# Patient Record
Sex: Female | Born: 1985 | Race: Black or African American | Hispanic: No | Marital: Married | Smoking: Never smoker
Health system: Southern US, Community
[De-identification: ages and names within clinical notes are randomized; demographics above are authoritative.]

## PROBLEM LIST (undated history)

## (undated) ENCOUNTER — Inpatient Hospital Stay (HOSPITAL_COMMUNITY): Payer: Self-pay

## (undated) DIAGNOSIS — Z8632 Personal history of gestational diabetes: Secondary | ICD-10-CM

## (undated) DIAGNOSIS — Z8759 Personal history of other complications of pregnancy, childbirth and the puerperium: Secondary | ICD-10-CM

## (undated) DIAGNOSIS — M1990 Unspecified osteoarthritis, unspecified site: Secondary | ICD-10-CM

## (undated) DIAGNOSIS — J45909 Unspecified asthma, uncomplicated: Secondary | ICD-10-CM

## (undated) DIAGNOSIS — K219 Gastro-esophageal reflux disease without esophagitis: Secondary | ICD-10-CM

## (undated) DIAGNOSIS — R51 Headache: Secondary | ICD-10-CM

## (undated) DIAGNOSIS — O139 Gestational [pregnancy-induced] hypertension without significant proteinuria, unspecified trimester: Secondary | ICD-10-CM

## (undated) DIAGNOSIS — J310 Chronic rhinitis: Secondary | ICD-10-CM

## (undated) DIAGNOSIS — R87629 Unspecified abnormal cytological findings in specimens from vagina: Secondary | ICD-10-CM

## (undated) DIAGNOSIS — J189 Pneumonia, unspecified organism: Secondary | ICD-10-CM

## (undated) DIAGNOSIS — R87619 Unspecified abnormal cytological findings in specimens from cervix uteri: Secondary | ICD-10-CM

## (undated) DIAGNOSIS — D649 Anemia, unspecified: Secondary | ICD-10-CM

## (undated) DIAGNOSIS — IMO0002 Reserved for concepts with insufficient information to code with codable children: Secondary | ICD-10-CM

## (undated) HISTORY — DX: Personal history of gestational diabetes: Z86.32

## (undated) HISTORY — DX: Unspecified asthma, uncomplicated: J45.909

## (undated) HISTORY — PX: ANKLE FRACTURE SURGERY: SHX122

## (undated) HISTORY — PX: WISDOM TOOTH EXTRACTION: SHX21

---

## 2007-02-04 ENCOUNTER — Ambulatory Visit (HOSPITAL_COMMUNITY): Admission: RE | Admit: 2007-02-04 | Discharge: 2007-02-04 | Payer: Self-pay | Admitting: Cardiology

## 2007-10-21 HISTORY — PX: SALPINGOOPHORECTOMY: SHX82

## 2007-10-21 HISTORY — PX: DILATION AND CURETTAGE OF UTERUS: SHX78

## 2012-10-28 LAB — OB RESULTS CONSOLE RPR: RPR: NONREACTIVE

## 2012-10-28 LAB — OB RESULTS CONSOLE ANTIBODY SCREEN: Antibody Screen: NEGATIVE

## 2012-10-28 LAB — OB RESULTS CONSOLE HEPATITIS B SURFACE ANTIGEN: Hepatitis B Surface Ag: NEGATIVE

## 2012-10-28 LAB — OB RESULTS CONSOLE RUBELLA ANTIBODY, IGM: Rubella: IMMUNE

## 2012-10-28 LAB — OB RESULTS CONSOLE HIV ANTIBODY (ROUTINE TESTING): HIV: NONREACTIVE

## 2012-10-28 LAB — OB RESULTS CONSOLE ABO/RH: RH Type: POSITIVE

## 2012-11-10 LAB — OB RESULTS CONSOLE ABO/RH: RH Type: POSITIVE

## 2012-11-10 LAB — OB RESULTS CONSOLE ANTIBODY SCREEN: Antibody Screen: NEGATIVE

## 2012-11-10 LAB — OB RESULTS CONSOLE HIV ANTIBODY (ROUTINE TESTING): HIV: NONREACTIVE

## 2012-11-10 LAB — OB RESULTS CONSOLE HEPATITIS B SURFACE ANTIGEN: Hepatitis B Surface Ag: NEGATIVE

## 2012-11-10 LAB — OB RESULTS CONSOLE GC/CHLAMYDIA
Chlamydia: NEGATIVE
Gonorrhea: NEGATIVE

## 2012-11-10 LAB — OB RESULTS CONSOLE RUBELLA ANTIBODY, IGM: Rubella: IMMUNE

## 2012-11-10 LAB — OB RESULTS CONSOLE HGB/HCT, BLOOD
HCT: 33 %
Hemoglobin: 11.1 g/dL

## 2012-11-10 LAB — OB RESULTS CONSOLE PLATELET COUNT: Platelets: 343 10*3/uL

## 2012-11-10 LAB — OB RESULTS CONSOLE RPR: RPR: NONREACTIVE

## 2013-03-07 LAB — OB RESULTS CONSOLE RPR: RPR: NONREACTIVE

## 2013-04-25 LAB — OB RESULTS CONSOLE GBS: GBS: NEGATIVE

## 2013-05-13 NOTE — H&P (Signed)
Sheri Malone is a 27 y.o. G2P0010 (prior ectopic treated surgically ) presenting for IOL due to gestational hypertension. Pt notes occasional contractions. Good fetal movement, No vaginal bleeding, not leaking fluid.  [facility] at [facility] since first trimester - gestational hypertension, first diagnosed at 37 wks w/ bp's 140-150/90-100; started on labetalol at 38 wks. PIH labs neg and fetal testing reassuring - obesity, 14# wt gain through preg - elevated DS at 156, nl 3 hr (1 abnormal at 1 hr- 194) - LSIL pap during preg, defer colpo to PP - GBS neg - fetal growth: 38 wks: 8'8, 93%, AFI 23- 99%   Prenatal Transfer Tool  Maternal Diabetes: No Genetic Screening: Normal Maternal Ultrasounds/Referrals: Normal Fetal Ultrasounds or other Referrals:  None Maternal Substance Abuse:  No Significant Maternal Medications:  None Significant Maternal Lab Results: None     OB History   No data available     No past medical history on file. No past surgical history on file. Family History: family history is not on file. Social History:  has no tobacco, alcohol, and drug history on file.  Review of Systems -      There were no vitals taken for this visit.  Physical Exam:  Gen: well appearing, no distress CV: RRR Pulm: CTAB Back: no CVAT Abd: gravid, NT, no RUQ pain LE:  edema, equal bilaterally, non-tender Toco:  FH: baseline , accelerations present, no deceleratons, 10 beat variability  Prenatal labs: ABO, Rh:  O+ Antibody:  neg Rubella:  immune RPR:   NR HBsAg:   neg HIV:   neg GBS:   neg 1 hr Glucola 156   Genetic screening nl NT, nl AFP Anatomy US nl   Assessment/Plan: 27 y.o. No obstetric history on file. at   Note skeletonized prior to admission, in anticipation of IOL, pt presented prior to planned induction. See H&P on date of service.   [name] A. 05/13/2013, 1:00 AM

## 2013-05-14 ENCOUNTER — Encounter (HOSPITAL_COMMUNITY): Payer: Self-pay | Admitting: *Deleted

## 2013-05-14 ENCOUNTER — Inpatient Hospital Stay (HOSPITAL_COMMUNITY)
Admission: AD | Admit: 2013-05-14 | Discharge: 2013-05-15 | Disposition: A | Discharge status: Home / Self Care | Payer: 59 | Source: Ambulatory Visit | Attending: Obstetrics and Gynecology | Admitting: Obstetrics and Gynecology

## 2013-05-14 DIAGNOSIS — G43909 Migraine, unspecified, not intractable, without status migrainosus: Secondary | ICD-10-CM | POA: Insufficient documentation

## 2013-05-14 DIAGNOSIS — O139 Gestational [pregnancy-induced] hypertension without significant proteinuria, unspecified trimester: Secondary | ICD-10-CM | POA: Insufficient documentation

## 2013-05-14 HISTORY — DX: Unspecified abnormal cytological findings in specimens from cervix uteri: R87.619

## 2013-05-14 HISTORY — DX: Headache: R51

## 2013-05-14 HISTORY — DX: Gestational (pregnancy-induced) hypertension without significant proteinuria, unspecified trimester: O13.9

## 2013-05-14 HISTORY — DX: Chronic rhinitis: J31.0

## 2013-05-14 HISTORY — DX: Personal history of other complications of pregnancy, childbirth and the puerperium: Z87.59

## 2013-05-14 HISTORY — DX: Anemia, unspecified: D64.9

## 2013-05-14 HISTORY — DX: Reserved for concepts with insufficient information to code with codable children: IMO0002

## 2013-05-14 HISTORY — DX: Gastro-esophageal reflux disease without esophagitis: K21.9

## 2013-05-14 LAB — CBC
HCT: 35.1 % — ABNORMAL LOW (ref 36.0–46.0)
Hemoglobin: 11.6 g/dL — ABNORMAL LOW (ref 12.0–15.0)
MCH: 26 pg (ref 26.0–34.0)
MCHC: 33 g/dL (ref 30.0–36.0)
MCV: 78.7 fL (ref 78.0–100.0)
Platelets: 248 10*3/uL (ref 150–400)
RBC: 4.46 MIL/uL (ref 3.87–5.11)
RDW: 14.9 % (ref 11.5–15.5)
WBC: 11 10*3/uL — ABNORMAL HIGH (ref 4.0–10.5)

## 2013-05-14 MED ORDER — HYDROCODONE-ACETAMINOPHEN 5-325 MG PO TABS
1.0000 | ORAL_TABLET | Freq: Once | ORAL | Status: AC
  Administered 2013-05-15: 1 via ORAL
  Filled 2013-05-14: qty 1

## 2013-05-14 NOTE — MAU Note (Signed)
Pt states she has had a headache since about 1430-took her pain med at 1430 with little relief-sttes she is on B/P meds also

## 2013-05-15 LAB — COMPREHENSIVE METABOLIC PANEL
ALT: 27 U/L (ref 0–35)
AST: 28 U/L (ref 0–37)
Albumin: 2.7 g/dL — ABNORMAL LOW (ref 3.5–5.2)
Alkaline Phosphatase: 53 U/L (ref 39–117)
BUN: 10 mg/dL (ref 6–23)
CO2: 23 mEq/L (ref 19–32)
Calcium: 9.7 mg/dL (ref 8.4–10.5)
Chloride: 98 mEq/L (ref 96–112)
Creatinine, Ser: 0.65 mg/dL (ref 0.50–1.10)
GFR calc Af Amer: >90 mL/min (ref >90)
GFR calc non Af Amer: >90 mL/min (ref >90)
Glucose, Bld: 95 mg/dL (ref 70–99)
Potassium: 3.6 mEq/L (ref 3.5–5.1)
Sodium: 134 mEq/L — ABNORMAL LOW (ref 135–145)
Total Bilirubin: 0.5 mg/dL (ref 0.3–1.2)
Total Protein: 6.1 g/dL (ref 6.0–8.3)

## 2013-05-15 LAB — URINE MICROSCOPIC-ADD ON

## 2013-05-15 LAB — URINALYSIS, ROUTINE W REFLEX MICROSCOPIC
Bilirubin Urine: NEGATIVE
Glucose, UA: NEGATIVE mg/dL
Ketones, ur: NEGATIVE mg/dL
Nitrite: NEGATIVE
Protein, ur: NEGATIVE mg/dL
Specific Gravity, Urine: <1.005 — ABNORMAL LOW (ref 1.005–1.030)
Urobilinogen, UA: 0.2 mg/dL (ref 0.0–1.0)
pH: 7 (ref 5.0–8.0)

## 2013-05-15 LAB — URIC ACID: Uric Acid, Serum: 4.6 mg/dL (ref 2.4–7.0)

## 2013-05-15 MED ORDER — BUTALBITAL-APAP-CAFFEINE 50-325-40 MG PO TABS
2.0000 | ORAL_TABLET | Freq: Once | ORAL | Status: AC
  Administered 2013-05-15: 2 via ORAL
  Filled 2013-05-15 (×2): qty 1

## 2013-05-15 NOTE — Progress Notes (Signed)
History     Chief Complaint  Patient presents with  . Headache  27 yo G2P0010 MBF now @ 38 4/[redacted] weeks gestation presents for evaluation of h/a ( 7/10) since earlier today. Pt took fioricet for h/a @ 2:30 pm. H/a went away and then returned. PNC complicated by recent diagnosis of gestational HTN w/ nl PIH labs and 24 hr urine protein collection( 111mg ). Pt notes light sensitivity. Pt has hx of migraine but not recently. She was placed on Procardia 10mg  TID for past three days. Pt was initially placed on labetalol but developed CP. No heartburn or RUQ pain (+) FM   OB History   Grav Para Term Preterm Abortions TAB SAB Ect Mult Living   2    1   1         Past Medical History  Diagnosis Date  . Hypertension   . Gestational HTN   . Low hemoglobin     on ironspan  . GERD (gastroesophageal reflux disease)   . URI (upper respiratory infection)   . Rhinitis   . Headache(784.0)   . Abnormal Pap smear   . History of ectopic pregnancy     Past Surgical History  Procedure Laterality Date  . Salpingoophorectomy  2009    left  . Wisdom tooth extraction      History reviewed. No pertinent family history.  History  Substance Use Topics  . Smoking status: Never Smoker   . Smokeless tobacco: Not on file  . Alcohol Use: No    Allergies: No Known Allergies  Prescriptions prior to admission  Medication Sig Dispense Refill  . butalbital-acetaminophen-caffeine (FIORICET, ESGIC) 50-325-40 MG per tablet Take 1 tablet by mouth 2 (two) times daily as needed for headache.      . doxylamine, Sleep, (UNISOM) 25 MG tablet Take 25 mg by mouth at bedtime as needed for sleep.      Marland Kitchen Fe-Succ Ac-B Cmplx-C-Ca-FA (IROSPAN 24/6 PO) Take by mouth.      . labetalol (NORMODYNE) 100 MG tablet Take 100 mg by mouth 2 (two) times daily.      Marland Kitchen NIFEdipine (PROCARDIA) 10 MG capsule Take 10 mg by mouth 3 (three) times daily.      . Prenatal Vit-Fe Fumarate-FA (MULTIVITAMIN-PRENATAL) 27-0.8 MG TABS Take 1 tablet  by mouth daily at 12 noon.      . ranitidine (ZANTAC) 150 MG capsule Take 150 mg by mouth 2 (two) times daily.         Physical Exam   Blood pressure 129/92, pulse 82, temperature 98.2 F (36.8 C), temperature source Oral, resp. rate 16, height 5' 3.5" (1.613 m), weight 95.255 kg (210 lb), SpO2 100.00%.  General appearance: alert, cooperative and mild distresss(lights are out in the room) Lungs: clear to auscultation bilaterally Heart: regular rate and rhythm, S1, S2 normal, no murmur, click, rub or gallop Abdomen: gravid nontender Extremities: edema +1 w/o clonus. DTR 2+ CBC    Component Value Date/Time   WBC 11.0* 05/14/2013 2349   RBC 4.46 05/14/2013 2349   HGB 11.6* 05/14/2013 2349   HCT 35.1* 05/14/2013 2349   PLT 248 05/14/2013 2349   MCV 78.7 05/14/2013 2349   MCH 26.0 05/14/2013 2349   MCHC 33.0 05/14/2013 2349   RDW 14.9 05/14/2013 2349   BMET    Component Value Date/Time   NA 134* 05/14/2013 2349   K 3.6 05/14/2013 2349   CL 98 05/14/2013 2349   CO2 23 05/14/2013 2349   GLUCOSE 95  05/14/2013 2349   BUN 10 05/14/2013 2349   CREATININE 0.65 05/14/2013 2349   CALCIUM 9.7 05/14/2013 2349   GFRNONAA >90 05/14/2013 2349   GFRAA >90 05/14/2013 2349  AST 28 Uric acid 4.6  Tracing: baseline 140 (+) accel reactive irreg ctx  min variability   ED Course   PIH w/ h/a r/o toxemia Migraine Gest HTN @ 38 4/7 weeks  P) Vicodin x1 given ( no response). PIH labs nl. Sx c/w migraine Will give fioricet x 2 tab Stop procardia ( h/a side effect) Keep sched OB appt. Preeclampsia warning signs  MDM   Demitri Kucinski A, MD 1:40 AM 05/15/2013

## 2013-05-16 LAB — URINE CULTURE: Colony Count: >=100000

## 2013-05-20 ENCOUNTER — Encounter (HOSPITAL_COMMUNITY): Payer: Self-pay | Admitting: *Deleted

## 2013-05-20 ENCOUNTER — Inpatient Hospital Stay (HOSPITAL_COMMUNITY): Payer: 59 | Admitting: Anesthesiology

## 2013-05-20 ENCOUNTER — Encounter (HOSPITAL_COMMUNITY)
Admission: AD | Disposition: A | Discharge status: Home / Self Care | Payer: Self-pay | Source: Ambulatory Visit | Attending: Obstetrics & Gynecology

## 2013-05-20 ENCOUNTER — Inpatient Hospital Stay (HOSPITAL_COMMUNITY)
Admission: AD | Admit: 2013-05-20 | Discharge: 2013-05-22 | DRG: 765 | Disposition: A | Discharge status: Home / Self Care | Payer: 59 | Source: Ambulatory Visit | Attending: Obstetrics & Gynecology | Admitting: Obstetrics & Gynecology

## 2013-05-20 ENCOUNTER — Encounter (HOSPITAL_COMMUNITY): Payer: Self-pay | Admitting: Anesthesiology

## 2013-05-20 DIAGNOSIS — D62 Acute posthemorrhagic anemia: Secondary | ICD-10-CM | POA: Diagnosis present

## 2013-05-20 DIAGNOSIS — O9902 Anemia complicating childbirth: Secondary | ICD-10-CM | POA: Diagnosis present

## 2013-05-20 DIAGNOSIS — O139 Gestational [pregnancy-induced] hypertension without significant proteinuria, unspecified trimester: Principal | ICD-10-CM | POA: Diagnosis present

## 2013-05-20 DIAGNOSIS — O3660X0 Maternal care for excessive fetal growth, unspecified trimester, not applicable or unspecified: Secondary | ICD-10-CM | POA: Diagnosis present

## 2013-05-20 DIAGNOSIS — O324XX0 Maternal care for high head at term, not applicable or unspecified: Secondary | ICD-10-CM | POA: Diagnosis present

## 2013-05-20 LAB — CBC
HCT: 35 % — ABNORMAL LOW (ref 36.0–46.0)
HCT: 31.7 % — ABNORMAL LOW (ref 36.0–46.0)
Hemoglobin: 11.7 g/dL — ABNORMAL LOW (ref 12.0–15.0)
Hemoglobin: 10.6 g/dL — ABNORMAL LOW (ref 12.0–15.0)
MCH: 26 pg (ref 26.0–34.0)
MCH: 26.1 pg (ref 26.0–34.0)
MCHC: 33.4 g/dL (ref 30.0–36.0)
MCHC: 33.4 g/dL (ref 30.0–36.0)
MCV: 77.8 fL — ABNORMAL LOW (ref 78.0–100.0)
MCV: 78.1 fL (ref 78.0–100.0)
Platelets: 223 10*3/uL (ref 150–400)
Platelets: 197 10*3/uL (ref 150–400)
RBC: 4.5 MIL/uL (ref 3.87–5.11)
RBC: 4.06 MIL/uL (ref 3.87–5.11)
RDW: 14.4 % (ref 11.5–15.5)
RDW: 14.4 % (ref 11.5–15.5)
WBC: 10.4 10*3/uL (ref 4.0–10.5)
WBC: 12.1 10*3/uL — ABNORMAL HIGH (ref 4.0–10.5)

## 2013-05-20 LAB — URINALYSIS, ROUTINE W REFLEX MICROSCOPIC
Bilirubin Urine: NEGATIVE
Glucose, UA: NEGATIVE mg/dL
Ketones, ur: 40 mg/dL — AB
Nitrite: NEGATIVE
Protein, ur: NEGATIVE mg/dL
Specific Gravity, Urine: <1.005 — ABNORMAL LOW (ref 1.005–1.030)
Urobilinogen, UA: 0.2 mg/dL (ref 0.0–1.0)
pH: 7 (ref 5.0–8.0)

## 2013-05-20 LAB — URINE MICROSCOPIC-ADD ON

## 2013-05-20 LAB — COMPREHENSIVE METABOLIC PANEL
ALT: 26 U/L (ref 0–35)
AST: 36 U/L (ref 0–37)
Albumin: 2.7 g/dL — ABNORMAL LOW (ref 3.5–5.2)
Alkaline Phosphatase: 57 U/L (ref 39–117)
BUN: 10 mg/dL (ref 6–23)
CO2: 20 mEq/L (ref 19–32)
Calcium: 9.8 mg/dL (ref 8.4–10.5)
Chloride: 99 mEq/L (ref 96–112)
Creatinine, Ser: 0.7 mg/dL (ref 0.50–1.10)
GFR calc Af Amer: >90 mL/min (ref >90)
GFR calc non Af Amer: >90 mL/min (ref >90)
Glucose, Bld: 85 mg/dL (ref 70–99)
Potassium: 3.6 mEq/L (ref 3.5–5.1)
Sodium: 132 mEq/L — ABNORMAL LOW (ref 135–145)
Total Bilirubin: 0.4 mg/dL (ref 0.3–1.2)
Total Protein: 5.9 g/dL — ABNORMAL LOW (ref 6.0–8.3)

## 2013-05-20 LAB — LACTATE DEHYDROGENASE: LDH: 223 U/L (ref 94–250)

## 2013-05-20 LAB — ABO/RH: ABO/RH(D): O POS

## 2013-05-20 LAB — TYPE AND SCREEN
ABO/RH(D): O POS
Antibody Screen: NEGATIVE

## 2013-05-20 LAB — URIC ACID: Uric Acid, Serum: 5.6 mg/dL (ref 2.4–7.0)

## 2013-05-20 LAB — RPR: RPR Ser Ql: NONREACTIVE

## 2013-05-20 SURGERY — Surgical Case
Anesthesia: Epidural | Site: Abdomen | Wound class: Clean Contaminated

## 2013-05-20 MED ORDER — DIPHENHYDRAMINE HCL 50 MG/ML IJ SOLN: 12.5000 mg | INTRAMUSCULAR | Status: DC | PRN

## 2013-05-20 MED ORDER — ACETAMINOPHEN 325 MG PO TABS: 650.0000 mg | ORAL_TABLET | ORAL | Status: DC | PRN

## 2013-05-20 MED ORDER — ONDANSETRON HCL 4 MG/2ML IJ SOLN
INTRAMUSCULAR | Status: AC
  Filled 2013-05-20: qty 2

## 2013-05-20 MED ORDER — SODIUM BICARBONATE 8.4 % IV SOLN
INTRAVENOUS | Status: DC | PRN
  Administered 2013-05-20: 5 mL via EPIDURAL

## 2013-05-20 MED ORDER — KETOROLAC TROMETHAMINE 30 MG/ML IJ SOLN
30.0000 mg | Freq: Four times a day (QID) | INTRAMUSCULAR | Status: DC | PRN
  Administered 2013-05-20: 30 mg via INTRAMUSCULAR

## 2013-05-20 MED ORDER — CALCIUM CARBONATE ANTACID 500 MG PO CHEW
1.0000 | CHEWABLE_TABLET | Freq: Four times a day (QID) | ORAL | Status: DC | PRN
  Administered 2013-05-20 – 2013-05-21 (×2): 200 mg via ORAL
  Filled 2013-05-20: qty 1

## 2013-05-20 MED ORDER — NALOXONE HCL 0.4 MG/ML IJ SOLN: 0.4000 mg | INTRAMUSCULAR | Status: DC | PRN

## 2013-05-20 MED ORDER — LACTATED RINGERS IV SOLN
INTRAVENOUS | Status: DC
  Administered 2013-05-20: 125 mL/h via INTRAVENOUS
  Administered 2013-05-20 (×2): via INTRAVENOUS

## 2013-05-20 MED ORDER — SODIUM BICARBONATE 8.4 % IV SOLN
INTRAVENOUS | Status: AC
  Filled 2013-05-20: qty 50

## 2013-05-20 MED ORDER — OXYTOCIN 40 UNITS IN LACTATED RINGERS INFUSION - SIMPLE MED
1.0000 m[IU]/min | INTRAVENOUS | Status: DC
  Administered 2013-05-20: 1 m[IU]/min via INTRAVENOUS
  Filled 2013-05-20: qty 1000

## 2013-05-20 MED ORDER — ZOLPIDEM TARTRATE 5 MG PO TABS: 5.0000 mg | ORAL_TABLET | Freq: Every evening | ORAL | Status: DC | PRN

## 2013-05-20 MED ORDER — METOCLOPRAMIDE HCL 5 MG/ML IJ SOLN: 10.0000 mg | Freq: Three times a day (TID) | INTRAMUSCULAR | Status: DC | PRN

## 2013-05-20 MED ORDER — PHENYLEPHRINE HCL 10 MG/ML IJ SOLN
INTRAMUSCULAR | Status: DC | PRN
  Administered 2013-05-20: 80 ug via INTRAVENOUS

## 2013-05-20 MED ORDER — SIMETHICONE 80 MG PO CHEW
80.0000 mg | CHEWABLE_TABLET | Freq: Three times a day (TID) | ORAL | Status: DC
  Administered 2013-05-20 – 2013-05-22 (×7): 80 mg via ORAL

## 2013-05-20 MED ORDER — EPHEDRINE 5 MG/ML INJ: 10.0000 mg | INTRAVENOUS | Status: DC | PRN

## 2013-05-20 MED ORDER — ONDANSETRON HCL 4 MG/2ML IJ SOLN: 4.0000 mg | INTRAMUSCULAR | Status: DC | PRN

## 2013-05-20 MED ORDER — CARBOPROST TROMETHAMINE 250 MCG/ML IM SOLN
INTRAMUSCULAR | Status: DC | PRN
  Administered 2013-05-20: 250 ug via INTRAMUSCULAR

## 2013-05-20 MED ORDER — OXYCODONE-ACETAMINOPHEN 5-325 MG PO TABS
1.0000 | ORAL_TABLET | ORAL | Status: DC | PRN
  Administered 2013-05-21 – 2013-05-22 (×6): 2 via ORAL
  Filled 2013-05-20 (×6): qty 2

## 2013-05-20 MED ORDER — CALCIUM CARBONATE ANTACID 500 MG PO CHEW
2.0000 | CHEWABLE_TABLET | Freq: Three times a day (TID) | ORAL | Status: DC
  Administered 2013-05-20: 400 mg via ORAL
  Filled 2013-05-20: qty 2

## 2013-05-20 MED ORDER — MORPHINE SULFATE (PF) 0.5 MG/ML IJ SOLN
INTRAMUSCULAR | Status: DC | PRN
  Administered 2013-05-20: 4 mg via EPIDURAL

## 2013-05-20 MED ORDER — WITCH HAZEL-GLYCERIN EX PADS: 1.0000 "application " | MEDICATED_PAD | CUTANEOUS | Status: DC | PRN

## 2013-05-20 MED ORDER — OXYTOCIN 40 UNITS IN LACTATED RINGERS INFUSION - SIMPLE MED: 62.5000 mL/h | INTRAVENOUS | Status: DC

## 2013-05-20 MED ORDER — KETOROLAC TROMETHAMINE 30 MG/ML IJ SOLN: 30.0000 mg | Freq: Four times a day (QID) | INTRAMUSCULAR | Status: DC | PRN

## 2013-05-20 MED ORDER — SCOPOLAMINE 1 MG/3DAYS TD PT72
1.0000 | MEDICATED_PATCH | Freq: Once | TRANSDERMAL | Status: DC
  Administered 2013-05-20: 1.5 mg via TRANSDERMAL
  Filled 2013-05-20 (×2): qty 1

## 2013-05-20 MED ORDER — NALBUPHINE HCL 10 MG/ML IJ SOLN
5.0000 mg | INTRAMUSCULAR | Status: DC | PRN
  Filled 2013-05-20: qty 1

## 2013-05-20 MED ORDER — LIDOCAINE HCL (PF) 1 % IJ SOLN
INTRAMUSCULAR | Status: DC | PRN
  Administered 2013-05-20 (×2): 5 mL

## 2013-05-20 MED ORDER — LANOLIN HYDROUS EX OINT: 1.0000 "application " | TOPICAL_OINTMENT | CUTANEOUS | Status: DC | PRN

## 2013-05-20 MED ORDER — DIPHENHYDRAMINE HCL 25 MG PO CAPS: 25.0000 mg | ORAL_CAPSULE | ORAL | Status: DC | PRN

## 2013-05-20 MED ORDER — FLEET ENEMA 7-19 GM/118ML RE ENEM: 1.0000 | ENEMA | RECTAL | Status: DC | PRN

## 2013-05-20 MED ORDER — PHENYLEPHRINE 40 MCG/ML (10ML) SYRINGE FOR IV PUSH (FOR BLOOD PRESSURE SUPPORT)
PREFILLED_SYRINGE | INTRAVENOUS | Status: AC
  Filled 2013-05-20: qty 5

## 2013-05-20 MED ORDER — LACTATED RINGERS IV SOLN
INTRAVENOUS | Status: DC
  Administered 2013-05-20: 19:00:00 via INTRAVENOUS

## 2013-05-20 MED ORDER — MORPHINE SULFATE 0.5 MG/ML IJ SOLN
INTRAMUSCULAR | Status: AC
  Filled 2013-05-20: qty 10

## 2013-05-20 MED ORDER — IBUPROFEN 600 MG PO TABS
600.0000 mg | ORAL_TABLET | Freq: Four times a day (QID) | ORAL | Status: DC
  Administered 2013-05-20 – 2013-05-22 (×6): 600 mg via ORAL
  Filled 2013-05-20 (×6): qty 1

## 2013-05-20 MED ORDER — PHENYLEPHRINE 40 MCG/ML (10ML) SYRINGE FOR IV PUSH (FOR BLOOD PRESSURE SUPPORT)
80.0000 ug | PREFILLED_SYRINGE | INTRAVENOUS | Status: DC | PRN
  Filled 2013-05-20: qty 5

## 2013-05-20 MED ORDER — LACTATED RINGERS IV SOLN
500.0000 mL | Freq: Once | INTRAVENOUS | Status: AC
  Administered 2013-05-20: 300 mL via INTRAVENOUS

## 2013-05-20 MED ORDER — LIDOCAINE-EPINEPHRINE (PF) 2 %-1:200000 IJ SOLN
INTRAMUSCULAR | Status: AC
  Filled 2013-05-20: qty 20

## 2013-05-20 MED ORDER — ONDANSETRON HCL 4 MG/2ML IJ SOLN: 4.0000 mg | Freq: Four times a day (QID) | INTRAMUSCULAR | Status: DC | PRN

## 2013-05-20 MED ORDER — CITRIC ACID-SODIUM CITRATE 334-500 MG/5ML PO SOLN
30.0000 mL | ORAL | Status: DC | PRN
  Administered 2013-05-20 (×2): 30 mL via ORAL
  Filled 2013-05-20 (×2): qty 15

## 2013-05-20 MED ORDER — IBUPROFEN 600 MG PO TABS: 600.0000 mg | ORAL_TABLET | Freq: Four times a day (QID) | ORAL | Status: DC | PRN

## 2013-05-20 MED ORDER — OXYCODONE-ACETAMINOPHEN 5-325 MG PO TABS: 1.0000 | ORAL_TABLET | ORAL | Status: DC | PRN

## 2013-05-20 MED ORDER — PRENATAL MULTIVITAMIN CH
1.0000 | ORAL_TABLET | Freq: Every day | ORAL | Status: DC
  Administered 2013-05-21 – 2013-05-22 (×2): 1 via ORAL
  Filled 2013-05-20 (×2): qty 1

## 2013-05-20 MED ORDER — ONDANSETRON HCL 4 MG/2ML IJ SOLN
INTRAMUSCULAR | Status: DC | PRN
  Administered 2013-05-20: 4 mg via INTRAVENOUS

## 2013-05-20 MED ORDER — SODIUM CHLORIDE 0.9 % IJ SOLN: 3.0000 mL | INTRAMUSCULAR | Status: DC | PRN

## 2013-05-20 MED ORDER — CEFAZOLIN SODIUM-DEXTROSE 2-3 GM-% IV SOLR
INTRAVENOUS | Status: DC | PRN
  Administered 2013-05-20: 2 g via INTRAVENOUS

## 2013-05-20 MED ORDER — CEFAZOLIN SODIUM-DEXTROSE 2-3 GM-% IV SOLR
INTRAVENOUS | Status: AC
  Filled 2013-05-20: qty 50

## 2013-05-20 MED ORDER — LACTATED RINGERS IV SOLN: 500.0000 mL | INTRAVENOUS | Status: DC | PRN

## 2013-05-20 MED ORDER — OXYTOCIN 10 UNIT/ML IJ SOLN
INTRAMUSCULAR | Status: AC
  Filled 2013-05-20: qty 4

## 2013-05-20 MED ORDER — MEPERIDINE HCL 25 MG/ML IJ SOLN: 6.2500 mg | INTRAMUSCULAR | Status: DC | PRN

## 2013-05-20 MED ORDER — NALOXONE HCL 1 MG/ML IJ SOLN
1.0000 ug/kg/h | INTRAVENOUS | Status: DC | PRN
  Filled 2013-05-20: qty 2

## 2013-05-20 MED ORDER — ONDANSETRON HCL 4 MG/2ML IJ SOLN: 4.0000 mg | Freq: Three times a day (TID) | INTRAMUSCULAR | Status: DC | PRN

## 2013-05-20 MED ORDER — MENTHOL 3 MG MT LOZG: 1.0000 | LOZENGE | OROMUCOSAL | Status: DC | PRN

## 2013-05-20 MED ORDER — FENTANYL 2.5 MCG/ML BUPIVACAINE 1/10 % EPIDURAL INFUSION (WH - ANES)
14.0000 mL/h | INTRAMUSCULAR | Status: DC | PRN
  Administered 2013-05-20 (×2): 14 mL/h via EPIDURAL
  Filled 2013-05-20 (×2): qty 125

## 2013-05-20 MED ORDER — SIMETHICONE 80 MG PO CHEW: 80.0000 mg | CHEWABLE_TABLET | ORAL | Status: DC | PRN

## 2013-05-20 MED ORDER — FENTANYL CITRATE 0.05 MG/ML IJ SOLN
25.0000 ug | INTRAMUSCULAR | Status: DC | PRN
  Administered 2013-05-20: 25 ug via INTRAVENOUS
  Administered 2013-05-20: 50 ug via INTRAVENOUS
  Administered 2013-05-20: 25 ug via INTRAVENOUS

## 2013-05-20 MED ORDER — PHENYLEPHRINE 40 MCG/ML (10ML) SYRINGE FOR IV PUSH (FOR BLOOD PRESSURE SUPPORT): 80.0000 ug | PREFILLED_SYRINGE | INTRAVENOUS | Status: DC | PRN

## 2013-05-20 MED ORDER — LIDOCAINE HCL (PF) 1 % IJ SOLN: 30.0000 mL | INTRAMUSCULAR | Status: DC | PRN

## 2013-05-20 MED ORDER — SENNOSIDES-DOCUSATE SODIUM 8.6-50 MG PO TABS
2.0000 | ORAL_TABLET | Freq: Every day | ORAL | Status: DC
  Administered 2013-05-20 – 2013-05-21 (×2): 2 via ORAL

## 2013-05-20 MED ORDER — DIPHENHYDRAMINE HCL 50 MG/ML IJ SOLN: 25.0000 mg | INTRAMUSCULAR | Status: DC | PRN

## 2013-05-20 MED ORDER — CARBOPROST TROMETHAMINE 250 MCG/ML IM SOLN
INTRAMUSCULAR | Status: AC
  Filled 2013-05-20: qty 1

## 2013-05-20 MED ORDER — ONDANSETRON HCL 4 MG PO TABS: 4.0000 mg | ORAL_TABLET | ORAL | Status: DC | PRN

## 2013-05-20 MED ORDER — DIBUCAINE 1 % RE OINT: 1.0000 "application " | TOPICAL_OINTMENT | RECTAL | Status: DC | PRN

## 2013-05-20 MED ORDER — EPHEDRINE 5 MG/ML INJ
10.0000 mg | INTRAVENOUS | Status: DC | PRN
  Filled 2013-05-20: qty 4

## 2013-05-20 MED ORDER — OXYTOCIN 40 UNITS IN LACTATED RINGERS INFUSION - SIMPLE MED: 62.5000 mL/h | INTRAVENOUS | Status: AC

## 2013-05-20 MED ORDER — DIPHENHYDRAMINE HCL 25 MG PO CAPS: 25.0000 mg | ORAL_CAPSULE | Freq: Four times a day (QID) | ORAL | Status: DC | PRN

## 2013-05-20 MED ORDER — TERBUTALINE SULFATE 1 MG/ML IJ SOLN: 0.2500 mg | Freq: Once | INTRAMUSCULAR | Status: DC | PRN

## 2013-05-20 MED ORDER — TETANUS-DIPHTH-ACELL PERTUSSIS 5-2.5-18.5 LF-MCG/0.5 IM SUSP: 0.5000 mL | Freq: Once | INTRAMUSCULAR | Status: DC

## 2013-05-20 MED ORDER — OXYTOCIN 10 UNIT/ML IJ SOLN
40.0000 [IU] | INTRAVENOUS | Status: DC | PRN
  Administered 2013-05-20: 40 [IU] via INTRAVENOUS

## 2013-05-20 MED ORDER — OXYTOCIN BOLUS FROM INFUSION: 500.0000 mL | INTRAVENOUS | Status: DC

## 2013-05-20 SURGICAL SUPPLY — 36 items
CLAMP CORD UMBIL (MISCELLANEOUS) ×2 IMPLANT
CLOTH BEACON ORANGE TIMEOUT ST (SAFETY) ×2 IMPLANT
CONTAINER PREFILL 10% NBF 15ML (MISCELLANEOUS) IMPLANT
DRAPE LG THREE QUARTER DISP (DRAPES) ×2 IMPLANT
DRSG OPSITE POSTOP 4X10 (GAUZE/BANDAGES/DRESSINGS) ×2 IMPLANT
DURAPREP 26ML APPLICATOR (WOUND CARE) ×2 IMPLANT
ELECT REM PT RETURN 9FT ADLT (ELECTROSURGICAL) ×2
ELECTRODE REM PT RTRN 9FT ADLT (ELECTROSURGICAL) ×1 IMPLANT
EXTRACTOR VACUUM KIWI (MISCELLANEOUS) ×2 IMPLANT
EXTRACTOR VACUUM M CUP 4 TUBE (SUCTIONS) IMPLANT
GLOVE BIO SURGEON STRL SZ 6.5 (GLOVE) ×2 IMPLANT
GLOVE BIOGEL PI IND STRL 7.0 (GLOVE) ×1 IMPLANT
GLOVE BIOGEL PI INDICATOR 7.0 (GLOVE) ×1
GOWN STRL REIN XL XLG (GOWN DISPOSABLE) ×4 IMPLANT
KIT ABG SYR 3ML LUER SLIP (SYRINGE) IMPLANT
NEEDLE HYPO 22GX1.5 SAFETY (NEEDLE) ×2 IMPLANT
NEEDLE HYPO 25X5/8 SAFETYGLIDE (NEEDLE) ×2 IMPLANT
PACK C SECTION WH (CUSTOM PROCEDURE TRAY) ×2 IMPLANT
PAD ABD 7.5X8 STRL (GAUZE/BANDAGES/DRESSINGS) ×2 IMPLANT
PAD OB MATERNITY 4.3X12.25 (PERSONAL CARE ITEMS) ×2 IMPLANT
RTRCTR C-SECT PINK 25CM LRG (MISCELLANEOUS) IMPLANT
STAPLER VISISTAT 35W (STAPLE) IMPLANT
SUT PLAIN 0 NONE (SUTURE) IMPLANT
SUT VIC AB 0 CT1 27 (SUTURE) ×2
SUT VIC AB 0 CT1 27XBRD ANBCTR (SUTURE) ×2 IMPLANT
SUT VIC AB 0 CTX 36 (SUTURE) ×2
SUT VIC AB 0 CTX36XBRD ANBCTRL (SUTURE) ×2 IMPLANT
SUT VIC AB 2-0 CT1 27 (SUTURE) ×1
SUT VIC AB 2-0 CT1 TAPERPNT 27 (SUTURE) ×1 IMPLANT
SUT VIC AB 3-0 SH 27 (SUTURE)
SUT VIC AB 3-0 SH 27X BRD (SUTURE) IMPLANT
SUT VIC AB 4-0 KS 27 (SUTURE) ×2 IMPLANT
SYR CONTROL 10ML LL (SYRINGE) ×2 IMPLANT
TOWEL OR 17X24 6PK STRL BLUE (TOWEL DISPOSABLE) ×6 IMPLANT
TRAY FOLEY CATH 14FR (SET/KITS/TRAYS/PACK) IMPLANT
WATER STERILE IRR 1000ML POUR (IV SOLUTION) IMPLANT

## 2013-05-20 NOTE — Anesthesia Procedure Notes (Signed)
Epidural Patient location during procedure: OB Start time: 05/20/2013 3:42 AM  Staffing Anesthesiologist: Angus Seller., Harrell Gave. Performed by: anesthesiologist   Preanesthetic Checklist Completed: patient identified, site marked, surgical consent, pre-op evaluation, timeout performed, IV checked, risks and benefits discussed and monitors and equipment checked  Epidural Patient position: sitting Prep: site prepped and draped and DuraPrep Patient monitoring: continuous pulse ox and blood pressure Approach: midline Injection technique: LOR air and LOR saline  Needle:  Needle type: Tuohy  Needle gauge: 17 G Needle length: 9 cm and 9 Needle insertion depth: 7 cm Catheter type: closed end flexible Catheter size: 19 Gauge Catheter at skin depth: 12 cm Test dose: negative  Assessment Events: blood not aspirated, injection not painful, no injection resistance, negative IV test and no paresthesia  Additional Notes Patient identified.  Risk benefits discussed including failed block, incomplete pain control, headache, nerve damage, paralysis, blood pressure changes, nausea, vomiting, reactions to medication both toxic or allergic, and postpartum back pain.  Patient expressed understanding and wished to proceed.  All questions were answered.  Sterile technique used throughout procedure and epidural site dressed with sterile barrier dressing. No paresthesia or other complications noted.The patient did not experience any signs of intravascular injection such as tinnitus or metallic taste in mouth nor signs of intrathecal spread such as rapid motor block. Please see nursing notes for vital signs.

## 2013-05-20 NOTE — Anesthesia Postprocedure Evaluation (Signed)
  Anesthesia Post-op Note  Patient: Sheri Malone  Procedure(s) Performed: Procedure(s) with comments: CESAREAN SECTION (N/A) - primary  Patient Location: PACU  Anesthesia Type:Epidural  Level of Consciousness: awake, alert  and oriented  Airway and Oxygen Therapy: Patient Spontanous Breathing  Post-op Pain: none  Post-op Assessment: Post-op Vital signs reviewed, Patient's Cardiovascular Status Stable, Respiratory Function Stable, Patent Airway, No signs of Nausea or vomiting and Pain level controlled  Post-op Vital Signs: Reviewed and stable  Complications: No apparent anesthesia complications

## 2013-05-20 NOTE — H&P (Signed)
Subjective:  Sheri Malone is a 27 y.o. G2 P0 female with EDC 05/25/13 at 68 and 2/[redacted] weeks gestation who is being admitted for labor management.  Her current obstetrical history is significant for gestational HTN, no Rx.  Patient reports no complaints.  No PIH Sx.  Fetal Movement: normal.     Objective:   Vital signs in last 24 hours: Temp:  [98.2 F (36.8 C)-98.5 F (36.9 C)] 98.2 F (36.8 C) (08/01 0500) Pulse Rate:  [89-126] 112 (08/01 0901) Resp:  [16-20] 20 (08/01 0901) BP: (95-140)/(60-94) 138/90 mmHg (08/01 0901) SpO2:  [98 %-100 %] 99 % (08/01 0415) Weight:  [95.709 kg (211 lb)] 95.709 kg (211 lb) (08/01 0153)   General:   alert  Skin:   normal  HEENT:  PERRLA  Lungs:   clear to auscultation bilaterally  Heart:   regular rate and rhythm  Breasts:   Deferred  Abdomen:  Gravid  Pelvis:  Exam deferred.  FHT:  140's BPM  Accelerations and variability present, no deceleration  Uterine Size: size greater than dates  Presentations: cephalic  Cervix:    Dilation: 5 cm   Effacement: 85%   Station:  -3   Consistency: soft   Position: posterior                                AROM liquid meconium, FHR internal monitoring applied. Lab Review  O, Rh+  Ultrascreen wnl, AFP:NML, [name] anato wnl, placenta previa resolved in 3rd trimester.  One hour GTT: elevated with glucose intolerance on 3 hour GTT   GBS neg  [name] 38+ wks EFW 93%, AC 99%   Assessment/Plan:  39 and 2/[redacted] weeks gestation.  Fetal well-being reassuring, but liquid meconium, internal FHR monitoring in place. Active phase labor with high presentation and suspicion of macrosomia. Obstetrical history significant for PIH, no evidence of PEC, labs wnl, glucose intolerance.     Risks, benefits, alternatives and possible complications have been discussed in detail with the patient.  Pre-admission, admission, and post admission procedures and expectations were discussed in detail.  All questions answered, all appropriate consents  will be signed at the Hospital. Admission is planned for today.  Expectant management., Augmentation: IV Pitocin augmentation. and high risk of urgent C/S discussed given suspicion of macrosomia with high presentation.

## 2013-05-20 NOTE — OR Nursing (Signed)
Placed an order for cord blood evaluation.

## 2013-05-20 NOTE — Progress Notes (Signed)
Subjective: Doing well, pain controled with epidural, UCs q2 min on Pitocin  Anesthesia epidural   Objective: BP 131/94  Pulse 95  Temp(Src) 98.7 F (37.1 C) (Oral)  Resp 20  Ht 5\' 3"  (1.6 m)  Wt 95.709 kg (211 lb)  BMI 37.39 kg/m2  SpO2 99%   FHT:  FHR: 140 bpm, variability: moderate,  accelerations:  Present,  decelerations:  Absent UC:   regular, every 2 minutes VE:   Dilation: 5 Effacement (%): 80 Station: -3 Exam by:: Edana Aguado  Still very high presentation, no change in dilation   Assessment / Plan: Arrest in active phase of labor, no progression of dilation, very high presentation out of pelvis  Fetal Wellbeing:  Category I Pain Control:  Epidural  Anticipated MOD:  Decision to proceed with urgent primary C/S.  Surgery and risks reviewed including trauma, hemorrhage, infection, anesthesia.  Joab Carden,MARIE-LYNE 05/20/2013, 12:38 PM

## 2013-05-20 NOTE — Anesthesia Postprocedure Evaluation (Signed)
Anesthesia Post Note  Patient: Sheri Malone  Procedure(s) Performed: Procedure(s) (LRB): CESAREAN SECTION (N/A)  Anesthesia type: Epidural  Patient location: Mother/Baby  Post pain: Pain level controlled  Post assessment: Post-op Vital signs reviewed  Last Vitals:  Filed Vitals:   05/20/13 1639  BP: 130/82  Pulse: 83  Temp: 36.8 C  Resp: 18    Post vital signs: Reviewed  Level of consciousness: awake  Complications: No apparent anesthesia complications Anesthesia Post-op Note  Patient: Sheri Malone  Procedure(s) Performed: Procedure(s) with comments: CESAREAN SECTION (N/A) - primary  Patient Location: PACU and Mother/Baby  Anesthesia Type:Epidural  Level of Consciousness: awake, alert , oriented and patient cooperative  Airway and Oxygen Therapy: Patient Spontanous Breathing  Post-op Pain: none  Post-op Assessment: Post-op Vital signs reviewed  Post-op Vital Signs: Reviewed and stable  Complications: No apparent anesthesia complications

## 2013-05-20 NOTE — Anesthesia Preprocedure Evaluation (Addendum)
Anesthesia Evaluation  Patient identified by MRN, date of birth, ID band Patient awake    Reviewed: Allergy & Precautions, H&P , Patient's Chart, lab work & pertinent test results  Airway Mallampati: III TM Distance: >3 FB Neck ROM: full    Dental no notable dental hx.    Pulmonary neg pulmonary ROS,  breath sounds clear to auscultation  Pulmonary exam normal       Cardiovascular hypertension, negative cardio ROS  Rhythm:regular Rate:Normal     Neuro/Psych  Headaches, negative neurological ROS  negative psych ROS   GI/Hepatic negative GI ROS, Neg liver ROS, GERD-  ,  Endo/Other  negative endocrine ROSMorbid obesity  Renal/GU negative Renal ROS     Musculoskeletal   Abdominal   Peds  Hematology negative hematology ROS (+)   Anesthesia Other Findings Hypertension     Gestational HTN        Low hemoglobin   on ironspan GERD (gastroesophageal reflux disease)        URI (upper respiratory infection)     Rhinitis        Headache(784.0)     Abnormal Pap smear        History of ectopic pregnancy        Reproductive/Obstetrics (+) Pregnancy                           Anesthesia Physical Anesthesia Plan  ASA: III and emergent  Anesthesia Plan: Epidural   Post-op Pain Management:    Induction:   Airway Management Planned: Natural Airway  Additional Equipment:   Intra-op Plan:   Post-operative Plan:   Informed Consent: I have reviewed the patients History and Physical, chart, labs and discussed the procedure including the risks, benefits and alternatives for the proposed anesthesia with the patient or authorized representative who has indicated his/her understanding and acceptance.   Dental advisory given  Plan Discussed with: Anesthesiologist, CRNA and Surgeon  Anesthesia Plan Comments: (Patient for C/Section for non reassuring FHR tracing and failure to progress.)        Anesthesia Quick Evaluation

## 2013-05-20 NOTE — MAU Note (Signed)
contractions 

## 2013-05-20 NOTE — Op Note (Addendum)
Cesarean Section Procedure Note Sheri Malone 05/20/2013  Indications: Gestational HTN, 39.2 wks in labor with Arrest of dilatation and descent, suspected macrosomia  Procedure Note:  Primary Low Transverse Cesarean Section   Pre-operative Diagnosis: Arrest of dilatation and descent.   Post-operative Diagnosis: Same   Surgeon: Shea Evans, MD  Primary   Assistants: Alphonzo Severance, MD   Anesthesia: epidural   Procedure Details:  The patient was seen in the Labor Room. The risks, benefits, complications, treatment options, and expected outcomes were discussed with the patient. The patient concurred with the proposed plan, giving informed consent. identified as Sheri Malone and the procedure verified as C-Section Delivery. A Time Out was held and the above information confirmed.  2 gm Ancef given. After induction of anesthesia, the patient was draped and prepped in the usual sterile manner. A Pfannenstiel incision was made and carried down through the subcutaneous tissue to the fascia. Fascial incision was made and extended transversely. The fascia was separated from the underlying rectus tissue superiorly and inferiorly. The peritoneum was identified and entered. Peritoneal incision was extended longitudinally. The utero-vesical peritoneal reflection was incised transversely and the bladder flap was bluntly freed from the lower uterine segment. A low transverse uterine incision was made. Delivered from cephalic ROT presentation was a healthy vigorous Female infant with one Kiwi vacuum pull as there was some incisional dystocia. Nose and mouth bulb suctioned, cord clamped and cut and baby handed to NICU team. Apgar scores of 9 at one minute and 9 at five minutes. Cord ph was not sent and cord blood was obtained for evaluation. The placenta was removed Intact and appeared normal. The uterine outline, tubes and ovaries appeared normal. Uterus was atonic, massage and pitocin didn't improve the tone,  hence Hemabate 0.25 mg given IM which improved uterine tone. The uterine incision was closed with running locked sutures of 0 Monocryl.   Hemostasis was observed. Peritoneal closure done with 2-0 Vicryl. Pyramidalis approximated in midline. The fascia was then reapproximated with running sutures of 0Vicryl. The subcuticular closure was performed using 4-0Vicryl. Sterile pressure dressing placed.   Instrument, sponge, and needle counts were correct prior the abdominal closure and were correct at the conclusion of the case.   Findings: Female infant delivered from Cephalic ROT position with one Kiwi vacuum pull due to tight incision and large baby. Born at 1.13 pm, Apgars 9 and 9 at , 5 min. Clear amniotic fluid at birth. Normal tubes and ovaries. Uterine atony treated with one inj.IM Hemabate. Placenta normal, 3 vessel cord. Baby weight 8 lb 12.6 oz.   Estimated Blood Loss: 1200 cc  Total IV Fluids: 1700 cc LR  Urine Output: 200 CC OF clear urine  Specimens: Cord blood   Complications: no complications  Disposition: PACU - hemodynamically stable.   Maternal Condition: stable   Baby condition / location:  nursery-stable  Attending Attestation: I performed the procedure.   Signed: V.Zollie Clemence, MD

## 2013-05-20 NOTE — Transfer of Care (Signed)
Immediate Anesthesia Transfer of Care Note  Patient: Sheri Malone  Procedure(s) Performed: Procedure(s) with comments: CESAREAN SECTION (N/A) - primary  Patient Location: PACU  Anesthesia Type:Epidural  Level of Consciousness: awake  Airway & Oxygen Therapy: Patient Spontanous Breathing  Post-op Assessment: Report given to PACU RN  Post vital signs: Reviewed and stable  Complications: No apparent anesthesia complications

## 2013-05-21 LAB — CBC
HCT: 28.3 % — ABNORMAL LOW (ref 36.0–46.0)
Hemoglobin: 9.5 g/dL — ABNORMAL LOW (ref 12.0–15.0)
MCH: 26.5 pg (ref 26.0–34.0)
MCHC: 33.6 g/dL (ref 30.0–36.0)
MCV: 78.8 fL (ref 78.0–100.0)
Platelets: 182 10*3/uL (ref 150–400)
RBC: 3.59 MIL/uL — ABNORMAL LOW (ref 3.87–5.11)
RDW: 14.4 % (ref 11.5–15.5)
WBC: 10.7 10*3/uL — ABNORMAL HIGH (ref 4.0–10.5)

## 2013-05-21 MED ORDER — FAMOTIDINE 20 MG PO TABS
20.0000 mg | ORAL_TABLET | Freq: Two times a day (BID) | ORAL | Status: DC
  Administered 2013-05-21 – 2013-05-22 (×4): 20 mg via ORAL
  Filled 2013-05-21 (×4): qty 1

## 2013-05-21 NOTE — Progress Notes (Signed)
POSTOPERATIVE DAY # 1 S/P CS  S:         Reports feeling ok             Tolerating po intake / no nausea / no vomiting / no flatus / no BM             Bleeding is light             Pain controlled with motrin and percocet             Up ad lib to bathroom / not ambulating yet / voiding QS  Newborn breast feeding    O:  VS: BP 127/85  Pulse 99  Temp(Src) 98.5 F (36.9 C) (Oral)  Resp 18  Ht 5\' 3"  (1.6 m)  Wt 95.709 kg (211 lb)  BMI 37.39 kg/m2  SpO2 98%   LABS:               Recent Labs  05/20/13 1420 05/21/13 0609  WBC 12.1* 10.7*  HGB 10.6* 9.5*  PLT 197 182               Bloodtype: --/--/O POS, O POS (08/01 0250)  Rubella: Immune (01/22 0000)                                             I&O: Intake/Output     08/01 0701 - 08/02 0700 08/02 0701 - 08/03 0700   P.O. 960    I.V. (mL/kg) 2700 (28.2)    Other 720    Total Intake(mL/kg) 4380 (45.8)    Urine (mL/kg/hr) 5825 (2.5) 450 (1)   Blood 1200 (0.5)    Total Output 7025 450   Net -2645 -450                    Physical Exam:             Alert and Oriented X3  Lungs: Clear and unlabored  Heart: regular rate and rhythm / no mumurs  Abdomen: soft, non-tender, non-distended active BS             Fundus: firm, non-tender, Ueven             Dressing intact pressure dressing          Lochia: small  Extremities: no edema, no calf pain or tenderness, neg Homans  A:        POD # 1 S/P CS            IDA of pregnancy with additional ABL  P:        Routine postoperative care              Advance postop care   Marlinda Mike CNM, MSN, [facility] 05/21/2013, 11:38 AM

## 2013-05-22 MED ORDER — DSS 100 MG PO CAPS: 100.0000 mg | ORAL_CAPSULE | Freq: Every day | ORAL | Status: DC

## 2013-05-22 MED ORDER — MAGNESIUM OXIDE 400 (241.3 MG) MG PO TABS
400.0000 mg | ORAL_TABLET | Freq: Every day | ORAL | Status: DC
  Administered 2013-05-22: 400 mg via ORAL
  Filled 2013-05-22 (×2): qty 1

## 2013-05-22 MED ORDER — MAGNESIUM OXIDE 400 (241.3 MG) MG PO TABS: 400.0000 mg | ORAL_TABLET | Freq: Every day | ORAL | Status: DC

## 2013-05-22 MED ORDER — IBUPROFEN 800 MG PO TABS: 800.0000 mg | ORAL_TABLET | Freq: Three times a day (TID) | ORAL | Status: DC

## 2013-05-22 MED ORDER — IBUPROFEN 800 MG PO TABS
800.0000 mg | ORAL_TABLET | Freq: Three times a day (TID) | ORAL | Status: DC
  Administered 2013-05-22: 800 mg via ORAL
  Filled 2013-05-22 (×2): qty 1

## 2013-05-22 MED ORDER — DOCUSATE SODIUM 100 MG PO CAPS
100.0000 mg | ORAL_CAPSULE | Freq: Every day | ORAL | Status: DC
  Administered 2013-05-22: 100 mg via ORAL
  Filled 2013-05-22: qty 1

## 2013-05-22 MED ORDER — OXYCODONE-ACETAMINOPHEN 5-325 MG PO TABS: 1.0000 | ORAL_TABLET | ORAL | Status: DC | PRN

## 2013-05-22 NOTE — Discharge Summary (Signed)
POSTOPERATIVE DISCHARGE SUMMARY:  Patient ID: Sheri Malone MRN: [id number] DOB/AGE: 1986-06-17 27 y.o.  Admit date: 05/20/2013 Admission Diagnoses: 39 weeks with onset of labor / gestational hypertension / IDA of pregnancy  Discharge date:  05/22/2013 Discharge Diagnoses: POD 2 s/p cesarean section / IDA compounded with ABL postop  Prenatal history: G2P1010   EDC : 05/25/2013, by Other Basis  Prenatal care at [facility]  Primary provider : Ernestina Penna Prenatal course complicated by IDA / gestational hypertension / GERD / migraines/ constipation  Prenatal Labs: ABO, Rh: --/--/O POS, O POS (08/01 0250)  Antibody: NEG (08/01 0250) Rubella: Immune (01/22 0000)   RPR: NON REACTIVE (08/01 0250)  HBsAg: Negative (01/22 0000)  HIV: Non-reactive (01/22 0000)  GTT : NL GBS: Negative (07/07 0000)   Medical / Surgical History :  Past medical history:  Past Medical History  Diagnosis Date  . Hypertension   . Gestational HTN   . Low hemoglobin     on ironspan  . GERD (gastroesophageal reflux disease)   . URI (upper respiratory infection)   . Rhinitis   . Headache(784.0)   . Abnormal Pap smear   . History of ectopic pregnancy     Past surgical history:  Past Surgical History  Procedure Laterality Date  . Salpingoophorectomy  2009    left  . Wisdom tooth extraction      Family History: History reviewed. No pertinent family history.  Social History:  reports that she has never smoked. She does not have any smokeless tobacco history on file. She reports that she does not drink alcohol or use illicit drugs.  Allergies: Review of patient's allergies indicates no known allergies.   Current Medications at time of admission:  Prior to Admission medications   Medication Sig Start Date End Date Taking? Authorizing Provider  butalbital-acetaminophen-caffeine (FIORICET, ESGIC) 50-325-40 MG per tablet Take 1 tablet by mouth 2 (two) times daily as needed for headache.    Yes Historical Provider, MD  doxylamine, Sleep, (UNISOM) 25 MG tablet Take 25 mg by mouth at bedtime as needed for sleep.   Yes Historical Provider, MD  Fe-Succ Ac-B Cmplx-C-Ca-FA (IROSPAN 24/6 PO) Take by mouth.   Yes Historical Provider, MD  Prenatal Vit-Fe Fumarate-FA (MULTIVITAMIN-PRENATAL) 27-0.8 MG TABS Take 1 tablet by mouth daily at 12 noon.   Yes Historical Provider, MD  ranitidine (ZANTAC) 150 MG capsule Take 150 mg by mouth 2 (two) times daily.   Yes Historical Provider, MD                                 Intrapartum Course:  Admit for onset of labor with labor progression to 3 dilation with arrested labor curve and FHR decelerations   Pain management: epidural Complicated by: arrest of dilation at 3 cm / no fetal descent in labor Interventions required: cesarean section delivery  Procedures: Cesarean section delivery on 05/20/2013 with delivery of female newborn by Dr Juliene Pina   See operative report for further details APGAR (1 MIN): 9   APGAR (5 MINS): 9    Postoperative / postpartum course:  Uncomplicated with discharge on POD 2 BP remained in normal range / no PIH signs  Physical Exam:   VSS: Temp:  [98 F (36.7 C)-98.6 F (37 C)] 98.6 F (37 C) (08/03 0600) Pulse Rate:  [96-100] 96 (08/03 0600) Resp:  [18] 18 (08/03 0600) BP: (129-135)/(83-87) 129/84 mmHg (08/03 0600) SpO2:  [  97 %-100 %] 97 % (08/03 0600)  LABS:  Recent Labs  05/20/13 1420 05/21/13 0609  WBC 12.1* 10.7*  HGB 10.6* 9.5*  PLT 197 182    General: pleasant / NAD / ambulatory Heart: RRR Lungs: clear  Abdomen: soft and non-tender / non-distended / active BS  Extremities: trace edema / negative Homans  Dressing: intact honeycomb dressing Incision:  approximated with subcuticular / no erythema / no ecchymosis / no drainage  Discharge Instructions:  Discharged Condition: stable  Activity: pelvic rest and postoperative restrictions x 2   Diet: routine  Medications:    Medication  List    STOP taking these medications       labetalol 100 MG tablet  Commonly known as:  NORMODYNE     NIFEdipine 10 MG capsule  Commonly known as:  PROCARDIA      TAKE these medications       butalbital-acetaminophen-caffeine 50-325-40 MG per tablet  Commonly known as:  FIORICET, ESGIC  Take 1 tablet by mouth 2 (two) times daily as needed for headache.     doxylamine (Sleep) 25 MG tablet  Commonly known as:  UNISOM  Take 25 mg by mouth at bedtime as needed for sleep.     DSS 100 MG Caps  Take 100 mg by mouth daily.     ibuprofen 800 MG tablet  Commonly known as:  ADVIL,MOTRIN  Take 1 tablet (800 mg total) by mouth 3 (three) times daily.     IROSPAN 24/6 PO  Take by mouth.     magnesium oxide 400 (241.3 MG) MG tablet  Commonly known as:  MAG-OX  Take 1 tablet (400 mg total) by mouth daily.     multivitamin-prenatal 27-0.8 MG Tabs  Take 1 tablet by mouth daily at 12 noon.     oxyCODONE-acetaminophen 5-325 MG per tablet  Commonly known as:  PERCOCET/ROXICET  Take 1-2 tablets by mouth every 4 (four) hours as needed.     ranitidine 150 MG capsule  Commonly known as:  ZANTAC  Take 150 mg by mouth 2 (two) times daily.        Wound Care: keep clean and dry / remove honeycomb POD 4-5 Postpartum Instructions: [facility] discharge booklet - instructions reviewed  Discharge to: Home  Follow up :   [facility] in 6 weeks for routine postpartum visit with Dr Ernestina Penna                Signed: Marlinda Mike CNM, MSN, [facility] 05/22/2013, 11:04 AM

## 2013-05-22 NOTE — Progress Notes (Signed)
POSTOPERATIVE DAY # 2 S/P cesarean section   S:         Reports feeling well - mostly just sore - interested in early DC today - has Peds visit tomorrow scheduled             Tolerating po intake / no nausea / no vomiting / + flatus / no BM             Bleeding is light             Pain controlled with motrin and percocet - would like stronger motrin to 800mg  if possible             Up ad lib / ambulatory/ voiding QS  Newborn breast feeding  / female   O:  VS: BP 129/84  Pulse 96  Temp(Src) 98.6 F (37 C) (Oral)  Resp 18  Ht 5\' 3"  (1.6 m)  Wt 95.709 kg (211 lb)  BMI 37.39 kg/m2  SpO2 97%   LABS:               Recent Labs  05/20/13 1420 05/21/13 0609  WBC 12.1* 10.7*  HGB 10.6* 9.5*  PLT 197 182               Bloodtype: --/--/O POS, O POS (08/01 0250)  Rubella: Immune (01/22 0000)                                             I&O: -1610             Physical Exam:             Alert and Oriented X3  Lungs: Clear and unlabored  Heart: regular rate and rhythm / no mumurs  Abdomen: soft, non-tender, non-distended active BS             Fundus: firm, non-tender, U-1             Dressing intact honeycomb              Incision:  approximated with subcuticular with steri-strips / no erythema / no ecchymosis / no drainage  Perineum: no edema  Lochia: light spotting  Extremities: trace edema, no calf pain or tenderness, negative Homans  A:        POD # 2 S/P CS            Mild ABL anemia            Stable for early DC  P:        Routine postoperative care              D/C home - [facility] booklet instructions / reviewed     Marlinda Mike CNM, MSN, [facility] 05/22/2013, 10:56 AM

## 2013-05-23 ENCOUNTER — Inpatient Hospital Stay (HOSPITAL_COMMUNITY): Admission: RE | Admit: 2013-05-23 | Payer: 59 | Source: Ambulatory Visit

## 2013-05-23 ENCOUNTER — Encounter (HOSPITAL_COMMUNITY): Payer: Self-pay | Admitting: Obstetrics & Gynecology

## 2013-05-27 ENCOUNTER — Encounter (HOSPITAL_COMMUNITY): Payer: Self-pay | Admitting: *Deleted

## 2013-07-20 LAB — HM PAP SMEAR: HM Pap smear: NORMAL

## 2013-12-13 LAB — HEMOGLOBIN A1C: Hgb A1c MFr Bld: 5.1 % (ref 4.0–6.0)

## 2014-01-27 LAB — LIPID PANEL
Cholesterol: 138 mg/dL (ref 0–200)
HDL: 42 mg/dL (ref 35–70)
LDL Cholesterol: 58 mg/dL
Triglycerides: 188 mg/dL — AB (ref 40–160)

## 2014-08-21 ENCOUNTER — Encounter (HOSPITAL_COMMUNITY): Payer: Self-pay | Admitting: *Deleted

## 2015-06-21 ENCOUNTER — Encounter: Payer: Self-pay | Admitting: Family Medicine

## 2015-06-21 ENCOUNTER — Ambulatory Visit (INDEPENDENT_AMBULATORY_CARE_PROVIDER_SITE_OTHER): Payer: 59 | Admitting: Family Medicine

## 2015-06-21 VITALS — BP 118/84 | HR 97 | Temp 98.2°F | Resp 16 | Ht 63.0 in | Wt 191.2 lb

## 2015-06-21 DIAGNOSIS — T783XXD Angioneurotic edema, subsequent encounter: Secondary | ICD-10-CM | POA: Diagnosis not present

## 2015-06-21 DIAGNOSIS — L309 Dermatitis, unspecified: Secondary | ICD-10-CM | POA: Insufficient documentation

## 2015-06-21 DIAGNOSIS — Z8632 Personal history of gestational diabetes: Secondary | ICD-10-CM | POA: Insufficient documentation

## 2015-06-21 DIAGNOSIS — L508 Other urticaria: Secondary | ICD-10-CM

## 2015-06-21 DIAGNOSIS — G43909 Migraine, unspecified, not intractable, without status migrainosus: Secondary | ICD-10-CM | POA: Insufficient documentation

## 2015-06-21 DIAGNOSIS — Z98891 History of uterine scar from previous surgery: Secondary | ICD-10-CM | POA: Insufficient documentation

## 2015-06-21 MED ORDER — LORATADINE 10 MG PO TABS: 10.0000 mg | ORAL_TABLET | Freq: Two times a day (BID) | ORAL | Status: DC

## 2015-06-21 MED ORDER — EPINEPHRINE 0.3 MG/0.3ML IJ SOAJ
0.3000 mg | Freq: Once | INTRAMUSCULAR | Status: DC
Start: 2015-06-21 — End: 2020-04-13

## 2015-06-21 MED ORDER — HYDROXYZINE HCL 25 MG PO TABS: 25.0000 mg | ORAL_TABLET | Freq: Three times a day (TID) | ORAL | Status: DC | PRN

## 2015-06-21 NOTE — Progress Notes (Signed)
Name: Sheri Malone   MRN: [id number]    DOB: 1986/08/18   Date:06/21/2015       Progress Note  Subjective  Chief Complaint  Chief Complaint  Patient presents with  . 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 Patient states it started on left hand and has spreaded to bilateral arms, stomach and crest of legs. Patient went to Urgent Care on Tuesday and was given Prednisone and a shot but symptoms are unchanged.   Marland Kitchen Urticaria    Patient is having red, raised, bumpy, itchy spots all over, bilateral arms, legs, neck and stomach. Worsen. Patient has been taking Prednisone, Bendaryl and Hydrocortisone cream on spots with no relief.     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 She cannot recall any triggers for the urticaria. No new soaps, lotions or perfumes, no change in any hygiene products. Travelled to [place] a few weeks ago. Daughter is 2 yo and has a mild cold, but patient has not been sick. Exposure to insect bite was one week prior to angioedema or left eye and urticaria. She states she had some wheezing yesterday, no SOB , nausea or vomiting. She still eats tomatoes in her food. Had pizza the day that urticaria started and had salsa yesterday.   Patient Active Problem List   Diagnosis Date Noted  . Dermatitis, eczematoid 06/21/2015  . H/O cesarean section 06/21/2015  . History of diabetes mellitus arising in pregnancy 06/21/2015  . Headache, migraine 06/21/2015  . Urticaria, acute 06/21/2015    Past Surgical History  Procedure Laterality Date  . Salpingoophorectomy  2009    left  . Wisdom tooth extraction    . Cesarean section N/A 05/20/2013    Procedure: CESAREAN SECTION;  Surgeon: Genia Del, MD;  Location: [facility];  Service: Obstetrics;  Laterality: N/A;  primary  . Dilation and curettage of uterus  2009    Family History  Problem Relation  Age of Onset  . Diabetes Father   . Hypertension Father   . Cancer Maternal Grandfather     prostate  . Heart disease Paternal Grandfather   . Multiple sclerosis Sister     Social History   Social History  . Marital Status: Married    Spouse Name: N/A  . Number of Children: N/A  . Years of Education: N/A   Occupational History  . Not on file.   Social History Main Topics  . Smoking status: Never Smoker   . Smokeless tobacco: Never Used  . Alcohol Use: 0.0 oz/week    0 Standard drinks or equivalent per week     Comment: occasionally  . Drug Use: No  . Sexual Activity:    Partners: Male    Birth Control/ Protection: Pill   Other Topics Concern  . Not on file   Social History Narrative     Current outpatient prescriptions:  .  butalbital-acetaminophen-caffeine (FIORICET, ESGIC) 50-325-40 MG per tablet, Take 1 tablet by mouth 2 (two) times daily as needed for headache., Disp: , Rfl:  .  cyclobenzaprine (FLEXERIL) 5 MG tablet, Take by mouth., Disp: , Rfl:  .  doxylamine, Sleep, (UNISOM) 25 MG tablet, Take 25 mg by mouth at bedtime as needed for sleep., Disp: , Rfl:  .  MICROGESTIN 1-20 MG-MCG tablet, Take 1 tablet by mouth daily., Disp: , Rfl:  .  predniSONE (STERAPRED UNI-PAK 48 TAB) 10 MG (48) TBPK tablet, , Disp: , Rfl:  .  ranitidine (ZANTAC) 150 MG capsule, Take 150 mg by mouth 2 (two) times daily., Disp: , Rfl:  .  SUMAtriptan (IMITREX) 100 MG tablet, Take by mouth., Disp: , Rfl:  .  EPINEPHrine (EPIPEN 2-PAK) 0.3 mg/0.3 mL IJ SOAJ injection, Inject 0.3 mLs (0.3 mg total) into the muscle once., Disp: 1 Device, Rfl: 0 .  hydrOXYzine (ATARAX/VISTARIL) 25 MG tablet, Take 1 tablet (25 mg total) by mouth 3 (three) times daily as needed., Disp: 30 tablet, Rfl: 0 .  loratadine (CLARITIN) 10 MG tablet, Take 1 tablet (10 mg total) by mouth 2 (two) times daily., Disp: 60 tablet, Rfl: 0  No Known Allergies   ROS  Constitutional: Negative for fever or weight change.   Respiratory: Negative for cough and shortness of breath.   Cardiovascular: Negative for chest pain or palpitations.  Gastrointestinal: Negative for abdominal pain, no bowel changes.  Musculoskeletal: Negative for gait problem or joint swelling.  Skin:Positive for  rash.  Neurological: Negative for dizziness or headache.  No other specific complaints in a complete review of systems (except as listed in HPI above).  Objective  Filed Vitals:   06/21/15 0941  BP: 118/84  Pulse: 97  Temp: 98.2 F (36.8 C)  TempSrc: Oral  Resp: 16  Height:  (1.6 m)  Weight: 191 lb 3.2 oz (86.728 kg)  SpO2: 98%    Body mass index is 33.88 kg/(m^2).  Physical Exam  Constitutional: Patient appears well-developed and well-nourished. Overweight No distress.  HEENT: head atraumatic, normocephalic, pupils equal and reactive to light,neck supple, throat within normal limits Cardiovascular: Normal rate, regular rhythm and normal heart sounds.  No murmur heard. No BLE edema. Pulmonary/Chest: Effort normal and breath sounds normal. No respiratory distress. Abdominal: Soft.  There is no tenderness. Psychiatric: Patient has a normal mood and affect. behavior is normal. Judgment and thought content normal. Skin: shows confluent papules on her arms - ventral aspect and legs ( popliteal fossa) and inner thigh, also on her trunk  PHQ2/9: Depression screen PHQ 2/9 06/21/2015  Decreased Interest 0  Down, Depressed, Hopeless 0  PHQ - 2 Score 0     Fall Risk: Fall Risk  06/21/2015  Falls in the past year? No      Assessment & Plan  1. Urticaria, acute Continue prednisone, add loratadine, add hydroxyzine, stop benadryl and add epipen, refer to Allergist, avoid tomatoes in any form until seen by allergist.  - loratadine (CLARITIN) 10 MG tablet; Take 1 tablet (10 mg total) by mouth 2 (two) times daily.  Dispense: 60 tablet; Refill: 0 - hydrOXYzine (ATARAX/VISTARIL) 25 MG tablet; Take 1 tablet (25 mg  total) by mouth 3 (three) times daily as needed.  Dispense: 30 tablet; Refill: 0 - EPINEPHrine (EPIPEN 2-PAK) 0.3 mg/0.3 mL IJ SOAJ injection; Inject 0.3 mLs (0.3 mg total) into the muscle once.  Dispense: 1 Device; Refill: 0 - Ambulatory referral to Allergy  2. Angioedema, subsequent encounter  - loratadine (CLARITIN) 10 MG tablet; Take 1 tablet (10 mg total) by mouth 2 (two) times daily.  Dispense: 60 tablet; Refill: 0 - hydrOXYzine (ATARAX/VISTARIL) 25 MG tablet; Take 1 tablet (25 mg total) by mouth 3 (three) times daily as needed.  Dispense: 30 tablet; Refill: 0 - EPINEPHrine (EPIPEN 2-PAK) 0.3 mg/0.3 mL IJ SOAJ injection; Inject 0.3 mLs (0.3 mg total) into the muscle once.  Dispense: 1 Device; Refill: 0 - Ambulatory  referral to Allergy

## 2015-06-21 NOTE — Patient Instructions (Signed)
Anaphylactic Reaction An anaphylactic reaction is a sudden, severe allergic reaction that involves the whole body. It can be life threatening. A hospital stay is often required. People with asthma, eczema, or hay fever are slightly more likely to have an anaphylactic reaction. CAUSES  An anaphylactic reaction may be caused by anything to which you are allergic. After being exposed to the allergic substance, your immune system becomes sensitized to it. When you are exposed to that allergic substance again, an allergic reaction can occur. Common causes of an anaphylactic reaction include:  Medicines.  Foods, especially peanuts, wheat, shellfish, milk, and eggs.  Insect bites or stings.  Blood products.  Chemicals, such as dyes, latex, and contrast material used for imaging tests. SYMPTOMS  When an allergic reaction occurs, the body releases histamine and other substances. These substances cause symptoms such as tightening of the airway. Symptoms often develop within seconds or minutes of exposure. Symptoms may include:  Skin rash or hives.  Itching.  Chest tightness.  Swelling of the eyes, tongue, or lips.  Trouble breathing or swallowing.  Lightheadedness or fainting.  Anxiety or confusion.  Stomach pains, vomiting, or diarrhea.  Nasal congestion.  A fast or irregular heartbeat (palpitations). DIAGNOSIS  Diagnosis is based on your history of recent exposure to allergic substances, your symptoms, and a physical exam. Your caregiver may also perform blood or urine tests to confirm the diagnosis. TREATMENT  Epinephrine medicine is the main treatment for an anaphylactic reaction. Other medicines that may be used for treatment include antihistamines, steroids, and albuterol. In severe cases, fluids and medicine to support blood pressure may be given through an intravenous line (IV). Even if you improve after treatment, you need to be observed to make sure your condition does not get  worse. This may require a stay in the hospital. HOME CARE INSTRUCTIONS   Wear a medical alert bracelet or necklace stating your allergy.  You and your family must learn how to use an anaphylaxis kit or give an epinephrine injection to temporarily treat an emergency allergic reaction. Always carry your epinephrine injection or anaphylaxis kit with you. This can be lifesaving if you have a severe reaction.  Do not drive or perform tasks after treatment until the medicines used to treat your reaction have worn off, or until your caregiver says it is okay.  If you have hives or a rash:  Take medicines as directed by your caregiver.  You may use an over-the-counter antihistamine (diphenhydramine) as needed.  Apply cold compresses to the skin or take baths in cool water. Avoid hot baths or showers. SEEK MEDICAL CARE IF:   You develop symptoms of an allergic reaction to a new substance. Symptoms may start right away or minutes later.  You develop a rash, hives, or itching.  You develop new symptoms. SEEK IMMEDIATE MEDICAL CARE IF:   You have swelling of the mouth, difficulty breathing, or wheezing.  You have a tight feeling in your chest or throat.  You develop hives, swelling, or itching all over your body.  You develop severe vomiting or diarrhea.  You feel faint or pass out. This is an emergency. Use your epinephrine injection or anaphylaxis kit as you have been instructed. Call your local emergency services (911 in [place] Even if you improve after the injection, you need to be examined at a hospital emergency department. MAKE SURE YOU:   Understand these instructions.  Will watch your condition.  Will get help right away if you are not   doing well or get worse. Document Released: 10/06/2005 Document Revised: 10/11/2013 Document Reviewed: 01/07/2012 ExitCare Patient Information 2015 [employer]. This information is not intended to replace advice given to you by your health  care provider. Make sure you discuss any questions you have with your health care provider.  

## 2015-06-26 ENCOUNTER — Telehealth: Payer: Self-pay | Admitting: Family Medicine

## 2015-06-26 NOTE — Telephone Encounter (Signed)
PT SAID THAT [facility] FAX HAS BEEN DONE LAST WEEK AND ASKED IF YOU COULD REFAX THE REFERAL BACK TO THEM.

## 2015-06-27 NOTE — Telephone Encounter (Signed)
Referral has been done and pt is aware of appt

## 2015-09-20 ENCOUNTER — Telehealth: Payer: Self-pay | Admitting: Family Medicine

## 2015-09-20 NOTE — Telephone Encounter (Signed)
Patient was informed that there was no documentation of her receiving a flu shot in Epic or [employer]. Patient was informed that she could come by at anytime to get her flu shot and she said ok.

## 2015-09-20 NOTE — Telephone Encounter (Signed)
Pt would like to know when her last flu shot is. Please call pt.

## 2015-09-27 ENCOUNTER — Ambulatory Visit (INDEPENDENT_AMBULATORY_CARE_PROVIDER_SITE_OTHER): Payer: 59

## 2015-09-27 DIAGNOSIS — Z23 Encounter for immunization: Secondary | ICD-10-CM | POA: Diagnosis not present

## 2016-01-17 ENCOUNTER — Ambulatory Visit (INDEPENDENT_AMBULATORY_CARE_PROVIDER_SITE_OTHER): Payer: 59 | Admitting: Family Medicine

## 2016-01-17 ENCOUNTER — Encounter: Payer: Self-pay | Admitting: Family Medicine

## 2016-01-17 VITALS — BP 112/74 | HR 102 | Temp 98.4°F | Resp 16 | Wt 194.2 lb

## 2016-01-17 DIAGNOSIS — J0100 Acute maxillary sinusitis, unspecified: Secondary | ICD-10-CM | POA: Diagnosis not present

## 2016-01-17 DIAGNOSIS — J302 Other seasonal allergic rhinitis: Secondary | ICD-10-CM

## 2016-01-17 DIAGNOSIS — Z23 Encounter for immunization: Secondary | ICD-10-CM | POA: Diagnosis not present

## 2016-01-17 DIAGNOSIS — H1010 Acute atopic conjunctivitis, unspecified eye: Secondary | ICD-10-CM | POA: Insufficient documentation

## 2016-01-17 DIAGNOSIS — J4520 Mild intermittent asthma, uncomplicated: Secondary | ICD-10-CM | POA: Insufficient documentation

## 2016-01-17 MED ORDER — AZELASTINE HCL 0.1 % NA SOLN: 2.0000 | Freq: Two times a day (BID) | NASAL | Status: DC

## 2016-01-17 MED ORDER — FLUTICASONE PROPIONATE 50 MCG/ACT NA SUSP: 2.0000 | Freq: Every day | NASAL | Status: DC

## 2016-01-17 MED ORDER — MOXIFLOXACIN HCL 400 MG PO TABS: 400.0000 mg | ORAL_TABLET | Freq: Every day | ORAL | Status: DC

## 2016-01-17 NOTE — Addendum Note (Signed)
Addended by: Alba CorySOWLES, Alexande Sheerin F on: 01/17/2016 02:24 PM   Modules accepted: Orders

## 2016-01-17 NOTE — Progress Notes (Addendum)
Name: Sheri Malone   MRN: [id number]    DOB: 11-12-1985   Date:01/17/2016       Progress Note  Subjective  Chief Complaint  Chief Complaint  Patient presents with  . Sinusitis    was seen at the walk-in clinic and was given Amoxicillin 875mg  x 10 days, singular & tesslon perls  . Nasal Congestion    patient stated that she is still congested. patient stated that he sense of smell is limited. some nose bleeds. used flucatisone & neti pot.  . Ear Fullness    patient stated that she has had some popping and pressure.  . Cough    some post nasal drainage.    HPI  Maxillary sinusitis: she was seen at local [facility] on March 7th because of facial pressure, nasal congestion, post-nasal drainage and cough. She was diagnosed with sinusitis, given Augmentin for 10 days, tessalon perles. She has also been using Flonase given by allergies. She states some of symptoms have improved, but continues to have a cough, that is worse at night, ear fullness, post-nasal drainage and right facial pressure. No fever, no chills. She states fatigue has improved.    Patient Active Problem List   Diagnosis Date Noted  . Allergic conjunctivitis 01/17/2016  . Dermatitis, eczematoid 06/21/2015  . H/O cesarean section 06/21/2015  . History of diabetes mellitus arising in pregnancy 06/21/2015  . Headache, migraine 06/21/2015  . Urticaria, acute 06/21/2015    Past Surgical History  Procedure Laterality Date  . Salpingoophorectomy  2009    left  . Wisdom tooth extraction    . Cesarean section N/A 05/20/2013    Procedure: CESAREAN SECTION;  Surgeon: Genia DelMarie-Lyne Lavoie, MD;  Location: [facility];  Service: Obstetrics;  Laterality: N/A;  primary  . Dilation and curettage of uterus  2009    Family History  Problem Relation Age of Onset  . Diabetes Father   . Hypertension Father   . Cancer Maternal Grandfather     prostate  . Heart disease Paternal Grandfather   . Multiple sclerosis Sister     Social  History   Social History  . Marital Status: Married    Spouse Name: N/A  . Number of Children: N/A  . Years of Education: N/A   Occupational History  . Not on file.   Social History Main Topics  . Smoking status: Never Smoker   . Smokeless tobacco: Never Used  . Alcohol Use: 0.0 oz/week    0 Standard drinks or equivalent per week     Comment: occasionally  . Drug Use: No  . Sexual Activity:    Partners: Male    Birth Control/ Protection: Pill   Other Topics Concern  . Not on file   Social History Narrative     Current outpatient prescriptions:  .  butalbital-acetaminophen-caffeine (FIORICET, ESGIC) 50-325-40 MG per tablet, Take 1 tablet by mouth 2 (two) times daily as needed for headache., Disp: , Rfl:  .  CLARAVIS 40 MG capsule, , Disp: , Rfl:  .  cyclobenzaprine (FLEXERIL) 5 MG tablet, Take by mouth., Disp: , Rfl:  .  doxylamine, Sleep, (UNISOM) 25 MG tablet, Take 25 mg by mouth at bedtime as needed for sleep., Disp: , Rfl:  .  EPINEPHrine (EPIPEN 2-PAK) 0.3 mg/0.3 mL IJ SOAJ injection, Inject 0.3 mLs (0.3 mg total) into the muscle once., Disp: 1 Device, Rfl: 0 .  hydrOXYzine (ATARAX/VISTARIL) 25 MG tablet, Take 1 tablet (25 mg total) by mouth 3 (three)  times daily as needed., Disp: 30 tablet, Rfl: 0 .  loratadine (CLARITIN) 10 MG tablet, Take 1 tablet (10 mg total) by mouth 2 (two) times daily., Disp: 60 tablet, Rfl: 0 .  MICROGESTIN 1-20 MG-MCG tablet, Take 1 tablet by mouth daily., Disp: , Rfl:  .  montelukast (SINGULAIR) 10 MG tablet, , Disp: , Rfl:  .  ranitidine (ZANTAC) 150 MG capsule, Take 150 mg by mouth 2 (two) times daily., Disp: , Rfl:  .  SUMAtriptan (IMITREX) 100 MG tablet, Take by mouth., Disp: , Rfl:   No Known Allergies   ROS  Ten systems reviewed and is negative except as mentioned in HPI   Objective  Filed Vitals:   01/17/16 1353  BP: 112/74  Pulse: 102  Temp: 98.4 F (36.9 C)  TempSrc: Oral  Resp: 16  Weight: 194 lb 3.2 oz (88.089 kg)   SpO2: 98%    Body mass index is 34.41 kg/(m^2).  Physical Exam  Constitutional: Patient appears well-developed and well-nourished. Obese No distress.  HEENT: head atraumatic, normocephalic, pupils equal and reactive to light, ears normal TM,  neck supple, throat within normal limits, boggy turbinates, tender right maxillary sinus  Cardiovascular: Normal rate, regular rhythm and normal heart sounds.  No murmur heard. No BLE edema. Pulmonary/Chest: Effort normal and breath sounds normal. No respiratory distress. Abdominal: Soft.  There is no tenderness. Psychiatric: Patient has a normal mood and affect. behavior is normal. Judgment and thought content normal.  PHQ2/9: Depression screen [facility] 2/9 01/17/2016 06/21/2015  Decreased Interest 0 0  Down, Depressed, Hopeless 0 0  PHQ - 2 Score 0 0     Fall Risk: Fall Risk  01/17/2016 06/21/2015  Falls in the past year? No No     Functional Status Survey: Is the patient deaf or have difficulty hearing?: No Does the patient have difficulty seeing, even when wearing glasses/contacts?: No Does the patient have difficulty concentrating, remembering, or making decisions?: No Does the patient have difficulty walking or climbing stairs?: No Does the patient have difficulty dressing or bathing?: No Does the patient have difficulty doing errands alone such as visiting a doctor's office or shopping?: No    Assessment & Plan  1. Subacute maxillary sinusitis  Advised to hold off on filling antibiotics, discussed risk of c.difi colitis , try adding Astelin and saline spray, continue follow up with allergies, call back for referral to ENT if no improvement - fluticasone (FLONASE) 50 MCG/ACT nasal spray; Place 2 sprays into both nostrils daily.  Dispense: 16 g; Refill: 0 - moxifloxacin (AVELOX) 400 MG tablet; Take 1 tablet (400 mg total) by mouth daily.  Dispense: 7 tablet; Refill: 0  2. Seasonal allergic rhinitis  - azelastine (ASTELIN) 0.1 % nasal  spray; Place 2 sprays into both nostrils 2 (two) times daily. Use in each nostril as directed  Dispense: 30 mL; Refill: 2  3. Need for Tdap vaccination  - Tdap vaccine greater than or equal to 7yo IM

## 2016-06-04 ENCOUNTER — Telehealth: Payer: Self-pay | Admitting: Family Medicine

## 2016-06-05 ENCOUNTER — Encounter: Payer: Self-pay | Admitting: Family Medicine

## 2016-06-05 ENCOUNTER — Ambulatory Visit (INDEPENDENT_AMBULATORY_CARE_PROVIDER_SITE_OTHER): Payer: 59 | Admitting: Family Medicine

## 2016-06-05 DIAGNOSIS — J0100 Acute maxillary sinusitis, unspecified: Secondary | ICD-10-CM

## 2016-06-05 MED ORDER — AMOXICILLIN-POT CLAVULANATE 875-125 MG PO TABS
1.0000 | ORAL_TABLET | Freq: Two times a day (BID) | ORAL | 0 refills | Status: DC
Start: 2016-06-05 — End: 2016-11-14

## 2016-06-05 NOTE — Progress Notes (Signed)
Name: Sheri Malone   MRN: [id number]    DOB: 10/20/1986   Date:06/05/2016       Progress Note  Subjective  Chief Complaint  Chief Complaint  Patient presents with  . Ear Fullness    Ear Fullness   There is pain in the left ear. This is a new problem. Episode onset: 2 weeks  The problem has been unchanged. There has been no fever. Associated symptoms include coughing and ear discharge. Pertinent negatives include no headaches or sore throat. She has tried ear drops for the symptoms. The treatment provided no relief. There is no history of a chronic ear infection.  Started after she returned from a trip to [place]  Past Medical History:  Diagnosis Date  . Abnormal Pap smear   . GERD (gastroesophageal reflux disease)   . Gestational HTN   . Headache(784.0)   . History of ectopic pregnancy   . History of gestational diabetes   . Hypertension   . Low hemoglobin    on ironspan  . Rhinitis     Past Surgical History:  Procedure Laterality Date  . CESAREAN SECTION N/A 05/20/2013   Procedure: CESAREAN SECTION;  Surgeon: Genia DelMarie-Lyne Lavoie, MD;  Location: [facility];  Service: Obstetrics;  Laterality: N/A;  primary  . DILATION AND CURETTAGE OF UTERUS  2009  . SALPINGOOPHORECTOMY  2009   left  . WISDOM TOOTH EXTRACTION      Family History  Problem Relation Age of Onset  . Diabetes Father   . Hypertension Father   . Cancer Maternal Grandfather     prostate  . Heart disease Paternal Grandfather   . Multiple sclerosis Sister     Social History   Social History  . Marital status: Married    Spouse name: N/A  . Number of children: N/A  . Years of education: N/A   Occupational History  . Not on file.   Social History Main Topics  . Smoking status: Never Smoker  . Smokeless tobacco: Never Used  . Alcohol use 0.0 oz/week     Comment: occasionally  . Drug use: No  . Sexual activity: Yes    Partners: Male    Birth control/ protection: Pill   Other Topics Concern  . Not on  file   Social History Narrative  . No narrative on file     Current Outpatient Prescriptions:  .  azelastine (ASTELIN) 0.1 % nasal spray, Place 2 sprays into both nostrils 2 (two) times daily. Use in each nostril as directed, Disp: 30 mL, Rfl: 2 .  EPINEPHrine (EPIPEN 2-PAK) 0.3 mg/0.3 mL IJ SOAJ injection, Inject 0.3 mLs (0.3 mg total) into the muscle once., Disp: 1 Device, Rfl: 0 .  fluticasone (FLONASE) 50 MCG/ACT nasal spray, Place 2 sprays into both nostrils daily., Disp: 16 g, Rfl: 0 .  hydrOXYzine (ATARAX/VISTARIL) 25 MG tablet, Take 1 tablet (25 mg total) by mouth 3 (three) times daily as needed., Disp: 30 tablet, Rfl: 0 .  MICROGESTIN 1-20 MG-MCG tablet, Take 1 tablet by mouth daily., Disp: , Rfl:  .  montelukast (SINGULAIR) 10 MG tablet, , Disp: , Rfl:  .  ranitidine (ZANTAC) 150 MG capsule, Take 150 mg by mouth 2 (two) times daily., Disp: , Rfl:   No Known Allergies   Review of Systems  HENT: Positive for ear discharge. Negative for sore throat.   Respiratory: Positive for cough.   Neurological: Negative for headaches.    Objective  Vitals:   06/05/16 1346  BP: 120/76  Pulse: (!) 117  Resp: 14  Temp: 98.8 F (37.1 C)  TempSrc: Oral  SpO2: 98%  Weight: 198 lb 14.4 oz (90.2 kg)    Physical Exam  Constitutional: She is well-developed, well-nourished, and in no distress.  HENT:  Head: Normocephalic.  Right Ear: Tympanic membrane and ear canal normal. No swelling or tenderness. No decreased hearing is noted.  Left Ear: Tympanic membrane and ear canal normal. No swelling or tenderness. No decreased hearing is noted.  Nose: Right sinus exhibits maxillary sinus tenderness. Left sinus exhibits maxillary sinus tenderness.  Mouth/Throat: Posterior oropharyngeal erythema present.  Nasal mucosal inflammation, turbinates hypertrophied.  Cardiovascular: Normal rate, regular rhythm, S1 normal and S2 normal.   No murmur heard. Pulmonary/Chest: Breath sounds normal. She has  no wheezes. She has no rhonchi.  Nursing note and vitals reviewed.    Assessment & Plan  1. Acute non-recurrent maxillary sinusitis Ear pain is likely from sinus pressure buildup due to acute sinusitis. Will start on antibiotic therapy. - amoxicillin-clavulanate (AUGMENTIN) 875-125 MG tablet; Take 1 tablet by mouth 2 (two) times daily.  Dispense: 20 tablet; Refill: 0   Reeve Mallo Asad A. Faylene KurtzShah [facility] 06/05/2016 1:54 PM

## 2016-06-12 NOTE — Telephone Encounter (Signed)
I've never seen this patient and there is no other information in the encounter; did you mean to route to Dr. Sherryll BurgerShah? Thanks

## 2016-10-22 DIAGNOSIS — J3089 Other allergic rhinitis: Secondary | ICD-10-CM | POA: Diagnosis not present

## 2016-11-14 ENCOUNTER — Encounter: Payer: Self-pay | Admitting: Family Medicine

## 2016-11-14 ENCOUNTER — Ambulatory Visit (INDEPENDENT_AMBULATORY_CARE_PROVIDER_SITE_OTHER): Payer: 59 | Admitting: Family Medicine

## 2016-11-14 VITALS — BP 108/60 | HR 100 | Temp 98.8°F | Resp 14 | Wt 196.5 lb

## 2016-11-14 DIAGNOSIS — J309 Allergic rhinitis, unspecified: Secondary | ICD-10-CM

## 2016-11-14 DIAGNOSIS — M722 Plantar fascial fibromatosis: Secondary | ICD-10-CM

## 2016-11-14 DIAGNOSIS — J3089 Other allergic rhinitis: Secondary | ICD-10-CM | POA: Diagnosis not present

## 2016-11-14 DIAGNOSIS — G8929 Other chronic pain: Secondary | ICD-10-CM | POA: Diagnosis not present

## 2016-11-14 DIAGNOSIS — M25561 Pain in right knee: Secondary | ICD-10-CM | POA: Diagnosis not present

## 2016-11-14 MED ORDER — MELOXICAM 15 MG PO TABS: 15.0000 mg | ORAL_TABLET | Freq: Every day | ORAL | 0 refills | Status: DC

## 2016-11-14 NOTE — Progress Notes (Signed)
BP 108/60   Pulse 100   Temp 98.8 F (37.1 C) (Oral)   Resp 14   Wt 196 lb 8 oz (89.1 kg)   LMP 11/03/2016   SpO2 98%   BMI 34.81 kg/m    Subjective:    Patient ID: Sheri Malone Rought, female    DOB: 1986-05-22, 31 y.o.   MRN: [id number]  HPI: Sheri Malone Dill is a 31 y.o. female  Chief Complaint  Patient presents with  . Knee Pain    right, related to old injury in high school  . Ankle Pain    right   She is having right knee issues She injured it playing softball; just did xrays and wore a brace on it, like a sprain Just started 1.5 or 2 months ago It's not like unbearable; flares up every once in a while Painful to walk up stairs; standing after sitting; after No swelling in the knee Does not turn red; feels like a burning or tingling, not hot No fevers Has not really tried anything like heat or ice Taking motrin or aleve whichever she had Laying down and propping it up helps some No locking; no crunching or grinding  Right ankle She had a really bad sprain in the 9th grade playing another sport Now the instep; has heard of people having their arches fall;  Pain with first steps of the day Tried different inserts, different shoes, no help Motrin or aleve no help  Left side knee and ankle pretty good, just normal aches and pains  Sees [place]  Depression screen [facility] 2/9 11/14/2016 06/05/2016 01/17/2016 06/21/2015  Decreased Interest 0 0 0 0  Down, Depressed, Hopeless 0 0 0 0  PHQ - 2 Score 0 0 0 0   Relevant past medical, surgical, family and social history reviewed Past Medical History:  Diagnosis Date  . Abnormal Pap smear   . GERD (gastroesophageal reflux disease)   . Gestational HTN   . Headache(784.0)   . History of ectopic pregnancy   . History of gestational diabetes   . Hypertension   . Low hemoglobin    on ironspan  . Rhinitis    Past Surgical History:  Procedure Laterality Date  . CESAREAN SECTION N/A 05/20/2013   Procedure: CESAREAN SECTION;   Surgeon: Genia DelMarie-Lyne Lavoie, MD;  Location: [facility];  Service: Obstetrics;  Laterality: N/A;  primary  . DILATION AND CURETTAGE OF UTERUS  2009  . SALPINGOOPHORECTOMY  2009   left  . WISDOM TOOTH EXTRACTION     Social History  Substance Use Topics  . Smoking status: Never Smoker  . Smokeless tobacco: Never Used  . Alcohol use 0.0 oz/week     Comment: occasionally    Interim medical history since last visit reviewed. Allergies and medications reviewed  Review of Systems Per HPI unless specifically indicated above     Objective:    BP 108/60   Pulse 100   Temp 98.8 F (37.1 C) (Oral)   Resp 14   Wt 196 lb 8 oz (89.1 kg)   LMP 11/03/2016   SpO2 98%   BMI 34.81 kg/m   Wt Readings from Last 3 Encounters:  11/14/16 196 lb 8 oz (89.1 kg)  06/05/16 198 lb 14.4 oz (90.2 kg)  01/17/16 194 lb 3.2 oz (88.1 kg)    Physical Exam  Constitutional: She appears well-developed and well-nourished.  HENT:  Mouth/Throat: Mucous membranes are normal.  Eyes: EOM are normal. No scleral icterus.  Cardiovascular: Normal rate  and regular rhythm.   Pulmonary/Chest: Effort normal and breath sounds normal.  Psychiatric: She has a normal mood and affect. Her behavior is normal.      Assessment & Plan:   Problem List Items Addressed This Visit      Respiratory   Allergic rhinitis    Using anti-histamine       Other Visit Diagnoses    Plantar fasciitis of right foot    -  Primary   discussed dx, treatment recommendations; see AVS; if not improving, let me know for referral to podiatrist   Chronic pain of right knee       going on for 2 months; discussed ddx; will use meloxicam, ice, see AVS      Follow up plan: No Follow-up on file.  An after-visit summary was printed and given to the patient at check-out.  Please see the patient instructions which may contain other information and recommendations beyond what is mentioned above in the assessment and plan.  Meds ordered this  encounter  Medications  . levocetirizine (XYZAL) 5 MG tablet  . meloxicam (MOBIC) 15 MG tablet    Sig: Take 1 tablet (15 mg total) by mouth daily. If needed for pain, inflammation; take with food    Dispense:  30 tablet    Refill:  0    No orders of the defined types were placed in this encounter.

## 2016-11-14 NOTE — Patient Instructions (Addendum)
Start the meloxicam Do not take any other nonsteroidals while on this medicine Okay to take tylenol per package directions Try ice topically, bag or ice pack on the knee and frozen bottle for massage on the foot 15-20 minutes 3 or more times a day Let me know if not better in 3-4 weeks  Plantar Fasciitis Plantar fasciitis is a painful foot condition that affects the heel. It occurs when the band of tissue that connects the toes to the heel bone (plantar fascia) becomes irritated. This can happen after exercising too much or doing other repetitive activities (overuse injury). The pain from plantar fasciitis can range from mild irritation to severe pain that makes it difficult for you to walk or move. The pain is usually worse in the morning or after you have been sitting or lying down for a while. CAUSES This condition may be caused by:  Standing for long periods of time.  Wearing shoes that do not fit.  Doing high-impact activities, including running, aerobics, and ballet.  Being overweight.  Having an abnormal way of walking (gait).  Having tight calf muscles.  Having high arches in your feet.  Starting a new athletic activity. SYMPTOMS The main symptom of this condition is heel pain. Other symptoms include:  Pain that gets worse after activity or exercise.  Pain that is worse in the morning or after resting.  Pain that goes away after you walk for a few minutes. DIAGNOSIS This condition may be diagnosed based on your signs and symptoms. Your health care provider will also do a physical exam to check for:  A tender area on the bottom of your foot.  A high arch in your foot.  Pain when you move your foot.  Difficulty moving your foot. You may also need to have imaging studies to confirm the diagnosis. These can include:  X-rays.  Ultrasound.  MRI. TREATMENT  Treatment for plantar fasciitis depends on the severity of the condition. Your treatment may  include:  Rest, ice, and over-the-counter pain medicines to manage your pain.  Exercises to stretch your calves and your plantar fascia.  A splint that holds your foot in a stretched, upward position while you sleep (night splint).  Physical therapy to relieve symptoms and prevent problems in the future.  Cortisone injections to relieve severe pain.  Extracorporeal shock wave therapy (ESWT) to stimulate damaged plantar fascia with electrical impulses. It is often used as a last resort before surgery.  Surgery, if other treatments have not worked after 12 months. HOME CARE INSTRUCTIONS  Take medicines only as directed by your health care provider.  Avoid activities that cause pain.  Roll the bottom of your foot over a bag of ice or a bottle of cold water. Do this for 20 minutes, 3-4 times a day.  Perform simple stretches as directed by your health care provider.  Try wearing athletic shoes with air-sole or gel-sole cushions or soft shoe inserts.  Wear a night splint while sleeping, if directed by your health care provider.  Keep all follow-up appointments with your health care provider. PREVENTION   Do not perform exercises or activities that cause heel pain.  Consider finding low-impact activities if you continue to have problems.  Lose weight if you need to. The best way to prevent plantar fasciitis is to avoid the activities that aggravate your plantar fascia. SEEK MEDICAL CARE IF:  Your symptoms do not go away after treatment with home care measures.  Your pain gets worse.  Your pain affects your ability to move or do your daily activities. This information is not intended to replace advice given to you by your health care provider. Make sure you discuss any questions you have with your health care provider. Document Released: 07/01/2001 Document Revised: 01/28/2016 Document Reviewed: 08/16/2014 Elsevier Interactive Patient Education  2017 [employer]  Combined  Knee Ligament Sprain Introduction A ligament is a type of fibrous tissue that connects one bone to another bone. There are four ligaments in your knee. Together, they provide stability for your knee joint. A combined knee ligament sprain is an injury in which more than one knee ligament is severely stretched or torn. This kind of injury is also called an injury to multiple structures of the knee. What are the causes? This condition may be caused by:  A direct hit (trauma) to the knee.  Overextending the knee.  Twisting the knee. What increases the risk? This condition is more likely to develop in people who:  Participate in certain sports, including:  Contact sports, such as football, rugby, and lacrosse.  Sports that take place on uneven ground, such as soccer and cross country.  Sports that involve quick changes in position, such as basketball, dancing, gymnastics, and skiing.  Have poor strength or flexibility.  Are overweight.  Have overly flexible joints (joint laxity). What are the signs or symptoms? Symptoms of this condition include:  Swelling.  Pain with movement.  Pain when the injured area is touched.  Instability.  A popping sound that happens at the time of injury.  Inability to stand or use the injured knee to support (bear) one's body weight. How is this diagnosed? This condition is usually diagnosed with a physical exam. You may also have imaging tests, such as:  An X-ray.  MRI. How is this treated? This condition may be treated with:  Ice applied to the affected area.  Medicines for pain.  Placing the knee in a brace or splint to prevent movement.  Physical therapy to help strengthen and stabilize the knee joint.  Surgery to reconstruct a torn ligament. This may be done in severe cases. Follow these instructions at home: If you have a splint or brace:  Wear the splint or brace as told by your health care provider. Remove it only as told  by your health care provider.  Loosen the splint or brace if your toes become numb and tingle, or if they turn cold and blue.  Follow instructions from your health care provider about how to care for your splint or brace.  Keep the splint or brace clean. Managing pain, stiffness, and swelling  If directed, apply ice to the injured area.  Put ice in a plastic bag.  Place a towel between your skin and the bag.  Leave the ice on for 20 minutes, 2-3 times per day.  Move your toes often to avoid stiffness and to lessen swelling.  Raise (elevate) the injured area above the level of your heart while you are sitting or lying down. Driving  Do not drive or operate heavy machinery while taking prescription pain medicine.  Ask your health care provider when it is safe to drive if you have a splint or brace on your knee. Activity  Return to your normal activities as told by your health care provider. Ask your health care provider what activities are safe for you.  Perform exercises daily as told by your health care provider or physical therapist. General instructions  Take  over-the-counter and prescription medicines only as told by your health care provider.  Do not take baths, swim, or use a hot tub until your health care provider approves. Ask your health care provider if you can take showers. You may only be allowed to take sponge baths for bathing.  Do not use the injured limb to support your body weight until your health care provider says that you can.  Keep all follow-up visits as told by your health care provider. This is important. Contact a health care provider if:  Your symptoms do not improve.  Your symptoms get worse.  You develop tingling or numbness in the area of your injury. Get help right away if:  You develop severe numbness or tingling in your leg or foot.  Your foot turns blue, white, or gray, and it feels cold. This information is not intended to replace  advice given to you by your health care provider. Make sure you discuss any questions you have with your health care provider. Document Released: 10/06/2005 Document Revised: 03/13/2016 Document Reviewed: 12/08/2014  2017 Elsevier

## 2016-11-23 DIAGNOSIS — J309 Allergic rhinitis, unspecified: Secondary | ICD-10-CM | POA: Insufficient documentation

## 2016-11-23 NOTE — Assessment & Plan Note (Signed)
Using anti-histamine

## 2016-11-27 DIAGNOSIS — J3089 Other allergic rhinitis: Secondary | ICD-10-CM | POA: Diagnosis not present

## 2016-12-11 DIAGNOSIS — J3089 Other allergic rhinitis: Secondary | ICD-10-CM | POA: Diagnosis not present

## 2016-12-18 DIAGNOSIS — J3089 Other allergic rhinitis: Secondary | ICD-10-CM | POA: Diagnosis not present

## 2016-12-24 DIAGNOSIS — J3089 Other allergic rhinitis: Secondary | ICD-10-CM | POA: Diagnosis not present

## 2017-01-01 DIAGNOSIS — H5213 Myopia, bilateral: Secondary | ICD-10-CM | POA: Diagnosis not present

## 2017-01-14 DIAGNOSIS — J3089 Other allergic rhinitis: Secondary | ICD-10-CM | POA: Diagnosis not present

## 2017-01-21 LAB — OB RESULTS CONSOLE GBS: GBS: POSITIVE

## 2017-01-30 DIAGNOSIS — J3089 Other allergic rhinitis: Secondary | ICD-10-CM | POA: Diagnosis not present

## 2017-02-04 DIAGNOSIS — Z01419 Encounter for gynecological examination (general) (routine) without abnormal findings: Secondary | ICD-10-CM | POA: Diagnosis not present

## 2017-02-09 DIAGNOSIS — J3089 Other allergic rhinitis: Secondary | ICD-10-CM | POA: Diagnosis not present

## 2017-02-13 ENCOUNTER — Encounter: Payer: Self-pay | Admitting: Family Medicine

## 2017-02-13 ENCOUNTER — Ambulatory Visit (INDEPENDENT_AMBULATORY_CARE_PROVIDER_SITE_OTHER): Payer: 59 | Admitting: Family Medicine

## 2017-02-13 VITALS — BP 110/70 | HR 104 | Temp 98.3°F | Resp 16 | Ht 63.0 in | Wt 200.5 lb

## 2017-02-13 DIAGNOSIS — J343 Hypertrophy of nasal turbinates: Secondary | ICD-10-CM

## 2017-02-13 DIAGNOSIS — J069 Acute upper respiratory infection, unspecified: Secondary | ICD-10-CM | POA: Diagnosis not present

## 2017-02-13 DIAGNOSIS — J309 Allergic rhinitis, unspecified: Secondary | ICD-10-CM

## 2017-02-13 MED ORDER — HYDROXYZINE HCL 25 MG PO TABS: 25.0000 mg | ORAL_TABLET | Freq: Three times a day (TID) | ORAL | 2 refills | Status: DC | PRN

## 2017-02-13 MED ORDER — PREDNISONE 10 MG PO TABS: 10.0000 mg | ORAL_TABLET | Freq: Every day | ORAL | 0 refills | Status: DC

## 2017-02-13 NOTE — Progress Notes (Addendum)
Name: Sheri Malone   MRN: [id number]    DOB: July 17, 1986   Date:02/13/2017       Progress Note  Subjective  Chief Complaint  Chief Complaint  Patient presents with  . URI    cough, congested for 1 week    HPI   URI: Pt presents with c/o URI - she notes last Thursday (8 days) her allergies worsened because she was working outside in the yard, and symptoms have worsened/continued since then. Symptoms include sinus congestion, sinus headache, congested cough, some shortness of breath with cough.  Has been taking Nyquil and Mucinex with minimal relief.   Allergies: She is receiving allergy shots weekly from allergist. She requests Atarax refill. Allergies are not well controlled, but she is taking Xyzal, singulair, Atarax, flonase, and azelastine.  Patient Active Problem List   Diagnosis Date Noted  . Allergic rhinitis 11/23/2016  . Allergic conjunctivitis 01/17/2016  . Mild intermittent asthma 01/17/2016  . Dermatitis, eczematoid 06/21/2015  . H/O cesarean section 06/21/2015  . History of diabetes mellitus arising in pregnancy 06/21/2015  . Headache, migraine 06/21/2015  . Urticaria, acute 06/21/2015    Social History  Substance Use Topics  . Smoking status: Never Smoker  . Smokeless tobacco: Never Used  . Alcohol use 0.0 oz/week     Comment: occasionally     Current Outpatient Prescriptions:  .  azelastine (ASTELIN) 0.1 % nasal spray, Place 2 sprays into both nostrils 2 (two) times daily. Use in each nostril as directed, Disp: 30 mL, Rfl: 2 .  EPINEPHrine (EPIPEN 2-PAK) 0.3 mg/0.3 mL IJ SOAJ injection, Inject 0.3 mLs (0.3 mg total) into the muscle once., Disp: 1 Device, Rfl: 0 .  fluticasone (FLONASE) 50 MCG/ACT nasal spray, Place 2 sprays into both nostrils daily., Disp: 16 g, Rfl: 0 .  hydrOXYzine (ATARAX/VISTARIL) 25 MG tablet, Take 1 tablet (25 mg total) by mouth 3 (three) times daily as needed., Disp: 30 tablet, Rfl: 0 .  levocetirizine (XYZAL) 5 MG tablet, , Disp: ,  Rfl:  .  meloxicam (MOBIC) 15 MG tablet, Take 1 tablet (15 mg total) by mouth daily. If needed for pain, inflammation; take with food, Disp: 30 tablet, Rfl: 0 .  ranitidine (ZANTAC) 150 MG capsule, Take 150 mg by mouth 2 (two) times daily., Disp: , Rfl:  .  montelukast (SINGULAIR) 10 MG tablet, , Disp: , Rfl:   No Known Allergies  ROS  Constitutional: Negative for fever/chills or weight change.  HEENT: Per HPI Respiratory: Positive for cough and shortness of breath.   Cardiovascular: Negative for chest pain or palpitations.  Gastrointestinal: Negative for abdominal pain, no bowel changes. No NVD Musculoskeletal: Negative for gait problem or joint swelling.  Skin: Negative for rash.  Neurological: Negative for dizziness; positive for dull sinus headache.  No other specific complaints in a complete review of systems (except as listed in HPI above).  Objective  Vitals:   02/13/17 1334  BP: 110/70  Pulse: (!) 104  Resp: 16  Temp: 98.3 F (36.8 C)  TempSrc: Oral  SpO2: 98%  Weight: 200 lb 8 oz (90.9 kg)  Height:  (1.6 m)    Body mass index is 35.52 kg/m.  Nursing Note and Vital Signs reviewed.  Physical Exam  Constitutional: Patient appears well-developed and well-nourished. Obese No distress.  HEENT: head atraumatic, normocephalic, pupils equal and reactive to light, EOM's intact, Right TM with bulging but no erythema, Left TM without erythema or bulging, no maxillary or frontal sinus pain  on palpation, nasal turbinates significantly boggy; neck supple without lymphadenopathy, oropharynx pink and moist without exudate. Pt coughing during examination Cardiovascular: Normal rate, regular rhythm, S1/S2 present.  No murmur or rub heard. No BLE edema. Pulmonary/Chest: Effort normal and breath sounds clear. No respiratory distress or retractions. Abdominal: Soft and non-tender, bowel sounds present x4 quadrants. Psychiatric: Patient has a normal mood and affect. behavior is  normal. Judgment and thought content normal.  No results found for this or any previous visit (from the past 2160 hour(s)).   Assessment & Plan  1. Viral upper respiratory tract infection  - predniSONE (DELTASONE) 10 MG tablet; Take 1 tablet (10 mg total) by mouth daily with breakfast. Take 5 on day 1, 4 on day 2, 3 on day 3, 2 on day 4, and 1 on day 5.  Dispense: 15 tablet; Refill: 0 -Push fluids -RTC or call in 3-5 days if not improving with symptom management and will consider sending in an antibiotic. -Declines tessalon and will continue OTC medications for symptoms relief.  2. Allergic rhinitis, unspecified seasonality, unspecified trigger  - hydrOXYzine (ATARAX/VISTARIL) 25 MG tablet; Take 1 tablet (25 mg total) by mouth 3 (three) times daily as needed.  Dispense: 30 tablet; Refill: 2 - Ambulatory referral to ENT  3. Nasal turbinate hypertrophy  - Ambulatory referral to ENT - predniSONE (DELTASONE) 10 MG tablet; Take 1 tablet (10 mg total) by mouth daily with breakfast. Take 5 on day 1, 4 on day 2, 3 on day 3, 2 on day 4, and 1 on day 5.  Dispense: 15 tablet; Refill: 0   -Red flags and when to present for emergency care or RTC including fever >101.46F, chest pain, shortness of breath, new/worsening/un-resolving symptoms  reviewed with patient at time of visit. Follow up and care instructions discussed and provided in AVS. -Reviewed Health Maintenance: Has been seeing OB/GYN for annual physicals. -Follow up in 3-5 days if no improvement, otherwise PRN.  I have reviewed this encounter including the documentation in this note and/or discussed this patient with the Deboraha Sprang, FNP, NP-C. I am certifying that I agree with the content of this note as supervising physician.  Alba Cory, MD [facility] 02/13/2017, 3:20 PM

## 2017-02-18 ENCOUNTER — Telehealth: Payer: Self-pay

## 2017-02-18 DIAGNOSIS — J3089 Other allergic rhinitis: Secondary | ICD-10-CM | POA: Diagnosis not present

## 2017-02-18 NOTE — Telephone Encounter (Signed)
Scheduling information has been added to the referral that was placed to [facility]

## 2017-02-18 NOTE — Telephone Encounter (Signed)
-----   Message from Kerman Passey, MD sent at 02/18/2017 10:21 AM EDT ----- Regarding: ENT appt   ----- Message ----- From: Clare Gandy Sent: 02/18/2017  10:15 AM To: Kerman Passey, MD

## 2017-02-19 ENCOUNTER — Telehealth: Payer: Self-pay | Admitting: Family Medicine

## 2017-02-19 DIAGNOSIS — H66001 Acute suppurative otitis media without spontaneous rupture of ear drum, right ear: Secondary | ICD-10-CM

## 2017-02-19 MED ORDER — AMOXICILLIN-POT CLAVULANATE 875-125 MG PO TABS: 1.0000 | ORAL_TABLET | Freq: Two times a day (BID) | ORAL | 0 refills | Status: DC

## 2017-02-19 NOTE — Telephone Encounter (Signed)
Spoke with patient and she is still not feeling better, right ear has developed more pain and pressure.  Augmentin is sent in. She has ENT appt on 03/05/17.  Red flags and when to present for emergency care or RTC including fever >101.59F, chest pain, shortness of breath, new/worsening/un-resolving symptoms, reviewed with patient at time of visit. Follow up in office in 3-5 days if still not improving.

## 2017-02-19 NOTE — Telephone Encounter (Signed)
Pt states she was here last Friday and was told that if she was not feeling better that something else could be called in. [facility] [address] Congested, a lot of Mucus. Please advise.

## 2017-02-24 DIAGNOSIS — J3089 Other allergic rhinitis: Secondary | ICD-10-CM | POA: Diagnosis not present

## 2017-03-05 DIAGNOSIS — J301 Allergic rhinitis due to pollen: Secondary | ICD-10-CM | POA: Diagnosis not present

## 2017-03-05 DIAGNOSIS — J3489 Other specified disorders of nose and nasal sinuses: Secondary | ICD-10-CM | POA: Diagnosis not present

## 2017-03-05 DIAGNOSIS — J343 Hypertrophy of nasal turbinates: Secondary | ICD-10-CM | POA: Diagnosis not present

## 2017-03-10 DIAGNOSIS — J3089 Other allergic rhinitis: Secondary | ICD-10-CM | POA: Diagnosis not present

## 2017-03-26 DIAGNOSIS — J3089 Other allergic rhinitis: Secondary | ICD-10-CM | POA: Diagnosis not present

## 2017-04-01 ENCOUNTER — Ambulatory Visit
Admission: RE | Admit: 2017-04-01 | Discharge: 2017-04-01 | Disposition: A | Discharge status: Home / Self Care | Payer: 59 | Source: Ambulatory Visit | Attending: Family Medicine | Admitting: Family Medicine

## 2017-04-01 ENCOUNTER — Encounter: Payer: Self-pay | Admitting: Family Medicine

## 2017-04-01 ENCOUNTER — Ambulatory Visit (INDEPENDENT_AMBULATORY_CARE_PROVIDER_SITE_OTHER): Payer: 59 | Admitting: Family Medicine

## 2017-04-01 VITALS — BP 116/76 | HR 95 | Temp 98.4°F | Resp 14 | Wt 200.8 lb

## 2017-04-01 DIAGNOSIS — M5412 Radiculopathy, cervical region: Secondary | ICD-10-CM | POA: Diagnosis not present

## 2017-04-01 DIAGNOSIS — M542 Cervicalgia: Secondary | ICD-10-CM | POA: Diagnosis not present

## 2017-04-01 DIAGNOSIS — Z3202 Encounter for pregnancy test, result negative: Secondary | ICD-10-CM | POA: Diagnosis not present

## 2017-04-01 LAB — POCT URINE PREGNANCY: Preg Test, Ur: NEGATIVE

## 2017-04-01 MED ORDER — PREDNISONE 10 MG PO TABS: ORAL_TABLET | ORAL | 0 refills | Status: DC

## 2017-04-01 MED ORDER — CYCLOBENZAPRINE HCL 10 MG PO TABS: 10.0000 mg | ORAL_TABLET | Freq: Three times a day (TID) | ORAL | 0 refills | Status: DC | PRN

## 2017-04-01 NOTE — Patient Instructions (Signed)
Please go across the street for the xrays Start the prednisone today No other NSAIDs until you finish the prednisone Okay to take tylenol Use the muscle relaxer if needed Try gentle neck stretches Let me know Monday or Tuesday how you're doing and proceed with further testing if needed

## 2017-04-01 NOTE — Progress Notes (Signed)
BP 116/76   Pulse 95   Temp 98.4 F (36.9 C) (Oral)   Resp 14   Wt 200 lb 12.8 oz (91.1 kg)   LMP 03/19/2017   SpO2 97%   BMI 35.57 kg/m    Subjective:    Patient ID: Sheri Malone, female    DOB: [birth year], 31 y.o.   MRN: [id number]  HPI: Sheri Malone is a 31 y.o. female  Chief Complaint  Patient presents with  . Neck Pain    ongoing for a week in half getting worse   HPI Patient is here for an acute visit Neck pain going on for 1-1/2 weeks Right side of the neck, right shoulder, and up along the right side of the head No known arthritis or bone spurs or old injuries Little bit of pain and weakness, shooting pain down the right arm; right-handed; tingling in the fingers is positional Sleeping at night is tough; has to sleep sitting up; laying on the left causes a pulling sensation, but can't sleep on the right either Tried heating pad, had some 800 mg ibuprofen left over and tried that; also tried muscle relaxers one night Saw Dr. Sherley Bounds for this previously; neck was swollen, whole right side neck, right side of face Round of prednisone in April for a sinus infection No rash this time  Depression screen [facility] 2/9 04/01/2017 11/14/2016 06/05/2016 01/17/2016 06/21/2015  Decreased Interest 0 0 0 0 0  Down, Depressed, Hopeless 0 0 0 0 0  PHQ - 2 Score 0 0 0 0 0   Relevant past medical, surgical, family and social history reviewed Past Medical History:  Diagnosis Date  . Abnormal Pap smear   . GERD (gastroesophageal reflux disease)   . Gestational HTN   . Headache(784.0)   . History of ectopic pregnancy   . History of gestational diabetes   . Hypertension   . Low hemoglobin    on ironspan  . Rhinitis    Past Surgical History:  Procedure Laterality Date  . CESAREAN SECTION N/A 05/20/2013   Procedure: CESAREAN SECTION;  Surgeon: Genia Del, MD;  Location: [facility];  Service: Obstetrics;  Laterality: N/A;  primary  . DILATION AND CURETTAGE OF UTERUS  2009  .  SALPINGOOPHORECTOMY  2009   left  . WISDOM TOOTH EXTRACTION     Family History  Problem Relation Age of Onset  . Diabetes Father   . Hypertension Father   . Cancer Maternal Grandfather        prostate  . Heart disease Paternal Grandfather   . Multiple sclerosis Sister    Social History   Social History  . Marital status: Married    Spouse name: N/A  . Number of children: N/A  . Years of education: N/A   Occupational History  . Not on file.   Social History Main Topics  . Smoking status: Never Smoker  . Smokeless tobacco: Never Used  . Alcohol use 0.0 oz/week     Comment: occasionally  . Drug use: No  . Sexual activity: Yes    Partners: Male    Birth control/ protection: Pill   Other Topics Concern  . Not on file   Social History Narrative  . No narrative on file    Interim medical history since last visit reviewed. Allergies and medications reviewed  Review of Systems Per HPI unless specifically indicated above     Objective:    BP 116/76   Pulse 95   Temp 98.4  F (36.9 C) (Oral)   Resp 14   Wt 200 lb 12.8 oz (91.1 kg)   LMP 03/19/2017   SpO2 97%   BMI 35.57 kg/m   Wt Readings from Last 3 Encounters:  04/01/17 200 lb 12.8 oz (91.1 kg)  02/13/17 200 lb 8 oz (90.9 kg)  11/14/16 196 lb 8 oz (89.1 kg)    Physical Exam  Constitutional: She appears well-developed and well-nourished.  HENT:  Right Ear: Tympanic membrane and ear canal normal.  Left Ear: Tympanic membrane and ear canal normal.  Nose: No rhinorrhea.  Mouth/Throat: Oropharynx is clear and moist and mucous membranes are normal.  Eyes: EOM are normal. No scleral icterus.  Neck: Muscular tenderness present. No spinous process tenderness present. Decreased range of motion present. No erythema present.    Cardiovascular: Normal rate and regular rhythm.   Pulmonary/Chest: Effort normal and breath sounds normal.  Neurological: She displays no atrophy.  UE strength 5/5 on the right and  left; light touch decreased on the RIGHT relative to the left  Psychiatric: She has a normal mood and affect. Her behavior is normal. Her mood appears not anxious. She does not exhibit a depressed mood.    Results for orders placed or performed in visit on 04/01/17  POCT urine pregnancy  Result Value Ref Range   Preg Test, Ur Negative Negative      Assessment & Plan:   Problem List Items Addressed This Visit    None    Visit Diagnoses    Cervical radicular pain    -  Primary   prednisone, muscle relaxant, if not improving quickly, contact me for further work-up; she requested xray today; preg test negative   Relevant Medications   predniSONE (DELTASONE) 10 MG tablet   cyclobenzaprine (FLEXERIL) 10 MG tablet   Other Relevant Orders   DG Cervical Spine Complete   Encounter for pregnancy test, result negative       Relevant Orders   POCT urine pregnancy (Completed)       Follow up plan: No Follow-up on file.  An after-visit summary was printed and given to the patient at check-out.  Please see the patient instructions which may contain other information and recommendations beyond what is mentioned above in the assessment and plan.  Meds ordered this encounter  Medications  . predniSONE (DELTASONE) 10 MG tablet    Sig: Five pills by mouth today, then 4 on day 2, 3 on day 3, 2 on day 4, 1 on day 5; take with food    Dispense:  15 tablet    Refill:  0  . cyclobenzaprine (FLEXERIL) 10 MG tablet    Sig: Take 1 tablet (10 mg total) by mouth every 8 (eight) hours as needed for muscle spasms.    Dispense:  21 tablet    Refill:  0    Orders Placed This Encounter  Procedures  . DG Cervical Spine Complete  . POCT urine pregnancy

## 2017-04-03 DIAGNOSIS — J3089 Other allergic rhinitis: Secondary | ICD-10-CM | POA: Diagnosis not present

## 2017-04-06 ENCOUNTER — Telehealth: Payer: Self-pay

## 2017-04-06 DIAGNOSIS — M542 Cervicalgia: Secondary | ICD-10-CM | POA: Diagnosis not present

## 2017-04-06 NOTE — Telephone Encounter (Signed)
Left a detail voicemail.  

## 2017-04-06 NOTE — Telephone Encounter (Signed)
Please thank her for letting me know I'm sorry to hear that she's not improving [facility] has a walk-in clinic I believe every day, M-F I'll suggest she go there today to be seen, as I would have expected my treatment to have helped She may need an MRI or injection, so please suggest she go to ortho this afternoon Keep us posted if their treatments aren't helping or if we need to help in any other way Thank you

## 2017-04-06 NOTE — Telephone Encounter (Signed)
Pt still experiencing the same pain on the same side of her neck. Pt states it still feel like its a bad headache. she completed the prednisone Course but Flexeril not working. Please advise

## 2017-04-08 DIAGNOSIS — J3089 Other allergic rhinitis: Secondary | ICD-10-CM | POA: Diagnosis not present

## 2017-04-10 DIAGNOSIS — M6281 Muscle weakness (generalized): Secondary | ICD-10-CM | POA: Diagnosis not present

## 2017-04-17 DIAGNOSIS — J3089 Other allergic rhinitis: Secondary | ICD-10-CM | POA: Diagnosis not present

## 2017-04-17 DIAGNOSIS — M542 Cervicalgia: Secondary | ICD-10-CM | POA: Diagnosis not present

## 2017-04-23 DIAGNOSIS — M542 Cervicalgia: Secondary | ICD-10-CM | POA: Diagnosis not present

## 2017-04-23 DIAGNOSIS — R600 Localized edema: Secondary | ICD-10-CM | POA: Diagnosis not present

## 2017-04-27 DIAGNOSIS — M542 Cervicalgia: Secondary | ICD-10-CM | POA: Diagnosis not present

## 2017-04-28 DIAGNOSIS — J3089 Other allergic rhinitis: Secondary | ICD-10-CM | POA: Diagnosis not present

## 2017-04-29 DIAGNOSIS — M542 Cervicalgia: Secondary | ICD-10-CM | POA: Diagnosis not present

## 2017-05-01 DIAGNOSIS — J3089 Other allergic rhinitis: Secondary | ICD-10-CM | POA: Diagnosis not present

## 2017-05-08 DIAGNOSIS — J3089 Other allergic rhinitis: Secondary | ICD-10-CM | POA: Diagnosis not present

## 2017-05-26 DIAGNOSIS — J3089 Other allergic rhinitis: Secondary | ICD-10-CM | POA: Diagnosis not present

## 2017-06-05 DIAGNOSIS — J3089 Other allergic rhinitis: Secondary | ICD-10-CM | POA: Diagnosis not present

## 2017-06-25 DIAGNOSIS — J3089 Other allergic rhinitis: Secondary | ICD-10-CM | POA: Diagnosis not present

## 2017-07-10 DIAGNOSIS — J3089 Other allergic rhinitis: Secondary | ICD-10-CM | POA: Diagnosis not present

## 2017-07-17 DIAGNOSIS — J3089 Other allergic rhinitis: Secondary | ICD-10-CM | POA: Diagnosis not present

## 2017-07-22 LAB — OB RESULTS CONSOLE HIV ANTIBODY (ROUTINE TESTING): HIV: NONREACTIVE

## 2017-07-22 LAB — OB RESULTS CONSOLE HEPATITIS B SURFACE ANTIGEN: Hepatitis B Surface Ag: NEGATIVE

## 2017-07-22 LAB — OB RESULTS CONSOLE ANTIBODY SCREEN: Antibody Screen: NEGATIVE

## 2017-07-22 LAB — OB RESULTS CONSOLE ABO/RH: RH Type: POSITIVE

## 2017-07-22 LAB — OB RESULTS CONSOLE RPR: RPR: NONREACTIVE

## 2017-07-22 LAB — OB RESULTS CONSOLE RUBELLA ANTIBODY, IGM: Rubella: IMMUNE

## 2017-07-22 LAB — OB RESULTS CONSOLE GC/CHLAMYDIA
Chlamydia: NEGATIVE
Gonorrhea: NEGATIVE

## 2017-08-12 DIAGNOSIS — J3089 Other allergic rhinitis: Secondary | ICD-10-CM | POA: Diagnosis not present

## 2017-09-15 ENCOUNTER — Ambulatory Visit: Payer: 59 | Admitting: Neurology

## 2017-09-15 ENCOUNTER — Encounter (INDEPENDENT_AMBULATORY_CARE_PROVIDER_SITE_OTHER): Payer: Self-pay

## 2017-09-15 ENCOUNTER — Encounter: Payer: Self-pay | Admitting: Neurology

## 2017-09-15 VITALS — BP 116/79 | HR 96 | Ht 63.0 in | Wt 209.0 lb

## 2017-09-15 DIAGNOSIS — O26892 Other specified pregnancy related conditions, second trimester: Secondary | ICD-10-CM

## 2017-09-15 DIAGNOSIS — R519 Headache, unspecified: Secondary | ICD-10-CM

## 2017-09-15 DIAGNOSIS — R51 Headache with orthostatic component, not elsewhere classified: Secondary | ICD-10-CM

## 2017-09-15 DIAGNOSIS — H539 Unspecified visual disturbance: Secondary | ICD-10-CM

## 2017-09-15 MED ORDER — CYPROHEPTADINE HCL 4 MG PO TABS: ORAL_TABLET | ORAL | 6 refills | Status: DC

## 2017-09-15 NOTE — Patient Instructions (Addendum)
There are limited treatments in pregnancy and all have risks.  Tylenol is generally accepted as safe in pregnancy. Periactin (Cyproheptadine) is class B which is likely compatible without any data for teratogenicity however no medication is entriely safe and there is limited data in pregnancy.  Discussed at length with patient. Do not take more than needed.   Other interventions to try and decreased the use of medication for headache/migraine include:  Cool Compress. Lie down and place a cool compress on your head.  Avoid headache triggers. If certain foods or odors seem to have triggered your migraines in the past, avoid them. A headache diary might help you identify triggers.  Include physical activity in your daily routine. Try a daily walk or other moderate aerobic exercise.  Manage stress. Find healthy ways to cope with the stressors, such as delegating tasks on your to-do list.  Practice relaxation techniques. Try deep breathing, yoga, massage and visualization.  Eat regularly. Eating regularly scheduled meals and maintaining a healthy diet might help prevent headaches. Also, drink plenty of fluids.  Follow a regular sleep schedule. Sleep deprivation might contribute to headaches  Consider biofeedback. With this mind-body technique, you learn to control certain bodily functions - such as muscle tension, heart rate and blood pressure - to prevent headaches or reduce headache pain.  Headaches during pregnancy are common. However, if you develop a severe headache or a headache that doesn't go away, call your health care provider. Severe headaches can be a sign of a pregnancy complication       Cyproheptadine tablets What is this medicine? CYPROHEPTADINE (si proe HEP ta deen) is a antihistamine. This medicine is used to treat allergy symptoms and migraines. It is can help stop runny nose, watery eyes, and itchy rash. This medicine may be used for other purposes; ask your health care provider or  pharmacist if you have questions. COMMON BRAND NAME(S): Periactin What should I tell my health care provider before I take this medicine? They need to know if you have any of these conditions: -any chronic disease -glaucoma -prostate disease -ulcers or other stomach problems -an unusual or allergic reaction to cyproheptadine, other medicines foods, dyes, or preservatives -pregnant or trying to get pregnant (no medication is entirely safe in pregnancy, this medication is class B likely compatible) -breast-feeding How should I use this medicine? Take this medicine by mouth with a glass of water. Follow the directions on the prescription label. Take your doses at regular intervals. Do not take your medicine more often than directed. Talk to your pediatrician regarding the use of this medicine in children. While this drug may be prescribed for children as young as 582 years of age for selected conditions, precautions do apply. Overdosage: If you think you have taken too much of this medicine contact a poison control center or emergency room at once. NOTE: This medicine is only for you. Do not share this medicine with others. What if I miss a dose? If you miss a dose, take it as soon as you can. If it is almost time for your next dose, take only that dose. Do not take double or extra doses. What may interact with this medicine? Do not take this medicine with any of the following medications: -MAOIs like Carbex, Eldepryl, Marplan, Nardil, and Parnate This medicine may also interact with the following medications: -alcohol -barbiturate medicines for inducing sleep or treating seizures -medicines for depression, anxiety or psychotic disturbances -medicines for movement abnormalities -medicines for sleep -medicines  for stomach problems -some medicines for cold or allergies This list may not describe all possible interactions. Give your health care provider a list of all the medicines, herbs,  non-prescription drugs, or dietary supplements you use. Also tell them if you smoke, drink alcohol, or use illegal drugs. Some items may interact with your medicine. What should I watch for while using this medicine? Visit your doctor or health care professional for regular check ups. Tell your doctor if your symptoms do not improve or if they get worse. You may get drowsy or dizzy. Do not drive, use machinery, or do anything that needs mental alertness until you know how this medicine affects you. Do not stand or sit up quickly, especially if you are an older patient. This reduces the risk of dizzy or fainting spells. Alcohol may interfere with the effect of this medicine. Avoid alcoholic drinks. Your mouth may get dry. Chewing sugarless gum or sucking hard candy, and drinking plenty of water may help. Contact your doctor if the problem does not go away or is severe. This medicine may cause dry eyes and blurred vision. If you wear contact lenses you may feel some discomfort. Lubricating drops may help. See your eye doctor if the problem does not go away or is severe. This medicine can make you more sensitive to the sun. Keep out of the sun. If you cannot avoid being in the sun, wear protective clothing and use sunscreen. Do not use sun lamps or tanning beds/booths. What side effects may I notice from receiving this medicine? Side effects that you should report to your doctor or health care professional as soon as possible: -allergic reactions like skin rash, itching or hives, swelling of the face, lips, or tongue -agitation, nervousness, excitability, not able to sleep -chest pain -irregular, fast heartbeat -pain or difficulty passing urine -seizures -unusual bleeding or bruising -unusually weak or tired -yellowing of the eyes or skin Side effects that usually do not require medical attention (report to your doctor or health care professional if they continue or are bothersome): -constipation or  diarrhea -headache -loss of appetite -nausea, vomiting -stomach upset -weight gain This list may not describe all possible side effects. Call your doctor for medical advice about side effects. You may report side effects to FDA at 1-800-FDA-1088. Where should I keep my medicine? Keep out of the reach of children. Store at room temperature between 15 and 30 degrees C (59 and 86 degrees F). Keep container tightly closed. Throw away any unused medicine after the expiration date. NOTE: This sheet is a summary. It may not cover all possible information. If you have questions about this medicine, talk to your doctor, pharmacist, or health care provider.  2018 Elsevier/Gold Standard (2008-01-10 16:29:53)

## 2017-09-15 NOTE — Progress Notes (Signed)
[facility]    Provider:  Dr Lucia GaskinsAhern Referring Provider: Kerman PasseyLada, Melinda P, MD Primary Care Physician:  Kerman PasseyLada, Melinda P, MD  CC:  Headaches  HPI:  Sheri Malone is a 31 y.o. female here as a referral from Dr. Sherie DonLada for headaches. Patient is approx [redacted] weeks pregnant.  She has had right arm and neck pain, had normal c-spine xrays, She has a history of migraines starting in adolescence. Birth control helped. She has used Fioricet in the past. Headaches are worsening. A few months ago she had a shoulder injury and PT has helped. These headaches are different, not eating triggers it, not sleeping well triggers headaches. Mother with migraines. It can start anywhere in the head, behind the eyes, can be unilateral, she has light/sound sensitivity, being in the dark helps, positional laying down makes it worse, movement makes it worse, sitting straight in a dark place is the best. Worsening, weekly. Could last 2 days. She has headaches 20 days a month and over half are migrainous. She has associated nausea. She has vision changes with the migraines. No hearing changes, no pulsating in the ears.   Reviewed notes, labs and imaging from outside physicians, which showed:  Personally reviewed images and agree with the following:  There is no evidence of cervical spine fracture or prevertebral soft tissue swelling. Alignment is normal. No other significant bone abnormalities are identified.  IMPRESSION: Negative cervical spine radiographs.  CBC with anemia  Review of Systems: Patient complains of symptoms per HPI as well as the following symptoms: headache. Pertinent negatives and positives per HPI. All others negative.   Social History   Socioeconomic History  . Marital status: Married    Spouse name: Not on file  . Number of children: Not on file  . Years of education: Not on file  . Highest education level: Not on file  Social Needs  . Financial resource strain: Not on file    . Food insecurity - worry: Not on file  . Food insecurity - inability: Not on file  . Transportation needs - medical: Not on file  . Transportation needs - non-medical: Not on file  Occupational History  . Not on file  Tobacco Use  . Smoking status: Never Smoker  . Smokeless tobacco: Never Used  Substance and Sexual Activity  . Alcohol use: Yes    Alcohol/week: 0.0 oz    Comment: occasionally  . Drug use: No  . Sexual activity: Yes    Partners: Male    Birth control/protection: Pill  Other Topics Concern  . Not on file  Social History Narrative  . Not on file    Family History  Problem Relation Age of Onset  . Diabetes Father   . Hypertension Father   . Cancer Maternal Grandfather        prostate  . Heart disease Paternal Grandfather   . Migraines Mother   . Multiple sclerosis Sister     Past Medical History:  Diagnosis Date  . Abnormal Pap smear   . GERD (gastroesophageal reflux disease)   . Gestational HTN   . Headache(784.0)   . History of ectopic pregnancy   . History of gestational diabetes   . Hypertension   . Low hemoglobin    on ironspan  . Rhinitis     Past Surgical History:  Procedure Laterality Date  . CESAREAN SECTION N/A 05/20/2013   Procedure: CESAREAN SECTION;  Surgeon: Genia DelMarie-Lyne Lavoie, MD;  Location: [facility];  Service: Obstetrics;  Laterality: N/A;  primary  . DILATION AND CURETTAGE OF UTERUS  2009  . SALPINGOOPHORECTOMY  2009   left  . WISDOM TOOTH EXTRACTION      Current Outpatient Medications  Medication Sig Dispense Refill  . butalbital-acetaminophen-caffeine (FIORICET, ESGIC) 50-325-40 MG tablet Take 1 tablet by mouth 2 (two) times daily as needed for headache.    . doxylamine, Sleep, (UNISOM) 25 MG tablet Take 25 mg by mouth at bedtime as needed.    Marland Kitchen. EPINEPHrine (EPIPEN 2-PAK) 0.3 mg/0.3 mL IJ SOAJ injection Inject 0.3 mLs (0.3 mg total) into the muscle once. 1 Device 0  . Magnesium Citrate 100 MG TABS Take 1 tablet by mouth daily  as needed.    . Prenat-Fe Carbonyl-FA-Omega 3 (ONE-A-DAY WOMENS PRENATAL 1 PO) Take 1 tablet by mouth daily.    . ranitidine (ZANTAC) 150 MG capsule Take 150 mg by mouth 2 (two) times daily.    . cyproheptadine (PERIACTIN) 4 MG tablet May take 1-2 tablets up to 3x a day. Take at the onset of headache. 90 tablet 6   No current facility-administered medications for this visit.     Allergies as of 09/15/2017  . (No Known Allergies)    Vitals: BP 116/79   Pulse 96   Ht 5\' 3"  (1.6 m)   Wt 209 lb (94.8 kg)   BMI 37.02 kg/m  Last Weight:  Wt Readings from Last 1 Encounters:  09/15/17 209 lb (94.8 kg)   Last Height:   Ht Readings from Last 1 Encounters:  09/15/17 5\' 3"  (1.6 m)   Physical exam: Exam: Gen: NAD, conversant, well nourised, obese, well groomed                     CV: RRR, no MRG. No Carotid Bruits. No peripheral edema, warm, nontender Eyes: Conjunctivae clear without exudates or hemorrhage  Neuro: Detailed Neurologic Exam  Speech:    Speech is normal; fluent and spontaneous with normal comprehension.  Cognition:    The patient is oriented to person, place, and time;     recent and remote memory intact;     language fluent;     normal attention, concentration,     fund of knowledge Cranial Nerves:    The pupils are equal, round, and reactive to light. The fundi are normal and spontaneous venous pulsations are present. Visual fields are full to finger confrontation. Extraocular movements are intact. Trigeminal sensation is intact and the muscles of mastication are normal. The face is symmetric. The palate elevates in the midline. Hearing intact. Voice is normal. Shoulder shrug is normal. The tongue has normal motion without fasciculations.   Coordination:    Normal finger to nose and heel to shin. Normal rapid alternating movements.   Gait:    Heel-toe and tandem gait are normal.   Motor Observation:    No asymmetry, no atrophy, and no involuntary movements  noted. Tone:    Normal muscle tone.    Posture:    Posture is normal. normal erect    Strength:    Strength is V/V in the upper and lower limbs.      Sensation: intact to LT     Reflex Exam:  DTR's:    Deep tendon reflexes in the upper and lower extremities are normal bilaterally.   Toes:    The toes are downgoing bilaterally.   Clonus:    Clonus is absent.       Assessment/Plan:  Patient with chronic  migraines without aura, worsened in pregnancy. Given the vision changes and positional quality (worse laying down) which is unusual in migraines, need MRI of the brain to evaluate for space occupying lesions, pituitary hyperplasia, other lesions or compressive masses.   There are limited treatments in pregnancy and all have risks.  Tylenol is generally accepted as safe in pregnancy. Periactin (Cyproheptadine) is class B which is likely compatible without any data for teratogenicity however no medication is entriely safe.  Discussed at length with patient. Do not take more than needed.   Other interventions to try and decreased the use of medication for headache/migraine include:  Cool Compress. Lie down and place a cool compress on your head.  Avoid headache triggers. If certain foods or odors seem to have triggered your migraines in the past, avoid them. A headache diary might help you identify triggers.  Include physical activity in your daily routine. Try a daily walk or other moderate aerobic exercise.  Manage stress. Find healthy ways to cope with the stressors, such as delegating tasks on your to-do list.  Practice relaxation techniques. Try deep breathing, yoga, massage and visualization.  Eat regularly. Eating regularly scheduled meals and maintaining a healthy diet might help prevent headaches. Also, drink plenty of fluids.  Follow a regular sleep schedule. Sleep deprivation might contribute to headaches  Consider biofeedback. With this mind-body technique, you learn to  control certain bodily functions - such as muscle tension, heart rate and blood pressure - to prevent headaches or reduce headache pain.  Headaches during pregnancy are common. However, if you develop a severe headache or a headache that doesn't go away, call your health care provider. Severe headaches can be a sign of a pregnancy complication    Naomie Dean, MD  [facility] [address] [place], [place] [address]  Phone [phone] Fax [phone]  Cc: Kerman Passey, MD, Dr. Ernestina Penna

## 2017-09-22 ENCOUNTER — Encounter: Payer: Self-pay | Admitting: Radiology

## 2017-09-22 ENCOUNTER — Ambulatory Visit (INDEPENDENT_AMBULATORY_CARE_PROVIDER_SITE_OTHER): Payer: 59

## 2017-09-22 DIAGNOSIS — H539 Unspecified visual disturbance: Secondary | ICD-10-CM | POA: Diagnosis not present

## 2017-09-22 DIAGNOSIS — R51 Headache with orthostatic component, not elsewhere classified: Secondary | ICD-10-CM

## 2017-09-22 DIAGNOSIS — O26892 Other specified pregnancy related conditions, second trimester: Secondary | ICD-10-CM

## 2017-09-22 DIAGNOSIS — R519 Headache, unspecified: Secondary | ICD-10-CM

## 2017-09-24 ENCOUNTER — Telehealth: Payer: Self-pay | Admitting: *Deleted

## 2017-09-24 NOTE — Telephone Encounter (Addendum)
Called and LVM (ok per DPR) informing patient of normal MRI brain. Left office number in case she needed to call back.    ----- Message from Anson FretAntonia B Ahern, MD sent at 09/24/2017  8:27 AM EST ----- MRI brain normal

## 2017-10-13 ENCOUNTER — Inpatient Hospital Stay (HOSPITAL_COMMUNITY)
Admission: AD | Admit: 2017-10-13 | Discharge: 2017-10-14 | Disposition: A | Discharge status: Home / Self Care | Payer: 59 | Source: Ambulatory Visit | Attending: Obstetrics and Gynecology | Admitting: Obstetrics and Gynecology

## 2017-10-13 ENCOUNTER — Encounter (HOSPITAL_COMMUNITY): Payer: Self-pay

## 2017-10-13 DIAGNOSIS — K219 Gastro-esophageal reflux disease without esophagitis: Secondary | ICD-10-CM

## 2017-10-13 DIAGNOSIS — O99612 Diseases of the digestive system complicating pregnancy, second trimester: Secondary | ICD-10-CM | POA: Insufficient documentation

## 2017-10-13 DIAGNOSIS — K8020 Calculus of gallbladder without cholecystitis without obstruction: Secondary | ICD-10-CM | POA: Diagnosis not present

## 2017-10-13 DIAGNOSIS — Z3A22 22 weeks gestation of pregnancy: Secondary | ICD-10-CM | POA: Insufficient documentation

## 2017-10-13 DIAGNOSIS — O26892 Other specified pregnancy related conditions, second trimester: Secondary | ICD-10-CM | POA: Diagnosis present

## 2017-10-13 NOTE — MAU Note (Signed)
Pt here with c/o abdominal pain, comes and goes. Denies any bleeding or leaking of fluid. Reports good fetal movement.

## 2017-10-14 ENCOUNTER — Inpatient Hospital Stay (HOSPITAL_COMMUNITY): Payer: 59

## 2017-10-14 ENCOUNTER — Inpatient Hospital Stay (HOSPITAL_COMMUNITY)
Admission: AD | Admit: 2017-10-14 | Discharge: 2017-10-14 | Disposition: A | Discharge status: Home / Self Care | Payer: 59 | Source: Ambulatory Visit | Attending: Obstetrics and Gynecology | Admitting: Obstetrics and Gynecology

## 2017-10-14 ENCOUNTER — Encounter (HOSPITAL_COMMUNITY): Payer: Self-pay

## 2017-10-14 DIAGNOSIS — O26612 Liver and biliary tract disorders in pregnancy, second trimester: Secondary | ICD-10-CM | POA: Insufficient documentation

## 2017-10-14 DIAGNOSIS — Z331 Pregnant state, incidental: Secondary | ICD-10-CM

## 2017-10-14 DIAGNOSIS — K8020 Calculus of gallbladder without cholecystitis without obstruction: Secondary | ICD-10-CM | POA: Insufficient documentation

## 2017-10-14 DIAGNOSIS — R1011 Right upper quadrant pain: Secondary | ICD-10-CM | POA: Insufficient documentation

## 2017-10-14 DIAGNOSIS — O99612 Diseases of the digestive system complicating pregnancy, second trimester: Secondary | ICD-10-CM | POA: Insufficient documentation

## 2017-10-14 DIAGNOSIS — K219 Gastro-esophageal reflux disease without esophagitis: Secondary | ICD-10-CM | POA: Insufficient documentation

## 2017-10-14 DIAGNOSIS — Z3A22 22 weeks gestation of pregnancy: Secondary | ICD-10-CM | POA: Insufficient documentation

## 2017-10-14 LAB — URINALYSIS, ROUTINE W REFLEX MICROSCOPIC
Bilirubin Urine: NEGATIVE
Glucose, UA: 50 mg/dL — AB
Hgb urine dipstick: NEGATIVE
Ketones, ur: NEGATIVE mg/dL
Nitrite: NEGATIVE
Protein, ur: NEGATIVE mg/dL
Specific Gravity, Urine: 1.011 (ref 1.005–1.030)
pH: 7 (ref 5.0–8.0)

## 2017-10-14 LAB — COMPREHENSIVE METABOLIC PANEL
ALT: 15 U/L (ref 14–54)
AST: 17 U/L (ref 15–41)
Albumin: 3.2 g/dL — ABNORMAL LOW (ref 3.5–5.0)
Alkaline Phosphatase: 33 U/L — ABNORMAL LOW (ref 38–126)
Anion gap: 10 (ref 5–15)
BUN: 7 mg/dL (ref 6–20)
CO2: 21 mmol/L — ABNORMAL LOW (ref 22–32)
Calcium: 8.6 mg/dL — ABNORMAL LOW (ref 8.9–10.3)
Chloride: 108 mmol/L (ref 101–111)
Creatinine, Ser: 0.53 mg/dL (ref 0.44–1.00)
GFR calc Af Amer: >60 mL/min (ref >60)
GFR calc non Af Amer: >60 mL/min (ref >60)
Glucose, Bld: 128 mg/dL — ABNORMAL HIGH (ref 65–99)
Potassium: 3.7 mmol/L (ref 3.5–5.1)
Sodium: 139 mmol/L (ref 135–145)
Total Bilirubin: 0.5 mg/dL (ref 0.3–1.2)
Total Protein: 6.6 g/dL (ref 6.5–8.1)

## 2017-10-14 LAB — CBC
HCT: 32.3 % — ABNORMAL LOW (ref 36.0–46.0)
Hemoglobin: 10.5 g/dL — ABNORMAL LOW (ref 12.0–15.0)
MCH: 25.5 pg — ABNORMAL LOW (ref 26.0–34.0)
MCHC: 32.5 g/dL (ref 30.0–36.0)
MCV: 78.4 fL (ref 78.0–100.0)
Platelets: 234 10*3/uL (ref 150–400)
RBC: 4.12 MIL/uL (ref 3.87–5.11)
RDW: 14.5 % (ref 11.5–15.5)
WBC: 13.5 10*3/uL — ABNORMAL HIGH (ref 4.0–10.5)

## 2017-10-14 LAB — LIPASE, BLOOD: Lipase: 26 U/L (ref 11–51)

## 2017-10-14 LAB — AMYLASE: Amylase: 112 U/L — ABNORMAL HIGH (ref 28–100)

## 2017-10-14 MED ORDER — GI COCKTAIL ~~LOC~~
30.0000 mL | Freq: Once | ORAL | Status: AC
  Administered 2017-10-14: 30 mL via ORAL
  Filled 2017-10-14: qty 30

## 2017-10-14 MED ORDER — HYDROXYZINE HCL 50 MG/ML IM SOLN
50.0000 mg | Freq: Once | INTRAMUSCULAR | Status: AC
  Administered 2017-10-14: 50 mg via INTRAMUSCULAR
  Filled 2017-10-14: qty 1

## 2017-10-14 MED ORDER — BUTORPHANOL TARTRATE 1 MG/ML IJ SOLN
2.0000 mg | Freq: Once | INTRAMUSCULAR | Status: AC
  Administered 2017-10-14: 2 mg via INTRAMUSCULAR
  Filled 2017-10-14: qty 2

## 2017-10-14 MED ORDER — HYDROCODONE-ACETAMINOPHEN 5-325 MG PO TABS: 1.0000 | ORAL_TABLET | Freq: Four times a day (QID) | ORAL | 0 refills | Status: DC | PRN

## 2017-10-14 NOTE — MAU Provider Note (Signed)
History     Chief Complaint  Patient presents with  . Abdominal Pain   31 yo G3P1011 BF @ [redacted] week gestation presents with c/o epigastric pain radiating to her right side. No back pain (+) nausea no vomiting. Had episode before and resolved with ginger ale. Pain started yesterday. Had last eaten  [place] around 7 pm  Past Medical History:  Diagnosis Date  . Abnormal Pap smear   . GERD (gastroesophageal reflux disease)   . Gestational HTN   . Headache(784.0)   . History of ectopic pregnancy   . History of gestational diabetes   . Hypertension   . Low hemoglobin    on ironspan  . Rhinitis     Past Surgical History:  Procedure Laterality Date  . CESAREAN SECTION N/A 05/20/2013   Procedure: CESAREAN SECTION;  Surgeon: Genia DelMarie-Lyne Lavoie, MD;  Location: [facility];  Service: Obstetrics;  Laterality: N/A;  primary  . DILATION AND CURETTAGE OF UTERUS  2009  . SALPINGOOPHORECTOMY  2009   left  . WISDOM TOOTH EXTRACTION      Family History  Problem Relation Age of Onset  . Diabetes Father   . Hypertension Father   . Cancer Maternal Grandfather        prostate  . Heart disease Paternal Grandfather   . Migraines Mother   . Multiple sclerosis Sister     Social History   Tobacco Use  . Smoking status: Never Smoker  . Smokeless tobacco: Never Used  Substance Use Topics  . Alcohol use: Yes    Alcohol/week: 0.0 oz    Comment: occasionally  . Drug use: No    Allergies: No Known Allergies  Medications Prior to Admission  Medication Sig Dispense Refill Last Dose  . butalbital-acetaminophen-caffeine (FIORICET, ESGIC) 50-325-40 MG tablet Take 1 tablet by mouth 2 (two) times daily as needed for headache.   Taking  . cyproheptadine (PERIACTIN) 4 MG tablet May take 1-2 tablets up to 3x a day. Take at the onset of headache. 90 tablet 6   . doxylamine, Sleep, (UNISOM) 25 MG tablet Take 25 mg by mouth at bedtime as needed.   Taking  . EPINEPHrine (EPIPEN 2-PAK) 0.3 mg/0.3 mL IJ SOAJ  injection Inject 0.3 mLs (0.3 mg total) into the muscle once. 1 Device 0 Taking  . Magnesium Citrate 100 MG TABS Take 1 tablet by mouth daily as needed.   Taking  . Prenat-Fe Carbonyl-FA-Omega 3 (ONE-A-DAY WOMENS PRENATAL 1 PO) Take 1 tablet by mouth daily.   Taking  . ranitidine (ZANTAC) 150 MG capsule Take 150 mg by mouth 2 (two) times daily.   Taking     Physical Exam   Blood pressure (!) 144/85, pulse (!) 101, temperature 98 F (36.7 C), temperature source Oral, resp. rate 18, height 5\' 3"  (1.6 m), weight 98.4 kg (217 lb), last menstrual period 03/19/2017, SpO2 100 %.  General appearance: alert, cooperative and mild distress Abdomen: gravid. tender epigastrium Pelvic: external genitalia normal and gravid  cervix long/closed Extremities: extremities normal, atraumatic, no cyanosis or edema ED Course  IMP: GERD IUP@ 22 week P) GI cocktail MDM  Pt had cocktail and vomited once . Felt better and was sent home. F/u Dr Ernestina PennaFogleman Serita KyleSheronette A Hulbert Branscome, MD 12:34 AM 10/14/2017

## 2017-10-14 NOTE — Discharge Instructions (Signed)
Cholelithiasis Cholelithiasis is also called "gallstones." It is a kind of gallbladder disease. The gallbladder is an organ that stores a liquid (bile) that helps you digest fat. Gallstones may not cause symptoms (may be silent gallstones) until they cause a blockage, and then they can cause pain (gallbladder attack). Follow these instructions at home:  Take over-the-counter and prescription medicines only as told by your doctor.  Stay at a healthy weight.  Eat healthy foods. This includes: ? Eating fewer fatty foods, like fried foods. ? Eating fewer refined carbs (refined carbohydrates). Refined carbs are breads and grains that are highly processed, like white bread and white rice. Instead, choose whole grains like whole-wheat bread and brown rice. ? Eating more fiber. Almonds, fresh fruit, and beans are healthy sources of fiber.  Keep all follow-up visits as told by your doctor. This is important. Contact a doctor if:  You have sudden pain in the upper right side of your belly (abdomen). Pain might spread to your right shoulder or your chest. This may be a sign of a gallbladder attack.  You feel sick to your stomach (are nauseous).  You throw up (vomit).  You have been diagnosed with gallstones that have no symptoms and you get: ? Belly pain. ? Discomfort, burning, or fullness in the upper part of your belly (indigestion). Get help right away if:  You have sudden pain in the upper right side of your belly, and it lasts for more than 2 hours.  You have belly pain that lasts for more than 5 hours.  You have a fever or chills.  You keep feeling sick to your stomach or you keep throwing up.  Your skin or the whites of your eyes turn yellow (jaundice).  You have dark-colored pee (urine).  You have light-colored poop (stool). Summary  Cholelithiasis is also called "gallstones."  The gallbladder is an organ that stores a liquid (bile) that helps you digest fat.  Silent  gallstones are gallstones that do not cause symptoms.  A gallbladder attack may cause sudden pain in the upper right side of your belly. Pain might spread to your right shoulder or your chest. If this happens, contact your doctor.  If you have sudden pain in the upper right side of your belly that lasts for more than 2 hours, get help right away. This information is not intended to replace advice given to you by your health care provider. Make sure you discuss any questions you have with your health care provider. Document Released: 03/24/2008 Document Revised: 06/22/2016 Document Reviewed: 06/22/2016 Elsevier Interactive Patient Education  2017 [employer]vier Inc[employer]  

## 2017-10-14 NOTE — MAU Note (Signed)
Was here around midnight, states upper abdominal pain is worse then before. Pain is constant. No vaginal bleeding or discharge, no LOF. +FM

## 2017-10-14 NOTE — MAU Note (Signed)
Urine in lab 

## 2017-10-14 NOTE — MAU Provider Note (Signed)
History     Chief Complaint  Patient presents with  . Abdominal Pain   31 yo G3P1011 MBF @ 22 1/[redacted] weeks gestation  Returns for re-evaluation due to worsening epigastric/RUQ pain. (+) FM. Pt was seen previously and given GI cocktail. Hx GERD  OB History    Gravida Para Term Preterm AB Living   3 1 1   1 1    SAB TAB Ectopic Multiple Live Births       1   1      Past Medical History:  Diagnosis Date  . Abnormal Pap smear   . GERD (gastroesophageal reflux disease)   . Gestational HTN   . Headache(784.0)   . History of ectopic pregnancy   . History of gestational diabetes   . Hypertension   . Low hemoglobin    on ironspan  . Rhinitis     Past Surgical History:  Procedure Laterality Date  . CESAREAN SECTION N/A 05/20/2013   Procedure: CESAREAN SECTION;  Surgeon: Genia DelMarie-Lyne Lavoie, MD;  Location: [facility];  Service: Obstetrics;  Laterality: N/A;  primary  . DILATION AND CURETTAGE OF UTERUS  2009  . SALPINGOOPHORECTOMY  2009   left  . WISDOM TOOTH EXTRACTION      Family History  Problem Relation Age of Onset  . Diabetes Father   . Hypertension Father   . Cancer Maternal Grandfather        prostate  . Heart disease Paternal Grandfather   . Migraines Mother   . Multiple sclerosis Sister     Social History   Tobacco Use  . Smoking status: Never Smoker  . Smokeless tobacco: Never Used  Substance Use Topics  . Alcohol use: No    Alcohol/week: 0.0 oz    Frequency: Never  . Drug use: No    Allergies: No Known Allergies  Medications Prior to Admission  Medication Sig Dispense Refill Last Dose  . butalbital-acetaminophen-caffeine (FIORICET, ESGIC) 50-325-40 MG tablet Take 1 tablet by mouth 2 (two) times daily as needed for headache.   Taking  . cyproheptadine (PERIACTIN) 4 MG tablet May take 1-2 tablets up to 3x a day. Take at the onset of headache. 90 tablet 6 10/14/2017 at Unknown time  . doxylamine, Sleep, (UNISOM) 25 MG tablet Take 25 mg by mouth at bedtime as  needed.   10/13/2017 at Unknown time  . EPINEPHrine (EPIPEN 2-PAK) 0.3 mg/0.3 mL IJ SOAJ injection Inject 0.3 mLs (0.3 mg total) into the muscle once. 1 Device 0 More than a month at Unknown time  . Magnesium Citrate 100 MG TABS Take 1 tablet by mouth daily as needed.   Past Month at Unknown time  . Prenat-Fe Carbonyl-FA-Omega 3 (ONE-A-DAY WOMENS PRENATAL 1 PO) Take 1 tablet by mouth daily.   10/14/2017 at Unknown time  . ranitidine (ZANTAC) 150 MG capsule Take 150 mg by mouth 2 (two) times daily.   10/14/2017 at Unknown time     Physical Exam   Blood pressure 139/86, pulse 94, temperature 97.9 F (36.6 C), temperature source Oral, resp. rate 20, height 5\' 3"  (1.6 m), weight 96 kg (211 lb 12 oz), last menstrual period 03/19/2017.  General appearance: alert, cooperative and mild distress Abdomen: gravid. tender epigastrium/ruq   IMP: RUQ pain ? gallstones IUP@ 22 1/7 weeks P) cbc. Cmet, amylase, lipase. Gallbladder sonogram ED Course  Addendum: CBC Latest Ref Rng & Units 10/14/2017 05/21/2013 05/20/2013  WBC 4.0 - 10.5 K/uL 13.5(H) 10.7(H) 12.1(H)  Hemoglobin 12.0 - 15.0  g/dL 10.5(L) 9.5(L) 10.6(L)  Hematocrit 36.0 - 46.0 % 32.3(L) 28.3(L) 31.7(L)  Platelets 150 - 400 K/uL 234 182 197   CMP Latest Ref Rng & Units 10/14/2017 05/20/2013 05/14/2013  Glucose 65 - 99 mg/dL 161(W128(H) 85 95  BUN 6 - 20 mg/dL 7 10 10   Creatinine 0.44 - 1.00 mg/dL 9.600.53 4.540.70 0.980.65  Sodium 135 - 145 mmol/L 139 132(L) 134(L)  Potassium 3.5 - 5.1 mmol/L 3.7 3.6 3.6  Chloride 101 - 111 mmol/L 108 99 98  CO2 22 - 32 mmol/L 21(L) 20 23  Calcium 8.9 - 10.3 mg/dL 1.1(B8.6(L) 9.8 9.7  Total Protein 6.5 - 8.1 g/dL 6.6 5.9(L) 6.1  Total Bilirubin 0.3 - 1.2 mg/dL 0.5 0.4 0.5  Alkaline Phos 38 - 126 U/L 33(L) 57 53  AST 15 - 41 U/L 17 36 28  ALT 14 - 54 U/L 15 26 27   amylase nl Lipase nl [place] Abdomen Limited  Result Date: 10/14/2017 CLINICAL DATA:  Epigastric pain radiating to the right with nausea. Second trimester pregnancy.  EXAM: ULTRASOUND ABDOMEN LIMITED RIGHT UPPER QUADRANT COMPARISON:  None. FINDINGS: Gallbladder: Multiple gallstones with neighboring sludge. No wall thickening or focal tenderness. Common bile duct: Measures 3 mm in diameter on hepatic hilum imaging. Tubular structure more posterior to the duct does not have color Doppler flow centrally due to perpendicular plane - more proximally this structure does have vascular flow. Noted negative bilirubin today. Liver: No focal lesion identified. Within normal limits in parenchymal echogenicity. Portal vein is patent on color Doppler imaging with normal direction of blood flow towards the liver. IMPRESSION: Cholelithiasis and sludge. No evidence of cholecystitis. Electronically Signed   By: Marnee SpringJonathon  Watts M.D.   On: 10/14/2017 08:57  IMP: Gallstones w/o acute cholecystitis prob aggravated by holiday meal P) IM stadol/vistaril. D/c home with pain med. F/u with Dr Ernestina PennaFogleman. GI referral through office. Diet changes in the interim MDM  Serita KyleSheronette A Tayt Moyers, MD 9:10 AM 10/14/2017

## 2017-10-22 DIAGNOSIS — H1045 Other chronic allergic conjunctivitis: Secondary | ICD-10-CM | POA: Diagnosis not present

## 2017-10-22 DIAGNOSIS — L509 Urticaria, unspecified: Secondary | ICD-10-CM | POA: Diagnosis not present

## 2017-10-22 DIAGNOSIS — R05 Cough: Secondary | ICD-10-CM | POA: Diagnosis not present

## 2017-10-29 DIAGNOSIS — K219 Gastro-esophageal reflux disease without esophagitis: Secondary | ICD-10-CM | POA: Diagnosis not present

## 2017-10-29 DIAGNOSIS — K8020 Calculus of gallbladder without cholecystitis without obstruction: Secondary | ICD-10-CM | POA: Diagnosis not present

## 2017-10-29 DIAGNOSIS — K5901 Slow transit constipation: Secondary | ICD-10-CM | POA: Diagnosis not present

## 2017-11-27 DIAGNOSIS — R42 Dizziness and giddiness: Secondary | ICD-10-CM | POA: Diagnosis not present

## 2017-12-03 DIAGNOSIS — O99810 Abnormal glucose complicating pregnancy: Secondary | ICD-10-CM | POA: Diagnosis not present

## 2017-12-03 DIAGNOSIS — Z3A29 29 weeks gestation of pregnancy: Secondary | ICD-10-CM | POA: Diagnosis not present

## 2017-12-15 ENCOUNTER — Encounter: Payer: Self-pay | Admitting: Neurology

## 2017-12-15 ENCOUNTER — Ambulatory Visit: Payer: 59 | Admitting: Neurology

## 2017-12-15 VITALS — BP 120/72 | HR 98 | Ht 63.0 in | Wt 211.0 lb

## 2017-12-15 DIAGNOSIS — G43009 Migraine without aura, not intractable, without status migrainosus: Secondary | ICD-10-CM | POA: Diagnosis not present

## 2017-12-15 DIAGNOSIS — R519 Headache, unspecified: Secondary | ICD-10-CM

## 2017-12-15 DIAGNOSIS — R51 Headache: Secondary | ICD-10-CM | POA: Diagnosis not present

## 2017-12-15 DIAGNOSIS — O26892 Other specified pregnancy related conditions, second trimester: Secondary | ICD-10-CM | POA: Diagnosis not present

## 2017-12-15 NOTE — Progress Notes (Signed)
[facility]    Provider:  Dr Lucia Gaskins Referring Provider: Kerman Passey, MD Primary Care Physician:  Kerman Passey, MD  CC:  Headaches  Interval history 12/15/2017:  Her migraines have improved. She is in her second trimester. More related to sleep and fatigue due to pregnancy. She had a headache the other day. Discussed migraines can improve in pregnancy. The Periactin helps. Taking it when she has headaches helps if she takes it in the start of headache. Helps. Discussed migraines and menopause. Mother had migraines, improved after menopause.    Reviewed images of MRI brain with patient and answered questions, was normal.  HPI:  Sheri Malone is a 32 y.o. female here as a referral from Dr. Sherie Don for headaches. Patient is approx [redacted] weeks pregnant.  She has had right arm and neck pain, had normal c-spine xrays, She has a history of migraines starting in adolescence. Birth control helped. She has used Fioricet in the past. Headaches are worsening. A few months ago she had a shoulder injury and PT has helped. These headaches are different, not eating triggers it, not sleeping well triggers headaches. Mother with migraines. It can start anywhere in the head, behind the eyes, can be unilateral, she has light/sound sensitivity, being in the dark helps, positional laying down makes it worse, movement makes it worse, sitting straight in a dark place is the best. Worsening, weekly. Could last 2 days. She has headaches 20 days a month and over half are migrainous. She has associated nausea. She has vision changes with the migraines. No hearing changes, no pulsating in the ears.   Reviewed notes, labs and imaging from outside physicians, which showed:  Personally reviewed images and agree with the following:  There is no evidence of cervical spine fracture or prevertebral soft tissue swelling. Alignment is normal. No other significant bone abnormalities are  identified.  IMPRESSION: Negative cervical spine radiographs.  CBC with anemia  Review of Systems: Patient complains of symptoms per HPI as well as the following symptoms: headache, eye discharge, eye redness, dizziness, headache, joint pain, back pain, aching muscles, muscle cramps, snoring, frequent waking, daytime sleepiness, cough, wheezing, leg swelling. Pertinent negatives and positives per HPI. All others negative.   Social History   Socioeconomic History  . Marital status: Married    Spouse name: Not on file  . Number of children: Not on file  . Years of education: Not on file  . Highest education level: Not on file  Social Needs  . Financial resource strain: Not on file  . Food insecurity - worry: Not on file  . Food insecurity - inability: Not on file  . Transportation needs - medical: Not on file  . Transportation needs - non-medical: Not on file  Occupational History  . Not on file  Tobacco Use  . Smoking status: Never Smoker  . Smokeless tobacco: Never Used  Substance and Sexual Activity  . Alcohol use: No    Alcohol/week: 0.0 oz    Frequency: Never  . Drug use: No  . Sexual activity: Yes    Partners: Male  Other Topics Concern  . Not on file  Social History Narrative   Lives at home with husband and daughter. Pregnant with baby boy due 02/16/18.   Right handed   Drinks 2 cups of caffeine daily    Family History  Problem Relation Age of Onset  . Diabetes Father   . Hypertension Father   . Cancer Maternal Grandfather  prostate  . Heart disease Paternal Grandfather   . Migraines Mother   . Multiple sclerosis Sister     Past Medical History:  Diagnosis Date  . Abnormal Pap smear   . GERD (gastroesophageal reflux disease)   . Gestational HTN   . Headache(784.0)   . History of ectopic pregnancy   . History of gestational diabetes   . Hypertension   . Low hemoglobin    on ironspan  . Rhinitis     Past Surgical History:  Procedure  Laterality Date  . CESAREAN SECTION N/A 05/20/2013   Procedure: CESAREAN SECTION;  Surgeon: Genia Del, MD;  Location: [facility];  Service: Obstetrics;  Laterality: N/A;  primary  . DILATION AND CURETTAGE OF UTERUS  2009  . SALPINGOOPHORECTOMY  2009   left  . WISDOM TOOTH EXTRACTION      Current Outpatient Medications  Medication Sig Dispense Refill  . cyproheptadine (PERIACTIN) 4 MG tablet May take 1-2 tablets up to 3x a day. Take at the onset of headache. 90 tablet 6  . doxylamine, Sleep, (UNISOM) 25 MG tablet Take 25 mg by mouth at bedtime as needed.    . Fe Cbn-Fe Gluc-FA-B12-C-DSS (FERRALET 90) 90-1 MG TABS Take 1 tablet by mouth daily.    . Magnesium Citrate 100 MG TABS Take 1 tablet by mouth daily as needed.    Marland Kitchen MONTELUKAST SODIUM PO Take by mouth.    . pantoprazole (PROTONIX) 40 MG tablet Take 40 mg by mouth daily.    . Prenat-Fe Carbonyl-FA-Omega 3 (ONE-A-DAY WOMENS PRENATAL 1 PO) Take 1 tablet by mouth daily.    Marland Kitchen EPINEPHrine (EPIPEN 2-PAK) 0.3 mg/0.3 mL IJ SOAJ injection Inject 0.3 mLs (0.3 mg total) into the muscle once. 1 Device 0   No current facility-administered medications for this visit.     Allergies as of 12/15/2017 - Review Complete 12/15/2017  Allergen Reaction Noted  . 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 Other Anaphylaxis 12/15/2017    Vitals: BP 120/72 (BP Location: Right Arm, Patient Position: Sitting)   Pulse 98   Ht 5\' 3"  (1.6 m)   Wt 211 lb (95.7 kg)   LMP 03/19/2017 (Exact Date)   BMI 37.38 kg/m  Last Weight:  Wt Readings from Last 1 Encounters:  12/15/17 211 lb (95.7 kg)   Last Height:   Ht Readings from Last 1 Encounters:  12/15/17 5\' 3"  (1.6 m)   Physical exam: Exam: Gen: NAD, conversant, well nourised, obese, well groomed                     CV: RRR, no MRG. No Carotid Bruits. No peripheral edema, warm, nontender Eyes: Conjunctivae clear without exudates or hemorrhage  Neuro: Detailed  Neurologic Exam  Speech:    Speech is normal; fluent and spontaneous with normal comprehension.  Cognition:    The patient is oriented to person, place, and time;     recent and remote memory intact;     language fluent;     normal attention, concentration,     fund of knowledge Cranial Nerves:    The pupils are equal, round, and reactive to light. The fundi are normal and spontaneous venous pulsations are present. Visual fields are full to finger confrontation. Extraocular movements are intact. Trigeminal sensation is intact and the muscles of mastication are normal. The face is symmetric. The palate elevates in the midline. Hearing intact. Voice is normal. Shoulder shrug is normal. The  tongue has normal motion without fasciculations.   Coordination:    Normal finger to nose and heel to shin. Normal rapid alternating movements.   Gait:    Heel-toe and tandem gait are normal.   Motor Observation:    No asymmetry, no atrophy, and no involuntary movements noted. Tone:    Normal muscle tone.    Posture:    Posture is normal. normal erect    Strength:    Strength is V/V in the upper and lower limbs.      Sensation: intact to LT     Reflex Exam:  DTR's:    Deep tendon reflexes in the upper and lower extremities are normal bilaterally.   Toes:    The toes are downgoing bilaterally.   Clonus:    Clonus is absent.       Assessment/Plan:  Patient with chronic migraines without aura, worsened in pregnancy. Given the vision changes and positional quality (worse laying down) which is unusual in migraines, need MRI of the brain to evaluate for space occupying lesions, pituitary hyperplasia, other lesions or compressive masses.   MRI brain normal, reviewed with patient Continue current medications  There are limited treatments in pregnancy and all have risks.  Tylenol is generally accepted as safe in pregnancy. Periactin (Cyproheptadine) is class B which is likely compatible  without any data for teratogenicity however no medication is entriely safe.  Discussed at length with patient. Do not take more than needed.   Other interventions to try and decreased the use of medication for headache/migraine include:  Cool Compress. Lie down and place a cool compress on your head.  Avoid headache triggers. If certain foods or odors seem to have triggered your migraines in the past, avoid them. A headache diary might help you identify triggers.  Include physical activity in your daily routine. Try a daily walk or other moderate aerobic exercise.  Manage stress. Find healthy ways to cope with the stressors, such as delegating tasks on your to-do list.  Practice relaxation techniques. Try deep breathing, yoga, massage and visualization.  Eat regularly. Eating regularly scheduled meals and maintaining a healthy diet might help prevent headaches. Also, drink plenty of fluids.  Follow a regular sleep schedule. Sleep deprivation might contribute to headaches  Consider biofeedback. With this mind-body technique, you learn to control certain bodily functions - such as muscle tension, heart rate and blood pressure - to prevent headaches or reduce headache pain.  Headaches during pregnancy are common. However, if you develop a severe headache or a headache that doesn't go away, call your health care provider. Severe headaches can be a sign of a pregnancy complication    Naomie DeanAntonia Doyce Saling, MD  [facility] [address] [place], [place] [id number]  Phone [phone] Fax [phone]  Cc: Kerman PasseyLada, Melinda P, MD, Dr. Ernestina PennaFogleman  A total of 25 minutes was spent in with this patient face to face. Over half this time was spent on counseling patient on the migraines, headache in pregnancy diagnosis and different therapeutic options available.

## 2017-12-31 DIAGNOSIS — Z3A33 33 weeks gestation of pregnancy: Secondary | ICD-10-CM | POA: Diagnosis not present

## 2017-12-31 DIAGNOSIS — O26619 Liver and biliary tract disorders in pregnancy, unspecified trimester: Secondary | ICD-10-CM | POA: Diagnosis not present

## 2018-01-08 DIAGNOSIS — O26613 Liver and biliary tract disorders in pregnancy, third trimester: Secondary | ICD-10-CM | POA: Diagnosis not present

## 2018-01-08 DIAGNOSIS — Z3A34 34 weeks gestation of pregnancy: Secondary | ICD-10-CM | POA: Diagnosis not present

## 2018-01-18 DIAGNOSIS — Z3400 Encounter for supervision of normal first pregnancy, unspecified trimester: Secondary | ICD-10-CM | POA: Diagnosis not present

## 2018-01-21 ENCOUNTER — Other Ambulatory Visit: Payer: Self-pay | Admitting: Obstetrics

## 2018-01-21 LAB — OB RESULTS CONSOLE GBS: GBS: POSITIVE

## 2018-01-28 ENCOUNTER — Encounter (HOSPITAL_COMMUNITY): Payer: Self-pay

## 2018-01-29 ENCOUNTER — Encounter (HOSPITAL_COMMUNITY): Payer: Self-pay

## 2018-02-04 ENCOUNTER — Encounter (HOSPITAL_COMMUNITY): Payer: Self-pay | Admitting: *Deleted

## 2018-02-04 ENCOUNTER — Inpatient Hospital Stay (HOSPITAL_COMMUNITY): Payer: 59 | Admitting: Anesthesiology

## 2018-02-04 ENCOUNTER — Encounter (HOSPITAL_COMMUNITY)
Admission: AD | Disposition: A | Discharge status: Home / Self Care | Payer: Self-pay | Source: Ambulatory Visit | Attending: Obstetrics

## 2018-02-04 ENCOUNTER — Inpatient Hospital Stay (HOSPITAL_COMMUNITY)
Admission: AD | Admit: 2018-02-04 | Discharge: 2018-02-06 | DRG: 784 | Disposition: A | Discharge status: Home / Self Care | Payer: 59 | Source: Ambulatory Visit | Attending: Obstetrics | Admitting: Obstetrics

## 2018-02-04 DIAGNOSIS — O9962 Diseases of the digestive system complicating childbirth: Secondary | ICD-10-CM | POA: Diagnosis present

## 2018-02-04 DIAGNOSIS — O9081 Anemia of the puerperium: Secondary | ICD-10-CM | POA: Diagnosis not present

## 2018-02-04 DIAGNOSIS — Z3A38 38 weeks gestation of pregnancy: Secondary | ICD-10-CM | POA: Diagnosis not present

## 2018-02-04 DIAGNOSIS — D62 Acute posthemorrhagic anemia: Secondary | ICD-10-CM | POA: Diagnosis present

## 2018-02-04 DIAGNOSIS — K219 Gastro-esophageal reflux disease without esophagitis: Secondary | ICD-10-CM | POA: Diagnosis present

## 2018-02-04 DIAGNOSIS — O34219 Maternal care for unspecified type scar from previous cesarean delivery: Secondary | ICD-10-CM | POA: Diagnosis not present

## 2018-02-04 DIAGNOSIS — Z302 Encounter for sterilization: Secondary | ICD-10-CM

## 2018-02-04 DIAGNOSIS — Z98891 History of uterine scar from previous surgery: Secondary | ICD-10-CM

## 2018-02-04 DIAGNOSIS — O34211 Maternal care for low transverse scar from previous cesarean delivery: Principal | ICD-10-CM | POA: Diagnosis present

## 2018-02-04 HISTORY — PX: UNILATERAL SALPINGECTOMY: SHX6160

## 2018-02-04 LAB — CBC
HCT: 37.3 % (ref 36.0–46.0)
Hemoglobin: 12.2 g/dL (ref 12.0–15.0)
MCH: 25.3 pg — ABNORMAL LOW (ref 26.0–34.0)
MCHC: 32.7 g/dL (ref 30.0–36.0)
MCV: 77.4 fL — ABNORMAL LOW (ref 78.0–100.0)
Platelets: 249 10*3/uL (ref 150–400)
RBC: 4.82 MIL/uL (ref 3.87–5.11)
RDW: 15.7 % — ABNORMAL HIGH (ref 11.5–15.5)
WBC: 9.2 10*3/uL (ref 4.0–10.5)

## 2018-02-04 LAB — RPR: RPR Ser Ql: NONREACTIVE

## 2018-02-04 LAB — TYPE AND SCREEN
ABO/RH(D): O POS
Antibody Screen: NEGATIVE

## 2018-02-04 SURGERY — Surgical Case
Anesthesia: Spinal | Site: Abdomen | Laterality: Right | Wound class: Clean Contaminated

## 2018-02-04 MED ORDER — SODIUM CHLORIDE 0.9% FLUSH: 3.0000 mL | INTRAVENOUS | Status: DC | PRN

## 2018-02-04 MED ORDER — FENTANYL CITRATE (PF) 100 MCG/2ML IJ SOLN: 25.0000 ug | INTRAMUSCULAR | Status: DC | PRN

## 2018-02-04 MED ORDER — SOD CITRATE-CITRIC ACID 500-334 MG/5ML PO SOLN
ORAL | Status: AC
  Administered 2018-02-04: 30 mL via ORAL
  Filled 2018-02-04: qty 15

## 2018-02-04 MED ORDER — METOCLOPRAMIDE HCL 5 MG/ML IJ SOLN: 10.0000 mg | Freq: Once | INTRAMUSCULAR | Status: DC | PRN

## 2018-02-04 MED ORDER — OXYTOCIN 10 UNIT/ML IJ SOLN
INTRAMUSCULAR | Status: AC
  Filled 2018-02-04: qty 4

## 2018-02-04 MED ORDER — PHENYLEPHRINE 8 MG IN D5W 100 ML (0.08MG/ML) PREMIX OPTIME
INJECTION | INTRAVENOUS | Status: DC | PRN
  Administered 2018-02-04: 40 ug/min via INTRAVENOUS

## 2018-02-04 MED ORDER — DIPHENHYDRAMINE HCL 25 MG PO CAPS
25.0000 mg | ORAL_CAPSULE | ORAL | Status: DC | PRN
  Administered 2018-02-04: 25 mg via ORAL

## 2018-02-04 MED ORDER — PANTOPRAZOLE SODIUM 40 MG PO TBEC
40.0000 mg | DELAYED_RELEASE_TABLET | Freq: Every day | ORAL | Status: DC
  Administered 2018-02-05 – 2018-02-06 (×2): 40 mg via ORAL
  Filled 2018-02-04 (×2): qty 1

## 2018-02-04 MED ORDER — NALBUPHINE HCL 10 MG/ML IJ SOLN
5.0000 mg | INTRAMUSCULAR | Status: DC | PRN
  Administered 2018-02-04 – 2018-02-05 (×2): 5 mg via INTRAVENOUS
  Filled 2018-02-04 (×2): qty 1

## 2018-02-04 MED ORDER — LACTATED RINGERS IV SOLN
INTRAVENOUS | Status: DC
  Administered 2018-02-04: 12:00:00 via INTRAVENOUS

## 2018-02-04 MED ORDER — SODIUM CHLORIDE 0.9 % IR SOLN
Status: DC | PRN
  Administered 2018-02-04: 1

## 2018-02-04 MED ORDER — ACETAMINOPHEN 500 MG PO TABS
1000.0000 mg | ORAL_TABLET | Freq: Four times a day (QID) | ORAL | Status: DC
  Administered 2018-02-05 (×2): 1000 mg via ORAL
  Filled 2018-02-04 (×2): qty 2

## 2018-02-04 MED ORDER — LACTATED RINGERS IV SOLN: INTRAVENOUS | Status: DC

## 2018-02-04 MED ORDER — COCONUT OIL OIL: 1.0000 "application " | TOPICAL_OIL | Status: DC | PRN

## 2018-02-04 MED ORDER — ZOLPIDEM TARTRATE 5 MG PO TABS: 5.0000 mg | ORAL_TABLET | Freq: Every evening | ORAL | Status: DC | PRN

## 2018-02-04 MED ORDER — NALOXONE HCL 0.4 MG/ML IJ SOLN: 0.4000 mg | INTRAMUSCULAR | Status: DC | PRN

## 2018-02-04 MED ORDER — KETOROLAC TROMETHAMINE 30 MG/ML IJ SOLN
INTRAMUSCULAR | Status: AC
  Administered 2018-02-04: 30 mg via INTRAVENOUS
  Filled 2018-02-04: qty 1

## 2018-02-04 MED ORDER — DIPHENHYDRAMINE HCL 25 MG PO CAPS
25.0000 mg | ORAL_CAPSULE | Freq: Four times a day (QID) | ORAL | Status: DC | PRN
  Filled 2018-02-04: qty 1

## 2018-02-04 MED ORDER — OXYTOCIN 10 UNIT/ML IJ SOLN
INTRAVENOUS | Status: DC | PRN
  Administered 2018-02-04: 40 [IU] via INTRAVENOUS

## 2018-02-04 MED ORDER — DIPHENHYDRAMINE HCL 50 MG/ML IJ SOLN
INTRAMUSCULAR | Status: AC
  Filled 2018-02-04: qty 1

## 2018-02-04 MED ORDER — KETOROLAC TROMETHAMINE 30 MG/ML IJ SOLN
30.0000 mg | Freq: Four times a day (QID) | INTRAMUSCULAR | Status: DC | PRN
Start: 2018-02-04 — End: 2018-02-05
  Administered 2018-02-04: 30 mg via INTRAMUSCULAR

## 2018-02-04 MED ORDER — DIPHENHYDRAMINE HCL 50 MG/ML IJ SOLN: 12.5000 mg | INTRAMUSCULAR | Status: DC | PRN

## 2018-02-04 MED ORDER — FAMOTIDINE IN NACL 20-0.9 MG/50ML-% IV SOLN
20.0000 mg | Freq: Once | INTRAVENOUS | Status: AC
  Administered 2018-02-04: 20 mg via INTRAVENOUS

## 2018-02-04 MED ORDER — FAMOTIDINE IN NACL 20-0.9 MG/50ML-% IV SOLN
INTRAVENOUS | Status: AC
  Filled 2018-02-04: qty 50

## 2018-02-04 MED ORDER — ACETAMINOPHEN 325 MG PO TABS
650.0000 mg | ORAL_TABLET | ORAL | Status: DC | PRN
Start: 2018-02-04 — End: 2018-02-06
  Administered 2018-02-05 – 2018-02-06 (×4): 650 mg via ORAL
  Filled 2018-02-04 (×4): qty 2

## 2018-02-04 MED ORDER — KETOROLAC TROMETHAMINE 30 MG/ML IJ SOLN
30.0000 mg | Freq: Four times a day (QID) | INTRAMUSCULAR | Status: DC | PRN
  Administered 2018-02-04: 30 mg via INTRAVENOUS
  Filled 2018-02-04: qty 1

## 2018-02-04 MED ORDER — SCOPOLAMINE 1 MG/3DAYS TD PT72
MEDICATED_PATCH | TRANSDERMAL | Status: AC
  Filled 2018-02-04: qty 1

## 2018-02-04 MED ORDER — DEXAMETHASONE SODIUM PHOSPHATE 10 MG/ML IJ SOLN
INTRAMUSCULAR | Status: AC
  Filled 2018-02-04: qty 1

## 2018-02-04 MED ORDER — MEPERIDINE HCL 25 MG/ML IJ SOLN: 6.2500 mg | INTRAMUSCULAR | Status: DC | PRN

## 2018-02-04 MED ORDER — CEFAZOLIN SODIUM-DEXTROSE 2-4 GM/100ML-% IV SOLN
2.0000 g | Freq: Once | INTRAVENOUS | Status: AC
Start: 2018-02-04 — End: 2018-02-04
  Administered 2018-02-04: 2 g via INTRAVENOUS

## 2018-02-04 MED ORDER — ONDANSETRON HCL 4 MG/2ML IJ SOLN
INTRAMUSCULAR | Status: AC
  Filled 2018-02-04: qty 2

## 2018-02-04 MED ORDER — FENTANYL CITRATE (PF) 100 MCG/2ML IJ SOLN
50.0000 ug | INTRAMUSCULAR | Status: DC | PRN
  Administered 2018-02-04: 50 ug via INTRAVENOUS
  Filled 2018-02-04: qty 2

## 2018-02-04 MED ORDER — NALBUPHINE HCL 10 MG/ML IJ SOLN: 5.0000 mg | Freq: Once | INTRAMUSCULAR | Status: DC | PRN

## 2018-02-04 MED ORDER — WITCH HAZEL-GLYCERIN EX PADS: 1.0000 "application " | MEDICATED_PAD | CUTANEOUS | Status: DC | PRN

## 2018-02-04 MED ORDER — IBUPROFEN 600 MG PO TABS
600.0000 mg | ORAL_TABLET | Freq: Four times a day (QID) | ORAL | Status: DC
  Administered 2018-02-05 – 2018-02-06 (×6): 600 mg via ORAL
  Filled 2018-02-04 (×6): qty 1

## 2018-02-04 MED ORDER — TETANUS-DIPHTH-ACELL PERTUSSIS 5-2.5-18.5 LF-MCG/0.5 IM SUSP: 0.5000 mL | Freq: Once | INTRAMUSCULAR | Status: DC

## 2018-02-04 MED ORDER — SCOPOLAMINE 1 MG/3DAYS TD PT72: 1.0000 | MEDICATED_PATCH | Freq: Once | TRANSDERMAL | Status: DC

## 2018-02-04 MED ORDER — MEPERIDINE HCL 25 MG/ML IJ SOLN
INTRAMUSCULAR | Status: DC | PRN
  Administered 2018-02-04 (×2): 12.5 mg via INTRAVENOUS

## 2018-02-04 MED ORDER — FENTANYL CITRATE (PF) 100 MCG/2ML IJ SOLN
INTRAMUSCULAR | Status: AC
  Filled 2018-02-04: qty 2

## 2018-02-04 MED ORDER — SCOPOLAMINE 1 MG/3DAYS TD PT72
MEDICATED_PATCH | TRANSDERMAL | Status: DC | PRN
  Administered 2018-02-04: 1 via TRANSDERMAL

## 2018-02-04 MED ORDER — SIMETHICONE 80 MG PO CHEW
80.0000 mg | CHEWABLE_TABLET | Freq: Three times a day (TID) | ORAL | Status: DC
  Administered 2018-02-04 – 2018-02-06 (×5): 80 mg via ORAL
  Filled 2018-02-04 (×4): qty 1

## 2018-02-04 MED ORDER — DIBUCAINE 1 % RE OINT: 1.0000 "application " | TOPICAL_OINTMENT | RECTAL | Status: DC | PRN

## 2018-02-04 MED ORDER — ONDANSETRON HCL 4 MG/2ML IJ SOLN: 4.0000 mg | Freq: Three times a day (TID) | INTRAMUSCULAR | Status: DC | PRN

## 2018-02-04 MED ORDER — FENTANYL CITRATE (PF) 100 MCG/2ML IJ SOLN
INTRAMUSCULAR | Status: AC
  Administered 2018-02-04: 50 ug via INTRAVENOUS
  Filled 2018-02-04: qty 2

## 2018-02-04 MED ORDER — SIMETHICONE 80 MG PO CHEW: 80.0000 mg | CHEWABLE_TABLET | ORAL | Status: DC | PRN

## 2018-02-04 MED ORDER — DEXAMETHASONE SODIUM PHOSPHATE 10 MG/ML IJ SOLN
INTRAMUSCULAR | Status: DC | PRN
  Administered 2018-02-04: 10 mg via INTRAVENOUS

## 2018-02-04 MED ORDER — NALBUPHINE HCL 10 MG/ML IJ SOLN: 5.0000 mg | INTRAMUSCULAR | Status: DC | PRN

## 2018-02-04 MED ORDER — SENNOSIDES-DOCUSATE SODIUM 8.6-50 MG PO TABS
2.0000 | ORAL_TABLET | ORAL | Status: DC
  Administered 2018-02-05 (×2): 2 via ORAL
  Filled 2018-02-04 (×2): qty 2

## 2018-02-04 MED ORDER — OXYTOCIN 40 UNITS IN LACTATED RINGERS INFUSION - SIMPLE MED: 2.5000 [IU]/h | INTRAVENOUS | Status: DC

## 2018-02-04 MED ORDER — NALOXONE HCL 4 MG/10ML IJ SOLN
1.0000 ug/kg/h | INTRAMUSCULAR | Status: DC | PRN
  Filled 2018-02-04: qty 5

## 2018-02-04 MED ORDER — DIPHENHYDRAMINE HCL 50 MG/ML IJ SOLN
INTRAMUSCULAR | Status: DC | PRN
  Administered 2018-02-04: 25 mg via INTRAVENOUS

## 2018-02-04 MED ORDER — PRENATAL MULTIVITAMIN CH
1.0000 | ORAL_TABLET | Freq: Every day | ORAL | Status: DC
  Administered 2018-02-05: 1 via ORAL
  Filled 2018-02-04: qty 1

## 2018-02-04 MED ORDER — SIMETHICONE 80 MG PO CHEW
80.0000 mg | CHEWABLE_TABLET | ORAL | Status: DC
  Administered 2018-02-05: 80 mg via ORAL
  Filled 2018-02-04 (×2): qty 1

## 2018-02-04 MED ORDER — MENTHOL 3 MG MT LOZG: 1.0000 | LOZENGE | OROMUCOSAL | Status: DC | PRN

## 2018-02-04 MED ORDER — SODIUM CHLORIDE 0.9 % IJ SOLN
INTRAMUSCULAR | Status: AC
  Filled 2018-02-04: qty 10

## 2018-02-04 MED ORDER — OXYCODONE HCL 5 MG PO TABS
5.0000 mg | ORAL_TABLET | ORAL | Status: DC | PRN
  Administered 2018-02-04 – 2018-02-06 (×6): 5 mg via ORAL
  Filled 2018-02-04 (×6): qty 1

## 2018-02-04 MED ORDER — LACTATED RINGERS IV SOLN
INTRAVENOUS | Status: DC
  Administered 2018-02-04 (×4): via INTRAVENOUS

## 2018-02-04 MED ORDER — SOD CITRATE-CITRIC ACID 500-334 MG/5ML PO SOLN
30.0000 mL | Freq: Once | ORAL | Status: AC
  Administered 2018-02-04: 30 mL via ORAL

## 2018-02-04 MED ORDER — MORPHINE SULFATE (PF) 0.5 MG/ML IJ SOLN
INTRAMUSCULAR | Status: AC
  Filled 2018-02-04: qty 10

## 2018-02-04 MED ORDER — ONDANSETRON HCL 4 MG/2ML IJ SOLN
INTRAMUSCULAR | Status: DC | PRN
  Administered 2018-02-04: 4 mg via INTRAVENOUS

## 2018-02-04 MED ORDER — MEPERIDINE HCL 25 MG/ML IJ SOLN
INTRAMUSCULAR | Status: AC
  Filled 2018-02-04: qty 1

## 2018-02-04 SURGICAL SUPPLY — 38 items
BENZOIN TINCTURE PRP APPL 2/3 (GAUZE/BANDAGES/DRESSINGS) ×3 IMPLANT
CHLORAPREP W/TINT 26ML (MISCELLANEOUS) ×3 IMPLANT
CLAMP CORD UMBIL (MISCELLANEOUS) IMPLANT
CLOSURE STERI STRIP 1/2 X4 (GAUZE/BANDAGES/DRESSINGS) ×3 IMPLANT
CLOTH BEACON ORANGE TIMEOUT ST (SAFETY) ×3 IMPLANT
DRSG OPSITE POSTOP 4X10 (GAUZE/BANDAGES/DRESSINGS) ×3 IMPLANT
ELECT REM PT RETURN 9FT ADLT (ELECTROSURGICAL) ×3
ELECTRODE REM PT RTRN 9FT ADLT (ELECTROSURGICAL) ×2 IMPLANT
EXTRACTOR VACUUM M CUP 4 TUBE (SUCTIONS) IMPLANT
GAUZE SPONGE 4X4 12PLY STRL LF (GAUZE/BANDAGES/DRESSINGS) ×6 IMPLANT
GLOVE BIO SURGEON STRL SZ 6.5 (GLOVE) ×3 IMPLANT
GLOVE BIOGEL PI IND STRL 7.0 (GLOVE) ×4 IMPLANT
GLOVE BIOGEL PI INDICATOR 7.0 (GLOVE) ×2
GOWN STRL REUS W/TWL LRG LVL3 (GOWN DISPOSABLE) ×6 IMPLANT
HEMOSTAT ARISTA ABSORB 3G PWDR (MISCELLANEOUS) ×3 IMPLANT
KIT ABG SYR 3ML LUER SLIP (SYRINGE) IMPLANT
NEEDLE HYPO 22GX1.5 SAFETY (NEEDLE) IMPLANT
NEEDLE HYPO 25X5/8 SAFETYGLIDE (NEEDLE) IMPLANT
NS IRRIG 1000ML POUR BTL (IV SOLUTION) ×3 IMPLANT
PACK C SECTION WH (CUSTOM PROCEDURE TRAY) ×3 IMPLANT
PAD ABD 7.5X8 STRL (GAUZE/BANDAGES/DRESSINGS) ×3 IMPLANT
PAD OB MATERNITY 4.3X12.25 (PERSONAL CARE ITEMS) ×3 IMPLANT
PENCIL SMOKE EVAC W/HOLSTER (ELECTROSURGICAL) ×3 IMPLANT
SPONGE LAP 18X18 X RAY DECT (DISPOSABLE) ×3 IMPLANT
STRIP CLOSURE SKIN 1/2X4 (GAUZE/BANDAGES/DRESSINGS) IMPLANT
SUT MON AB 4-0 PS1 27 (SUTURE) ×3 IMPLANT
SUT PLAIN 0 NONE (SUTURE) IMPLANT
SUT PLAIN 2 0 (SUTURE) ×1
SUT PLAIN 2 0 XLH (SUTURE) IMPLANT
SUT PLAIN ABS 2-0 CT1 27XMFL (SUTURE) ×2 IMPLANT
SUT VIC AB 0 CT1 36 (SUTURE) ×6 IMPLANT
SUT VIC AB 0 CTX 36 (SUTURE) ×4
SUT VIC AB 0 CTX36XBRD ANBCTRL (SUTURE) ×8 IMPLANT
SUT VIC AB 2-0 CT1 27 (SUTURE) ×1
SUT VIC AB 2-0 CT1 TAPERPNT 27 (SUTURE) ×2 IMPLANT
SYR CONTROL 10ML LL (SYRINGE) IMPLANT
TOWEL OR 17X24 6PK STRL BLUE (TOWEL DISPOSABLE) ×3 IMPLANT
TRAY FOLEY W/BAG SLVR 14FR LF (SET/KITS/TRAYS/PACK) IMPLANT

## 2018-02-04 NOTE — Anesthesia Preprocedure Evaluation (Signed)
Anesthesia Evaluation  Patient identified by MRN, date of birth, ID band Patient awake    Reviewed: Allergy & Precautions, NPO status , Patient's Chart, lab work & pertinent test results  Airway Mallampati: II  TM Distance: >3 FB Neck ROM: Full    Dental no notable dental hx.    Pulmonary neg pulmonary ROS,    Pulmonary exam normal breath sounds clear to auscultation       Cardiovascular negative cardio ROS Normal cardiovascular exam Rhythm:Regular Rate:Normal     Neuro/Psych negative neurological ROS  negative psych ROS   GI/Hepatic negative GI ROS, Neg liver ROS,   Endo/Other  negative endocrine ROS  Renal/GU negative Renal ROS  negative genitourinary   Musculoskeletal negative musculoskeletal ROS (+)   Abdominal   Peds negative pediatric ROS (+)  Hematology negative hematology ROS (+)   Anesthesia Other Findings   Reproductive/Obstetrics (+) Pregnancy                             Anesthesia Physical Anesthesia Plan  ASA: II  Anesthesia Plan: Spinal   Post-op Pain Management:    Induction:   PONV Risk Score and Plan: Ondansetron, Scopolamine patch - Pre-op and Treatment may vary due to age or medical condition  Airway Management Planned: Natural Airway  Additional Equipment:   Intra-op Plan:   Post-operative Plan:   Informed Consent: I have reviewed the patients History and Physical, chart, labs and discussed the procedure including the risks, benefits and alternatives for the proposed anesthesia with the patient or authorized representative who has indicated his/her understanding and acceptance.   Dental advisory given  Plan Discussed with:   Anesthesia Plan Comments:         Anesthesia Quick Evaluation

## 2018-02-04 NOTE — Transfer of Care (Signed)
Immediate Anesthesia Transfer of Care Note  Patient: Sheri Malone  Procedure(s) Performed: Repeat CESAREAN SECTION (N/A Abdomen) UNILATERAL SALPINGECTOMY (Right Abdomen)  Patient Location: PACU  Anesthesia Type:Spinal  Level of Consciousness: awake, alert  and oriented  Airway & Oxygen Therapy: Patient Spontanous Breathing  Post-op Assessment: Report given to RN and Post -op Vital signs reviewed and stable  Post vital signs: Reviewed and stable  Last Vitals:  Vitals Value Taken Time  BP 122/85 02/04/2018  9:43 AM  Temp    Pulse 86 02/04/2018  9:45 AM  Resp    SpO2 100 % 02/04/2018  9:45 AM  Vitals shown include unvalidated device data.  Last Pain:  Vitals:   02/04/18 0745  TempSrc:   PainSc: 8          Complications: No apparent anesthesia complications

## 2018-02-04 NOTE — MAU Note (Signed)
Pt reports contractions every 5-10 minutes that started at 3:30am. Pt denies LOF or vaginal bleeding. Reports good fetal movement. States cervix was closed on Tuesday.

## 2018-02-04 NOTE — Anesthesia Postprocedure Evaluation (Signed)
Anesthesia Post Note  Patient: Sheri Malone  Procedure(s) Performed: Repeat CESAREAN SECTION (N/A Abdomen) UNILATERAL SALPINGECTOMY (Right Abdomen)     Patient location during evaluation: PACU Anesthesia Type: Spinal Level of consciousness: awake and alert Pain management: pain level controlled Vital Signs Assessment: post-procedure vital signs reviewed and stable Respiratory status: spontaneous breathing and respiratory function stable Cardiovascular status: blood pressure returned to baseline and stable Postop Assessment: no headache, no backache, spinal receding and no apparent nausea or vomiting Anesthetic complications: no    Last Vitals:  Vitals:   02/04/18 1045 02/04/18 1151  BP: 131/89 127/79  Pulse: 87 84  Resp: 16 20  Temp: 36.9 C   SpO2: 100% 98%    Last Pain:  Vitals:   02/04/18 1150  TempSrc:   PainSc: 7    Pain Goal:                 Phillips Groutarignan, Xela Oregel

## 2018-02-04 NOTE — H&P (Signed)
CC: active labor, prior c/s,   HPI: ctx since 3 am, now 3cm with ctrx regular, q 4min. No SROM, active FM, no LOF. GB pain stable.  PN Issues: Undesired fertility, planning salpingectomy Cholecystitis  Past Medical History:  Diagnosis Date  . Abnormal Pap smear   . GERD (gastroesophageal reflux disease)   . Gestational HTN   . Headache(784.0)   . History of ectopic pregnancy   . History of gestational diabetes   . Hypertension   . Low hemoglobin    on ironspan  . Rhinitis   . Vaginal Pap smear, abnormal     Past Surgical History:  Procedure Laterality Date  . CESAREAN SECTION N/A 05/20/2013   Procedure: CESAREAN SECTION;  Surgeon: Genia DelMarie-Lyne Lavoie, MD;  Location: [facility];  Service: Obstetrics;  Laterality: N/A;  primary  . DILATION AND CURETTAGE OF UTERUS  2009  . SALPINGOOPHORECTOMY  2009   left  . WISDOM TOOTH EXTRACTION      PE: Vitals:   02/04/18 0634 02/04/18 0640 02/04/18 0655  BP:  (!) 141/84 128/88  Pulse:  93 93  Resp:  19   Temp:  97.6 F (36.4 C)   TempSrc:  Oral   SpO2:   99%  Weight: 98.9 kg (218 lb)    Height: 5\' 3"  (1.6 m)     Abd: gravid, NT GU: per nursing notes LE: tr edema, SCDs in place  FHT's reactive Ctx q474min  CBC    Component Value Date/Time   WBC 9.2 02/04/2018 0700   RBC 4.82 02/04/2018 0700   HGB 12.2 02/04/2018 0700   HGB 11.1 11/10/2012   HCT 37.3 02/04/2018 0700   HCT 33 11/10/2012   PLT 249 02/04/2018 0700   PLT 343 11/10/2012   MCV 77.4 (L) 02/04/2018 0700   MCH 25.3 (L) 02/04/2018 0700   MCHC 32.7 02/04/2018 0700   RDW 15.7 (H) 02/04/2018 0700    A/P: RCS with b/l salpingectomy.  R/b reviewed with pt.   Lendon ColonelKelly A Loran Fleet 02/04/2018 7:42 AM

## 2018-02-04 NOTE — MAU Note (Signed)
MD Notified and is going to notify OR Anesthesia Notified  OB Coordinator Notified House Notified  MB Notified.

## 2018-02-05 ENCOUNTER — Encounter (HOSPITAL_COMMUNITY): Payer: Self-pay | Admitting: *Deleted

## 2018-02-05 ENCOUNTER — Other Ambulatory Visit: Payer: Self-pay

## 2018-02-05 DIAGNOSIS — D62 Acute posthemorrhagic anemia: Secondary | ICD-10-CM | POA: Diagnosis present

## 2018-02-05 LAB — CBC
HCT: 29.4 % — ABNORMAL LOW (ref 36.0–46.0)
Hemoglobin: 9.6 g/dL — ABNORMAL LOW (ref 12.0–15.0)
MCH: 25.6 pg — ABNORMAL LOW (ref 26.0–34.0)
MCHC: 32.7 g/dL (ref 30.0–36.0)
MCV: 78.4 fL (ref 78.0–100.0)
Platelets: 208 10*3/uL (ref 150–400)
RBC: 3.75 MIL/uL — ABNORMAL LOW (ref 3.87–5.11)
RDW: 15.9 % — ABNORMAL HIGH (ref 11.5–15.5)
WBC: 11.1 10*3/uL — ABNORMAL HIGH (ref 4.0–10.5)

## 2018-02-05 MED ORDER — MAGNESIUM OXIDE 400 (241.3 MG) MG PO TABS
400.0000 mg | ORAL_TABLET | Freq: Every day | ORAL | Status: DC
  Administered 2018-02-06: 400 mg via ORAL
  Filled 2018-02-05 (×2): qty 1

## 2018-02-05 MED ORDER — POLYSACCHARIDE IRON COMPLEX 150 MG PO CAPS
150.0000 mg | ORAL_CAPSULE | Freq: Every day | ORAL | Status: DC
  Administered 2018-02-06: 150 mg via ORAL
  Filled 2018-02-05: qty 1

## 2018-02-05 NOTE — Progress Notes (Signed)
POSTOPERATIVE DAY # 1 S/P CS-repeat (labor onset)  S:         Reports feeling ok             Tolerating po intake / no nausea / no vomiting / no flatus / no BM             Bleeding is light             Pain controlled with long-acting narcotic             Up ad lib / ambulatory/ voiding QS  Newborn Breast / Circumcision planned  O:  VS: BP 115/71 (BP Location: Right Arm)   Pulse 87   Temp 98.7 F (37.1 C) (Oral)   Resp 16   Ht 5\' 3"  (1.6 m)   Wt 98.9 kg (218 lb)   LMP 03/19/2017 (Exact Date)   SpO2 99%   Breastfeeding? Unknown   BMI 38.62 kg/m    LABS:              Recent Labs    02/04/18 0700 02/05/18 0518  WBC 9.2 11.1*  HGB 12.2 9.6*  PLT 249 208               Bloodtype: --/--/O POS (04/18 0700)  Rubella: Immune (10/03 0000)                                           I&O: Intake/Output      04/18 0701 - 04/19 0700 04/19 0701 - 04/20 0700   P.O. 720    I.V. (mL/kg) 2580.2 (26.1)    Total Intake(mL/kg) 3300.2 (33.4)    Urine (mL/kg/hr) 3850 (1.6)    Blood 1095    Total Output 4945    Net -1644.8                   Physical Exam:             Alert and Oriented X3 - sitting up in chair  Lungs: Clear and unlabored  Heart: regular rate and rhythm / no mumurs  Abdomen: soft, non-tender, non-distended              Fundus: firm, non-tender, Ueven             Dressing pressure pressure intact              Lochia: light  Extremities: 1+ pedal edema, no calf pain or tenderness, SCD in place  A:        POD # 1 S/P CS-repeat            Mild ABL anemia  P:        Routine postoperative care              Consider early DC tomorrow     Marlinda Mikeanya Dequavion Follette CNM, MSN, [facility] 02/05/2018, 8:01 AM

## 2018-02-05 NOTE — Op Note (Signed)
02/04/2018  2:51 PM  PATIENT:  Sheri Malone  32 y.o. female  PRE-OPERATIVE DIAGNOSIS:  Previous Cesarean Section; undesired fertility; labor  POST-OPERATIVE DIAGNOSIS:  Previous Cesarean Section; undesired fertility; labor  PROCEDURE:  Procedure(s) with comments: Repeat CESAREAN SECTION (N/A) - EDD: 02/16/18 UNILATERAL SALPINGECTOMY (Right)  SURGEON:  Surgeon(s) and Role: Panel 1:    Noland Fordyce, MD - Primary Panel 2:    * Noland Fordyce, MD - Primary  PHYSICIAN ASSISTANT:   ASSISTANTS: none   ANESTHESIA:   spinal  EBL:  1095 mL   BLOOD ADMINISTERED:none  DRAINS: Urinary Catheter (Foley)   LOCAL MEDICATIONS USED:  NONE  SPECIMEN:  Source of Specimen:  placenta  DISPOSITION OF SPECIMEN:  L&D  COUNTS:  YES  TOURNIQUET:  * No tourniquets in log *  DICTATION: .Note written in EPIC  PLAN OF CARE: Admit to inpatient   PATIENT DISPOSITION:  PACU - hemodynamically stable.   Delay start of Pharmacological VTE agent (>24hrs) due to surgical blood loss or risk of bleeding: yes     Findings:  @BABYSEXEBC @ infant,  APGAR (1 MIN): 8   APGAR (5 MINS): 8   APGAR (10 MINS):   Normal uterus and ovaries, absent left tube, normal placenta. 3VC, thick meconium stained amniotic fluid   Antibiotics:   2g Ancef Complications: none  Indications: This is a 32 y.o. year-old, G3P1011  At [redacted]w[redacted]d admitted for active labor with plans for RCS and salpingectomy. Risks benefits and alternatives of the procedure were discussed with the patient who agreed to proceed  Procedure:  After informed consent was obtained the patient was taken to the operating room where spinal anesthesia was initiated.  She was prepped and draped in the normal sterile fashion in dorsal supine position with a leftward tilt.  A foley catheter was in place.  A Pfannenstiel skin incision was made 2 cm above the pubic symphysis in the midline with the scalpel, over the prior scar.  Dissection was carried down  with the Bovie cautery until the fascia was reached. The fascia was incised in the midline. The incision was extended laterally with the Mayo scissors. The inferior aspect of the fascial incision was grasped with the Coker clamps, elevated up and the underlying rectus muscles were dissected off sharply. The superior aspect of the fascial incision was grasped with the Coker clamps elevated up and the underlying rectus muscles were dissected off sharply.  The peritoneum was entered bluntly. The peritoneal incision was extended superiorly and inferiorly with good visualization of the bladder. The bladder blade was inserted and palpation was done to assess the fetal position and the location of the uterine vessels. The lower segment of the uterus was incised sharply with the scalpel and extended  bluntly in the cephalo-caudal fashion. The infant was grasped, brought to the incision,  rotated and the infant was delivered with fundal pressure. The nose and mouth were bulb suctioned. The cord was clamped and cut after 1 minute delay. The infant was handed off to the waiting pediatrician. The placenta was expressed. The uterus was exteriorized. The uterus was cleared of all clots and debris. The uterine incision was repaired with 0 Vicryl in a running locked fashion.  A second layer of the same suture was used in an imbricating fashion to obtain excellent hemostasis. Multiple additional figure of 8 sutures were needed for hemostasis. The left tube was absent. The right tube was placed on stretch, a window was created in the [place], a [employer]  clamp was placed under the distal end of the tube through the created window. A free tie x 1 then excision with bovie cautery. A second Kelley clamp was placed under the mesosalpynx and across the proximal tubal end. Free tie x 1 and excision with cautery.  The uterus was then returned to the abdomen, the gutters were cleared of all clots and debris. The uterine incision was  reinspected and found to be hemostatic. The tubal site was hemostatic. The peritoneum was grasped and closed with 2-0 Vicryl in a running fashion. The cut muscle edges and the underside of the fascia were inspected and found to be hemostatic. The fascia was closed with 0 Vicryl in a single layer . The subcutaneous tissue was irrigated. Scarpa's layer was closed with a 2-0 plain gut suture. The skin was closed with a 4-0 Monocryl in a single layer. The patient tolerated the procedure well. Sponge lap and needle counts were correct x3 and patient was taken to the recovery room in a stable condition.  Lendon ColonelKelly A Riane Rung 02/05/2018 2:52 PM

## 2018-02-05 NOTE — Brief Op Note (Signed)
02/04/2018  2:51 PM  PATIENT:  Sheri Malone  32 y.o. female  PRE-OPERATIVE DIAGNOSIS:  Previous Cesarean Section; undesired fertility; labor  POST-OPERATIVE DIAGNOSIS:  Previous Cesarean Section; undesired fertility; labor  PROCEDURE:  Procedure(s) with comments: Repeat CESAREAN SECTION (N/A) - EDD: 02/16/18 UNILATERAL SALPINGECTOMY (Right)  SURGEON:  Surgeon(s) and Role: Panel 1:    Noland Fordyce* Junko Ohagan, MD - Primary Panel 2:    * Noland FordyceFogleman, Amyrah Pinkhasov, MD - Primary  PHYSICIAN ASSISTANT:   ASSISTANTS: none   ANESTHESIA:   spinal  EBL:  1095 mL   BLOOD ADMINISTERED:none  DRAINS: Urinary Catheter (Foley)   LOCAL MEDICATIONS USED:  NONE  SPECIMEN:  Source of Specimen:  placenta  DISPOSITION OF SPECIMEN:  L&D  COUNTS:  YES  TOURNIQUET:  * No tourniquets in log *  DICTATION: .Note written in EPIC  PLAN OF CARE: Admit to inpatient   PATIENT DISPOSITION:  PACU - hemodynamically stable.   Delay start of Pharmacological VTE agent (>24hrs) due to surgical blood loss or risk of bleeding: yes

## 2018-02-05 NOTE — Lactation Note (Signed)
This note was copied from a baby's chart. Lactation Consultation Note Baby 17 hrs old. Mom BF when LC entered rm. In football position wrapped in blankets. Encouraged STS while feeding, also helps get a deeper latch when not swaddled.  Mom has a 674 yr old that she BF for 14 months w/o difficulty.   Mom has "V" shaped breast w/everted nipples. Hand expression demonstrated colostrum.  Reviewed newborn behavior briefly. Stressed importance of I&O.  Encouraged mom to call for assistance or questions. \ [facility] brochure given w/resources, support groups and LC services.  Patient Name: Boy Mendel RyderShvawn Hagemeister ZHYQM'VToday's Date: 02/05/2018 Reason for consult: Initial assessment;Early term 37-38.6wks   Maternal Data Has patient been taught Hand Expression?: Yes Does the patient have breastfeeding experience prior to this delivery?: Yes  Feeding Feeding Type: Breast Fed Length of feed: 10 min(still BF)  LATCH Score Latch: Grasps breast easily, tongue down, lips flanged, rhythmical sucking.  Audible Swallowing: A few with stimulation  Type of Nipple: Everted at rest and after stimulation  Comfort (Breast/Nipple): Soft / non-tender  Hold (Positioning): Assistance needed to correctly position infant at breast and maintain latch.  LATCH Score: 8  Interventions Interventions: Breast feeding basics reviewed;Support pillows;Position options;Breast massage;Hand express;Adjust position;Breast compression  Lactation Tools Discussed/Used     Consult Status Consult Status: Follow-up Date: 02/06/18 Follow-up type: In-patient    Charyl DancerCARVER, Thomasine Klutts G 02/05/2018, 2:32 AM

## 2018-02-06 MED ORDER — MAGNESIUM OXIDE 400 (241.3 MG) MG PO TABS: 400.0000 mg | ORAL_TABLET | Freq: Every day | ORAL | 0 refills | Status: DC

## 2018-02-06 MED ORDER — OXYCODONE HCL 5 MG PO TABS: 5.0000 mg | ORAL_TABLET | ORAL | 0 refills | Status: DC | PRN

## 2018-02-06 MED ORDER — IBUPROFEN 600 MG PO TABS: 600.0000 mg | ORAL_TABLET | Freq: Four times a day (QID) | ORAL | 0 refills | Status: DC

## 2018-02-06 NOTE — Progress Notes (Signed)
POSTOPERATIVE DAY # 2 S/P CS   S:         Reports feeling well - ready to go home             Tolerating po intake / no nausea / no vomiting / active flatus / no BM             Bleeding is light             Pain controlled with motrin and oxycodone             Up ad lib / ambulatory/ voiding QS  Newborn Breast    O:  VS: BP 123/78 (BP Location: Right Arm)   Pulse 92   Temp 98.3 F (36.8 C) (Oral)   Resp 18   Ht 5\' 3"  (1.6 m)   Wt 98.9 kg (218 lb)   LMP 03/19/2017 (Exact Date)   SpO2 99%   Breastfeeding? Unknown   BMI 38.62 kg/m    LABS:               Recent Labs    02/04/18 0700 02/05/18 0518  WBC 9.2 11.1*  HGB 12.2 9.6*  PLT 249 208               Bloodtype: --/--/O POS (04/18 0700)  Rubella: Immune (10/03 0000)                                Physical Exam:             Alert and Oriented X3  Abdomen: soft, non-tender, non-distended, Active BS             Fundus: firm, non-tender, Ueven             Dressing intact honeycomb              Incision:  approximated with suture / no erythema / no ecchymosis / no drainage  Perineum: intact  Lochia: light  Extremities: trace edema, no calf pain or tenderness, negative Homans  A:        POD # 2 S/P CS             P:        Routine postoperative care              DC home - [facility] booklet - instructions reviewed     Marlinda Mikeanya Garrison Michie CNM, MSN, [facility] 02/06/2018, 11:10 AM

## 2018-02-06 NOTE — Lactation Note (Signed)
This note was copied from a baby's chart. Lactation Consultation Note  Mother reports that infant is feeding well. When I arrived in room mother was breastfeeding infant in football hold on the right breast. Mother reports that infant has been feeding for 20 mins. Assist mother with breast compression and flanging infants lips for wider gape.  Advised mother to continue to breastfeed infant 8-12 times in 24 hours and with all feeding cues. Discussed cluster feeding and skin to skin. Advised mother in treatment and prevention of engorgement.  Mother receptive to all teaching and is aware of all available LC services.   Patient Name: Sheri Mendel RyderShvawn Mccarney ZOXWR'UToday's Date: 02/06/2018 Reason for consult: Follow-up assessment   Maternal Data    Feeding Feeding Type: Breast Fed Length of feed: 20 min  LATCH Score Latch: Grasps breast easily, tongue down, lips flanged, rhythmical sucking.  Audible Swallowing: Spontaneous and intermittent  Type of Nipple: Everted at rest and after stimulation  Comfort (Breast/Nipple): Filling, red/small blisters or bruises, mild/mod discomfort  Hold (Positioning): No assistance needed to correctly position infant at breast.  LATCH Score: 9  Interventions    Lactation Tools Discussed/Used     Consult Status Consult Status: Complete    Michel BickersKendrick, Ankush Gintz McCoy 02/06/2018, 11:47 AM

## 2018-02-06 NOTE — Discharge Summary (Signed)
POSTOPERATIVE DISCHARGE SUMMARY:  Patient ID: Sheri RyderShvawn Bugaj MRN: [id number] DOB/AGE: February 09, 1986 32 y.o.  Admit date: 02/04/2018 Admission Diagnoses: 38 weeks / previous C-section / onset of labor  Discharge date:  02/06/2018 Discharge Diagnoses: POD 2 s/p repeat CS  Prenatal history: [id number]   EDC : 02/16/2018, Alternate EDD Entry  [facility]  Primary provider : Ernestina PennaFogleman Prenatal course complicated by previous CS  Prenatal Labs: ABO, Rh: --/--/O POS (04/18 0700)  Antibody: NEG (04/18 0700) Rubella: Immune (10/03 0000)  RPR: Non Reactive (04/18 0700)  HBsAg: Negative (10/03 0000)  HIV: Non-reactive (10/03 0000)  GBS: Positive (04/04 0000)   Medical / Surgical History :  Past medical history:  Past Medical History:  Diagnosis Date  . Abnormal Pap smear   . GERD (gastroesophageal reflux disease)   . Gestational HTN   . Headache(784.0)   . History of ectopic pregnancy   . History of gestational diabetes   . Hypertension   . Low hemoglobin    on ironspan  . Rhinitis   . Vaginal Pap smear, abnormal     Past surgical history:  Past Surgical History:  Procedure Laterality Date  . CESAREAN SECTION N/A 05/20/2013   Procedure: CESAREAN SECTION;  Surgeon: Genia DelMarie-Lyne Lavoie, MD;  Location: [facility];  Service: Obstetrics;  Laterality: N/A;  primary  . CESAREAN SECTION N/A 02/04/2018   Procedure: Repeat CESAREAN SECTION;  Surgeon: Noland FordyceFogleman, Kelly, MD;  [facility];  Service: Obstetrics;  Laterality: N/A;  EDD: 02/16/18  . DILATION AND CURETTAGE OF UTERUS  2009  . SALPINGOOPHORECTOMY  2009   left  . UNILATERAL SALPINGECTOMY Right 02/04/2018   Procedure: UNILATERAL SALPINGECTOMY;  Surgeon: Noland FordyceFogleman, Kelly, MD;  [facility];  Service: Obstetrics;  Laterality: Right;  . WISDOM TOOTH EXTRACTION      Family History:  Family History  Problem Relation Age of Onset  . Diabetes Father   . Hypertension Father   .  Cancer Maternal Grandfather        prostate  . Heart disease Paternal Grandfather   . Migraines Mother   . Hypertension Mother   . Breast cancer Mother   . Multiple sclerosis Sister     Social History:  reports that she has never smoked. She has never used smokeless tobacco. She reports that she does not drink alcohol or use drugs.  Allergies: Dust mite mixed allergen ext [mite (d. farinae)] and Other   Current Medications at time of admission:  Prior to Admission medications   Medication Sig Start Date End Date Taking? Authorizing Provider  Fe Cbn-Fe Gluc-FA-B12-C-DSS (FERRALET 90) 90-1 MG TABS Take 1 tablet by mouth daily.   Yes [provider]  MONTELUKAST SODIUM PO Take by mouth.   Yes [provider]  oxyCODONE-acetaminophen (PERCOCET/ROXICET) 5-325 MG tablet Take 2 tablets by mouth every 4 (four) hours as needed for severe pain.   Yes [provider]  pantoprazole (PROTONIX) 40 MG tablet Take 40 mg by mouth daily.   Yes [provider]  Prenat-Fe Carbonyl-FA-Omega 3 (ONE-A-DAY WOMENS PRENATAL 1 PO) Take 1 tablet by mouth daily.   Yes [provider]  cyproheptadine (PERIACTIN) 4 MG tablet May take 1-2 tablets up to 3x a day. Take at the onset of headache. 09/15/17   Anson FretAhern, Antonia B, MD  EPINEPHrine (EPIPEN 2-PAK) 0.3 mg/0.3 mL IJ SOAJ injection Inject 0.3 mLs (0.3 mg total) into the muscle once. 06/21/15   Alba CorySowles, Krichna, MD  Intrapartum Course:  Admit for onset of  labor with planned c-section  Procedures: Cesarean section delivery on 02/04/2018 with delivery of viable female newborn by Dr Ernestina Penna   See operative report for further details APGAR (1 MIN): 8   APGAR (5 MINS): 8    Postoperative / postpartum course:  Uncomplicated with discharge on POD 2  Discharge Instructions:  Discharged Condition: stable  Activity: pelvic rest and postoperative restrictions x 2   Diet: routine  Medications:  Allergies as of 02/06/2018       Reactions   Dust Mite Mixed Allergen Ext [mite (d. Farinae)] Anaphylaxis   Other Anaphylaxis   DUST ROACH      Medication List    STOP taking these medications   oxyCODONE-acetaminophen 5-325 MG tablet Commonly known as:  PERCOCET/ROXICET     TAKE these medications   cyproheptadine 4 MG tablet Commonly known as:  PERIACTIN May take 1-2 tablets up to 3x a day. Take at the onset of headache.   EPINEPHrine 0.3 mg/0.3 mL Soaj injection Commonly known as:  EPIPEN 2-PAK Inject 0.3 mLs (0.3 mg total) into the muscle once.   FERRALET 90 90-1 MG Tabs Take 1 tablet by mouth daily.   ibuprofen 600 MG tablet Commonly known as:  ADVIL,MOTRIN Take 1 tablet (600 mg total) by mouth every 6 (six) hours.   magnesium oxide 400 (241.3 Mg) MG tablet Commonly known as:  MAG-OX Take 1 tablet (400 mg total) by mouth daily. Start taking on:  02/07/2018   MONTELUKAST SODIUM PO Take by mouth.   ONE-A-DAY WOMENS PRENATAL 1 PO Take 1 tablet by mouth daily.   oxyCODONE 5 MG immediate release tablet Commonly known as:  Oxy IR/ROXICODONE Take 1 tablet (5 mg total) by mouth every 4 (four) hours as needed (mild-moderate pain).   pantoprazole 40 MG tablet Commonly known as:  PROTONIX Take 40 mg by mouth daily.       Wound Care: keep clean and dry / remove honeycomb POD 4 Postpartum Instructions: [facility] discharge booklet - instructions reviewed  Discharge to: Home  Follow up :  Home visit for universal visiting nurse at 1 week [facility] in 6 weeks for routine postpartum visit with Dr Ernestina Penna                Signed: Marlinda Mike CNM, MSN, [facility] 02/06/2018, 11:14 AM

## 2018-02-11 ENCOUNTER — Encounter (HOSPITAL_COMMUNITY): Admission: RE | Admit: 2018-02-11 | Payer: 59 | Source: Ambulatory Visit

## 2018-02-12 ENCOUNTER — Encounter (HOSPITAL_COMMUNITY)
Admission: RE | Admit: 2018-02-12 | Discharge: 2018-02-12 | Disposition: A | Discharge status: Home / Self Care | Payer: 59 | Source: Ambulatory Visit

## 2018-02-12 DIAGNOSIS — Z5189 Encounter for other specified aftercare: Secondary | ICD-10-CM | POA: Diagnosis not present

## 2018-02-12 DIAGNOSIS — L0390 Cellulitis, unspecified: Secondary | ICD-10-CM | POA: Diagnosis not present

## 2018-02-12 HISTORY — DX: Unspecified abnormal cytological findings in specimens from vagina: R87.629

## 2018-02-15 ENCOUNTER — Inpatient Hospital Stay (HOSPITAL_COMMUNITY): Admit: 2018-02-15 | Payer: 59 | Admitting: Obstetrics

## 2018-02-19 DIAGNOSIS — L0390 Cellulitis, unspecified: Secondary | ICD-10-CM | POA: Diagnosis not present

## 2018-02-19 DIAGNOSIS — Z5189 Encounter for other specified aftercare: Secondary | ICD-10-CM | POA: Diagnosis not present

## 2018-03-04 ENCOUNTER — Other Ambulatory Visit: Payer: Self-pay | Admitting: Surgery

## 2018-03-04 DIAGNOSIS — K8020 Calculus of gallbladder without cholecystitis without obstruction: Secondary | ICD-10-CM | POA: Diagnosis not present

## 2018-04-05 ENCOUNTER — Encounter (HOSPITAL_BASED_OUTPATIENT_CLINIC_OR_DEPARTMENT_OTHER): Payer: Self-pay

## 2018-04-05 ENCOUNTER — Ambulatory Visit (HOSPITAL_BASED_OUTPATIENT_CLINIC_OR_DEPARTMENT_OTHER): Admit: 2018-04-05 | Payer: 59 | Admitting: Surgery

## 2018-04-05 SURGERY — LAPAROSCOPIC CHOLECYSTECTOMY
Anesthesia: General

## 2018-05-10 ENCOUNTER — Ambulatory Visit: Payer: Self-pay | Admitting: Family Medicine

## 2018-05-17 ENCOUNTER — Ambulatory Visit
Admission: RE | Admit: 2018-05-17 | Discharge: 2018-05-17 | Disposition: A | Discharge status: Home / Self Care | Payer: 59 | Source: Ambulatory Visit | Attending: Nurse Practitioner | Admitting: Nurse Practitioner

## 2018-05-17 ENCOUNTER — Ambulatory Visit: Payer: 59 | Admitting: Nurse Practitioner

## 2018-05-17 ENCOUNTER — Encounter: Payer: Self-pay | Admitting: Nurse Practitioner

## 2018-05-17 VITALS — BP 100/70 | HR 90 | Temp 98.1°F | Resp 16 | Ht 63.0 in | Wt 199.3 lb

## 2018-05-17 DIAGNOSIS — S92514A Nondisplaced fracture of proximal phalanx of right lesser toe(s), initial encounter for closed fracture: Secondary | ICD-10-CM | POA: Insufficient documentation

## 2018-05-17 DIAGNOSIS — X58XXXA Exposure to other specified factors, initial encounter: Secondary | ICD-10-CM | POA: Insufficient documentation

## 2018-05-17 DIAGNOSIS — M79674 Pain in right toe(s): Secondary | ICD-10-CM

## 2018-05-17 DIAGNOSIS — S92511A Displaced fracture of proximal phalanx of right lesser toe(s), initial encounter for closed fracture: Secondary | ICD-10-CM | POA: Diagnosis not present

## 2018-05-17 NOTE — Progress Notes (Addendum)
Name: Sheri Malone   MRN: [id number]    DOB: 28-Aug-1986   Date:05/17/2018       Progress Note  Subjective  Chief Complaint  Chief Complaint  Patient presents with  . Toe Pain    HPI  Patient has right foot 5th digit pain after stubbing toe on high chair over a week ago. Patient has tried ice, and elevation states swelling has gone down but pain is still present. Pain is constant and aching, tried wearing sneakers and strapped shoes because pressure on it makes it feel better.   No history of gout.  Patient Active Problem List   Diagnosis Date Noted  . Acute blood loss anemia 02/05/2018  . Cesarean delivery delivered 4/18 02/04/2018  . Postpartum care following cesarean delivery (4/18) 02/04/2018  . Status post repeat low transverse cesarean section 02/04/2018  . Allergic rhinitis 11/23/2016  . Allergic conjunctivitis 01/17/2016  . Mild intermittent asthma 01/17/2016  . Dermatitis, eczematoid 06/21/2015  . H/O cesarean section 06/21/2015  . History of diabetes mellitus arising in pregnancy 06/21/2015  . Headache, migraine 06/21/2015    Past Medical History:  Diagnosis Date  . Abnormal Pap smear   . GERD (gastroesophageal reflux disease)   . Gestational HTN   . Headache(784.0)   . History of ectopic pregnancy   . History of gestational diabetes   . Hypertension   . Low hemoglobin    on ironspan  . Rhinitis   . Vaginal Pap smear, abnormal     Past Surgical History:  Procedure Laterality Date  . CESAREAN SECTION N/A 05/20/2013   Procedure: CESAREAN SECTION;  Surgeon: Genia DelMarie-Lyne Lavoie, MD;  Location: [facility];  Service: Obstetrics;  Laterality: N/A;  primary  . CESAREAN SECTION N/A 02/04/2018   Procedure: Repeat CESAREAN SECTION;  Surgeon: Noland FordyceFogleman, Kelly, MD;  [facility];  Service: Obstetrics;  Laterality: N/A;  EDD: 02/16/18  . DILATION AND CURETTAGE OF UTERUS  2009  . SALPINGOOPHORECTOMY  2009   left  . UNILATERAL SALPINGECTOMY Right 02/04/2018    Procedure: UNILATERAL SALPINGECTOMY;  Surgeon: Noland FordyceFogleman, Kelly, MD;  [facility];  Service: Obstetrics;  Laterality: Right;  . WISDOM TOOTH EXTRACTION      Social History   Tobacco Use  . Smoking status: Never Smoker  . Smokeless tobacco: Never Used  Substance Use Topics  . Alcohol use: No    Alcohol/week: 0.0 oz    Frequency: Never     Current Outpatient Medications:  .  Albuterol Sulfate POWD, by Does not apply route., Disp: , Rfl:  .  EPINEPHrine (EPIPEN 2-PAK) 0.3 mg/0.3 mL IJ SOAJ injection, Inject 0.3 mLs (0.3 mg total) into the muscle once., Disp: 1 Device, Rfl: 0 .  Prenat-Fe Carbonyl-FA-Omega 3 (ONE-A-DAY WOMENS PRENATAL 1 PO), Take 1 tablet by mouth daily., Disp: , Rfl:  .  ranitidine (ZANTAC) 150 MG tablet, Take 150 mg by mouth 2 (two) times daily., Disp: , Rfl:   Allergies  Allergen Reactions  . Dust Mite Mixed Allergen Ext [Mite (D. Farinae)] Anaphylaxis  . Other Anaphylaxis    DUST ROACH    Review of Systems  Respiratory: Negative for shortness of breath.   Cardiovascular: Negative for chest pain.  Gastrointestinal: Negative for abdominal pain.  Musculoskeletal: Negative for back pain, joint pain and myalgias.  Neurological: Negative for tingling and weakness.    No other specific complaints in a complete review of systems (except as listed in HPI above).  Objective  Vitals:   05/17/18  0952  BP: 100/70  Pulse: 90  Resp: 16  Temp: 98.1 F (36.7 C)  TempSrc: Oral  SpO2: 98%  Weight: 199 lb 4.8 oz (90.4 kg)  Height: 5\' 3"  (1.6 m)    Body mass index is 35.3 kg/m.  Nursing Note and Vital Signs reviewed.  Physical Exam  Constitutional: She is oriented to person, place, and time. She appears well-developed and well-nourished.  Cardiovascular: Normal rate.  Pulmonary/Chest: Effort normal.  Musculoskeletal:       Right foot: There is normal range of motion and no deformity (tenderness to 5th digit, no obvious deformity, swelling  or redness).  Feet:  Right Foot:  Skin Integrity: Negative for erythema, warmth, callus or dry skin.  Neurological: She is alert and oriented to person, place, and time.  Skin: Skin is warm and dry.  Psychiatric: She has a normal mood and affect. Her behavior is normal. Judgment and thought content normal.     No results found for this or any previous visit (from the past 48 hour(s)).  Assessment & Plan 1. Toe pain, right Ice, compression, buddy tape, protective shoes, tylenol PRN.  - DG Foot Complete Right; Future   -Red flags and when to present for emergency care or RTC including fever >101.42F, new/worsening/un-resolving symptoms, reviewed with patient at time of visit. Follow up and care instructions discussed and provided in AVS.  ------------------------------- I have reviewed this encounter including the documentation in this note and/or discussed this patient with the provider, Sharyon Cable DNP AGNP-C. I am certifying that I agree with the content of this note as supervising physician. Baruch Gouty, MD [facility] 05/18/2018, 5:52 PM

## 2018-05-17 NOTE — Patient Instructions (Addendum)
- Continue Ice, rest as often as you can, and use buddy tape during the day - Wear protective shoe wear. Toe Fracture Rehab Ask your health care provider which exercises are safe for you. Do exercises exactly as told by your health care provider and adjust them as directed. It is normal to feel mild stretching, pulling, tightness, or discomfort as you do these exercises, but you should stop right away if you feel sudden pain or your pain gets worse.Do not begin these exercises until told by your health care provider. Stretching and range of motion exercises These exercises warm up your muscles and joints and improve the movement and flexibility of your foot. These exercises also help to relieve pain, numbness, and tingling. Exercise A: Plantar flexionwith toe extension, seated 1. Sit on a chair that lets your feet rest flat on the floor. 2. Keeping your toes firmly on the ground, lift your left / right heel off the ground. You should feel a stretch under your toes. 3. Hold this position for __________ seconds. 4. Bring your heel back down to the floor. Repeat __________ times. Complete this exercise __________ times a day. Exercise B: Ankle alphabet  1. Sit with your left / right leg supported at the lower leg. ? Do not rest your foot on anything. ? Make sure your foot has room to move freely. 2. Think of your left / right foot as a paintbrush, and move your foot to trace each letter of the alphabet in the air. Keep your hip and knee still while you trace. Make the letters as large as you can without increasing any discomfort. 3. Trace every letter from A to Z. Repeat __________ times. Complete this exercise __________ times a day. Exercise C: Assisted toe flexion  1. Sit with your left / rightleg crossed over your opposite knee. 2. Hold your left / rightankle with your hand from the same side of your body. With your other hand, hold your toes. 3. Use the hand that is holding your toes to  gently curl your toes down toward the bottom of your foot. You should feel a stretch on the top of your toes and foot. 4. Hold this position for __________ seconds. Repeat __________ times. Complete this exercise __________ times a day. Strengthening exercise This exercise builds strength and endurance in your foot. Endurance is the ability to use your muscles for a long time, even after they get tired. Exercise D: Towel curls  1. Sit in a chair on a non-carpeted surface, and put your feet on the floor. 2. Place a towel in front of your feet. If told by your health care provider, add __________ to the end of the towel. 3. Keeping your heel on the floor, put your left / right foot on the towel. 4. Pull the towel toward you by grabbing the towel with your left / righttoes and curling them under. Keep your heel on the floor while you do this. 5. Let your toes relax. 6. Grab the towel again. Keep going until the towel is completely underneath your foot. Repeat __________ times. Complete this exercise __________ times a day. Balance exercise This exercise will help improve the control of your toe and foot when you are standing or walking. Exercise E: Single leg stand 1. Without shoes, stand near a railing or in a doorway. You may hold onto the railing or door frame as needed for balance. 2. Stand on your left / rightfoot. Keep your big toe down  on the floor and try to keep your arch lifted. 3. Hold this position for __________ seconds. 4. If this exercise is too easy, you can try doing it with your eyes closed or while standing on a pillow. Repeat __________ times. Complete this exercise __________ times a day. This information is not intended to replace advice given to you by your health care provider. Make sure you discuss any questions you have with your health care provider. Document Released: 10/06/2005 Document Revised: 06/12/2016 Document Reviewed: 06/12/2015 Elsevier Interactive Patient  Education  [employer].

## 2018-06-08 ENCOUNTER — Encounter (HOSPITAL_COMMUNITY): Payer: Self-pay

## 2018-06-08 ENCOUNTER — Other Ambulatory Visit: Payer: Self-pay

## 2018-06-08 ENCOUNTER — Ambulatory Visit (HOSPITAL_COMMUNITY)
Admission: EM | Admit: 2018-06-08 | Discharge: 2018-06-08 | Disposition: A | Discharge status: Home / Self Care | Payer: 59 | Attending: Family Medicine | Admitting: Family Medicine

## 2018-06-08 DIAGNOSIS — M542 Cervicalgia: Secondary | ICD-10-CM

## 2018-06-08 MED ORDER — IBUPROFEN 800 MG PO TABS: 800.0000 mg | ORAL_TABLET | Freq: Three times a day (TID) | ORAL | 0 refills | Status: DC | PRN

## 2018-06-08 MED ORDER — CYCLOBENZAPRINE HCL 5 MG PO TABS: 5.0000 mg | ORAL_TABLET | Freq: Three times a day (TID) | ORAL | 0 refills | Status: DC | PRN

## 2018-06-08 NOTE — ED Triage Notes (Signed)
Neck pain and headache 2 weeks

## 2018-06-08 NOTE — ED Provider Notes (Signed)
[facility]    CSN: [id number] Arrival date & time: 06/08/18  [id number]     History   Chief Complaint Chief Complaint  Patient presents with  . Torticollis  . Headache    HPI Sheri Malone is a 32 y.o. female.   HPI  Patient is here for neck pain.  She woke up with it.  No accident or fall.  No activity to strain her neck.  She does have a new baby at home, born in April.  She does not sleep well because of a frequent nursing schedule.  She works as a [employer] work.  She is taking Tylenol for pain.  She has limited movement in her neck.  Is only on the left side.  Causing her to have a headache.  No recent illness or fever.  She has had similar neck pain in the past for which she received physical therapy.  She remembers her physical therapy exercises and has been trying to do some stretching.  So far nothing has helped.  No neck x-ray in the past. Patient has no radiation of pain.  No numbness or weakness.  It is worse with movement, but present even at rest.  Past Medical History:  Diagnosis Date  . Abnormal Pap smear   . GERD (gastroesophageal reflux disease)   . Gestational HTN   . Headache(784.0)   . History of ectopic pregnancy   . History of gestational diabetes   . Hypertension   . Low hemoglobin    on ironspan  . Rhinitis   . Vaginal Pap smear, abnormal     Patient Active Problem List   Diagnosis Date Noted  . Acute blood loss anemia 02/05/2018  . Cesarean delivery delivered 4/18 02/04/2018  . Postpartum care following cesarean delivery (4/18) 02/04/2018  . Status post repeat low transverse cesarean section 02/04/2018  . Allergic rhinitis 11/23/2016  . Allergic conjunctivitis 01/17/2016  . Mild intermittent asthma 01/17/2016  . Dermatitis, eczematoid 06/21/2015  . H/O cesarean section 06/21/2015  . History of diabetes mellitus arising in pregnancy 06/21/2015  . Headache, migraine 06/21/2015    Past Surgical History:  Procedure Laterality Date  .  CESAREAN SECTION N/A 05/20/2013   Procedure: CESAREAN SECTION;  Surgeon: Genia DelMarie-Lyne Lavoie, MD;  Location: [facility];  Service: Obstetrics;  Laterality: N/A;  primary  . CESAREAN SECTION N/A 02/04/2018   Procedure: Repeat CESAREAN SECTION;  Surgeon: Noland FordyceFogleman, Kelly, MD;  [facility];  Service: Obstetrics;  Laterality: N/A;  EDD: 02/16/18  . DILATION AND CURETTAGE OF UTERUS  2009  . SALPINGOOPHORECTOMY  2009   left  . UNILATERAL SALPINGECTOMY Right 02/04/2018   Procedure: UNILATERAL SALPINGECTOMY;  Surgeon: Noland FordyceFogleman, Kelly, MD;  [facility];  Service: Obstetrics;  Laterality: Right;  . WISDOM TOOTH EXTRACTION      OB History    Gravida  3   Para  2   Term  2   Preterm      AB  1   Living  2     SAB      TAB      Ectopic  1   Multiple  0   Live Births  2            Home Medications    Prior to Admission medications   Medication Sig Start Date End Date Taking? Authorizing Provider  Albuterol Sulfate POWD by Does not apply route.    [provider]  cyclobenzaprine (FLEXERIL) 5 MG  tablet Take 1 tablet (5 mg total) by mouth 3 (three) times daily as needed for muscle spasms. 06/08/18   Eustace Moore, MD  EPINEPHrine (EPIPEN 2-PAK) 0.3 mg/0.3 mL IJ SOAJ injection Inject 0.3 mLs (0.3 mg total) into the muscle once. 06/21/15   Alba Cory, MD  ibuprofen (ADVIL,MOTRIN) 800 MG tablet Take 1 tablet (800 mg total) by mouth every 8 (eight) hours as needed for moderate pain. 06/08/18   Eustace Moore, MD  Prenat-Fe Carbonyl-FA-Omega 3 (ONE-A-DAY WOMENS PRENATAL 1 PO) Take 1 tablet by mouth daily.    [provider]  ranitidine (ZANTAC) 150 MG tablet Take 150 mg by mouth 2 (two) times daily.    [provider]    Family History Family History  Problem Relation Age of Onset  . Diabetes Father   . Hypertension Father   . Cancer Maternal Grandfather        prostate  . Heart disease Paternal Grandfather   .  Migraines Mother   . Hypertension Mother   . Breast cancer Mother   . Multiple sclerosis Sister     Social History Social History   Tobacco Use  . Smoking status: Never Smoker  . Smokeless tobacco: Never Used  Substance Use Topics  . Alcohol use: No    Alcohol/week: 0.0 standard drinks    Frequency: Never  . Drug use: No     Allergies   Dust mite mixed allergen ext [mite (d. farinae)] and Other   Review of Systems Review of Systems  Constitutional: Negative for chills and fever.  HENT: Negative for ear pain and sore throat.   Eyes: Negative for pain and visual disturbance.  Respiratory: Negative for cough and shortness of breath.   Cardiovascular: Negative for chest pain and palpitations.  Gastrointestinal: Negative for abdominal pain and vomiting.  Genitourinary: Negative for dysuria and hematuria.  Musculoskeletal: Positive for neck pain and neck stiffness. Negative for arthralgias and back pain.  Skin: Negative for color change and rash.  Neurological: Positive for headaches. Negative for seizures, syncope, weakness and numbness.  All other systems reviewed and are negative.    Physical Exam Triage Vital Signs ED Triage Vitals  Enc Vitals Group     BP 06/08/18 0908 124/87     Pulse Rate 06/08/18 0906 88     Resp 06/08/18 0906 16     Temp 06/08/18 0906 97.9 F (36.6 C)     Temp src --      SpO2 06/08/18 0906 99 %     Weight 06/08/18 0907 197 lb (89.4 kg)     Height --      Head Circumference --      Peak Flow --      Pain Score 06/08/18 0907 7     Pain Loc --      Pain Edu? --      Excl. in GC? --    No data found.  Updated Vital Signs BP 124/87   Pulse 88   Temp 97.9 F (36.6 C)   Resp 16   Wt 89.4 kg   SpO2 99%   BMI 34.90 kg/m   Visual Acuity Right Eye Distance:   Left Eye Distance:   Bilateral Distance:    Right Eye Near:   Left Eye Near:    Bilateral Near:     Physical Exam  Constitutional: She appears well-developed and  well-nourished. No distress.  Moderately stiff posture  HENT:  Head: Normocephalic and atraumatic.  Mouth/Throat: Oropharynx is clear and moist.  Eyes: Pupils are equal, round, and reactive to light. Conjunctivae are normal.  Neck:  Patient has tenderness in the left posterior column of muscles and left upper body of trapezius.  Limitation range of motion.  No bony tenderness.  Cardiovascular: Normal rate.  Pulmonary/Chest: Effort normal. No respiratory distress.  Abdominal: Soft. She exhibits no distension.  Musculoskeletal: Normal range of motion. She exhibits no edema.  Lymphadenopathy:    She has no cervical adenopathy.  Neurological: She is alert.  Strength sensation range of motion and reflexes are intact in both upper extremities.  Skin: Skin is warm and dry.  Psychiatric: She has a normal mood and affect. Her behavior is normal.     UC Treatments / Results  Labs (all labs ordered are listed, but only abnormal results are displayed) Labs Reviewed - No data to display  EKG None  Radiology No results found.  Procedures Procedures (including critical care time)  Medications Ordered in UC Medications - No data to display  Initial Impression / Assessment and Plan / UC Course  I have reviewed the triage vital signs and the nursing notes.  Pertinent labs & imaging results that were available during my care of the patient were reviewed by me and considered in my medical decision making (see chart for details).     Discussed muscular neck pain.  Conservative treatment.  I did look up cyclobenzaprine, and it is safe to take while nursing. Final Clinical Impressions(s) / UC Diagnoses   Final diagnoses:  Acute neck pain     Discharge Instructions     Take ibuprofen 3 times a day with food Take cyclobenzaprine as needed as muscle relaxer Ice or heat to area for comfort Follow-up with your provider if you fail to improve over the next week or 2   ED Prescriptions     Medication Sig Dispense Auth. Provider   cyclobenzaprine (FLEXERIL) 5 MG tablet Take 1 tablet (5 mg total) by mouth 3 (three) times daily as needed for muscle spasms. 30 tablet Eustace MooreNelson, Yvonne Sue, MD   ibuprofen (ADVIL,MOTRIN) 800 MG tablet Take 1 tablet (800 mg total) by mouth every 8 (eight) hours as needed for moderate pain. 90 tablet Eustace MooreNelson, Yvonne Sue, MD     Controlled Substance Prescriptions [place] Controlled Substance Registry consulted? Not Applicable   Eustace MooreNelson, Yvonne Sue, MD 06/08/18 1032

## 2018-06-08 NOTE — Discharge Instructions (Signed)
Take ibuprofen 3 times a day with food Take cyclobenzaprine as needed as muscle relaxer Ice or heat to area for comfort Follow-up with your provider if you fail to improve over the next week or 2

## 2018-08-23 NOTE — Progress Notes (Signed)
[facility]    Provider:  Dr Lucia Gaskins Referring Provider: Kerman Passey, MD Primary Care Physician:  Kerman Passey, MD  CC: Cervical neck pain -new concern  Interval history 08/24/2018: Patient is being seen today due to concerns of continuous bilateral neck pain that radiates down into bilateral shoulders with intermittent numbness sensation down left arm.  She does state that she experiences a slight headache that radiates up into her occipital region but denies any further location.  She does endorse a muscle tightness greater on her left side located near her left shoulder and left side of neck.  Denies any type of traumatic event or injury that could have occurred.  She does not believe that it is related to sleeping habits or sleeping in a bad position.  She was seen at urgent care on 06/18/2018 where was recommended to take ibuprofen and Flexeril as it is felt to be muscle related.  She is unable to take Flexeril during the day due to increased fatigue and would take 5 mg tablet at night but did not gain benefit from this.  She does take the ibuprofen on occasion which will take the edge off but it does not last long.  She is continuing to breast-feed at this time therefore she tries to limit amount of medication use.  She has attempted to use heat with only minimal relief.  Per review of notes, patient has had this ongoing complaint and underwent imaging on 04/01/2017 which was negative. She does have a history of migraines for which she was taking Periactin during pregnancy but has since stopped due to continued breast-feeding and in the very beginning, there was one occurrence where she did make the baby drowsy.  She does endorse migraines approximately 1-2 times per month located behind her eye which worsens with fatigue or lack of calories and associated with photophobia, phonophobia, nausea and lightheadedness.  She has not noticed any worsening of these migraines since  stopping the Periactin.   Interval history 12/15/2017:  Her migraines have improved. She is in her second trimester. More related to sleep and fatigue due to pregnancy. She had a headache the other day. Discussed migraines can improve in pregnancy. The Periactin helps. Taking it when she has headaches helps if she takes it in the start of headache. Helps. Discussed migraines and menopause. Mother had migraines, improved after menopause.     HPI:  Sheri Malone is a 32 y.o. female here as a referral from Dr. Sherie Don for headaches. Patient is approx [redacted] weeks pregnant.  She has had right arm and neck pain, had normal c-spine xrays, She has a history of migraines starting in adolescence. Birth control helped. She has used Fioricet in the past. Headaches are worsening. A few months ago she had a shoulder injury and PT has helped. These headaches are different, not eating triggers it, not sleeping well triggers headaches. Mother with migraines. It can start anywhere in the head, behind the eyes, can be unilateral, she has light/sound sensitivity, being in the dark helps, positional laying down makes it worse, movement makes it worse, sitting straight in a dark place is the best. Worsening, weekly. Could last 2 days. She has headaches 20 days a month and over half are migrainous. She has associated nausea. She has vision changes with the migraines. No hearing changes, no pulsating in the ears.   Reviewed notes, labs and imaging from outside physicians, which showed:  Personally reviewed images and agree with the  following:  There is no evidence of cervical spine fracture or prevertebral soft tissue swelling. Alignment is normal. No other significant bone abnormalities are identified.  IMPRESSION: Negative cervical spine radiographs.  CBC with anemia  Review of Systems: Patient complains of symptoms per HPI as well as the following symptoms: Eye pain, snoring, back pain, muscle cramps, neck pain, neck  stiffness, itchiness, dizziness and headaches. Pertinent negatives and positives per HPI. All others negative.   Social History   Socioeconomic History  . Marital status: Married    Spouse name: Not on file  . Number of children: Not on file  . Years of education: Not on file  . Highest education level: Not on file  Occupational History  . Not on file  Social Needs  . Financial resource strain: Not on file  . Food insecurity:    Worry: Not on file    Inability: Not on file  . Transportation needs:    Medical: Not on file    Non-medical: Not on file  Tobacco Use  . Smoking status: Never Smoker  . Smokeless tobacco: Never Used  Substance and Sexual Activity  . Alcohol use: No    Alcohol/week: 0.0 standard drinks    Frequency: Never  . Drug use: No  . Sexual activity: Yes    Partners: Male  Lifestyle  . Physical activity:    Days per week: Not on file    Minutes per session: Not on file  . Stress: Not on file  Relationships  . Social connections:    Talks on phone: Not on file    Gets together: Not on file    Attends religious service: Not on file    Active member of club or organization: Not on file    Attends meetings of clubs or organizations: Not on file    Relationship status: Not on file  . Intimate partner violence:    Fear of current or ex partner: Not on file    Emotionally abused: Not on file    Physically abused: Not on file    Forced sexual activity: Not on file  Other Topics Concern  . Not on file  Social History Narrative   Lives at home with husband and daughter. Pregnant with baby boy due 02/16/18.   Right handed   Drinks 2 cups of caffeine daily    Family History  Problem Relation Age of Onset  . Diabetes Father   . Hypertension Father   . Cancer Maternal Grandfather        prostate  . Heart disease Paternal Grandfather   . Migraines Mother   . Hypertension Mother   . Breast cancer Mother   . Multiple sclerosis Sister     Past Medical  History:  Diagnosis Date  . Abnormal Pap smear   . Asthma   . GERD (gastroesophageal reflux disease)   . Gestational HTN   . Headache(784.0)   . History of ectopic pregnancy   . History of gestational diabetes   . Hypertension   . Low hemoglobin    on ironspan  . Rhinitis   . Vaginal Pap smear, abnormal     Past Surgical History:  Procedure Laterality Date  . CESAREAN SECTION N/A 05/20/2013   Procedure: CESAREAN SECTION;  Surgeon: Genia Del, MD;  Location: [facility];  Service: Obstetrics;  Laterality: N/A;  primary  . CESAREAN SECTION N/A 02/04/2018   Procedure: Repeat CESAREAN SECTION;  Surgeon: Noland Fordyce, MD;  Location: [facility];  Service: Obstetrics;  Laterality: N/A;  EDD: 02/16/18  . DILATION AND CURETTAGE OF UTERUS  2009  . SALPINGOOPHORECTOMY  2009   left  . UNILATERAL SALPINGECTOMY Right 02/04/2018   Procedure: UNILATERAL SALPINGECTOMY;  Surgeon: Noland Fordyce, MD;  [facility];  Service: Obstetrics;  Laterality: Right;  . WISDOM TOOTH EXTRACTION      Current Outpatient Medications  Medication Sig Dispense Refill  . Albuterol Sulfate POWD by Does not apply route.    . cyclobenzaprine (FLEXERIL) 10 MG tablet cyclobenzaprine 10 mg tablet    . EPINEPHrine (EPIPEN 2-PAK) 0.3 mg/0.3 mL IJ SOAJ injection Inject 0.3 mLs (0.3 mg total) into the muscle once. 1 Device 0  . ibuprofen (ADVIL,MOTRIN) 800 MG tablet Take 1 tablet (800 mg total) by mouth every 8 (eight) hours as needed for moderate pain. 90 tablet 0  . meloxicam (MOBIC) 15 MG tablet meloxicam 15 mg tablet    . pantoprazole (PROTONIX) 40 MG tablet Take 40 mg by mouth daily.    . Prenat-Fe Carbonyl-FA-Omega 3 (ONE-A-DAY WOMENS PRENATAL 1 PO) Take 1 tablet by mouth daily.     No current facility-administered medications for this visit.     Allergies as of 08/24/2018 - Review Complete 08/24/2018  Allergen Reaction Noted  . Dust mite mixed allergen ext [mite (d. farinae)] Anaphylaxis  12/15/2017  . Other Anaphylaxis 12/15/2017    Vitals: BP 119/76 (BP Location: Left Arm, Patient Position: Sitting, Cuff Size: Normal)   Pulse 93   Ht 5\' 3"  (1.6 m)   Wt 206 lb (93.4 kg)   BMI 36.49 kg/m  Last Weight:  Wt Readings from Last 1 Encounters:  08/24/18 206 lb (93.4 kg)   Last Height:   Ht Readings from Last 1 Encounters:  08/24/18 5\' 3"  (1.6 m)   Physical exam: Exam: Gen: NAD, pleasant young African-American female, conversant, well nourised, obese, well groomed                     CV: RRR, no MRG. No Carotid Bruits. No peripheral edema, warm, nontender Eyes: Conjunctivae clear without exudates or hemorrhage  Neuro: Detailed Neurologic Exam  Speech:    Speech is normal; fluent and spontaneous with normal comprehension.  Cognition:    The patient is oriented to person, place, and time;     recent and remote memory intact;     language fluent;     normal attention, concentration,     fund of knowledge Cranial Nerves:    The pupils are equal, round, and reactive to light. The fundi are normal and spontaneous venous pulsations are present. Visual fields are full to finger confrontation. Extraocular movements are intact. Trigeminal sensation is intact and the muscles of mastication are normal. The face is symmetric. The palate elevates in the midline. Hearing intact. Voice is normal. Shoulder shrug is normal. The tongue has normal motion without fasciculations.   Coordination:    Normal finger to nose and heel to shin. Normal rapid alternating movements.   Gait:    Heel-toe and tandem gait are normal.   Motor Observation:    No asymmetry, no atrophy, and no involuntary movements noted. Tone:    Normal muscle tone.    Posture:    Posture is normal. normal erect    Strength:    Strength is V/V in the upper and lower limbs.      Sensation: intact to LT     Reflex Exam:  DTR's:  Deep tendon reflexes in the upper and lower extremities are normal  bilaterally.   Toes:    The toes are downgoing bilaterally.   Clonus:    Clonus is absent.       Assessment/Plan:  Sheri Malone is a 32 year old female that is followed in this office by Dr. Lucia Gaskins for migraine management with new complaints of cervical neck pain.  Migraines have been controlled both proximally 1 to 2/month but does endorse continuous neck/shoulder pain.  Recommend patient undergo physical therapy with dry needling for most likely cervicalgia as prior imaging negative and denies any traumatic event or injury.  Referred to Dr. Maura Crandall as recommended by Dr. Lucia Gaskins.  Also recommended to continue use of ibuprofen or Tylenol as needed for pain and moist heat along with proper posture especially while sitting at her computer at work and frequent neck stretches.  Advised patient to follow-up in the future if needed if she continues to experience his pain or if migraines worsen.    George Hugh, AGNP-BC  [facility] [address] [place], [place] [address]  Phone [phone] Fax [phone] Note: This document was prepared with digital dictation and possible smart phrase technology. Any transcriptional errors that result from this process are unintentional.

## 2018-08-24 ENCOUNTER — Ambulatory Visit: Payer: 59 | Admitting: Adult Health

## 2018-08-24 ENCOUNTER — Ambulatory Visit: Payer: 59 | Admitting: Neurology

## 2018-08-24 ENCOUNTER — Encounter: Payer: Self-pay | Admitting: Adult Health

## 2018-08-24 VITALS — BP 119/76 | HR 93 | Ht 63.0 in | Wt 206.0 lb

## 2018-08-24 DIAGNOSIS — M542 Cervicalgia: Secondary | ICD-10-CM | POA: Diagnosis not present

## 2018-08-24 DIAGNOSIS — G43009 Migraine without aura, not intractable, without status migrainosus: Secondary | ICD-10-CM

## 2018-08-24 NOTE — Patient Instructions (Signed)
Your Plan:  You will be called to schedule appointment for neck pain with dry needling and therapy by Dr. Maura Crandall who is located at [address]  Continue use of tylenol/iburprofen for pain along with moist heat and ice  Please let us know if your migraines worsen and we can consider medication treatment if needed   Follow up if needed      Thank you for coming to see [name] at [facility]. I hope we have been able to provide you high quality care today.  You may receive a patient satisfaction survey over the next few weeks. We would appreciate your feedback and comments so that we may continue to improve ourselves and the health of our patients.

## 2018-08-26 ENCOUNTER — Encounter: Payer: Self-pay | Admitting: Nurse Practitioner

## 2018-08-26 ENCOUNTER — Ambulatory Visit: Payer: 59 | Admitting: Nurse Practitioner

## 2018-08-26 VITALS — BP 120/76 | HR 92 | Temp 98.7°F | Resp 16 | Ht 63.0 in | Wt 205.1 lb

## 2018-08-26 DIAGNOSIS — R1084 Generalized abdominal pain: Secondary | ICD-10-CM | POA: Diagnosis not present

## 2018-08-26 DIAGNOSIS — R112 Nausea with vomiting, unspecified: Secondary | ICD-10-CM | POA: Diagnosis not present

## 2018-08-26 DIAGNOSIS — R197 Diarrhea, unspecified: Secondary | ICD-10-CM

## 2018-08-26 DIAGNOSIS — K8020 Calculus of gallbladder without cholecystitis without obstruction: Secondary | ICD-10-CM | POA: Diagnosis not present

## 2018-08-26 MED ORDER — FAMOTIDINE 20 MG PO TABS: 20.0000 mg | ORAL_TABLET | Freq: Two times a day (BID) | ORAL | 0 refills | Status: DC

## 2018-08-26 NOTE — Patient Instructions (Addendum)
-   Please call surgeon to see about moving up surgery date.  - take daily probiotic - Can take stool softener when not having daily stools- docusate sodium to prevent constipation.   Low-Fat Diet for Pancreatitis or Gallbladder Conditions A low-fat diet can be helpful if you have pancreatitis or a gallbladder condition. With these conditions, your pancreas and gallbladder have trouble digesting fats. A healthy eating plan with less fat will help rest your pancreas and gallbladder and reduce your symptoms. What do I need to know about this diet?  Eat a low-fat diet. ? Reduce your fat intake to less than 20-30% of your total daily calories. This is less than 50-60 g of fat per day. ? Remember that you need some fat in your diet. Ask your dietician what your daily goal should be. ? Choose nonfat and low-fat healthy foods. Look for the words "nonfat," "low fat," or "fat free." ? As a guide, look on the label and choose foods with less than 3 g of fat per serving. Eat only one serving.  Avoid alcohol.  Do not smoke. If you need help quitting, talk with your health care provider.  Eat small frequent meals instead of three large heavy meals. What foods can I eat? Grains Include healthy grains and starches such as potatoes, wheat bread, fiber-rich cereal, and brown rice. Choose whole grain options whenever possible. In adults, whole grains should account for 45-65% of your daily calories. Fruits and Vegetables Eat plenty of fruits and vegetables. Fresh fruits and vegetables add fiber to your diet. Meats and Other Protein Sources Eat lean meat such as chicken and pork. Trim any fat off of meat before cooking it. Eggs, fish, and beans are other sources of protein. In adults, these foods should account for 10-35% of your daily calories. Dairy Choose low-fat milk and dairy options. Dairy includes fat and protein, as well as calcium. Fats and Oils Limit high-fat foods such as fried foods, sweets,  baked goods, sugary drinks. Other Creamy sauces and condiments, such as mayonnaise, can add extra fat. Think about whether or not you need to use them, or use smaller amounts or low fat options. What foods are not recommended?  High fat foods, such as: ? [employer]. ? Ice cream. ? [place] toast. ? Sweet rolls. ? Pizza. ? Cheese bread. ? Foods covered with batter, butter, creamy sauces, or cheese. ? Fried foods. ? Sugary drinks and desserts.  Foods that cause gas or bloating This information is not intended to replace advice given to you by your health care provider. Make sure you discuss any questions you have with your health care provider. Document Released: 10/11/2013 Document Revised: 03/13/2016 Document Reviewed: 09/19/2013 Elsevier Interactive Patient Education  2017 [employer].

## 2018-08-26 NOTE — Progress Notes (Signed)
Name: Sheri Malone   MRN: [id number]    DOB: 24-Nov-1985   Date:08/26/2018       Progress Note  Subjective  Chief Complaint  Chief Complaint  Patient presents with  . GI Problem    HPI  When she got pregnant was having gall bladder issues- nausea, vomiting, diarrhea. Scheduled to have choleystectomy in December. Has been working to lose weight since- cut down on drinking sodas and sweet tea; eating healthier and controlling portions; increased walking but is having trouble losing weight and feels like abdomen is getting more distended. Over the last couple of weeks- nausea has gotten worse even drinking water makes her gag; hates to throw up so she tries to prevent this- thrown up three times in the last week. States she will have normal bowel movement for a few days then get  constipated for 3 days and then have large amounts diarrhea. Patient endorses upper abdominal cramping- depends on bowel movement usually worse after. Hasn't taken anything to help her with bowel movement- tried miralax in the past without help.  Last BM yesterday- was normal. No changes in diet- consistent meals. No increase in stress.   Wt Readings from Last 3 Encounters:  08/26/18 205 lb 1.6 oz (93 kg)  08/24/18 206 lb (93.4 kg)  06/08/18 197 lb (89.4 kg)   Reviewed ER notes an imaging from 10/14/2017  Patient Active Problem List   Diagnosis Date Noted  . Allergic rhinitis 11/23/2016  . Allergic conjunctivitis 01/17/2016  . Mild intermittent asthma 01/17/2016  . Dermatitis, eczematoid 06/21/2015  . H/O cesarean section 06/21/2015  . History of diabetes mellitus arising in pregnancy 06/21/2015  . Headache, migraine 06/21/2015    Past Medical History:  Diagnosis Date  . Abnormal Pap smear   . Asthma   . GERD (gastroesophageal reflux disease)   . Gestational HTN   . Headache(784.0)   . History of ectopic pregnancy   . History of gestational diabetes   . Hypertension   . Low hemoglobin    on ironspan   . Rhinitis   . Vaginal Pap smear, abnormal     Past Surgical History:  Procedure Laterality Date  . CESAREAN SECTION N/A 05/20/2013   Procedure: CESAREAN SECTION;  Surgeon: Genia Del, MD;  Location: [facility];  Service: Obstetrics;  Laterality: N/A;  primary  . CESAREAN SECTION N/A 02/04/2018   Procedure: Repeat CESAREAN SECTION;  Surgeon: Noland Fordyce, MD;  Location: [facility];  Service: Obstetrics;  Laterality: N/A;  EDD: 02/16/18  . DILATION AND CURETTAGE OF UTERUS  2009  . SALPINGOOPHORECTOMY  2009   left  . UNILATERAL SALPINGECTOMY Right 02/04/2018   Procedure: UNILATERAL SALPINGECTOMY;  Surgeon: Noland Fordyce, MD;  Location: [facility] BIRTHING SUITES;  Service: Obstetrics;  Laterality: Right;  . WISDOM TOOTH EXTRACTION      Social History   Tobacco Use  . Smoking status: Never Smoker  . Smokeless tobacco: Never Used  Substance Use Topics  . Alcohol use: No    Alcohol/week: 0.0 standard drinks    Frequency: Never     Current Outpatient Medications:  .  Albuterol Sulfate POWD, by Does not apply route., Disp: , Rfl:  .  cyclobenzaprine (FLEXERIL) 10 MG tablet, cyclobenzaprine 10 mg tablet, Disp: , Rfl:  .  EPINEPHrine (EPIPEN 2-PAK) 0.3 mg/0.3 mL IJ SOAJ injection, Inject 0.3 mLs (0.3 mg total) into the muscle once., Disp: 1 Device, Rfl: 0 .  ibuprofen (ADVIL,MOTRIN) 800 MG tablet, Take 1 tablet (800 mg  total) by mouth every 8 (eight) hours as needed for moderate pain., Disp: 90 tablet, Rfl: 0 .  pantoprazole (PROTONIX) 40 MG tablet, Take 40 mg by mouth daily., Disp: , Rfl:  .  Prenat-Fe Carbonyl-FA-Omega 3 (ONE-A-DAY WOMENS PRENATAL 1 PO), Take 1 tablet by mouth daily., Disp: , Rfl:  .  famotidine (PEPCID) 20 MG tablet, Take 1 tablet (20 mg total) by mouth 2 (two) times daily., Disp: 30 tablet, Rfl: 0 .  meloxicam (MOBIC) 15 MG tablet, meloxicam 15 mg tablet, Disp: , Rfl:   Allergies  Allergen Reactions  . Dust Mite Mixed Allergen Ext [Mite (D. Farinae)]  Anaphylaxis  . Other Anaphylaxis    DUST ROACH    Review of Systems  Constitutional: Negative for fever and malaise/fatigue.  HENT: Negative for congestion and sinus pain.   Eyes: Negative for blurred vision and double vision.  Respiratory: Negative for cough and shortness of breath.   Cardiovascular: Negative for chest pain and palpitations.  Gastrointestinal: Positive for abdominal pain, constipation, diarrhea, nausea and vomiting. Negative for blood in stool.  Genitourinary: Negative for dysuria and urgency.  Musculoskeletal: Negative for myalgias.  Skin: Negative for rash.  Neurological: Negative for dizziness and headaches.     No other specific complaints in a complete review of systems (except as listed in HPI above).  Objective  Vitals:   08/26/18 0937  BP: 120/76  Pulse: 92  Resp: 16  Temp: 98.7 F (37.1 C)  TempSrc: Oral  SpO2: 99%  Weight: 205 lb 1.6 oz (93 kg)  Height: 5\' 3"  (1.6 m)    Body mass index is 36.33 kg/m.  Nursing Note and Vital Signs reviewed.  Physical Exam  Constitutional: She is oriented to person, place, and time. She appears well-developed and well-nourished.  HENT:  Head: Normocephalic and atraumatic.  Mouth/Throat: Oropharynx is clear and moist. No oropharyngeal exudate.  Eyes: Pupils are equal, round, and reactive to light. Conjunctivae and EOM are normal.  Cardiovascular: Normal rate, regular rhythm, normal heart sounds and intact distal pulses.  Pulmonary/Chest: Effort normal and breath sounds normal.  Abdominal: Soft. Bowel sounds are normal. There is no hepatosplenomegaly. There is tenderness (mild epigastric RUQ tenderness). There is no rigidity, no CVA tenderness and negative Murphy's sign. No hernia (diastasis recti).  Neurological: She is alert and oriented to person, place, and time.  Skin: Skin is warm and dry. No erythema.  Psychiatric: She has a normal mood and affect. Her behavior is normal. Judgment and thought content  normal.     No results found for this or any previous visit (from the past 48 hour(s)).  Assessment & Plan  1. Non-intractable vomiting with nausea, unspecified vomiting type Discussed diet, moving up surgery date, taking probiotic and stool softener.  - CBC - COMPLETE METABOLIC PANEL WITH GFR - Lipase - famotidine (PEPCID) 20 MG tablet; Take 1 tablet (20 mg total) by mouth 2 (two) times daily.  Dispense: 30 tablet; Refill: 0  2. Diarrhea, unspecified type - CBC - COMPLETE METABOLIC PANEL WITH GFR - Lipase  3. Generalized abdominal pain - CBC - COMPLETE METABOLIC PANEL WITH GFR - Lipase - famotidine (PEPCID) 20 MG tablet; Take 1 tablet (20 mg total) by mouth 2 (two) times daily.  Dispense: 30 tablet; Refill: 0

## 2018-08-27 LAB — COMPLETE METABOLIC PANEL WITH GFR
AG Ratio: 1.7 (calc) (ref 1.0–2.5)
ALT: 9 U/L (ref 6–29)
AST: 10 U/L (ref 10–30)
Albumin: 3.9 g/dL (ref 3.6–5.1)
Alkaline phosphatase (APISO): 41 U/L (ref 33–115)
BUN: 11 mg/dL (ref 7–25)
CO2: 25 mmol/L (ref 20–32)
Calcium: 9 mg/dL (ref 8.6–10.2)
Chloride: 106 mmol/L (ref 98–110)
Creat: 0.82 mg/dL (ref 0.50–1.10)
GFR, Est African American: 108 mL/min/{1.73_m2} (ref >=60)
GFR, Est Non African American: 93 mL/min/{1.73_m2} (ref >=60)
Globulin: 2.3 g/dL (calc) (ref 1.9–3.7)
Glucose, Bld: 85 mg/dL (ref 65–139)
Potassium: 4 mmol/L (ref 3.5–5.3)
Sodium: 139 mmol/L (ref 135–146)
Total Bilirubin: 0.4 mg/dL (ref 0.2–1.2)
Total Protein: 6.2 g/dL (ref 6.1–8.1)

## 2018-08-27 LAB — CBC
HCT: 35.2 % (ref 35.0–45.0)
Hemoglobin: 11.3 g/dL — ABNORMAL LOW (ref 11.7–15.5)
MCH: 25.2 pg — ABNORMAL LOW (ref 27.0–33.0)
MCHC: 32.1 g/dL (ref 32.0–36.0)
MCV: 78.6 fL — ABNORMAL LOW (ref 80.0–100.0)
MPV: 9.9 fL (ref 7.5–12.5)
Platelets: 316 10*3/uL (ref 140–400)
RBC: 4.48 10*6/uL (ref 3.80–5.10)
RDW: 12.9 % (ref 11.0–15.0)
WBC: 8.3 10*3/uL (ref 3.8–10.8)

## 2018-08-27 LAB — LIPASE: Lipase: 27 U/L (ref 7–60)

## 2018-08-27 NOTE — Addendum Note (Signed)
Addended by: Cheryle Horsfall on: 08/27/2018 11:32 AM   Modules accepted: Orders

## 2018-08-27 NOTE — Addendum Note (Signed)
Addended by: Tommie Raymond on: 08/27/2018 09:45 AM   Modules accepted: Orders

## 2018-09-09 ENCOUNTER — Other Ambulatory Visit: Payer: Self-pay | Admitting: Surgery

## 2018-09-09 DIAGNOSIS — K8020 Calculus of gallbladder without cholecystitis without obstruction: Secondary | ICD-10-CM | POA: Diagnosis not present

## 2018-09-13 ENCOUNTER — Ambulatory Visit: Payer: 59 | Admitting: Family Medicine

## 2018-09-13 ENCOUNTER — Encounter: Payer: Self-pay | Admitting: Family Medicine

## 2018-09-13 VITALS — BP 118/68 | HR 103 | Temp 98.3°F | Ht 63.0 in | Wt 206.1 lb

## 2018-09-13 DIAGNOSIS — R059 Cough, unspecified: Secondary | ICD-10-CM

## 2018-09-13 DIAGNOSIS — J069 Acute upper respiratory infection, unspecified: Secondary | ICD-10-CM

## 2018-09-13 DIAGNOSIS — B9789 Other viral agents as the cause of diseases classified elsewhere: Secondary | ICD-10-CM | POA: Diagnosis not present

## 2018-09-13 DIAGNOSIS — J029 Acute pharyngitis, unspecified: Secondary | ICD-10-CM

## 2018-09-13 DIAGNOSIS — R05 Cough: Secondary | ICD-10-CM

## 2018-09-13 LAB — POCT RAPID STREP A (OFFICE): Rapid Strep A Screen: NEGATIVE

## 2018-09-13 NOTE — Patient Instructions (Addendum)
Try vitamin C (orange juice if not diabetic or vitamin C tablets) and drink green tea to help your immune system during your illness Get plenty of rest and hydration  We'll get the tests today   Sore Throat When you have a sore throat, your throat may:  Hurt.  Burn.  Feel irritated.  Feel scratchy.  Many things can cause a sore throat, including:  An infection.  Allergies.  Dryness in the air.  Smoke or pollution.  Gastroesophageal reflux disease (GERD).  A tumor.  A sore throat can be the first sign of another sickness. It can happen with other problems, like coughing or a fever. Most sore throats go away without treatment. Follow these instructions at home:  Take over-the-counter medicines only as told by your doctor.  Drink enough fluids to keep your pee (urine) clear or pale yellow.  Rest when you feel you need to.  To help with pain, try: ? Sipping warm liquids, such as broth, herbal tea, or warm water. ? Eating or drinking cold or frozen liquids, such as frozen ice pops. ? Gargling with a salt-water mixture 3-4 times a day or as needed. To make a salt-water mixture, add -1 tsp of salt in 1 cup of warm water. Mix it until you cannot see the salt anymore. ? Sucking on hard candy or throat lozenges. ? Putting a cool-mist humidifier in your bedroom at night. ? Sitting in the bathroom with the door closed for 5-10 minutes while you run hot water in the shower.  Do not use any tobacco products, such as cigarettes, chewing tobacco, and e-cigarettes. If you need help quitting, ask your doctor. Contact a doctor if:  You have a fever for more than 2-3 days.  You keep having symptoms for more than 2-3 days.  Your throat does not get better in 7 days.  You have a fever and your symptoms suddenly get worse. Get help right away if:  You have trouble breathing.  You cannot swallow fluids, soft foods, or your saliva.  You have swelling in your throat or neck  that gets worse.  You keep feeling like you are going to throw up (vomit).  You keep throwing up. This information is not intended to replace advice given to you by your health care provider. Make sure you discuss any questions you have with your health care provider. Document Released: 07/15/2008 Document Revised: 06/01/2016 Document Reviewed: 07/27/2015 Elsevier Interactive Patient Education  2018 Elsevier Inc.  Cough, Adult Coughing is a reflex that clears your throat and your airways. Coughing helps to heal and protect your lungs. It is normal to cough occasionally, but a cough that happens with other symptoms or lasts a long time may be a sign of a condition that needs treatment. A cough may last only 2-3 weeks (acute), or it may last longer than 8 weeks (chronic). What are the causes? Coughing is commonly caused by:  Breathing in substances that irritate your lungs.  A viral or bacterial respiratory infection.  Allergies.  Asthma.  Postnasal drip.  Smoking.  Acid backing up from the stomach into the esophagus (gastroesophageal reflux).  Certain medicines.  Chronic lung problems, including COPD (or rarely, lung cancer).  Other medical conditions such as heart failure.  Follow these instructions at home: Pay attention to any changes in your symptoms. Take these actions to help with your discomfort:  Take medicines only as told by your health care provider. ? If you were prescribed an antibiotic  medicine, take it as told by your health care provider. Do not stop taking the antibiotic even if you start to feel better. ? Talk with your health care provider before you take a cough suppressant medicine.  Drink enough fluid to keep your urine clear or pale yellow.  If the air is dry, use a cold steam vaporizer or humidifier in your bedroom or your home to help loosen secretions.  Avoid anything that causes you to cough at work or at home.  If your cough is worse at  night, try sleeping in a semi-upright position.  Avoid cigarette smoke. If you smoke, quit smoking. If you need help quitting, ask your health care provider.  Avoid caffeine.  Avoid alcohol.  Rest as needed.  Contact a health care provider if:  You have new symptoms.  You cough up pus.  Your cough does not get better after 2-3 weeks, or your cough gets worse.  You cannot control your cough with suppressant medicines and you are losing sleep.  You develop pain that is getting worse or pain that is not controlled with pain medicines.  You have a fever.  You have unexplained weight loss.  You have night sweats. Get help right away if:  You cough up blood.  You have difficulty breathing.  Your heartbeat is very fast. This information is not intended to replace advice given to you by your health care provider. Make sure you discuss any questions you have with your health care provider. Document Released: 04/04/2011 Document Revised: 03/13/2016 Document Reviewed: 12/13/2014 Elsevier Interactive Patient Education  2018 [employer].  Viral Respiratory Infection A viral respiratory infection is an illness that affects parts of the body used for breathing, like the lungs, nose, and throat. It is caused by a germ called a virus. Some examples of this kind of infection are:  A cold.  The flu (influenza).  A respiratory syncytial virus (RSV) infection.  How do I know if I have this infection? Most of the time this infection causes:  A stuffy or runny nose.  Yellow or green fluid in the nose.  A cough.  Sneezing.  Tiredness (fatigue).  Achy muscles.  A sore throat.  Sweating or chills.  A fever.  A headache.  How is this infection treated? If the flu is diagnosed early, it may be treated with an antiviral medicine. This medicine shortens the length of time a person has symptoms. Symptoms may be treated with over-the-counter and prescription medicines, such  as:  Expectorants. These make it easier to cough up mucus.  Decongestant nasal sprays.  Doctors do not prescribe antibiotic medicines for viral infections. They do not work with this kind of infection. How do I know if I should stay home? To keep others from getting sick, stay home if you have:  A fever.  A lasting cough.  A sore throat.  A runny nose.  Sneezing.  Muscles aches.  Headaches.  Tiredness.  Weakness.  Chills.  Sweating.  An upset stomach (nausea).  Follow these instructions at home:  Rest as much as possible.  Take over-the-counter and prescription medicines only as told by your doctor.  Drink enough fluid to keep your pee (urine) clear or pale yellow.  Gargle with salt water. Do this 3-4 times per day or as needed. To make a salt-water mixture, dissolve -1 tsp of salt in 1 cup of warm water. Make sure the salt dissolves all the way.  Use nose drops made from  salt water. This helps with stuffiness (congestion). It also helps soften the skin around your nose.  Do not drink alcohol.  Do not use tobacco products, including cigarettes, chewing tobacco, and e-cigarettes. If you need help quitting, ask your doctor. Get help if:  Your symptoms last for 10 days or longer.  Your symptoms get worse over time.  You have a fever.  You have very bad pain in your face or forehead.  Parts of your jaw or neck become very swollen. Get help right away if:  You feel pain or pressure in your chest.  You have shortness of breath.  You faint or feel like you will faint.  You keep throwing up (vomiting).  You feel confused. This information is not intended to replace advice given to you by your health care provider. Make sure you discuss any questions you have with your health care provider. Document Released: 09/18/2008 Document Revised: 03/13/2016 Document Reviewed: 03/14/2015 Elsevier Interactive Patient Education  2018 [employer]

## 2018-09-13 NOTE — Progress Notes (Signed)
BP 118/68   Pulse (!) 103   Temp 98.3 F (36.8 C) (Oral)   Ht 5\' 3"  (1.6 m)   Wt 206 lb 1.6 oz (93.5 kg)   LMP 08/25/2018   SpO2 98%   BMI 36.51 kg/m    Subjective:    Patient ID: Sheri Malone, female    DOB: July 07, 1986, 32 y.o.   MRN: [id number]  HPI: Sheri Malone is a 32 y.o. female  Chief Complaint  Patient presents with  . URI    onset 2 weeks symptoms include cough,sob and nasal congestion    HPI  Here for sick visit Nasal congestion Throat was sore last week Did have some soreness in the neck Others in the house have been sick and been to the doctor Diagnosed with sinus infections No known strep exposure She is breastfeeding  She has ibuprofen in her medicine list as well as meloxicam Ibuprofen rxd by Rica MastYvonne Nelson, muscle related headaches Not taking any meloxicam  [facility] 09/13/2018 05/17/2018 04/01/2017 11/14/2016 06/05/2016  Decreased Interest 0 0 0 0 0  Down, Depressed, Hopeless 0 0 0 0 0  PHQ - 2 Score 0 0 0 0 0  Altered sleeping 0 - - - -  Tired, decreased energy 0 - - - -  Change in appetite 0 - - - -  Feeling bad or failure about yourself  0 - - - -  Trouble concentrating 0 - - - -  Moving slowly or fidgety/restless 0 - - - -  Suicidal thoughts 0 - - - -  PHQ-9 Score 0 - - - -  Difficult doing work/chores Not difficult at all - - - -   Fall Risk  09/13/2018 08/26/2018 05/17/2018 04/01/2017 11/14/2016  Falls in the past year? 0 0 No No No  Number falls in past yr: 0 - - - -    Relevant past medical, surgical, family and social history reviewed Past Medical History:  Diagnosis Date  . Abnormal Pap smear   . Asthma   . GERD (gastroesophageal reflux disease)   . Gestational HTN   . Headache(784.0)   . History of ectopic pregnancy   . History of gestational diabetes   . Hypertension   . Low hemoglobin    on ironspan  . Rhinitis   . Vaginal Pap smear, abnormal    Past Surgical History:  Procedure Laterality Date  .  CESAREAN SECTION N/A 05/20/2013   Procedure: CESAREAN SECTION;  Surgeon: Genia DelMarie-Lyne Lavoie, MD;  Location: [facility];  Service: Obstetrics;  Laterality: N/A;  primary  . CESAREAN SECTION N/A 02/04/2018   Procedure: Repeat CESAREAN SECTION;  Surgeon: Noland FordyceFogleman, Kelly, MD;  [facility];  Service: Obstetrics;  Laterality: N/A;  EDD: 02/16/18  . DILATION AND CURETTAGE OF UTERUS  2009  . SALPINGOOPHORECTOMY  2009   left  . UNILATERAL SALPINGECTOMY Right 02/04/2018   Procedure: UNILATERAL SALPINGECTOMY;  Surgeon: Noland FordyceFogleman, Kelly, MD;  [facility];  Service: Obstetrics;  Laterality: Right;  . WISDOM TOOTH EXTRACTION     Family History  Problem Relation Age of Onset  . Diabetes Father   . Hypertension Father   . Cancer Maternal Grandfather        prostate  . Heart disease Paternal Grandfather   . Migraines Mother   . Hypertension Mother   . Breast cancer Mother   . Multiple sclerosis Sister    Social History   Tobacco Use  . Smoking status: Never  Smoker  . Smokeless tobacco: Never Used  Substance Use Topics  . Alcohol use: No    Alcohol/week: 0.0 standard drinks    Frequency: Never  . Drug use: No     [facility] Visit from 09/13/2018 in [facility]  AUDIT-C Score  0      Interim medical history since last visit reviewed. Allergies and medications reviewed  Review of Systems Per HPI unless specifically indicated above     Objective:    BP 118/68   Pulse (!) 103   Temp 98.3 F (36.8 C) (Oral)   Ht 5\' 3"  (1.6 m)   Wt 206 lb 1.6 oz (93.5 kg)   LMP 08/25/2018   SpO2 98%   BMI 36.51 kg/m   Wt Readings from Last 3 Encounters:  09/13/18 206 lb 1.6 oz (93.5 kg)  08/26/18 205 lb 1.6 oz (93 kg)  08/24/18 206 lb (93.4 kg)    Physical Exam  Constitutional: She appears well-developed and well-nourished.  HENT:  Right Ear: Tympanic membrane and ear canal normal.  Left Ear: Tympanic membrane and ear canal normal.  Nose: Rhinorrhea  (clear) present. No mucosal edema.  Mouth/Throat: Mucous membranes are normal. Posterior oropharyngeal erythema (mild posterior erythema) present. No oropharyngeal exudate or posterior oropharyngeal edema.  Eyes: EOM are normal. No scleral icterus.  Cardiovascular: Regular rhythm. Tachycardia present.  Rate is borderline tachycardic  Pulmonary/Chest: Effort normal and breath sounds normal.  Lymphadenopathy:    She has no cervical adenopathy.  Skin: No rash noted. She is not diaphoretic. No pallor.  Psychiatric: She has a normal mood and affect.    Results for orders placed or performed in visit on 09/13/18  POCT rapid strep A  Result Value Ref Range   Rapid Strep A Screen Negative Negative      Assessment & Plan:   Problem List Items Addressed This Visit    None    Visit Diagnoses    Pharyngitis, unspecified etiology    -  Primary   I believe this is viral, rapid strep negative, culture pending; supportive care   Relevant Orders   POCT rapid strep A (Completed)   Culture, Group A Strep   Cough       lungs are clear; no indication today for CXR; explained if her cough worsens, she develops fever, chest pain, etc, anythingn like PNA, then seek attention   Viral URI with cough       likely viral; antibiotics not indicated at this time; supportive care; see AVS       Follow up plan: No follow-ups on file.  An after-visit summary was printed and given to the patient at check-out.  Please see the patient instructions which may contain other information and recommendations beyond what is mentioned above in the assessment and plan.  No orders of the defined types were placed in this encounter.   Orders Placed This Encounter  Procedures  . Culture, Group A Strep  . POCT rapid strep A

## 2018-09-15 LAB — CULTURE, GROUP A STREP
MICRO NUMBER:: 91419525
SPECIMEN QUALITY:: ADEQUATE

## 2018-09-15 NOTE — Progress Notes (Signed)
Sheri BradfordKimberly, please let the patient know that her throat culture was negative for strep; we hope she is feeling better soon

## 2018-09-21 NOTE — Pre-Procedure Instructions (Signed)
Sheri Malone  09/21/2018      [id number] - Sheri Malone, [place] - [address] [name] [address] Phone: [phone] Fax: [phone]    Your procedure is scheduled on 09/28/2018.  [facility] at [facility] A.M.  Call this number if you have problems the morning of surgery:  [phone]   Remember:  Do not eat after midnight the night before your surgery.  You may drink clear liquids until 0600 .  Clear liquids allowed are: Water, Juice (non-citric and without pulp), Carbonated beverages, Clear Tea, Black Coffee only and Gatorade    Take these medicines the morning of surgery with A SIP OF WATER: Albuterol (Proventil/Ventolin) inhaler - if needed (Bring with you to the hospital) Cyclobenzaprine (Flexeril) - if needed Pantoprazole (Protonix)  7 days prior to surgery STOP taking any Aspirin(unless otherwise instructed by your surgeon), Aleve, Naproxen, Ibuprofen, Motrin, Advil, Goody's, BC's, all herbal medications, fish oil, and all vitamins     Do not wear jewelry, make-up or nail polish.  Do not wear lotions, powders, or perfumes, or deodorant.  Do not shave 48 hours prior to surgery.    Do not bring valuables to the hospital.  [facility] is not responsible for any belongings or valuables.  Contacts, eyeglasses, hearing aids, dentures or bridgework may not be worn into surgery.  Leave your suitcase in the car.  After surgery it may be brought to your room.  For patients admitted to the hospital, discharge time will be determined by your treatment team.  Patients discharged the day of surgery will not be allowed to drive home.   Name and phone number of your driver:    Special instructions:   [place]- Preparing For Surgery  Before surgery, you can play an important role. Because skin is not sterile, your skin needs to be as free of germs as possible. You can reduce the number of germs on your skin by  washing with CHG (chlorahexidine gluconate) Soap before surgery.  CHG is an antiseptic cleaner which kills germs and bonds with the skin to continue killing germs even after washing.    Oral Hygiene is also important to reduce your risk of infection.  Remember - BRUSH YOUR TEETH THE MORNING OF SURGERY WITH YOUR REGULAR TOOTHPASTE  Please do not use if you have an allergy to CHG or antibacterial soaps. If your skin becomes reddened/irritated stop using the CHG.  Do not shave (including legs and underarms) for at least 48 hours prior to first CHG shower. It is OK to shave your face.  Please follow these instructions carefully.   1. Shower the NIGHT BEFORE SURGERY and the MORNING OF SURGERY with CHG.   2. If you chose to wash your hair, wash your hair first as usual with your normal shampoo.  3. After you shampoo, rinse your hair and body thoroughly to remove the shampoo.  4. Use CHG as you would any other liquid soap. You can apply CHG directly to the skin and wash gently with a scrungie or a clean washcloth.   5. Apply the CHG Soap to your body ONLY FROM THE NECK DOWN.  Do not use on open wounds or open sores. Avoid contact with your eyes, ears, mouth and genitals (private parts). Wash Face and genitals (private parts)  with your normal soap.  6. Wash thoroughly, paying special attention to the area where your surgery will be performed.  7. Thoroughly  rinse your body with warm water from the neck down.  8. DO NOT shower/wash with your normal soap after using and rinsing off the CHG Soap.  9. Pat yourself dry with a CLEAN TOWEL.  10. Wear CLEAN PAJAMAS to bed the night before surgery, wear comfortable clothes the morning of surgery  11. Place CLEAN SHEETS on your bed the night of your first shower and DO NOT SLEEP WITH PETS.    Day of Surgery: Shower as stated above. Do not apply any deodorants/lotions.  Please wear clean clothes to the hospital/surgery center.   Remember to brush  your teeth WITH YOUR REGULAR TOOTHPASTE.    Please read over the following fact sheets that you were given. Coughing and Deep Breathing and Surgical Site Infection Prevention

## 2018-09-22 ENCOUNTER — Encounter (HOSPITAL_COMMUNITY)
Admission: RE | Admit: 2018-09-22 | Discharge: 2018-09-22 | Disposition: A | Discharge status: Home / Self Care | Payer: 59 | Source: Ambulatory Visit | Attending: Surgery | Admitting: Surgery

## 2018-09-22 ENCOUNTER — Other Ambulatory Visit: Payer: Self-pay

## 2018-09-22 ENCOUNTER — Encounter (HOSPITAL_COMMUNITY): Payer: Self-pay

## 2018-09-22 DIAGNOSIS — R197 Diarrhea, unspecified: Secondary | ICD-10-CM | POA: Diagnosis not present

## 2018-09-22 DIAGNOSIS — R112 Nausea with vomiting, unspecified: Secondary | ICD-10-CM | POA: Insufficient documentation

## 2018-09-22 DIAGNOSIS — Z01818 Encounter for other preprocedural examination: Secondary | ICD-10-CM | POA: Insufficient documentation

## 2018-09-22 LAB — CBC
HCT: 40.2 % (ref 36.0–46.0)
Hemoglobin: 12.1 g/dL (ref 12.0–15.0)
MCH: 24.5 pg — ABNORMAL LOW (ref 26.0–34.0)
MCHC: 30.1 g/dL (ref 30.0–36.0)
MCV: 81.5 fL (ref 80.0–100.0)
Platelets: 325 10*3/uL (ref 150–400)
RBC: 4.93 MIL/uL (ref 3.87–5.11)
RDW: 13.1 % (ref 11.5–15.5)
WBC: 9.9 10*3/uL (ref 4.0–10.5)
nRBC: 0 % (ref 0.0–0.2)

## 2018-09-22 NOTE — Progress Notes (Signed)
PCP - Dr. Sherie DonLada Cardiologist - patient denies  Chest x-ray - n/a EKG - n/a Stress Test - 2009; patient states it was negative and she has not had to have any follow-up ECHO - patient denies Cardiac Cath - patient denies  Sleep Study - patient denies, negative stop bang   Anesthesia review: n/a  Patient denies shortness of breath, fever, cough and chest pain at PAT appointment   Patient verbalized understanding of instructions that were given to them at the PAT appointment. Patient was also instructed that they will need to review over the PAT instructions again at home before surgery.

## 2018-09-27 NOTE — H&P (Signed)
Sheri GuttingShvawn P Malone  [facility] Patient #: [id number] DOB: 02-06-1986 Married / Language: English / Race: Black or African American Female   History of Present Illness ( The patient is a 32 year old female who presents for evaluation of gall stones. This is a pleasant patient referred by Dr. Charna ElizabethJyothi Mann for the evaluation of symptomatic cholelithiasis. She just had a baby by C-section in December started having attacks then of cholelithiasis. She describes right upper quadrant abdominal pain with nausea and vomiting after fatty meals. She has been improving on a low-fat diet but still has occasional discomfort. She describes the pain is moderate and sharp. It does not refer anywhere else. Bowel movements have been normal. She is otherwise healthy without complaints. She reports she is healing well from a C-section.   Past Surgical History Oral Surgery   Diagnostic Studies History  Colonoscopy  never  Allergies  No Known Drug Allergies [03/04/2018]: Allergies Reconciled   Medication History  Ferralet 90 (90-1MG  Tablet, Oral) Active. Ibuprofen (600MG  Tablet, Oral) Active. Pantoprazole Sodium (40MG  Tablet DR, Oral) Active. Prenatal (Oral) Active. Medications Reconciled  Family History ( Breast Cancer  Mother. Diabetes Mellitus  Father. Hypertension  Father, Mother.  Other Problems Cholelithiasis  Gastroesophageal Reflux Disease  Migraine Headache     Review of Systems  General Not Present- Appetite Loss, Chills, Fatigue, Fever, Night Sweats, Weight Gain and Weight Loss. Skin Not Present- Change in Wart/Mole, Dryness, Hives, Jaundice, New Lesions, Non-Healing Wounds, Rash and Ulcer. HEENT Present- Wears glasses/contact lenses. Not Present- Earache, Hearing Loss, Hoarseness, Nose Bleed, Oral Ulcers, Ringing in the Ears, Seasonal Allergies, Sinus Pain, Sore Throat, Visual Disturbances and Yellow Eyes. Respiratory Present- Snoring. Not  Present- Bloody sputum, Chronic Cough, Difficulty Breathing and Wheezing. Breast Not Present- Breast Mass, Breast Pain, Nipple Discharge and Skin Changes. Cardiovascular Not Present- Chest Pain, Difficulty Breathing Lying Down, Leg Cramps, Palpitations, Rapid Heart Rate, Shortness of Breath and Swelling of Extremities. Gastrointestinal Present- Abdominal Pain and Gets full quickly at meals. Not Present- Bloating, Bloody Stool, Change in Bowel Habits, Chronic diarrhea, Constipation, Difficulty Swallowing, Excessive gas, Hemorrhoids, Indigestion, Nausea, Rectal Pain and Vomiting. Neurological Present- Headaches. Not Present- Decreased Memory, Fainting, Numbness, Seizures, Tingling, Tremor, Trouble walking and Weakness. Psychiatric Not Present- Anxiety, Bipolar, Change in Sleep Pattern, Depression, Fearful and Frequent crying. Endocrine Present- Hot flashes. Not Present- Cold Intolerance, Excessive Hunger, Hair Changes, Heat Intolerance and New Diabetes. Hematology Not Present- Blood Thinners, Easy Bruising, Excessive bleeding, Gland problems, HIV and Persistent Infections.  Vitals  Weight: 194 lb Height: 63in Body Surface Area: 1.91 m Body Mass Index: 34.37 kg/m  Temp.: 98.37F  Pulse: 91 (Regular)  BP: 122/74 (Sitting, Left Arm, Standard)    Physical Exam General Mental Status-Alert. General Appearance-Consistent with stated age. Hydration-Well hydrated. Voice-Normal.  Head and Neck Head-normocephalic, atraumatic with no lesions or palpable masses.  Eye Eyeball - Bilateral-Extraocular movements intact. Sclera/Conjunctiva - Bilateral-No scleral icterus.  Chest and Lung Exam Chest and lung exam reveals -quiet, even and easy respiratory effort with no use of accessory muscles and on auscultation, normal breath sounds, no adventitious sounds and normal vocal resonance. Inspection Chest Wall - Normal. Back - normal.  Cardiovascular Cardiovascular  examination reveals -on palpation PMI is normal in location and amplitude, no palpable S3 or S4. Normal cardiac borders., normal heart sounds, regular rate and rhythm with no murmurs, carotid auscultation reveals no bruits and normal pedal pulses bilaterally.  Abdomen Inspection Inspection of the abdomen reveals - No  Hernias. Skin - Scar - no surgical scars. Palpation/Percussion Palpation and Percussion of the abdomen reveal - Soft, Non Tender, No Rebound tenderness, No Rigidity (guarding) and No hepatosplenomegaly. Auscultation Auscultation of the abdomen reveals - Bowel sounds normal.  Neurologic - Did not examine.  Musculoskeletal - Did not examine.    Assessment & Plan  SYMPTOMATIC CHOLELITHIASIS  Impression: I discussed the diagnosis of gallstones with the patient. I reviewed her ultrasound. Laparoscopic cholecystectomy is recommended. We discussed reasons for this. I gave her literature regarding gallstones and surgery. We discussed the risks of surgery which includes but is not limited to bleeding, infection, bile duct injury, bile leak, injury to other structures, the need to convert to an open incision, cardiopulmonary issues, postoperative recovery, etc. She understands and wished to proceed with surgery which will be scheduled

## 2018-09-28 ENCOUNTER — Ambulatory Visit (HOSPITAL_COMMUNITY): Payer: 59

## 2018-09-28 ENCOUNTER — Encounter (HOSPITAL_COMMUNITY): Payer: Self-pay | Admitting: *Deleted

## 2018-09-28 ENCOUNTER — Encounter (HOSPITAL_COMMUNITY)
Admission: RE | Disposition: A | Discharge status: Home / Self Care | Payer: Self-pay | Source: Ambulatory Visit | Attending: Surgery

## 2018-09-28 ENCOUNTER — Ambulatory Visit (HOSPITAL_COMMUNITY)
Admission: RE | Admit: 2018-09-28 | Discharge: 2018-09-28 | Disposition: A | Discharge status: Home / Self Care | Payer: 59 | Source: Ambulatory Visit | Attending: Surgery | Admitting: Surgery

## 2018-09-28 DIAGNOSIS — I10 Essential (primary) hypertension: Secondary | ICD-10-CM | POA: Insufficient documentation

## 2018-09-28 DIAGNOSIS — K8020 Calculus of gallbladder without cholecystitis without obstruction: Secondary | ICD-10-CM | POA: Diagnosis present

## 2018-09-28 DIAGNOSIS — K829 Disease of gallbladder, unspecified: Secondary | ICD-10-CM | POA: Diagnosis not present

## 2018-09-28 DIAGNOSIS — K219 Gastro-esophageal reflux disease without esophagitis: Secondary | ICD-10-CM | POA: Diagnosis not present

## 2018-09-28 DIAGNOSIS — K8010 Calculus of gallbladder with chronic cholecystitis without obstruction: Secondary | ICD-10-CM | POA: Insufficient documentation

## 2018-09-28 DIAGNOSIS — Z791 Long term (current) use of non-steroidal anti-inflammatories (NSAID): Secondary | ICD-10-CM | POA: Diagnosis not present

## 2018-09-28 DIAGNOSIS — Z79899 Other long term (current) drug therapy: Secondary | ICD-10-CM | POA: Insufficient documentation

## 2018-09-28 HISTORY — PX: CHOLECYSTECTOMY: SHX55

## 2018-09-28 LAB — POCT PREGNANCY, URINE: Preg Test, Ur: NEGATIVE

## 2018-09-28 SURGERY — LAPAROSCOPIC CHOLECYSTECTOMY
Anesthesia: General | Site: Abdomen

## 2018-09-28 MED ORDER — ROCURONIUM BROMIDE 50 MG/5ML IV SOSY
PREFILLED_SYRINGE | INTRAVENOUS | Status: AC
  Filled 2018-09-28: qty 15

## 2018-09-28 MED ORDER — MIDAZOLAM HCL 5 MG/5ML IJ SOLN
INTRAMUSCULAR | Status: DC | PRN
  Administered 2018-09-28: 2 mg via INTRAVENOUS

## 2018-09-28 MED ORDER — STERILE WATER FOR IRRIGATION IR SOLN
Status: DC | PRN
  Administered 2018-09-28: 200 mL

## 2018-09-28 MED ORDER — LACTATED RINGERS IV SOLN
INTRAVENOUS | Status: DC | PRN
  Administered 2018-09-28 (×3): via INTRAVENOUS

## 2018-09-28 MED ORDER — ONDANSETRON HCL 4 MG/2ML IJ SOLN
INTRAMUSCULAR | Status: DC | PRN
  Administered 2018-09-28: 4 mg via INTRAVENOUS

## 2018-09-28 MED ORDER — PROPOFOL 10 MG/ML IV BOLUS
INTRAVENOUS | Status: DC | PRN
  Administered 2018-09-28: 150 mg via INTRAVENOUS

## 2018-09-28 MED ORDER — HYDROMORPHONE HCL 1 MG/ML IJ SOLN
INTRAMUSCULAR | Status: AC
  Filled 2018-09-28: qty 1

## 2018-09-28 MED ORDER — KETOROLAC TROMETHAMINE 30 MG/ML IJ SOLN
30.0000 mg | Freq: Once | INTRAMUSCULAR | Status: AC | PRN
  Administered 2018-09-28: 30 mg via INTRAVENOUS

## 2018-09-28 MED ORDER — BUPIVACAINE-EPINEPHRINE (PF) 0.25% -1:200000 IJ SOLN
INTRAMUSCULAR | Status: AC
  Filled 2018-09-28: qty 30

## 2018-09-28 MED ORDER — BUPIVACAINE-EPINEPHRINE 0.25% -1:200000 IJ SOLN
INTRAMUSCULAR | Status: DC | PRN
  Administered 2018-09-28: 10 mL

## 2018-09-28 MED ORDER — CEFAZOLIN SODIUM-DEXTROSE 2-4 GM/100ML-% IV SOLN
2.0000 g | INTRAVENOUS | Status: AC
  Administered 2018-09-28: 2 g via INTRAVENOUS
  Filled 2018-09-28: qty 100

## 2018-09-28 MED ORDER — ROCURONIUM BROMIDE 50 MG/5ML IV SOSY
PREFILLED_SYRINGE | INTRAVENOUS | Status: DC | PRN
  Administered 2018-09-28: 50 mg via INTRAVENOUS

## 2018-09-28 MED ORDER — PROPOFOL 10 MG/ML IV BOLUS
INTRAVENOUS | Status: AC
  Filled 2018-09-28: qty 20

## 2018-09-28 MED ORDER — HYDROMORPHONE HCL 1 MG/ML IJ SOLN
INTRAMUSCULAR | Status: AC
  Administered 2018-09-28: 0.5 mg via INTRAVENOUS
  Filled 2018-09-28: qty 1

## 2018-09-28 MED ORDER — 0.9 % SODIUM CHLORIDE (POUR BTL) OPTIME
TOPICAL | Status: DC | PRN
  Administered 2018-09-28: 1000 mL

## 2018-09-28 MED ORDER — CELECOXIB 200 MG PO CAPS: 200.0000 mg | ORAL_CAPSULE | ORAL | Status: DC

## 2018-09-28 MED ORDER — GABAPENTIN 300 MG PO CAPS: 300.0000 mg | ORAL_CAPSULE | ORAL | Status: DC

## 2018-09-28 MED ORDER — OXYCODONE HCL 5 MG/5ML PO SOLN: 5.0000 mg | Freq: Once | ORAL | Status: DC | PRN

## 2018-09-28 MED ORDER — CELECOXIB 200 MG PO CAPS
ORAL_CAPSULE | ORAL | Status: AC
  Administered 2018-09-28: 200 mg
  Filled 2018-09-28: qty 1

## 2018-09-28 MED ORDER — ACETAMINOPHEN 500 MG PO TABS
1000.0000 mg | ORAL_TABLET | ORAL | Status: AC
  Administered 2018-09-28: 1000 mg via ORAL
  Filled 2018-09-28: qty 2

## 2018-09-28 MED ORDER — OXYCODONE HCL 5 MG PO TABS: 5.0000 mg | ORAL_TABLET | Freq: Four times a day (QID) | ORAL | 0 refills | Status: DC | PRN

## 2018-09-28 MED ORDER — SODIUM CHLORIDE 0.9 % IR SOLN
Status: DC | PRN
  Administered 2018-09-28: 1000 mL

## 2018-09-28 MED ORDER — LIDOCAINE 2% (20 MG/ML) 5 ML SYRINGE
INTRAMUSCULAR | Status: AC
  Filled 2018-09-28: qty 15

## 2018-09-28 MED ORDER — KETOROLAC TROMETHAMINE 30 MG/ML IJ SOLN
INTRAMUSCULAR | Status: AC
  Filled 2018-09-28: qty 1

## 2018-09-28 MED ORDER — CHLORHEXIDINE GLUCONATE CLOTH 2 % EX PADS: 6.0000 | MEDICATED_PAD | Freq: Once | CUTANEOUS | Status: DC

## 2018-09-28 MED ORDER — OXYCODONE HCL 5 MG PO TABS: 5.0000 mg | ORAL_TABLET | Freq: Once | ORAL | Status: DC | PRN

## 2018-09-28 MED ORDER — LIDOCAINE 2% (20 MG/ML) 5 ML SYRINGE
INTRAMUSCULAR | Status: DC | PRN
  Administered 2018-09-28: 80 mg via INTRAVENOUS

## 2018-09-28 MED ORDER — FENTANYL CITRATE (PF) 100 MCG/2ML IJ SOLN
INTRAMUSCULAR | Status: DC | PRN
  Administered 2018-09-28 (×4): 50 ug via INTRAVENOUS

## 2018-09-28 MED ORDER — FENTANYL CITRATE (PF) 250 MCG/5ML IJ SOLN
INTRAMUSCULAR | Status: AC
  Filled 2018-09-28: qty 5

## 2018-09-28 MED ORDER — DEXAMETHASONE SODIUM PHOSPHATE 10 MG/ML IJ SOLN
INTRAMUSCULAR | Status: AC
  Filled 2018-09-28: qty 3

## 2018-09-28 MED ORDER — HYDROMORPHONE HCL 1 MG/ML IJ SOLN
0.2500 mg | INTRAMUSCULAR | Status: DC | PRN
  Administered 2018-09-28 (×3): 0.5 mg via INTRAVENOUS

## 2018-09-28 MED ORDER — MIDAZOLAM HCL 2 MG/2ML IJ SOLN
INTRAMUSCULAR | Status: AC
  Filled 2018-09-28: qty 2

## 2018-09-28 MED ORDER — GABAPENTIN 300 MG PO CAPS
ORAL_CAPSULE | ORAL | Status: AC
  Administered 2018-09-28: 300 mg
  Filled 2018-09-28: qty 1

## 2018-09-28 MED ORDER — PHENYLEPHRINE HCL 10 MG/ML IJ SOLN
INTRAMUSCULAR | Status: DC | PRN
  Administered 2018-09-28: 80 ug via INTRAVENOUS

## 2018-09-28 MED ORDER — SUGAMMADEX SODIUM 200 MG/2ML IV SOLN
INTRAVENOUS | Status: DC | PRN
  Administered 2018-09-28: 400 mg via INTRAVENOUS

## 2018-09-28 MED ORDER — LACTATED RINGERS IV SOLN
INTRAVENOUS | Status: DC
  Administered 2018-09-28: 08:00:00 via INTRAVENOUS

## 2018-09-28 MED ORDER — ONDANSETRON HCL 4 MG/2ML IJ SOLN
INTRAMUSCULAR | Status: AC
  Filled 2018-09-28: qty 6

## 2018-09-28 SURGICAL SUPPLY — 35 items
APPLIER CLIP 5 13 M/L LIGAMAX5 (MISCELLANEOUS) ×2
CANISTER SUCT 3000ML PPV (MISCELLANEOUS) ×2 IMPLANT
CHLORAPREP W/TINT 26ML (MISCELLANEOUS) ×2 IMPLANT
CLIP APPLIE 5 13 M/L LIGAMAX5 (MISCELLANEOUS) ×1 IMPLANT
COVER SURGICAL LIGHT HANDLE (MISCELLANEOUS) ×2 IMPLANT
COVER WAND RF STERILE (DRAPES) ×2 IMPLANT
DERMABOND ADVANCED (GAUZE/BANDAGES/DRESSINGS) ×1
DERMABOND ADVANCED .7 DNX12 (GAUZE/BANDAGES/DRESSINGS) ×1 IMPLANT
ELECT REM PT RETURN 9FT ADLT (ELECTROSURGICAL) ×2
ELECTRODE REM PT RTRN 9FT ADLT (ELECTROSURGICAL) ×1 IMPLANT
GLOVE BIO SURGEON STRL SZ7.5 (GLOVE) ×2 IMPLANT
GLOVE BIOGEL PI IND STRL 7.5 (GLOVE) ×1 IMPLANT
GLOVE BIOGEL PI INDICATOR 7.5 (GLOVE) ×1
GLOVE SURG SIGNA 7.5 PF LTX (GLOVE) ×2 IMPLANT
GOWN STRL REUS W/ TWL LRG LVL3 (GOWN DISPOSABLE) ×2 IMPLANT
GOWN STRL REUS W/ TWL XL LVL3 (GOWN DISPOSABLE) ×1 IMPLANT
GOWN STRL REUS W/TWL LRG LVL3 (GOWN DISPOSABLE) ×2
GOWN STRL REUS W/TWL XL LVL3 (GOWN DISPOSABLE) ×1
KIT BASIN OR (CUSTOM PROCEDURE TRAY) ×2 IMPLANT
KIT TURNOVER KIT B (KITS) ×2 IMPLANT
NS IRRIG 1000ML POUR BTL (IV SOLUTION) ×2 IMPLANT
PAD ARMBOARD 7.5X6 YLW CONV (MISCELLANEOUS) ×2 IMPLANT
POUCH SPECIMEN RETRIEVAL 10MM (ENDOMECHANICALS) ×2 IMPLANT
SCISSORS LAP 5X35 DISP (ENDOMECHANICALS) ×2 IMPLANT
SET IRRIG TUBING LAPAROSCOPIC (IRRIGATION / IRRIGATOR) ×2 IMPLANT
SLEEVE ENDOPATH XCEL 5M (ENDOMECHANICALS) ×4 IMPLANT
SPECIMEN JAR SMALL (MISCELLANEOUS) ×2 IMPLANT
SUT MNCRL AB 4-0 PS2 18 (SUTURE) ×2 IMPLANT
TOWEL OR 17X24 6PK STRL BLUE (TOWEL DISPOSABLE) ×2 IMPLANT
TOWEL OR 17X26 10 PK STRL BLUE (TOWEL DISPOSABLE) ×2 IMPLANT
TRAY LAPAROSCOPIC MC (CUSTOM PROCEDURE TRAY) ×2 IMPLANT
TROCAR XCEL BLUNT TIP 100MML (ENDOMECHANICALS) ×2 IMPLANT
TROCAR XCEL NON-BLD 5MMX100MML (ENDOMECHANICALS) ×2 IMPLANT
TUBING INSUFFLATION (TUBING) ×2 IMPLANT
WATER STERILE IRR 1000ML POUR (IV SOLUTION) ×2 IMPLANT

## 2018-09-28 NOTE — Transfer of Care (Signed)
Immediate Anesthesia Transfer of Care Note  Patient: Sheri Malone  Procedure(s) Performed: LAPAROSCOPIC CHOLECYSTECTOMY (N/A Abdomen)  Patient Location: PACU  Anesthesia Type:General  Level of Consciousness: awake, alert  and oriented  Airway & Oxygen Therapy: Patient Spontanous Breathing and Patient connected to nasal cannula oxygen  Post-op Assessment: Report given to RN, Post -op Vital signs reviewed and stable and Patient moving all extremities X 4  Post vital signs: Reviewed and stable  Last Vitals:  Vitals Value Taken Time  BP    Temp    Pulse 95 09/28/2018  9:48 AM  Resp    SpO2 98 % 09/28/2018  9:48 AM  Vitals shown include unvalidated device data.  Last Pain:  Vitals:   09/28/18 0732  TempSrc:   PainSc: 0-No pain      Patients Stated Pain Goal: 7 (09/28/18 0732)  Complications: No apparent anesthesia complications

## 2018-09-28 NOTE — Anesthesia Postprocedure Evaluation (Signed)
Anesthesia Post Note  Patient: Sheri Malone  Procedure(s) Performed: LAPAROSCOPIC CHOLECYSTECTOMY (N/A Abdomen)     Patient location during evaluation: PACU Anesthesia Type: General Level of consciousness: awake and alert Pain management: pain level controlled Vital Signs Assessment: post-procedure vital signs reviewed and stable Respiratory status: spontaneous breathing, nonlabored ventilation, respiratory function stable and patient connected to nasal cannula oxygen Cardiovascular status: blood pressure returned to baseline and stable Postop Assessment: no apparent nausea or vomiting Anesthetic complications: no    Last Vitals:  Vitals:   09/28/18 1004 09/28/18 1019  BP: 127/89 118/78  Pulse: 81 86  Resp: 10 (!) 7  Temp:    SpO2: 100% 94%    Last Pain:  Vitals:   09/28/18 1025  TempSrc:   PainSc: 6                  Kynzlee Hucker S

## 2018-09-28 NOTE — Interval H&P Note (Signed)
History and Physical Interval Note:no change in H and P 09/28/2018 8:27 AM  Sheri Malone  has presented today for surgery, with the diagnosis of SYMPTOMATIC GALLSTONES  The various methods of treatment have been discussed with the patient and family. After consideration of risks, benefits and other options for treatment, the patient has consented to  Procedure(s): LAPAROSCOPIC CHOLECYSTECTOMY (N/A) as a surgical intervention .  The patient's history has been reviewed, patient examined, no change in status, stable for surgery.  I have reviewed the patient's chart and labs.  Questions were answered to the patient's satisfaction.     [name] A

## 2018-09-28 NOTE — Anesthesia Procedure Notes (Signed)
Procedure Name: Intubation Date/Time: 09/28/2018 8:57 AM Performed by: Neldon Newport, CRNA Pre-anesthesia Checklist: Patient identified, Emergency Drugs available, Suction available and Patient being monitored Patient Re-evaluated:Patient Re-evaluated prior to induction Oxygen Delivery Method: Circle System Utilized Preoxygenation: Pre-oxygenation with 100% oxygen Induction Type: IV induction Ventilation: Mask ventilation without difficulty and Oral airway inserted - appropriate to patient size Laryngoscope Size: Mac and 4 Grade View: Grade II Tube type: Oral Number of attempts: 1 Airway Equipment and Method: Stylet and Oral airway Placement Confirmation: ETT inserted through vocal cords under direct vision,  positive ETCO2 and breath sounds checked- equal and bilateral Secured at: 21 cm Tube secured with: Tape Dental Injury: Teeth and Oropharynx as per pre-operative assessment  Comments: Inserted by Prescilla Sours RN

## 2018-09-28 NOTE — Discharge Instructions (Signed)
[facility] LAPAROSCOPIC SURGERY: POST OP INSTRUCTIONS Always review your discharge instruction sheet given to you by the facility where your surgery was performed. IF YOU HAVE DISABILITY OR FAMILY LEAVE FORMS, YOU MUST BRING THEM TO THE OFFICE FOR PROCESSING.   DO NOT GIVE THEM TO YOUR DOCTOR.  1. A prescription for pain medication may be given to you upon discharge.  Take your pain medication as prescribed, if needed.  If narcotic pain medicine is not needed, then you may take acetaminophen (Tylenol) or ibuprofen (Advil) as needed. 2. Take your usually prescribed medications unless otherwise directed. 3. If you need a refill on your pain medication, please contact your pharmacy.  They will contact our office to request authorization. Prescriptions will not be filled after 5pm or on week-ends. 4. You should follow a light diet the first few days after arrival home, such as soup and crackers, etc.  Be sure to include lots of fluids daily. 5. Most patients will experience some swelling and bruising in the area of the incisions.  Ice packs will help.  Swelling and bruising can take several days to resolve.  6. It is common to experience some constipation if taking pain medication after surgery.  Increasing fluid intake and taking a stool softener (such as Colace) will usually help or prevent this problem from occurring.  A mild laxative (Milk of Magnesia or Miralax) should be taken according to package instructions if there are no bowel movements after 48 hours. 7. Unless discharge instructions indicate otherwise, you may remove your bandages 24-48 hours after surgery, and you may shower at that time.  You may have steri-strips (small skin tapes) in place directly over the incision.  These strips should be left on the skin for 7-10 days.  If your surgeon used skin glue on the incision, you may shower in 24 hours.  The glue will flake off over the next 2-3 weeks.  Any sutures or  staples will be removed at the office during your follow-up visit. 8. ACTIVITIES:  You may resume regular (light) daily activities beginning the next day--such as daily self-care, walking, climbing stairs--gradually increasing activities as tolerated.  You may have sexual intercourse when it is comfortable.  Refrain from any heavy lifting or straining until approved by your doctor. a. You may drive when you are no longer taking prescription pain medication, you can comfortably wear a seatbelt, and you can safely maneuver your car and apply brakes. b. RETURN TO WORK:  ___NEXT Monday 12/16 IF YOU ARE DOING OK c. _______________________________________________________ 9. You should see your doctor in the office for a follow-up appointment approximately 2-3 weeks after your surgery.  Make sure that you call for this appointment within a day or two after you arrive home to insure a convenient appointment time. 10. OTHER INSTRUCTIONS: OK TO SHOWER STARTING TOMORROW 11. ICE PACK, TYLENOL, AND IBUPROFEN ALSO FOR PAIN 12. NO LIFTING MORE THAN 15 POUNDS FOR 2 WEEKS __________________________________________________________________________________________________________________________ __________________________________________________________________________________________________________________________ WHEN TO CALL YOUR DOCTOR: 1. Fever over 101.0 2. Inability to urinate 3. Continued bleeding from incision. 4. Increased pain, redness, or drainage from the incision. 5. Increasing abdominal pain  The clinic staff is available to answer your questions during regular business hours.  Please dont hesitate to call and ask to speak to one of the nurses for clinical concerns.  If you have a medical emergency, go to the nearest emergency room or call 911.  A surgeon from [facility] is always on  call at the hospital. [address], [address], [place], [place]  [address] ? [address] [address],  [place], [place]   [address] [phone] ? [phone] ? FAX [phone] Web site: [email or web address]

## 2018-09-28 NOTE — Anesthesia Preprocedure Evaluation (Addendum)
Anesthesia Evaluation  Patient identified by MRN, date of birth, ID band Patient awake    Reviewed: Allergy & Precautions, NPO status , Patient's Chart, lab work & pertinent test results  Airway Mallampati: II  TM Distance: >3 FB Neck ROM: Full    Dental no notable dental hx. (+) Teeth Intact, Dental Advidsory Given   Pulmonary neg pulmonary ROS,    Pulmonary exam normal breath sounds clear to auscultation       Cardiovascular hypertension, Normal cardiovascular exam Rhythm:Regular Rate:Normal     Neuro/Psych negative neurological ROS  negative psych ROS   GI/Hepatic Neg liver ROS, GERD  ,  Endo/Other  negative endocrine ROS  Renal/GU negative Renal ROS  negative genitourinary   Musculoskeletal negative musculoskeletal ROS (+)   Abdominal   Peds negative pediatric ROS (+)  Hematology negative hematology ROS (+)   Anesthesia Other Findings   Reproductive/Obstetrics negative OB ROS                            Anesthesia Physical Anesthesia Plan  ASA: II  Anesthesia Plan: General   Post-op Pain Management:    Induction: Intravenous  PONV Risk Score and Plan: 3 and Ondansetron, Dexamethasone, Treatment may vary due to age or medical condition and Midazolam  Airway Management Planned: Oral ETT  Additional Equipment:   Intra-op Plan:   Post-operative Plan: Extubation in OR  Informed Consent: I have reviewed the patients History and Physical, chart, labs and discussed the procedure including the risks, benefits and alternatives for the proposed anesthesia with the patient or authorized representative who has indicated his/her understanding and acceptance.   Dental advisory given and Dental Advisory Given  Plan Discussed with: CRNA and Surgeon  Anesthesia Plan Comments:        Anesthesia Quick Evaluation

## 2018-09-28 NOTE — Op Note (Signed)
Laparoscopic Cholecystectomy Procedure Note  Indications: This patient presents with symptomatic gallbladder disease and will undergo laparoscopic cholecystectomy.  Pre-operative Diagnosis: symptomatic cholelithiasis  Post-operative Diagnosis: Same  Surgeon: Abigail MiyamotoBLACKMAN,Cornellius Kropp A   Assistants: 0  Anesthesia: General endotracheal anesthesia  ASA Class: 2  Procedure Details  The patient was seen again in the Holding Room. The risks, benefits, complications, treatment options, and expected outcomes were discussed with the patient. The possibilities of reaction to medication, pulmonary aspiration, perforation of viscus, bleeding, recurrent infection, finding a normal gallbladder, the need for additional procedures, failure to diagnose a condition, the possible need to convert to an open procedure, and creating a complication requiring transfusion or operation were discussed with the patient. The likelihood of improving the patient's symptoms with return to their baseline status is good.  The patient and/or family concurred with the proposed plan, giving informed consent. The site of surgery properly noted. The patient was taken to Operating Room, identified as Sheri Malone and the procedure verified as Laparoscopic Cholecystectomy with Intraoperative Cholangiogram. A Time Out was held and the above information confirmed.  Prior to the induction of general anesthesia, antibiotic prophylaxis was administered. General endotracheal anesthesia was then administered and tolerated well. After the induction, the abdomen was prepped with Chloraprep and draped in sterile fashion. The patient was positioned in the supine position.  Local anesthetic agent was injected into the skin near the umbilicus and an incision made. We dissected down to the abdominal fascia with blunt dissection.  The fascia was incised vertically and we entered the peritoneal cavity bluntly.  A pursestring suture of 0-Vicryl was placed  around the fascial opening.  The Hasson cannula was inserted and secured with the stay suture.  Pneumoperitoneum was then created with CO2 and tolerated well without any adverse changes in the patient's vital signs. A 5-mm port was placed in the subxiphoid position.  Two 5-mm ports were placed in the right upper quadrant. All skin incisions were infiltrated with a local anesthetic agent before making the incision and placing the trocars.   We positioned the patient in reverse Trendelenburg, tilted slightly to the patient's left.  The gallbladder was distended, full of stones, and chronically inflamed with a thickened wall.  I had to first aspirate bile which was clear from the gallbladder in order to grasp it.  The fundus grasped and retracted cephalad. Adhesions were lysed bluntly and with the electrocautery where indicated, taking care not to injure any adjacent organs or viscus. The infundibulum was grasped and retracted laterally, exposing the peritoneum overlying the triangle of Calot. This was then divided and exposed in a blunt fashion. The cystic duct was clearly identified and bluntly dissected circumferentially. A critical view of the cystic duct and cystic artery was obtained.  The cystic duct was then ligated with clips and divided. The cystic artery was, dissected free, ligated with clips and divided as well.   The gallbladder was dissected from the liver bed in retrograde fashion with the electrocautery. The gallbladder was removed and placed in an Endocatch sac. The liver bed was irrigated and inspected. Hemostasis was achieved with the electrocautery. Copious irrigation was utilized and was repeatedly aspirated until clear.  The gallbladder and Endocatch sac were then removed through the umbilical port site.  The pursestring suture was used to close the umbilical fascia.    We again inspected the right upper quadrant for hemostasis.  Pneumoperitoneum was released as we removed the trocars.  4-0  Monocryl was used to close  the skin.   Skin glue was then applied applied. The patient was then extubated and brought to the recovery room in stable condition. Instrument, sponge, and needle counts were correct at closure and at the conclusion of the case.   Findings: Chronic Cholecystitis with Cholelithiasis  Estimated Blood Loss: Minimal         Drains: 0         Specimens: Gallbladder           Complications: None; patient tolerated the procedure well.         Disposition: PACU - hemodynamically stable.         Condition: stable

## 2018-09-29 ENCOUNTER — Encounter (HOSPITAL_COMMUNITY): Payer: Self-pay | Admitting: Surgery

## 2018-10-26 ENCOUNTER — Ambulatory Visit: Payer: 59 | Admitting: Gastroenterology

## 2018-10-28 ENCOUNTER — Ambulatory Visit (HOSPITAL_COMMUNITY)
Admission: EM | Admit: 2018-10-28 | Discharge: 2018-10-28 | Disposition: A | Discharge status: Home / Self Care | Payer: 59 | Attending: Family Medicine | Admitting: Family Medicine

## 2018-10-28 ENCOUNTER — Encounter (HOSPITAL_COMMUNITY): Payer: Self-pay | Admitting: Family Medicine

## 2018-10-28 DIAGNOSIS — J209 Acute bronchitis, unspecified: Secondary | ICD-10-CM

## 2018-10-28 DIAGNOSIS — J320 Chronic maxillary sinusitis: Secondary | ICD-10-CM | POA: Diagnosis not present

## 2018-10-28 MED ORDER — AMOXICILLIN 875 MG PO TABS: 875.0000 mg | ORAL_TABLET | Freq: Two times a day (BID) | ORAL | 0 refills | Status: DC

## 2018-10-28 MED ORDER — BENZONATATE 100 MG PO CAPS: 100.0000 mg | ORAL_CAPSULE | Freq: Three times a day (TID) | ORAL | 0 refills | Status: DC | PRN

## 2018-10-28 MED ORDER — PREDNISONE 20 MG PO TABS: ORAL_TABLET | ORAL | 0 refills | Status: DC

## 2018-10-28 MED ORDER — FLUCONAZOLE 150 MG PO TABS: 150.0000 mg | ORAL_TABLET | Freq: Once | ORAL | 0 refills | Status: DC

## 2018-10-28 MED ORDER — FLUCONAZOLE 150 MG PO TABS: 150.0000 mg | ORAL_TABLET | Freq: Once | ORAL | 0 refills | Status: AC

## 2018-10-28 NOTE — ED Provider Notes (Signed)
[facility]    CSN: [id number] Arrival date & time: 10/28/18  0800     History   Chief Complaint No chief complaint on file.   HPI Sheri Malone is a 33 y.o. female.   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  She developed upper respiratory symptoms on Christmas, 16 days ago.  Patient has a history of asthma.  She is complaining of cough, right ear pain, sore throat, and sinus congestion.  She has not had a fever.  She is unable to use her inhaler because it makes her cough.  Patient has a 16-year-old and a 83-month-old.  She is nursing the 67-month-old.     Past Medical History:  Diagnosis Date  . Abnormal Pap smear   . Asthma   . GERD (gastroesophageal reflux disease)   . Gestational HTN   . Headache(784.0)   . History of ectopic pregnancy   . History of gestational diabetes   . Low hemoglobin    on ironspan  . Rhinitis   . Vaginal Pap smear, abnormal     Patient Active Problem List   Diagnosis Date Noted  . Allergic rhinitis 11/23/2016  . Allergic conjunctivitis 01/17/2016  . Mild intermittent asthma 01/17/2016  . Dermatitis, eczematoid 06/21/2015  . H/O cesarean section 06/21/2015  . History of diabetes mellitus arising in pregnancy 06/21/2015  . Headache, migraine 06/21/2015    Past Surgical History:  Procedure Laterality Date  . CESAREAN SECTION N/A 05/20/2013   Procedure: CESAREAN SECTION;  Surgeon: Genia Del, MD;  Location: [facility];  Service: Obstetrics;  Laterality: N/A;  primary  . CESAREAN SECTION N/A 02/04/2018   Procedure: Repeat CESAREAN SECTION;  Surgeon: Noland Fordyce, MD;  Location: [facility];  Service: Obstetrics;  Laterality: N/A;  EDD: 02/16/18  . CHOLECYSTECTOMY N/A 09/28/2018   Procedure: LAPAROSCOPIC CHOLECYSTECTOMY;  Surgeon: Abigail Miyamoto, MD;  Location: [facility] OR;  Service: General;  Laterality: N/A;  . DILATION AND CURETTAGE OF UTERUS  2009  . SALPINGOOPHORECTOMY  2009    left  . UNILATERAL SALPINGECTOMY Right 02/04/2018   Procedure: UNILATERAL SALPINGECTOMY;  Surgeon: Noland Fordyce, MD;  Location: [facility]TH[facility];  Service: Obstetrics;  Laterality: Right;  . WISDOM TOOTH EXTRACTION      OB History    Gravida  3   Para  2   Term  2   Preterm      AB  1   Living  2     SAB      TAB      Ectopic  1   Multiple  0   Live Births  2            Home Medications    Prior to Admission medications   Medication Sig Start Date End Date Taking? Authorizing Provider  albuterol (PROVENTIL HFA;VENTOLIN HFA) 108 (90 Base) MCG/ACT inhaler Inhale 1 puff into the lungs every 6 (six) hours as needed for wheezing or shortness of breath.    [provider]  amoxicillin (AMOXIL) 875 MG tablet Take 1 tablet (875 mg total) by mouth 2 (two) times daily. 10/28/18   Elvina Sidle, MD  benzonatate (TESSALON) 100 MG capsule Take 1-2 capsules (100-200 mg total) by mouth 3 (three) times daily as needed for cough. 10/28/18   Elvina Sidle, MD  EPINEPHrine (EPIPEN 2-PAK) 0.3 mg/0.3 mL IJ SOAJ injection Inject 0.3 mLs (0.3 mg total) into the muscle once. 06/21/15   Sowles,  Danna HeftyKrichna, MD  fluconazole (DIFLUCAN) 150 MG tablet Take 1 tablet (150 mg total) by mouth once for 1 dose. Repeat if needed 10/28/18 10/28/18  Elvina SidleLauenstein, Catrinia Racicot, MD  ibuprofen (ADVIL,MOTRIN) 800 MG tablet Take 1 tablet (800 mg total) by mouth every 8 (eight) hours as needed for moderate pain. 06/08/18   Eustace MooreNelson, Yvonne Sue, MD  pantoprazole (PROTONIX) 40 MG tablet Take 40 mg by mouth daily.    [provider]  predniSONE (DELTASONE) 20 MG tablet Two daily with food 10/28/18   Elvina SidleLauenstein, Jawara Latorre, MD  Prenat-Fe Carbonyl-FA-Omega 3 (ONE-A-DAY WOMENS PRENATAL 1 PO) Take 1 tablet by mouth daily.    [provider]    Family History Family History  Problem Relation Age of Onset  . Diabetes Father   . Hypertension Father   . Cancer Maternal Grandfather        prostate  . Heart  disease Paternal Grandfather   . Migraines Mother   . Hypertension Mother   . Breast cancer Mother   . Multiple sclerosis Sister     Social History Social History   Tobacco Use  . Smoking status: Never Smoker  . Smokeless tobacco: Never Used  Substance Use Topics  . Alcohol use: No    Alcohol/week: 0.0 standard drinks    Frequency: Never  . Drug use: No     Allergies   Dust mite mixed allergen ext [mite (d. farinae)] and Other   Review of Systems Review of Systems  Constitutional: Positive for fatigue. Negative for fever.  HENT: Positive for congestion, sneezing and sore throat.   Eyes: Negative.   Respiratory: Positive for cough and wheezing.   Cardiovascular: Negative for chest pain.     Physical Exam Triage Vital Signs ED Triage Vitals  Enc Vitals Group     BP      Pulse      Resp      Temp      Temp src      SpO2      Weight      Height      Head Circumference      Peak Flow      Pain Score      Pain Loc      Pain Edu?      Excl. in GC?    No data found.  Updated Vital Signs BP 120/82 (BP Location: Left Arm)   Pulse 89   Temp 98.2 F (36.8 C) (Oral)   Resp 18   SpO2 99%    Physical Exam Vitals signs and nursing note reviewed.  Constitutional:      General: She is not in acute distress.    Appearance: Normal appearance. She is normal weight. She is not ill-appearing.  HENT:     Head: Normocephalic and atraumatic.     Left Ear: Tympanic membrane normal.     Ears:     Comments: Dull right TM    Nose: Congestion present.     Mouth/Throat:     Mouth: Mucous membranes are moist.     Pharynx: Oropharynx is clear.  Eyes:     Conjunctiva/sclera: Conjunctivae normal.  Neck:     Musculoskeletal: Normal range of motion and neck supple.  Cardiovascular:     Heart sounds: Normal heart sounds.  Pulmonary:     Breath sounds: Wheezing present.  Musculoskeletal: Normal range of motion.  Skin:    General: Skin is warm and dry.  Neurological:  General: No focal deficit present.     Mental Status: She is alert.  Psychiatric:        Mood and Affect: Mood normal.        Behavior: Behavior normal.        Thought Content: Thought content normal.      UC Treatments / Results  Labs (all labs ordered are listed, but only abnormal results are displayed) Labs Reviewed - No data to display  EKG None  Radiology No results found.  Procedures Procedures (including critical care time)  Medications Ordered in UC Medications - No data to display  Initial Impression / Assessment and Plan / UC Course  I have reviewed the triage vital signs and the nursing notes.  Pertinent labs & imaging results that were available during my care of the patient were reviewed by me and considered in my medical decision making (see chart for details).    Final Clinical Impressions(s) / UC Diagnoses   Final diagnoses:  Acute bronchitis, unspecified organism  Chronic maxillary sinusitis   Discharge Instructions   None    ED Prescriptions    Medication Sig Dispense Auth. Provider   predniSONE (DELTASONE) 20 MG tablet Two daily with food 10 tablet Elvina Sidle, MD   benzonatate (TESSALON) 100 MG capsule Take 1-2 capsules (100-200 mg total) by mouth 3 (three) times daily as needed for cough. 40 capsule Elvina Sidle, MD   amoxicillin (AMOXIL) 875 MG tablet Take 1 tablet (875 mg total) by mouth 2 (two) times daily. 14 tablet Elvina Sidle, MD   fluconazole (DIFLUCAN) 150 MG tablet  (Status: Discontinued) Take 1 tablet (150 mg total) by mouth once for 1 dose. Repeat if needed 2 tablet Elvina Sidle, MD   fluconazole (DIFLUCAN) 150 MG tablet Take 1 tablet (150 mg total) by mouth once for 1 dose. Repeat if needed 2 tablet Elvina Sidle, MD     Controlled Substance Prescriptions [place] Controlled Substance Registry consulted? Not Applicable   Elvina Sidle, MD 10/28/18 [phone]

## 2018-10-28 NOTE — ED Notes (Signed)
Seen  by provider only

## 2018-12-08 ENCOUNTER — Encounter: Payer: Self-pay | Admitting: Physical Therapy

## 2018-12-08 ENCOUNTER — Ambulatory Visit: Payer: 59 | Attending: Adult Health | Admitting: Physical Therapy

## 2018-12-08 ENCOUNTER — Other Ambulatory Visit: Payer: Self-pay

## 2018-12-08 DIAGNOSIS — R293 Abnormal posture: Secondary | ICD-10-CM | POA: Insufficient documentation

## 2018-12-08 DIAGNOSIS — M542 Cervicalgia: Secondary | ICD-10-CM | POA: Diagnosis present

## 2018-12-08 NOTE — Therapy (Signed)
[facility] [address] [place], [place], [address] Phone: [phone]   Fax:  [phone]  Physical Therapy Evaluation  Patient Details  Name: Sheri Malone MRN: [id number] Date of Birth: 02/08/1986 Referring Provider (PT): George Hugh, NP   Encounter Date: 12/08/2018  PT End of Session - 12/08/18 1203    Visit Number  1    Number of Visits  9    Date for PT Re-Evaluation  01/07/19    Authorization Type  UHC 60 visit limit    PT Start Time  1149    PT Stop Time  1230    PT Time Calculation (min)  41 min    Activity Tolerance  Patient tolerated treatment well    Behavior During Therapy  [facility] for tasks assessed/performed       Past Medical History:  Diagnosis Date  . Abnormal Pap smear   . Asthma   . GERD (gastroesophageal reflux disease)   . Gestational HTN   . Headache(784.0)   . History of ectopic pregnancy   . History of gestational diabetes   . Low hemoglobin    on ironspan  . Rhinitis   . Vaginal Pap smear, abnormal     Past Surgical History:  Procedure Laterality Date  . CESAREAN SECTION N/A 05/20/2013   Procedure: CESAREAN SECTION;  Surgeon: Genia Del, MD;  Location: [facility];  Service: Obstetrics;  Laterality: N/A;  primary  . CESAREAN SECTION N/A 02/04/2018   Procedure: Repeat CESAREAN SECTION;  Surgeon: Noland Fordyce, MD;  Location: [facility]IRTHING SU[facility];  Service: Obstetrics;  Laterality: N/A;  EDD: 02/16/18  . CHOLECYSTECTOMY N/A 09/28/2018   Procedure: LAPAROSCOPIC CHOLECYSTECTOMY;  Surgeon: Abigail Miyamoto, MD;  Location: [facility] OR;  Service: General;  Laterality: N/A;  . DILATION AND CURETTAGE OF UTERUS  2009  . SALPINGOOPHORECTOMY  2009   left  . UNILATERAL SALPINGECTOMY Right 02/04/2018   Procedure: UNILATERAL SALPINGECTOMY;  Surgeon: Noland Fordyce, MD;  Location: [facility] BIRTHING SUITES;  Service: Obstetrics;  Laterality: Right;  . WISDOM TOOTH EXTRACTION      There were no vitals filed  for this visit.   Subjective Assessment - 12/08/18 1156    Subjective  Began about 6 years ago after having my daughter. One day I woke up after cradling my daughter (about 10 mo) the Right side of my neck was swollen. On and off bil shoulder pain and throbbing HA that feels like a brain freeze. Muscle relaxers help HA pain. My hands go to sleep when pain is really high. Denies regular exercise.     Patient Stated Goals  decreae pain/HA    Currently in Pain?  Yes    Pain Score  5     Pain Location  Shoulder    Pain Orientation  Right    Pain Descriptors / Indicators  Sore    Aggravating Factors   holding son/carrying car seat, posture at night    Pain Relieving Factors  muscle relaxers         [facility] PT Assessment - 12/08/18 0001      Assessment   Medical Diagnosis  cervicalgia    Referring Provider (PT)  George Hugh, NP    Onset Date/Surgical Date  --   about 6 years ago   Hand Dominance  Right    Prior Therapy  yes, not this year      Precautions   Precautions  None      Restrictions   Weight Bearing  Restrictions  No      Balance Screen   Has the patient fallen in the past 6 months  No      Home Environment   Living Environment  Private residence    Additional Comments  stairs at home      Prior Function   Level of Independence  Independent    Vocation  Full time employment    Chief Financial OfficerVocation Requirements  social worker    Leisure  care for 2 children      Cognition   Overall Cognitive Status  Within Functional Limits for tasks assessed      Observation/Other Assessments   Focus on Therapeutic Outcomes (FOTO)   48% limited      Sensation   Additional Comments  occasional hands feel like they are going to sleep      Posture/Postural Control   Posture Comments  rounded shoulders, forward head.       ROM / Strength   AROM / PROM / Strength  AROM;Strength      AROM   AROM Assessment Site  Cervical    Cervical Flexion  46    Cervical Extension  40     Cervical - Right Side Bend  26    Cervical - Left Side Bend  24   discomfort   Cervical - Right Rotation  66    Cervical - Left Rotation  66      Strength   Strength Assessment Site  Shoulder    Right/Left Shoulder  Right;Left    Right Shoulder External Rotation  4/5    Left Shoulder External Rotation  4/5      Palpation   Palpation comment  bil upper traps TTP                Objective measurements completed on examination: See above findings.      [facility] Adult PT Treatment/Exercise - 12/08/18 0001      Exercises   Exercises  Neck      Neck Exercises: Seated   Other Seated Exercise  scapular retraction for resting posture      Manual Therapy   Manual Therapy  Soft tissue mobilization    Manual therapy comments  skilled palpation and monitoring during TPDN    Soft tissue mobilization  bilateral upper traps      Neck Exercises: Stretches   Upper Trapezius Stretch Limitations  bilateral, seated       Trigger Point Dry Needling - 12/08/18 1228    Consent Given?  Yes    Education Handout Provided  --   verbal education   Muscles Treated Upper Body  Upper trapezius    Upper Trapezius Response  Twitch reponse elicited;Palpable increased muscle length   bilateral          PT Education - 12/08/18 1325    Education Details  anatomy of condition, POC, HEP, exercise form/rationale, TPDN & expected outcomes, posture, long term HEP    Person(s) Educated  Patient    Methods  Explanation;Demonstration;Tactile cues;Verbal cues    Comprehension  Verbalized understanding;Need further instruction;Returned demonstration;Verbal cues required;Tactile cues required          PT Long Term Goals - 12/08/18 1321      PT LONG TERM GOAL #1   Title  Pt will be able to utilize stretches and postures at work to decrease upper trap tension    Baseline  increasing tension through the day with increased HA pain  Time  4    Period  Weeks    Status  New    Target Date   01/07/19      PT LONG TERM GOAL #2   Title  pt will be able to hold son without increase in upper trap pain    Baseline  significant and causes HA at eval    Time  4    Period  Weeks    Status  New    Target Date  01/07/19      PT LONG TERM GOAL #3   Title  GHJ ER strength to 5/5 to indicate necessary strength for postural support     Baseline  4/5 at eval    Time  4    Period  Weeks    Status  New    Target Date  01/07/19      PT LONG TERM GOAL #4   Title  FOTO to 36% limited    Baseline  48% limited at eval    Time  4    Period  Weeks    Status  New    Target Date  01/07/19             Plan - 12/08/18 1302    Clinical Impression Statement  Pt presents to PT with complaints of chronic neck pain that extends into bilateral upper traps. She has had PT in the past which helped for a short while but then she stopped doing the exericses. Has not tried dry needling which was utilized today. Pt reported soreness as expected and was encouraged to continue moving and be aware of posture. Pt will benefit from skilled PT in order to improve postural strength and endurance to reduce tightness of upper traps.     History and Personal Factors relevant to plan of care:  works on Animator, breast feeding    Clinical Presentation  Stable    Clinical Decision Making  Low    Rehab Potential  Good    PT Frequency  2x / week    PT Duration  4 weeks    PT Treatment/Interventions  ADLs/Self Care Home Management;Cryotherapy;Electrical Stimulation;Traction;Therapeutic activities;Therapeutic exercise;Patient/family education;Manual techniques;Passive range of motion;Taping;Dry needling    PT Next Visit Plan  outcome of DN? continue as needed, periscapular strengthening & endurance    PT Home Exercise Plan  scap retraction, upper trap stretch    Consulted and Agree with Plan of Care  Patient       Patient will benefit from skilled therapeutic intervention in order to improve the following  deficits and impairments:  Pain, Improper body mechanics, Impaired sensation, Increased muscle spasms, Postural dysfunction, Decreased strength, Decreased activity tolerance, Decreased endurance, Impaired flexibility  Visit Diagnosis: Cervicalgia - Plan: PT plan of care cert/re-cert  Abnormal posture - Plan: PT plan of care cert/re-cert     Problem List Patient Active Problem List   Diagnosis Date Noted  . Allergic rhinitis 11/23/2016  . Allergic conjunctivitis 01/17/2016  . Mild intermittent asthma 01/17/2016  . Dermatitis, eczematoid 06/21/2015  . H/O cesarean section 06/21/2015  . History of diabetes mellitus arising in pregnancy 06/21/2015  . Headache, migraine 06/21/2015    Gaylen Venning C. Ahlivia Salahuddin PT, DPT 12/08/18 1:28 PM   [facility] [address] [place], [place], [address] Phone: [phone]   Fax:  [phone]  Name: LANDON MOLESKI MRN: [id number] Date of Birth: [birth year]

## 2018-12-09 ENCOUNTER — Ambulatory Visit: Payer: 59 | Admitting: Physical Therapy

## 2018-12-14 ENCOUNTER — Ambulatory Visit: Payer: 59 | Admitting: Physical Therapy

## 2018-12-17 ENCOUNTER — Encounter: Payer: Self-pay | Admitting: Physical Therapy

## 2018-12-17 ENCOUNTER — Ambulatory Visit: Payer: 59 | Admitting: Physical Therapy

## 2018-12-17 DIAGNOSIS — M542 Cervicalgia: Secondary | ICD-10-CM | POA: Diagnosis not present

## 2018-12-17 DIAGNOSIS — R293 Abnormal posture: Secondary | ICD-10-CM

## 2018-12-17 NOTE — Therapy (Signed)
[place], [place], [address] Phone: [phone]   Fax:  [phone]  Physical Therapy Treatment  Patient Details  Name: Sheri Malone MRN: [id number] Date of Birth: 04/06/1986 Referring Provider (PT): Venancio Poisson, NP   Encounter Date: 12/17/2018  PT End of Session - 12/17/18 0802    Visit Number  2    Number of Visits  9    Date for PT Re-Evaluation  01/07/19    Authorization Type  UHC 60 visit limit    PT Start Time  0802    PT Stop Time  0856    PT Time Calculation (min)  54 min    Activity Tolerance  Patient tolerated treatment well       Past Medical History:  Diagnosis Date  . Abnormal Pap smear   . Asthma   . GERD (gastroesophageal reflux disease)   . Gestational HTN   . Headache(784.0)   . History of ectopic pregnancy   . History of gestational diabetes   . Low hemoglobin    on ironspan  . Rhinitis   . Vaginal Pap smear, abnormal     Past Surgical History:  Procedure Laterality Date  . CESAREAN SECTION N/A 05/20/2013   Procedure: CESAREAN SECTION;  Surgeon: Princess Bruins, MD;  Location: [facility];  Service: Obstetrics;  Laterality: N/A;  primary  . CESAREAN SECTION N/A 02/04/2018   Procedure: Repeat CESAREAN SECTION;  Surgeon: Aloha Gell, MD;  Location: [place];  Service: Obstetrics;  Laterality: N/A;  EDD: 02/16/18  . CHOLECYSTECTOMY N/A 09/28/2018   Procedure: LAPAROSCOPIC CHOLECYSTECTOMY;  Surgeon: Coralie Keens, MD;  Location: [place];  Service: General;  Laterality: N/A;  . DILATION AND CURETTAGE OF UTERUS  2009  . SALPINGOOPHORECTOMY  2009   left  . UNILATERAL SALPINGECTOMY Right 02/04/2018   Procedure: UNILATERAL SALPINGECTOMY;  Surgeon: Aloha Gell, MD;  Location: [place];  Service: Obstetrics;  Laterality: Right;  . WISDOM TOOTH EXTRACTION      There were no vitals filed for this visit.  Subjective Assessment - 12/17/18 0803     Subjective  Pt reports she was a little sore after the DN, felt improvement for a couple days then some stiffness returned but not as much    Patient Stated Goals  decreae pain/HA    Currently in Pain?  Yes    Pain Score  4     Pain Location  Neck    Pain Orientation  Right    Pain Descriptors / Indicators  Tightness    Pain Type  Chronic pain    Pain Onset  More than a month ago    Pain Frequency  Constant    Aggravating Factors   holding son    Pain Relieving Factors  meds         [facility] PT Assessment - 12/17/18 0001      Assessment   Medical Diagnosis  cervicalgia      AROM   Cervical - Right Rotation  68    Cervical - Left Rotation  78                   [facility] Adult PT Treatment/Exercise - 12/17/18 0001      Neck Exercises: Machines for Strengthening   UBE (Upper Arm Bike)  L1x4' alt FWD/BWD      Neck Exercises: Supine   Shoulder ABduction  Both;20 reps   red band   Other Supine Exercise  2x10 bila UE ER with red band      Modalities   Modalities  Electrical Stimulation;Moist Heat      Moist Heat Therapy   Number Minutes Moist Heat  15 Minutes    Moist Heat Location  Cervical   and upper shoulders     Electrical Stimulation   Electrical Stimulation Location  bilat cervical and upper shoulders    Electrical Stimulation Action  IFC    Electrical Stimulation Parameters  to tolerance    Electrical Stimulation Goals  Tone;Pain      Manual Therapy   Soft tissue mobilization  STM to bilat upper traps, levators and cervical paraspinals.       Neck Exercises: Stretches   Upper Trapezius Stretch Limitations  bilateral, seated    Other Neck Stretches  doorway stretch low and mid       Trigger Point Dry Needling - 12/17/18 0817    Consent Given?  Yes    Education Handout Provided  No    Muscles Treated Upper Body  Upper trapezius;Levator scapulae;Longissimus    Upper Trapezius Response  Palpable increased muscle length;Twitch reponse elicited   bilat    Levator Scapulae Response  Palpable increased muscle length;Twitch response elicited   bilat   Longissimus Response  Palpable increased muscle length;Twitch response elicited   cervical bilat C2-4          PT Education - 12/17/18 0815    Education Details  HEP    Person(s) Educated  Patient    Methods  Explanation;Demonstration;Handout    Comprehension  Returned demonstration;Verbalized understanding          PT Long Term Goals - 12/17/18 0845      PT LONG TERM GOAL #1   Title  Pt will be able to utilize stretches and postures at work to decrease upper trap tension    Status  On-going      PT LONG TERM GOAL #2   Title  pt will be able to hold son without increase in upper trap pain    Status  On-going      PT LONG TERM GOAL #3   Title  GHJ ER strength to 5/5 to indicate necessary strength for postural support     Status  On-going      PT LONG TERM GOAL #4   Title  FOTO to 36% limited    Status  On-going            Plan - 12/17/18 0846    Clinical Impression Statement  Pt reports increased motion in her neck after the last visit, this was confirmed with measurements.  She still has some tightness and trigger points in her neck and upper shoulders.  No goals met as it is only her second visit.  Had some fatigue with band exercises.     Rehab Potential  Good    PT Frequency  2x / week    PT Duration  4 weeks    PT Treatment/Interventions  ADLs/Self Care Home Management;Cryotherapy;Electrical Stimulation;Traction;Therapeutic activities;Therapeutic exercise;Patient/family education;Manual techniques;Passive range of motion;Taping;Dry needling    PT Next Visit Plan  assess tolerance of new HEP, and DN, progress as able     Consulted and Agree with Plan of Care  Patient       Patient will benefit from skilled therapeutic intervention in order to improve the following deficits and impairments:  Pain, Improper body mechanics, Impaired sensation, Increased muscle  spasms, Postural dysfunction, Decreased strength, Decreased activity  tolerance, Decreased endurance, Impaired flexibility  Visit Diagnosis: Cervicalgia  Abnormal posture     Problem List Patient Active Problem List   Diagnosis Date Noted  . Allergic rhinitis 11/23/2016  . Allergic conjunctivitis 01/17/2016  . Mild intermittent asthma 01/17/2016  . Dermatitis, eczematoid 06/21/2015  . H/O cesarean section 06/21/2015  . History of diabetes mellitus arising in pregnancy 06/21/2015  . Headache, migraine 06/21/2015    Jeral Pinch PT  12/17/2018, 8:49 AM  [facility] [address] [place], [place], [address] Phone: [phone]   Fax:  [phone]  Name: Sheri Malone MRN: [id number] Date of Birth: Mar 08, 1986

## 2018-12-20 ENCOUNTER — Ambulatory Visit: Payer: 59 | Attending: Adult Health | Admitting: Physical Therapy

## 2018-12-20 ENCOUNTER — Encounter: Payer: Self-pay | Admitting: Physical Therapy

## 2018-12-20 DIAGNOSIS — R293 Abnormal posture: Secondary | ICD-10-CM | POA: Insufficient documentation

## 2018-12-20 DIAGNOSIS — M542 Cervicalgia: Secondary | ICD-10-CM | POA: Diagnosis not present

## 2018-12-20 NOTE — Therapy (Signed)
[facility] Outpatient Rehabilitation [facility] [address] [place], [place], [address] Phone: [phone]   Fax:  [phone]  Physical Therapy Treatment  Patient Details  Name: Sheri Malone MRN: [id number] Date of Birth: 28-Dec-1985 Referring Provider (PT): George Hugh, NP   Encounter Date: 12/20/2018  PT End of Session - 12/20/18 0934    Visit Number  3    Number of Visits  9    Date for PT Re-Evaluation  01/07/19    Authorization Type  UHC 60 visit limit    PT Start Time  0933    PT Stop Time  1015    PT Time Calculation (min)  42 min    Activity Tolerance  Patient tolerated treatment well    Behavior During Therapy  [facility] for tasks assessed/performed       Past Medical History:  Diagnosis Date  . Abnormal Pap smear   . Asthma   . GERD (gastroesophageal reflux disease)   . Gestational HTN   . Headache(784.0)   . History of ectopic pregnancy   . History of gestational diabetes   . Low hemoglobin    on ironspan  . Rhinitis   . Vaginal Pap smear, abnormal     Past Surgical History:  Procedure Laterality Date  . CESAREAN SECTION N/A 05/20/2013   Procedure: CESAREAN SECTION;  Surgeon: Genia Del, MD;  Location: [facility];  Service: Obstetrics;  Laterality: N/A;  primary  . CESAREAN SECTION N/A 02/04/2018   Procedure: Repeat CESAREAN SECTION;  Surgeon: Noland Fordyce, MD;  Location: [facility] BIRTHING SUITES;  Service: Obstetrics;  Laterality: N/A;  EDD: 02/16/18  . CHOLECYSTECTOMY N/A 09/28/2018   Procedure: LAPAROSCOPIC CHOLECYSTECTOMY;  Surgeon: Abigail Miyamoto, MD;  Location: [facility] OR;  Service: General;  Laterality: N/A;  . DILATION AND CURETTAGE OF UTERUS  2009  . SALPINGOOPHORECTOMY  2009   left  . UNILATERAL SALPINGECTOMY Right 02/04/2018   Procedure: UNILATERAL SALPINGECTOMY;  Surgeon: Noland Fordyce, MD;  Location: [facility] BIRTHING SUITES;  Service: Obstetrics;  Laterality: Right;  . WISDOM TOOTH EXTRACTION      There were no vitals filed  for this visit.  Subjective Assessment - 12/20/18 0934    Subjective  Was not as sore after last DN session. I may be tightening up from pain making my mid back hurt for a day or so after. Not really feeling any pain today. Getting a new mattress on Friday.     Patient Stated Goals  decreae pain/HA    Currently in Pain?  No/denies    Aggravating Factors   laying down at night- feels pulling                       [facility] Adult PT Treatment/Exercise - 12/20/18 0001      Neck Exercises: Seated   W Back Limitations  AROM no resistance    Shoulder Abduction Limitations  seated Horiz abd red tband    Other Seated Exercise  thoracic mobs- pool noodle in chair      Manual Therapy   Manual Therapy  Joint mobilization    Manual therapy comments  edu on self scalene massage    Joint Mobilization  thoracic PA, gross rib ER    Soft tissue mobilization  scalenes, upper traps, suboccipitals      Neck Exercises: Stretches   Other Neck Stretches  scalens             PT Education - 12/20/18 1154  Education Details  car/computer posture, regular, short exercises rather than grouping once in a day, foot stool for support, exercise & manual rational    Person(s) Educated  Patient    Methods  Explanation;Demonstration;Tactile cues;Verbal cues    Comprehension  Verbalized understanding;Need further instruction;Returned demonstration;Verbal cues required;Tactile cues required          PT Long Term Goals - 12/17/18 0845      PT LONG TERM GOAL #1   Title  Pt will be able to utilize stretches and postures at work to decrease upper trap tension    Status  On-going      PT LONG TERM GOAL #2   Title  pt will be able to hold son without increase in upper trap pain    Status  On-going      PT LONG TERM GOAL #3   Title  GHJ ER strength to 5/5 to indicate necessary strength for postural support     Status  On-going      PT LONG TERM GOAL #4   Title  FOTO to 36% limited     Status  On-going            Plan - 12/20/18 1015    Clinical Impression Statement  Requested that she do 10 min of heat or hot shower prior to bed tonight- lay hooklying and take deep breaths through stretch. concordant pain and tingling in scalenes bil, but Lt was worse. Exercises adapted for chair at at work/in car.     PT Treatment/Interventions  ADLs/Self Care Home Management;Cryotherapy;Electrical Stimulation;Traction;Therapeutic activities;Therapeutic exercise;Patient/family education;Manual techniques;Passive range of motion;Taping;Dry needling    PT Next Visit Plan  DN PRN, how did scalene stretch go? 10 min heat before bed?    PT Home Exercise Plan  scap retraction, upper trap stretch, band work through the day    Consulted and Agree with Plan of Care  Patient       Patient will benefit from skilled therapeutic intervention in order to improve the following deficits and impairments:  Pain, Improper body mechanics, Impaired sensation, Increased muscle spasms, Postural dysfunction, Decreased strength, Decreased activity tolerance, Decreased endurance, Impaired flexibility  Visit Diagnosis: Cervicalgia  Abnormal posture     Problem List Patient Active Problem List   Diagnosis Date Noted  . Allergic rhinitis 11/23/2016  . Allergic conjunctivitis 01/17/2016  . Mild intermittent asthma 01/17/2016  . Dermatitis, eczematoid 06/21/2015  . H/O cesarean section 06/21/2015  . History of diabetes mellitus arising in pregnancy 06/21/2015  . Headache, migraine 06/21/2015   Yacoub Diltz C. Edwyna Dangerfield PT, DPT 12/20/18 11:55 AM   [facility] Outpatient Rehabilitation [facility] [address] [place], [place], [address] Phone: [phone]   Fax:  [phone]  Name: Sheri Malone MRN: [id number] Date of Birth: 1986-04-25

## 2018-12-24 ENCOUNTER — Encounter: Payer: Self-pay | Admitting: Physical Therapy

## 2018-12-24 ENCOUNTER — Ambulatory Visit: Payer: 59 | Admitting: Physical Therapy

## 2018-12-24 DIAGNOSIS — M542 Cervicalgia: Secondary | ICD-10-CM

## 2018-12-24 DIAGNOSIS — R293 Abnormal posture: Secondary | ICD-10-CM

## 2018-12-24 NOTE — Therapy (Signed)
[facility] Outpatient Rehabilitation [facility] [address] [place], [place], [address] Phone: [phone]   Fax:  [phone]  Physical Therapy Treatment  Patient Details  Name: Sheri Malone MRN: [id number] Date of Birth: [birth year] Referring Provider (PT): George Hugh, NP   Encounter Date: 12/24/2018  PT End of Session - 12/24/18 0804    Visit Number  4    Number of Visits  9    Date for PT Re-Evaluation  01/07/19    Authorization Type  UHC 60 visit limit    PT Start Time  0802    PT Stop Time  0850    PT Time Calculation (min)  48 min    Activity Tolerance  Patient tolerated treatment well    Behavior During Therapy  [facility] for tasks assessed/performed       Past Medical History:  Diagnosis Date  . Abnormal Pap smear   . Asthma   . GERD (gastroesophageal reflux disease)   . Gestational HTN   . Headache(784.0)   . History of ectopic pregnancy   . History of gestational diabetes   . Low hemoglobin    on ironspan  . Rhinitis   . Vaginal Pap smear, abnormal     Past Surgical History:  Procedure Laterality Date  . CESAREAN SECTION N/A 05/20/2013   Procedure: CESAREAN SECTION;  Surgeon: Genia Del, MD;  Location: [facility];  Service: Obstetrics;  Laterality: N/A;  primary  . CESAREAN SECTION N/A 02/04/2018   Procedure: Repeat CESAREAN SECTION;  Surgeon: Noland Fordyce, MD;  Location: [facility] BIRTHING SUITES;  Service: Obstetrics;  Laterality: N/A;  EDD: 02/16/18  . CHOLECYSTECTOMY N/A 09/28/2018   Procedure: LAPAROSCOPIC CHOLECYSTECTOMY;  Surgeon: Abigail Miyamoto, MD;  Location: [facility] OR;  Service: General;  Laterality: N/A;  . DILATION AND CURETTAGE OF UTERUS  2009  . SALPINGOOPHORECTOMY  2009   left  . UNILATERAL SALPINGECTOMY Right 02/04/2018   Procedure: UNILATERAL SALPINGECTOMY;  Surgeon: Noland Fordyce, MD;  Location: [facility] BIRTHING SUITES;  Service: Obstetrics;  Laterality: Right;  . WISDOM TOOTH EXTRACTION      There were no vitals filed  for this visit.  Subjective Assessment - 12/24/18 0804    Subjective  I just feel tired/tight feeling. not really pain.                        [facility] Adult PT Treatment/Exercise - 12/24/18 0001      Neck Exercises: Machines for Strengthening   UBE (Upper Arm Bike)  retro 3 min L1      Neck Exercises: Standing   Other Standing Exercises  shoulder ext with abd pull on red tband      Neck Exercises: Seated   Other Seated Exercise  shoulder external rotation red tband      Neck Exercises: Prone   W Back  10 reps    Shoulder Extension Limitations  scap retraction +UE extension    Other Prone Exercise  scapular retraction      Moist Heat Therapy   Number Minutes Moist Heat  10 Minutes    Moist Heat Location  --   cervical, thoracic     Manual Therapy   Joint Mobilization  thoracic and rib PA      Neck Exercises: Stretches   Upper Trapezius Stretch  Right;Left;20 seconds    Levator Stretch  Right;Left;20 seconds    Chest Stretch  2 reps;20 seconds   door pec stretch   Other Neck Stretches  open book    Other Neck Stretches  scalenes- with self massage                  PT Long Term Goals - 12/17/18 0845      PT LONG TERM GOAL #1   Title  Pt will be able to utilize stretches and postures at work to decrease upper trap tension    Status  On-going      PT LONG TERM GOAL #2   Title  pt will be able to hold son without increase in upper trap pain    Status  On-going      PT LONG TERM GOAL #3   Title  GHJ ER strength to 5/5 to indicate necessary strength for postural support     Status  On-going      PT LONG TERM GOAL #4   Title  FOTO to 36% limited    Status  On-going            Plan - 12/24/18 0841    Clinical Impression Statement  Decreasing stiffness and noticing fatigue in periscap region & shoulders. Discussed that aching and soreness should be expected. Cont to add strengthening exercises that she can do while working.     PT  Treatment/Interventions  ADLs/Self Care Home Management;Cryotherapy;Electrical Stimulation;Traction;Therapeutic activities;Therapeutic exercise;Patient/family education;Manual techniques;Passive range of motion;Taping;Dry needling    PT Next Visit Plan  DN PRN, increase prone challenges    PT Home Exercise Plan  scap retraction, upper trap stretch, band work through the day, ext rot red tband, extension with abd pull on tband    Consulted and Agree with Plan of Care  Patient       Patient will benefit from skilled therapeutic intervention in order to improve the following deficits and impairments:  Pain, Improper body mechanics, Impaired sensation, Increased muscle spasms, Postural dysfunction, Decreased strength, Decreased activity tolerance, Decreased endurance, Impaired flexibility  Visit Diagnosis: Cervicalgia  Abnormal posture     Problem List Patient Active Problem List   Diagnosis Date Noted  . Allergic rhinitis 11/23/2016  . Allergic conjunctivitis 01/17/2016  . Mild intermittent asthma 01/17/2016  . Dermatitis, eczematoid 06/21/2015  . H/O cesarean section 06/21/2015  . History of diabetes mellitus arising in pregnancy 06/21/2015  . Headache, migraine 06/21/2015    Kalan Yeley C. Shanequia Kendrick PT, DPT 12/24/18 8:44 AM   [facility] Outpatient Rehabilitation [facility] [address] [place], [place], [address] Phone: [phone]   Fax:  [phone]  Name: KRISELLE HEAVILIN MRN: [id number] Date of Birth: 1986-09-02

## 2018-12-24 NOTE — Patient Instructions (Signed)
Access Code: [email or web address]  URL: [email or web address]  Date: 12/24/2018  Prepared by: Valori Hollenkamp   Exercises  Standing Shoulder Extension with Dowel - 15 reps - 1 sets - 5s hold - 1x daily - 7x weekly  Standing Shoulder External Rotation with Resistance - 10 reps - 1 sets - 2s hold - 3x daily - 7x weekly   

## 2018-12-27 ENCOUNTER — Encounter: Payer: Self-pay | Admitting: Physical Therapy

## 2018-12-27 ENCOUNTER — Ambulatory Visit: Payer: 59 | Admitting: Physical Therapy

## 2018-12-27 DIAGNOSIS — R293 Abnormal posture: Secondary | ICD-10-CM

## 2018-12-27 DIAGNOSIS — M542 Cervicalgia: Secondary | ICD-10-CM | POA: Diagnosis not present

## 2018-12-27 NOTE — Therapy (Signed)
[facility] Outpatient Rehabilitation [facility] [address] [place], [place], [address] Phone: [phone]   Fax:  [phone]  Physical Therapy Treatment  Patient Details  Name: Sheri Malone MRN: [id number] Date of Birth: December 18, 1985 Referring Provider (PT): George Hugh, NP   Encounter Date: 12/27/2018  PT End of Session - 12/27/18 0939    Visit Number  5    Number of Visits  9    Date for PT Re-Evaluation  01/07/19    Authorization Type  UHC 60 visit limit    PT Start Time  0935    PT Stop Time  1020    PT Time Calculation (min)  45 min    Activity Tolerance  Patient tolerated treatment well    Behavior During Therapy  [facility] for tasks assessed/performed       Past Medical History:  Diagnosis Date  . Abnormal Pap smear   . Asthma   . GERD (gastroesophageal reflux disease)   . Gestational HTN   . Headache(784.0)   . History of ectopic pregnancy   . History of gestational diabetes   . Low hemoglobin    on ironspan  . Rhinitis   . Vaginal Pap smear, abnormal     Past Surgical History:  Procedure Laterality Date  . CESAREAN SECTION N/A 05/20/2013   Procedure: CESAREAN SECTION;  Surgeon: Genia Del, MD;  Location: [facility];  Service: Obstetrics;  Laterality: N/A;  primary  . CESAREAN SECTION N/A 02/04/2018   Procedure: Repeat CESAREAN SECTION;  Surgeon: Noland Fordyce, MD;  Location: [facility] BIRTHING SUITES;  Service: Obstetrics;  Laterality: N/A;  EDD: 02/16/18  . CHOLECYSTECTOMY N/A 09/28/2018   Procedure: LAPAROSCOPIC CHOLECYSTECTOMY;  Surgeon: Abigail Miyamoto, MD;  Location: [facility] OR;  Service: General;  Laterality: N/A;  . DILATION AND CURETTAGE OF UTERUS  2009  . SALPINGOOPHORECTOMY  2009   left  . UNILATERAL SALPINGECTOMY Right 02/04/2018   Procedure: UNILATERAL SALPINGECTOMY;  Surgeon: Noland Fordyce, MD;  Location: [facility] BIRTHING SUITES;  Service: Obstetrics;  Laterality: Right;  . WISDOM TOOTH EXTRACTION      There were no vitals filed  for this visit.  Subjective Assessment - 12/27/18 0937    Subjective  Palmar aspect of Lt forearm and palm feel numb/tingly. Began Saturday. Went to [employer] on Friday. tried scalene stretch which made the tingling increase but did not decrease it. carried baby on Saturday with back pack on.     Patient Stated Goals  decreae pain/HA    Currently in Pain?  Yes    Pain Score  7     Pain Location  Arm    Pain Orientation  Left;Lower    Pain Descriptors / Indicators  Throbbing    Aggravating Factors   unknown    Pain Relieving Factors  meds                       [facility] Adult PT Treatment/Exercise - 12/27/18 0001      Neck Exercises: Machines for Strengthening   UBE (Upper Arm Bike)  retro 4 min L1      Neck Exercises: Standing   Other Standing Exercises  narrow & wide rows blue tband    Other Standing Exercises  UE ext green tband      Neck Exercises: Seated   Other Seated Exercise  Lt first rib depression with sheet - 3 breaths with head turns in rest      Manual Therapy   Manual  therapy comments  skilled palpation and monitoring during TPDN    Joint Mobilization  Lt first rib with breathing    Soft tissue mobilization  Lt upper traps, scalenes      Neck Exercises: Stretches   Upper Trapezius Stretch  Right;Left;20 seconds    Other Neck Stretches  door pec stretch       Trigger Point Dry Needling - 12/27/18 0001    Upper Trapezius Response  Twitch reponse elicited;Palpable increased muscle length   Left in supine               PT Long Term Goals - 12/17/18 0845      PT LONG TERM GOAL #1   Title  Pt will be able to utilize stretches and postures at work to decrease upper trap tension    Status  On-going      PT LONG TERM GOAL #2   Title  pt will be able to hold son without increase in upper trap pain    Status  On-going      PT LONG TERM GOAL #3   Title  GHJ ER strength to 5/5 to indicate necessary strength for postural support      Status  On-going      PT LONG TERM GOAL #4   Title  FOTO to 36% limited    Status  On-going            Plan - 12/27/18 1015    Clinical Impression Statement  Elevated Left first rib creating distal symptoms and reduced with manual depression followed by DN of upper trap. Educated on self mob and exercises to encourage scapular depression.     PT Treatment/Interventions  ADLs/Self Care Home Management;Cryotherapy;Electrical Stimulation;Traction;Therapeutic activities;Therapeutic exercise;Patient/family education;Manual techniques;Passive range of motion;Taping;Dry needling    PT Next Visit Plan  DN PRN, increase prone challenges    PT Home Exercise Plan  scap retraction, upper trap stretch, band work through the day, ext rot red tband, extension with abd pull on tband; first rib depression    Consulted and Agree with Plan of Care  Patient       Patient will benefit from skilled therapeutic intervention in order to improve the following deficits and impairments:  Pain, Improper body mechanics, Impaired sensation, Increased muscle spasms, Postural dysfunction, Decreased strength, Decreased activity tolerance, Decreased endurance, Impaired flexibility  Visit Diagnosis: Cervicalgia  Abnormal posture     Problem List Patient Active Problem List   Diagnosis Date Noted  . Allergic rhinitis 11/23/2016  . Allergic conjunctivitis 01/17/2016  . Mild intermittent asthma 01/17/2016  . Dermatitis, eczematoid 06/21/2015  . H/O cesarean section 06/21/2015  . History of diabetes mellitus arising in pregnancy 06/21/2015  . Headache, migraine 06/21/2015    Jasmane Brockway C. Rosi Secrist PT, DPT 12/27/18 10:22 AM   [facility] Outpatient Rehabilitation [facility] [address] [place], [place], [address] Phone: [phone]   Fax:  [phone]  Name: Sheri Malone MRN: [id number] Date of Birth: 1985/10/22

## 2018-12-29 ENCOUNTER — Other Ambulatory Visit: Payer: Self-pay

## 2018-12-29 ENCOUNTER — Ambulatory Visit: Payer: 59 | Admitting: Physical Therapy

## 2018-12-29 ENCOUNTER — Encounter: Payer: Self-pay | Admitting: Physical Therapy

## 2018-12-29 DIAGNOSIS — R293 Abnormal posture: Secondary | ICD-10-CM

## 2018-12-29 DIAGNOSIS — M542 Cervicalgia: Secondary | ICD-10-CM

## 2018-12-29 NOTE — Therapy (Signed)
[facility] Outpatient Rehabilitation [facility] [address] [place], [place], [address] Phone: [phone]   Fax:  [phone]  Physical Therapy Treatment  Patient Details  Name: Sheri Malone MRN: [id number] Date of Birth: 10-14-1986 Referring Provider (PT): George Hugh, NP   Encounter Date: 12/29/2018  PT End of Session - 12/29/18 0935    Visit Number  6    Number of Visits  9    Date for PT Re-Evaluation  01/07/19    Authorization Type  UHC 60 visit limit    PT Start Time  0935    PT Stop Time  1023    PT Time Calculation (min)  48 min    Activity Tolerance  Patient tolerated treatment well    Behavior During Therapy  [facility] for tasks assessed/performed       Past Medical History:  Diagnosis Date  . Abnormal Pap smear   . Asthma   . GERD (gastroesophageal reflux disease)   . Gestational HTN   . Headache(784.0)   . History of ectopic pregnancy   . History of gestational diabetes   . Low hemoglobin    on ironspan  . Rhinitis   . Vaginal Pap smear, abnormal     Past Surgical History:  Procedure Laterality Date  . CESAREAN SECTION N/A 05/20/2013   Procedure: CESAREAN SECTION;  Surgeon: Genia Del, MD;  Location: [facility];  Service: Obstetrics;  Laterality: N/A;  primary  . CESAREAN SECTION N/A 02/04/2018   Procedure: Repeat CESAREAN SECTION;  Surgeon: Noland Fordyce, MD;  Location: [facility] BIRTHING SUITES;  Service: Obstetrics;  Laterality: N/A;  EDD: 02/16/18  . CHOLECYSTECTOMY N/A 09/28/2018   Procedure: LAPAROSCOPIC CHOLECYSTECTOMY;  Surgeon: Abigail Miyamoto, MD;  Location: [facility] OR;  Service: General;  Laterality: N/A;  . DILATION AND CURETTAGE OF UTERUS  2009  . SALPINGOOPHORECTOMY  2009   left  . UNILATERAL SALPINGECTOMY Right 02/04/2018   Procedure: UNILATERAL SALPINGECTOMY;  Surgeon: Noland Fordyce, MD;  Location: [facility] BIRTHING SUITES;  Service: Obstetrics;  Laterality: Right;  . WISDOM TOOTH EXTRACTION      There were no vitals filed  for this visit.  Subjective Assessment - 12/29/18 0935    Subjective  not feeling distal symptoms as bad. Denies pain in neck/shoulder.     Patient Stated Goals  decreae pain/HA    Currently in Pain?  No/denies                       [facility] Adult PT Treatment/Exercise - 12/29/18 0001      Neck Exercises: Standing   Left / Chop Limitations  squat to floor to wide row- 2 10lb kettle bell    Other Standing Exercises  biceps curl 2 10 lb kettle bell    Other Standing Exercises  triceps extension      Neck Exercises: Prone   Shoulder Extension Limitations  retraction + extension    Upper Extremity Flexion with Stabilization  15 reps    UE Flexion with Stabilization Limitations  bent over on table- single arm    Other Prone Exercise  scapular retraction; retraction + IR liftoff 1lb    Other Prone Exercise  retraction+extension+abduction 1lb      Moist Heat Therapy   Number Minutes Moist Heat  10 Minutes    Moist Heat Location  Cervical      Manual Therapy   Joint Mobilization  gr 3 first rib depression mobs    Soft tissue mobilization  Lt  upper traps STM & stretching                  PT Long Term Goals - 12/17/18 0845      PT LONG TERM GOAL #1   Title  Pt will be able to utilize stretches and postures at work to decrease upper trap tension    Status  On-going      PT LONG TERM GOAL #2   Title  pt will be able to hold son without increase in upper trap pain    Status  On-going      PT LONG TERM GOAL #3   Title  GHJ ER strength to 5/5 to indicate necessary strength for postural support     Status  On-going      PT LONG TERM GOAL #4   Title  FOTO to 36% limited    Status  On-going            Plan - 12/29/18 1015    Clinical Impression Statement  Increased lifting weight and added triceps work to [employer]. Mild N/T in hand when trigger point in upper trap found and reduced with manual treatment.     PT Treatment/Interventions  ADLs/Self Care Home  Management;Cryotherapy;Electrical Stimulation;Traction;Therapeutic activities;Therapeutic exercise;Patient/family education;Manual techniques;Passive range of motion;Taping;Dry needling    PT Next Visit Plan  DN PRN, functional weight lifting- has a 19lb infant, discuss ERO/D/C- POC ends 3/20    PT Home Exercise Plan  scap retraction, upper trap stretch, band work through the day, ext rot red tband, extension with abd pull on tband; first rib depression; triceps ext; upright row;     Consulted and Agree with Plan of Care  Patient       Patient will benefit from skilled therapeutic intervention in order to improve the following deficits and impairments:  Pain, Improper body mechanics, Impaired sensation, Increased muscle spasms, Postural dysfunction, Decreased strength, Decreased activity tolerance, Decreased endurance, Impaired flexibility  Visit Diagnosis: Cervicalgia  Abnormal posture     Problem List Patient Active Problem List   Diagnosis Date Noted  . Allergic rhinitis 11/23/2016  . Allergic conjunctivitis 01/17/2016  . Mild intermittent asthma 01/17/2016  . Dermatitis, eczematoid 06/21/2015  . H/O cesarean section 06/21/2015  . History of diabetes mellitus arising in pregnancy 06/21/2015  . Headache, migraine 06/21/2015    Bruno Leach C. Shayley Medlin PT, DPT 12/29/18 10:17 AM   [facility] Outpatient Rehabilitation [facility] [address] [place], [place], [address] Phone: [phone]   Fax:  [phone]  Name: Sheri Malone MRN: [id number] Date of Birth: September 27, 1986

## 2019-01-03 ENCOUNTER — Ambulatory Visit: Payer: 59 | Admitting: Physical Therapy

## 2019-01-03 ENCOUNTER — Other Ambulatory Visit: Payer: Self-pay

## 2019-01-03 ENCOUNTER — Encounter: Payer: Self-pay | Admitting: Physical Therapy

## 2019-01-03 DIAGNOSIS — M542 Cervicalgia: Secondary | ICD-10-CM | POA: Diagnosis not present

## 2019-01-03 DIAGNOSIS — R293 Abnormal posture: Secondary | ICD-10-CM

## 2019-01-03 NOTE — Therapy (Addendum)
[place], [place], [address] Phone: [phone]   Fax:  [phone]  Physical Therapy Treatment/Discharge  Patient Details  Name: Sheri Malone MRN: [id number] Date of Birth: 04-13-1986 Referring Provider (PT): Venancio Poisson, NP   Encounter Date: 01/03/2019  PT End of Session - 01/03/19 0804    Visit Number  7    Number of Visits  9    Date for PT Re-Evaluation  01/07/19    Authorization Type  [employer] 60 visit limit    PT Start Time  0805    PT Stop Time  0854    PT Time Calculation (min)  49 min    Activity Tolerance  Patient tolerated treatment well       Past Medical History:  Diagnosis Date  . Abnormal Pap smear   . Asthma   . GERD (gastroesophageal reflux disease)   . Gestational HTN   . Headache(784.0)   . History of ectopic pregnancy   . History of gestational diabetes   . Low hemoglobin    on ironspan  . Rhinitis   . Vaginal Pap smear, abnormal     Past Surgical History:  Procedure Laterality Date  . CESAREAN SECTION N/A 05/20/2013   Procedure: CESAREAN SECTION;  Surgeon: Princess Bruins, MD;  Location: [facility];  Service: Obstetrics;  Laterality: N/A;  primary  . CESAREAN SECTION N/A 02/04/2018   Procedure: Repeat CESAREAN SECTION;  Surgeon: Aloha Gell, MD;  Location: [facility];  Service: Obstetrics;  Laterality: N/A;  EDD: 02/16/18  . CHOLECYSTECTOMY N/A 09/28/2018   Procedure: LAPAROSCOPIC CHOLECYSTECTOMY;  Surgeon: Coralie Keens, MD;  Location: [facility];  Service: General;  Laterality: N/A;  . DILATION AND CURETTAGE OF UTERUS  2009  . SALPINGOOPHORECTOMY  2009   left  . UNILATERAL SALPINGECTOMY Right 02/04/2018   Procedure: UNILATERAL SALPINGECTOMY;  Surgeon: Aloha Gell, MD;  Location: [facility];  Service: Obstetrics;  Laterality: Right;  . WISDOM TOOTH EXTRACTION      There were no vitals filed for this visit.  Subjective Assessment - 01/03/19  0805    Subjective  Pt reports she is doing pretty good, no pain this AM, only stiffness and the stretches took it away    Patient Stated Goals  decreae pain/HA    Currently in Pain?  No/denies                       [facility] Adult PT Treatment/Exercise - 01/03/19 0001      Neck Exercises: Machines for Strengthening   UBE (Upper Arm Bike)  retro 4 min L1      Neck Exercises: Standing   Upper Extremity D1  Flexion;20 reps;Theraband   each side   Theraband Level (UE D1)  Level 3 (Green)    Other Standing Exercises  2x10, 1# shoulder flex to horizontal abduction and down, then reverse      Neck Exercises: Supine   Neck Retraction  20 reps;3 secs   pressing into the pillow     Neck Exercises: Prone   Other Prone Exercise  2x15 T's at corner of bed, palms down and thumbs up.       Moist Heat Therapy   Number Minutes Moist Heat  10 Minutes    Moist Heat Location  Cervical      Neck Exercises: Stretches   Other Neck Stretches  supine over bolster at thoracic level stretches  PT Long Term Goals - 12/17/18 0845      PT LONG TERM GOAL #1   Title  Pt will be able to utilize stretches and postures at work to decrease upper trap tension    Status  On-going      PT LONG TERM GOAL #2   Title  pt will be able to hold son without increase in upper trap pain    Status  On-going      PT LONG TERM GOAL #3   Title  GHJ ER strength to 5/5 to indicate necessary strength for postural support     Status  On-going      PT LONG TERM GOAL #4   Title  FOTO to 36% limited    Status  On-going            Plan - 01/03/19 0809    Clinical Impression Statement  Pt is pleased with her progress so far.  She tolerated more resistance with tx today. If she continues to do well we will look at discharge to HEP this Friday.     Rehab Potential  Good    PT Frequency  2x / week    PT Duration  4 weeks    PT Treatment/Interventions  ADLs/Self Care Home  Management;Cryotherapy;Electrical Stimulation;Traction;Therapeutic activities;Therapeutic exercise;Patient/family education;Manual techniques;Passive range of motion;Taping;Dry needling    PT Next Visit Plan  assess goals and discharge       Patient will benefit from skilled therapeutic intervention in order to improve the following deficits and impairments:  Pain, Improper body mechanics, Impaired sensation, Increased muscle spasms, Postural dysfunction, Decreased strength, Decreased activity tolerance, Decreased endurance, Impaired flexibility  Visit Diagnosis: Cervicalgia  Abnormal posture     Problem List Patient Active Problem List   Diagnosis Date Noted  . Allergic rhinitis 11/23/2016  . Allergic conjunctivitis 01/17/2016  . Mild intermittent asthma 01/17/2016  . Dermatitis, eczematoid 06/21/2015  . H/O cesarean section 06/21/2015  . History of diabetes mellitus arising in pregnancy 06/21/2015  . Headache, migraine 06/21/2015    Boneta Lucks rPT  01/03/2019, 8:41 AM  [facility] [address] [place], [place], [address] Phone: [phone]   Fax:  [phone]  Name: Sheri Malone MRN: [id number] Date of Birth: 06-27-1986   PHYSICAL THERAPY DISCHARGE SUMMARY  Visits from Start of Care: 7  Current functional level related to goals / functional outcomes: unknown   Remaining deficits: unknown   Education / Equipment: HEP Plan:                                                    Patient goals were not met. Patient is being discharged due to not returning since the last visit. Pt was placed on hold due to closures with covid.  Message was left for her to call if she needed to return.  ?????     Jeral Pinch, PT 06/14/19 12:11 PM

## 2019-01-07 ENCOUNTER — Ambulatory Visit: Payer: 59 | Admitting: Physical Therapy

## 2019-01-10 ENCOUNTER — Ambulatory Visit: Payer: 59 | Admitting: Physical Therapy

## 2019-01-14 ENCOUNTER — Encounter: Payer: 59 | Admitting: Physical Therapy

## 2019-01-17 ENCOUNTER — Ambulatory Visit: Payer: 59 | Admitting: Physical Therapy

## 2019-01-25 ENCOUNTER — Telehealth: Payer: Self-pay | Admitting: Physical Therapy

## 2019-01-25 NOTE — Telephone Encounter (Signed)
Taliyah Bilderback was contacted today regarding the temporary reduction of OP Rehab Services due to concerns for community transmission of Covid-19.   Therapist advised the patient to continue to perform their HEP and assured they had no unanswered questions at this time.  The patient expressed interest in being contacted for an e-visit, virtual check in, or telehealth visit to continue their POC care, when those services become available. This interest was not for her personal POC and was for her 67 month old son [place]. The pediatric team was notified with the information.    Outpatient Rehabilitation Services will follow up with her when the visits are available.

## 2019-02-02 DIAGNOSIS — Z131 Encounter for screening for diabetes mellitus: Secondary | ICD-10-CM | POA: Diagnosis not present

## 2019-02-02 DIAGNOSIS — Z130 Encounter for screening for diseases of the blood and blood-forming organs and certain disorders involving the immune mechanism: Secondary | ICD-10-CM | POA: Diagnosis not present

## 2019-02-02 DIAGNOSIS — Z1329 Encounter for screening for other suspected endocrine disorder: Secondary | ICD-10-CM | POA: Diagnosis not present

## 2019-02-02 DIAGNOSIS — Z13220 Encounter for screening for lipoid disorders: Secondary | ICD-10-CM | POA: Diagnosis not present

## 2019-02-02 DIAGNOSIS — Z0000 Encounter for general adult medical examination without abnormal findings: Secondary | ICD-10-CM | POA: Diagnosis not present

## 2019-02-02 DIAGNOSIS — Z118 Encounter for screening for other infectious and parasitic diseases: Secondary | ICD-10-CM | POA: Diagnosis not present

## 2019-02-02 DIAGNOSIS — N898 Other specified noninflammatory disorders of vagina: Secondary | ICD-10-CM | POA: Diagnosis not present

## 2019-02-02 DIAGNOSIS — N920 Excessive and frequent menstruation with regular cycle: Secondary | ICD-10-CM | POA: Diagnosis not present

## 2019-02-07 DIAGNOSIS — Z6837 Body mass index (BMI) 37.0-37.9, adult: Secondary | ICD-10-CM | POA: Diagnosis not present

## 2019-02-07 DIAGNOSIS — Z713 Dietary counseling and surveillance: Secondary | ICD-10-CM | POA: Diagnosis not present

## 2019-02-21 ENCOUNTER — Telehealth: Payer: Self-pay | Admitting: Physical Therapy

## 2019-02-21 NOTE — Telephone Encounter (Signed)
Left VM asking pt if she felt that she needed a f/u as she was near d/c at her last appointment. Requested that she contact us with any questions.  Allisyn Kunz C. Kegan Shepardson PT, DPT 02/21/19 1:51 PM

## 2019-02-25 DIAGNOSIS — H1045 Other chronic allergic conjunctivitis: Secondary | ICD-10-CM | POA: Diagnosis not present

## 2019-02-25 DIAGNOSIS — R05 Cough: Secondary | ICD-10-CM | POA: Diagnosis not present

## 2019-02-25 DIAGNOSIS — J3089 Other allergic rhinitis: Secondary | ICD-10-CM | POA: Diagnosis not present

## 2019-02-25 DIAGNOSIS — Z91013 Allergy to seafood: Secondary | ICD-10-CM | POA: Diagnosis not present

## 2019-03-11 ENCOUNTER — Ambulatory Visit: Payer: 59 | Admitting: Podiatry

## 2019-03-11 ENCOUNTER — Other Ambulatory Visit: Payer: Self-pay

## 2019-03-11 ENCOUNTER — Encounter: Payer: Self-pay | Admitting: Podiatry

## 2019-03-11 VITALS — BP 125/85 | HR 92 | Temp 97.9°F | Resp 16

## 2019-03-11 DIAGNOSIS — L600 Ingrowing nail: Secondary | ICD-10-CM | POA: Diagnosis not present

## 2019-03-11 DIAGNOSIS — Z713 Dietary counseling and surveillance: Secondary | ICD-10-CM | POA: Diagnosis not present

## 2019-03-11 DIAGNOSIS — Z6834 Body mass index (BMI) 34.0-34.9, adult: Secondary | ICD-10-CM | POA: Diagnosis not present

## 2019-03-11 MED ORDER — NEOMYCIN-POLYMYXIN-HC 3.5-10000-1 OT SOLN: OTIC | 1 refills | Status: DC

## 2019-03-11 NOTE — Progress Notes (Signed)
   Subjective:    Patient ID: Sheri Malone, female    DOB: 10/20/1986, 33 y.o.   MRN: [id number]  HPI    Review of Systems  All other systems reviewed and are negative.      Objective:   Physical Exam        Assessment & Plan:

## 2019-03-11 NOTE — Patient Instructions (Signed)

## 2019-03-11 NOTE — Progress Notes (Signed)
Subjective:   Patient ID: Sheri Malone, female   DOB: 33 y.o.   MRN: [id number]   HPI Patient states she has had a painful ingrown toenail left over right hallux that is been going on a long time and recently it is increasingly hard for her to take care of.  Patient states she tries trimming soaking without relief and patient does not smoke and likes to be active   Review of Systems  All other systems reviewed and are negative.       Objective:  Physical Exam Vitals signs and nursing note reviewed.  Constitutional:      Appearance: She is well-developed.  Pulmonary:     Effort: Pulmonary effort is normal.  Musculoskeletal: Normal range of motion.  Skin:    General: Skin is warm.  Neurological:     Mental Status: She is alert.     Neurovascular status found to be intact muscle strength is adequate range of motion within normal limits.  Patient is found to have incurvated lateral border hallux of both feet that are sore when pressed and make shoe gear difficult and is found to have no active drainage or redness noted at the current time.  Patient has good digital perfusion and is well oriented x3     Assessment:  Ingrown toenail deformity hallux left over right lateral border     Plan:  H&P condition reviewed and recommended focusing on the left one today.  Patient wants procedure and read consent form and signed understanding risk and today I infiltrated the left hallux 60 mg like Marcaine mixture sterile prep applied to the toe using sterile instrumentation remove lateral border exposed matrix and applied phenol 3 applications 30 seconds followed by alcohol lavage and sterile dressing.  Gave instructions on soaks and to leave dressings on 24 hours but take them off earlier if throbbing were to occur and also wrote prescription for drops with instructions on usage and encouraged to call with questions

## 2019-09-28 ENCOUNTER — Other Ambulatory Visit: Payer: Self-pay

## 2019-09-28 ENCOUNTER — Ambulatory Visit (INDEPENDENT_AMBULATORY_CARE_PROVIDER_SITE_OTHER): Payer: 59 | Admitting: Family Medicine

## 2019-09-28 ENCOUNTER — Encounter: Payer: Self-pay | Admitting: Family Medicine

## 2019-09-28 DIAGNOSIS — J0140 Acute pansinusitis, unspecified: Secondary | ICD-10-CM | POA: Diagnosis not present

## 2019-09-28 DIAGNOSIS — Z20822 Contact with and (suspected) exposure to covid-19: Secondary | ICD-10-CM

## 2019-09-28 DIAGNOSIS — Z20828 Contact with and (suspected) exposure to other viral communicable diseases: Secondary | ICD-10-CM

## 2019-09-28 MED ORDER — AMOXICILLIN-POT CLAVULANATE 875-125 MG PO TABS: 1.0000 | ORAL_TABLET | Freq: Two times a day (BID) | ORAL | 0 refills | Status: AC

## 2019-09-28 NOTE — Patient Instructions (Signed)
Continue to work from home, but remain quarantined until testing is returned.  This information is directly available on the CDC website: RunningShows.co.za.html    Source:CDC Reference to specific commercial products, manufacturers, companies, or trademarks does not constitute its endorsement or recommendation by the [place], [place], or Centers for [employer] and Prevention.

## 2019-09-28 NOTE — Progress Notes (Signed)
Name: Sheri Malone   MRN: [id number]    DOB: 1986/04/17   Date:09/28/2019       Progress Note  Subjective  Chief Complaint  Chief Complaint  Patient presents with  . URI    cough,congestion,nose runny, facial pressure    I connected with  [name]  on 09/28/19 at 10:20 AM EST by a video enabled telemedicine application and verified that I am speaking with the correct person using two identifiers.  I discussed the limitations of evaluation and management by telemedicine and the availability of in person appointments. The patient expressed understanding and agreed to proceed. Staff also discussed with the patient that there may be a patient responsible charge related to this service. Patient Location: Home Provider Location: Office Additional Individuals present: None  HPI  Pt presents with concern for respiratory illness - she had a "head cold" for about 8 days, then over the last 3-4 days she has been having more sinus congestion, sinus headache and pressure behind the eyes.  She also has a slight cough.  Denies fevers/chills, shortness of breath, wheezing, chest pain/tightness, NVD, changes in taste/smell. - Her son did have recent COVID-19 testing and these results are still pending.  She notes that there is no known exposure, though her son does go to daycare. 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  - She is using flonase, azelastine, using PRN mucinex-DM.  Tried tessalon that she had at home and it did not help.  Patient Active Problem List   Diagnosis Date Noted  . Allergic rhinitis 11/23/2016  . Allergic conjunctivitis 01/17/2016  . Mild intermittent asthma 01/17/2016  . Dermatitis, eczematoid 06/21/2015  . H/O cesarean section 06/21/2015  . History of diabetes mellitus arising in pregnancy 06/21/2015  . Headache, migraine 06/21/2015    Social History   Tobacco Use  . Smoking status: Never Smoker  . Smokeless  tobacco: Never Used  Substance Use Topics  . Alcohol use: No    Alcohol/week: 0.0 standard drinks    Frequency: Never     Current Outpatient Medications:  .  albuterol (PROVENTIL HFA;VENTOLIN HFA) 108 (90 Base) MCG/ACT inhaler, Inhale 1 puff into the lungs every 6 (six) hours as needed for wheezing or shortness of breath., Disp: , Rfl:  .  azelastine (ASTELIN) 0.1 % nasal spray, INHALE 1 2 PUFFS IN EACH NOSTRIL TWICE A DAY, Disp: , Rfl:  .  azelastine (OPTIVAR) 0.05 % ophthalmic solution, PUT 1 DROP INTO AFFECTED EYE 2 TIMES A DAY, Disp: , Rfl:  .  EPINEPHrine (EPIPEN 2-PAK) 0.3 mg/0.3 mL IJ SOAJ injection, Inject 0.3 mLs (0.3 mg total) into the muscle once., Disp: 1 Device, Rfl: 0 .  FLOVENT HFA 110 MCG/ACT inhaler, USE 2 PUFFS TWICE A DAY, Disp: , Rfl:  .  fluticasone (FLONASE) 50 MCG/ACT nasal spray, INHALE 1 2 SPRAYS IN EACH NOSTRIL ONCE A DAY, Disp: , Rfl:  .  ibuprofen (ADVIL,MOTRIN) 800 MG tablet, Take 1 tablet (800 mg total) by mouth every 8 (eight) hours as needed for moderate pain., Disp: 90 tablet, Rfl: 0 .  JUNEL 1/20 1-20 MG-MCG tablet, Take 1 tablet by mouth daily., Disp: , Rfl:  .  levocetirizine (XYZAL) 5 MG tablet, Take 5 mg by mouth every evening., Disp: , Rfl:  .  montelukast (SINGULAIR) 10 MG tablet, Take 10 mg by mouth every evening., Disp: , Rfl:  .  neomycin-polymyxin-hydrocortisone (CORTISPORIN)  OTIC solution, Apply 1-2 drops to toe after soaking BID, Disp: 10 mL, Rfl: 1 .  pantoprazole (PROTONIX) 40 MG tablet, Take 40 mg by mouth daily., Disp: , Rfl:  .  Spacer/Aero-Holding Chambers (OPTICHAMBER DIAMOND) MISC, USE AS DIRECTED FOR INHALATION FOR 30 DAYS, Disp: , Rfl:  .  benzonatate (TESSALON) 100 MG capsule, Take 1-2 capsules (100-200 mg total) by mouth 3 (three) times daily as needed for cough. (Patient not taking: Reported on 09/28/2019), Disp: 40 capsule, Rfl: 0 .  phentermine (ADIPEX-P) 37.5 MG tablet, TAKE 1 (ONE) TABLET EVERY MORNING, Disp: , Rfl:  .  Prenat-Fe  Carbonyl-FA-Omega 3 (ONE-A-DAY WOMENS PRENATAL 1 PO), Take 1 tablet by mouth daily., Disp: , Rfl:   Allergies  Allergen Reactions  . Dust Mite Mixed Allergen Ext [Mite (D. Farinae)] Anaphylaxis  . Other Anaphylaxis and Other (See Comments)    DUST ROACH    I personally reviewed active problem list, medication list, allergies, notes from last encounter, lab results with the patient/caregiver today.  ROS  Ten systems reviewed and is negative except as mentioned in HPI  Objective  Virtual encounter, vitals not obtained.  There is no height or weight on file to calculate BMI.  Nursing Note and Vital Signs reviewed.  Physical Exam  Constitutional: Patient appears well-developed and well-nourished. No distress.  HENT: Head: Normocephalic and atraumatic.  Pt palpates maxillary and frontal sinuses and endorses tenderness bilaterally.  Neck: Normal range of motion. Pulmonary/Chest: Effort normal. No respiratory distress. Speaking in complete sentences Neurological: Pt is alert and oriented to person, place, and time. Coordination, speech and gait are normal.  Psychiatric: Patient has a normal mood and affect. behavior is normal. Judgment and thought content normal.  No results found for this or any previous visit (from the past 72 hour(s)).  Assessment & Plan  1. Acute non-recurrent pansinusitis - amoxicillin-clavulanate (AUGMENTIN) 875-125 MG tablet; Take 1 tablet by mouth 2 (two) times daily for 10 days.  Dispense: 20 tablet; Refill: 0 - symptomatic care along with empiric treatment as above.  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  She is able to work from home and quarantine.  2. Suspected COVID-19 virus infection - Novel Coronavirus, NAA ([facility])  -Red flags and when to present for emergency care or RTC including fever >101.59F, chest pain, shortness of breath, new/worsening/un-resolving  symptoms, reviewed with patient at time of visit. Follow up and care instructions discussed and provided in AVS. - I discussed the assessment and treatment plan with the patient. The patient was provided an opportunity to ask questions and all were answered. The patient agreed with the plan and demonstrated an understanding of the instructions.  I provided 12 minutes of non-face-to-face time during this encounter.  Doren Custard, FNP

## 2019-09-30 LAB — NOVEL CORONAVIRUS, NAA: SARS-CoV-2, NAA: DETECTED — AB

## 2019-10-05 ENCOUNTER — Encounter: Payer: Self-pay | Admitting: Family Medicine

## 2019-10-05 ENCOUNTER — Ambulatory Visit
Admission: RE | Admit: 2019-10-05 | Discharge: 2019-10-05 | Disposition: A | Discharge status: Home / Self Care | Payer: 59 | Source: Ambulatory Visit | Attending: Family Medicine | Admitting: Family Medicine

## 2019-10-05 ENCOUNTER — Other Ambulatory Visit: Payer: Self-pay

## 2019-10-05 ENCOUNTER — Ambulatory Visit (INDEPENDENT_AMBULATORY_CARE_PROVIDER_SITE_OTHER): Payer: 59 | Admitting: Family Medicine

## 2019-10-05 VITALS — Ht 63.0 in | Wt 203.0 lb

## 2019-10-05 DIAGNOSIS — J208 Acute bronchitis due to other specified organisms: Secondary | ICD-10-CM

## 2019-10-05 DIAGNOSIS — J4521 Mild intermittent asthma with (acute) exacerbation: Secondary | ICD-10-CM

## 2019-10-05 DIAGNOSIS — R05 Cough: Secondary | ICD-10-CM | POA: Insufficient documentation

## 2019-10-05 DIAGNOSIS — R0602 Shortness of breath: Secondary | ICD-10-CM

## 2019-10-05 DIAGNOSIS — J0140 Acute pansinusitis, unspecified: Secondary | ICD-10-CM

## 2019-10-05 DIAGNOSIS — R059 Cough, unspecified: Secondary | ICD-10-CM

## 2019-10-05 DIAGNOSIS — U071 COVID-19: Secondary | ICD-10-CM | POA: Insufficient documentation

## 2019-10-05 MED ORDER — BENZONATATE 100 MG PO CAPS: 100.0000 mg | ORAL_CAPSULE | Freq: Three times a day (TID) | ORAL | 1 refills | Status: DC | PRN

## 2019-10-05 MED ORDER — PREDNISONE 20 MG PO TABS: ORAL_TABLET | ORAL | 0 refills | Status: DC

## 2019-10-05 MED ORDER — IPRATROPIUM-ALBUTEROL 0.5-2.5 (3) MG/3ML IN SOLN: 3.0000 mL | Freq: Four times a day (QID) | RESPIRATORY_TRACT | 1 refills | Status: DC | PRN

## 2019-10-05 MED ORDER — NEBULIZER/TUBING/MOUTHPIECE KIT: PACK | 0 refills | Status: DC

## 2019-10-05 MED ORDER — AZITHROMYCIN 250 MG PO TABS: 250.0000 mg | ORAL_TABLET | Freq: Every day | ORAL | 0 refills | Status: DC

## 2019-10-05 NOTE — Progress Notes (Signed)
Name: Sheri Malone   MRN: [id number]    DOB: 1985-12-12   Date:10/05/2019       Progress Note  Subjective:    Chief Complaint  Chief Complaint  Patient presents with  . COVID    PT STILL HAVING COUGH, SOMETIMES IT GETS BAD, TAKING A DEEP BREATH IT HURTS    I connected with  [name]  on 10/05/19 at  3:20 PM EST by a video enabled telemedicine application and verified that I am speaking with the correct person using two identifiers.  I discussed the limitations of evaluation and management by telemedicine and the availability of in person appointments. The patient expressed understanding and agreed to proceed. Staff also discussed with the patient that there may be a patient responsible charge related to this service. Patient Location: home Provider Location: [facility] Additional Individuals present: none  HPI Patient presents for follow-up of Covid and continued symptoms.  She initially was sick near the beginning of the month she presented to our clinic on December 9 about 1 week ago at that time had stated she had about 8 days of sinus symptoms, and was subsequently sent for Covid testing.  She tested positive and so did her son.  She was given antibiotics for sinus infection, her sinuses have improved she did experience some body aches, fatigue, sweats and chills she is having improved sinus symptoms, no fever, body aches improved she does have worsening chest congestion, productive cough, shortness of breath.  She also has history of severe environmental allergies and gets allergy shots and has asthma which she is on maintenance inhalers for. Pt is having worse cough. She is having some pain with very deep breathing and with exertion she has some dypnea.  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  She states that with normal and mild activity such as just walking  around the house, she is not SOB, CP, fatigue, near syncope.   She has two inhalers and they are still not helping her breathing and she feels like she can't get enough of a deep breath to get the meds in.  She takes Flovent inhaler taking 2 puffs BID, and has increased her use of Albuterol inhaler is a rescue, she has been trying 2-3x a day without much improve.  She states that it feels more like a bronchitis than an asthma exacerbation She has also been pushing a lot of fluids, resting, using Mucinex and her other allergy medications.    Patient Active Problem List   Diagnosis Date Noted  . Allergic rhinitis 11/23/2016  . Allergic conjunctivitis 01/17/2016  . Mild intermittent asthma 01/17/2016  . Dermatitis, eczematoid 06/21/2015  . H/O cesarean section 06/21/2015  . History of diabetes mellitus arising in pregnancy 06/21/2015  . Headache, migraine 06/21/2015    Social History   Tobacco Use  . Smoking status: Never Smoker  . Smokeless tobacco: Never Used  Substance Use Topics  . Alcohol use: No    Alcohol/week: 0.0 standard drinks     Current Outpatient Medications:  .  albuterol (PROVENTIL HFA;VENTOLIN HFA) 108 (90 Base) MCG/ACT inhaler, Inhale 1 puff into the lungs every 6 (six) hours as needed for wheezing or shortness of breath., Disp: , Rfl:  .  amoxicillin-clavulanate (AUGMENTIN) 875-125 MG tablet, Take 1 tablet by mouth 2 (two) times daily for 10  days., Disp: 20 tablet, Rfl: 0 .  azelastine (ASTELIN) 0.1 % nasal spray, INHALE 1 2 PUFFS IN EACH NOSTRIL TWICE A DAY, Disp: , Rfl:  .  azelastine (OPTIVAR) 0.05 % ophthalmic solution, PUT 1 DROP INTO AFFECTED EYE 2 TIMES A DAY, Disp: , Rfl:  .  EPINEPHrine (EPIPEN 2-PAK) 0.3 mg/0.3 mL IJ SOAJ injection, Inject 0.3 mLs (0.3 mg total) into the muscle once., Disp: 1 Device, Rfl: 0 .  FLOVENT HFA 110 MCG/ACT inhaler, USE 2 PUFFS TWICE A DAY, Disp: , Rfl:  .  fluticasone (FLONASE) 50 MCG/ACT nasal spray, INHALE 1 2 SPRAYS IN EACH  NOSTRIL ONCE A DAY, Disp: , Rfl:  .  ibuprofen (ADVIL,MOTRIN) 800 MG tablet, Take 1 tablet (800 mg total) by mouth every 8 (eight) hours as needed for moderate pain., Disp: 90 tablet, Rfl: 0 .  JUNEL 1/20 1-20 MG-MCG tablet, Take 1 tablet by mouth daily., Disp: , Rfl:  .  levocetirizine (XYZAL) 5 MG tablet, Take 5 mg by mouth every evening., Disp: , Rfl:  .  neomycin-polymyxin-hydrocortisone (CORTISPORIN) OTIC solution, Apply 1-2 drops to toe after soaking BID, Disp: 10 mL, Rfl: 1 .  pantoprazole (PROTONIX) 40 MG tablet, Take 40 mg by mouth daily., Disp: , Rfl:  .  benzonatate (TESSALON) 100 MG capsule, Take 1-2 capsules (100-200 mg total) by mouth 3 (three) times daily as needed for cough. (Patient not taking: Reported on 09/28/2019), Disp: 40 capsule, Rfl: 0 .  montelukast (SINGULAIR) 10 MG tablet, Take 10 mg by mouth every evening., Disp: , Rfl:  .  phentermine (ADIPEX-P) 37.5 MG tablet, TAKE 1 (ONE) TABLET EVERY MORNING, Disp: , Rfl:  .  Prenat-Fe Carbonyl-FA-Omega 3 (ONE-A-DAY WOMENS PRENATAL 1 PO), Take 1 tablet by mouth daily., Disp: , Rfl:  .  Spacer/Aero-Holding Chambers (OPTICHAMBER DIAMOND) MISC, USE AS DIRECTED FOR INHALATION FOR 30 DAYS, Disp: , Rfl:   Allergies  Allergen Reactions  . Dust Mite Mixed Allergen Ext [Mite (D. Farinae)] Anaphylaxis  . Other Anaphylaxis and Other (See Comments)    DUST ROACH    I personally reviewed active problem list, medication list, allergies, family history, social history, health maintenance, notes from last encounter, lab results, imaging with the patient/caregiver today.  Review of Systems  Constitutional: Negative.  Negative for appetite change, chills, diaphoresis and fever.  HENT: Negative.   Eyes: Negative.   Respiratory: Positive for cough, chest tightness, shortness of breath and wheezing. Negative for apnea, choking and stridor.   Cardiovascular: Negative.  Negative for palpitations and leg swelling.  Gastrointestinal: Negative.    Endocrine: Negative.   Genitourinary: Negative.   Musculoskeletal: Negative.   Skin: Negative.   Allergic/Immunologic: Negative.   Neurological: Negative.   Hematological: Negative.   Psychiatric/Behavioral: Negative.   All other systems reviewed and are negative.    Objective:   Virtual encounter, vitals limited, only able to obtain the following Today's Vitals   10/05/19 1442  Weight: 203 lb (92.1 kg)  Height: 5' 3"  (1.6 m)   Body mass index is 35.96 kg/m. Nursing Note and Vital Signs reviewed.  Physical Exam Vitals and nursing note reviewed.  Constitutional:      General: She is not in acute distress.    Appearance: Normal appearance. She is well-developed. She is not ill-appearing, toxic-appearing or diaphoretic.  HENT:     Head: Normocephalic and atraumatic.     Nose: Nose normal.  Eyes:     General:        Right eye: No  discharge.        Left eye: No discharge.     Conjunctiva/sclera: Conjunctivae normal.  Neck:     Trachea: No tracheal deviation.  Pulmonary:     Effort: Pulmonary effort is normal. No respiratory distress.     Breath sounds: No stridor.     Comments: No observed tachypnea, retractions, accessory muscle use.  Frequent coughing but able to speak in full and complete sentences most of the time no audible wheeze or stridor Skin:    Coloration: Skin is not jaundiced or pale.     Findings: No rash.  Neurological:     Mental Status: She is alert.     Motor: No abnormal muscle tone.     Coordination: Coordination normal.  Psychiatric:        Mood and Affect: Mood normal.        Behavior: Behavior normal.     PE limited by telephone encounter  No results found for this or any previous visit (from the past 72 hour(s)).  Assessment and Plan:     ICD-10-CM   1. 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 Acute non-recurrent pansinusitis  J01.40    improving sx  3. Acute bronchitis due to other specified organisms  J20.8 ipratropium-albuterol (DUONEB) 0.5-2.5 (3) MG/3ML SOLN    azithromycin (ZITHROMAX Z-PAK) 250 MG tablet    benzonatate (TESSALON) 100 MG capsule    Respiratory Therapy Supplies (NEBULIZER/TUBING/MOUTHPIECE) KIT    predniSONE (DELTASONE) 20 MG tablet  4. Shortness of breath  R06.02 DG Chest 2 View    ipratropium-albuterol (DUONEB) 0.5-2.5 (3) MG/3ML SOLN    azithromycin (ZITHROMAX Z-PAK) 250 MG tablet    predniSONE (DELTASONE) 20 MG tablet  5. Cough  R05 DG Chest 2 View    benzonatate (TESSALON) 100 MG capsule  6. Mild intermittent asthma with acute exacerbation  J45.21 ipratropium-albuterol (DUONEB) 0.5-2.5 (3) MG/3ML SOLN    benzonatate (TESSALON) 100 MG capsule    Respiratory Therapy Supplies (NEBULIZER/TUBING/MOUTHPIECE) KIT    predniSONE (DELTASONE) 20 MG tablet   steroid burst, nebs - trying to get machine and duonebs, SABA not helping    Patient presents with respiratory symptoms following Covid infection she is still out of work and isolating at home and will do so for the next week following a 21-day return to work guideline for more severe symptoms.  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  Patient has history of asthma, is having some bronchitis or reactive airway symptoms which we will treat with Z-Pak, steroids, nebulizer treatments and continued Mucinex, cough meds and her other allergy and supportive treatments.  I have asked her to go get a chest x-ray to make sure she has not developed any viral pneumonia.  If her symptoms do not improve with treatment outlined above she may need a stronger maintenance inhaler.  She was advised to do a 2-week follow-up if symptoms are not improving.  Or if she needs assessment sooner or for help returning to work I have invited her to do a follow-up visit with [name].   Patient advised  that from my standpoint if she has improving respiratory symptoms, remains afebrile, she has met the timeline for return to work and I am happy to write her a work note to clear her soon as those  requirements are met.  -Red flags and when to present for emergency care or RTC including chest pain, shortness of breath that is severe or not relieved with inhaler or nebulizer treatment, new/worsening/un-resolving symptoms, confusion, severe fatigue, near syncope, all reviewed with patient at time of visit. Follow up and care instructions discussed and provided in AVS. - I discussed the assessment and treatment plan with the patient. The patient was provided an opportunity to ask questions and all were answered. The patient agreed with the plan and demonstrated an understanding of the instructions.  I provided 20 minutes of non-face-to-face time during this encounter.  [name], PA-C 10/05/19 3:33 PM

## 2019-10-09 ENCOUNTER — Encounter: Payer: Self-pay | Admitting: Family Medicine

## 2019-10-09 DIAGNOSIS — B379 Candidiasis, unspecified: Secondary | ICD-10-CM

## 2019-10-10 MED ORDER — FLUCONAZOLE 150 MG PO TABS: 150.0000 mg | ORAL_TABLET | Freq: Once | ORAL | 0 refills | Status: AC

## 2020-01-18 ENCOUNTER — Ambulatory Visit: Payer: 59 | Admitting: Adult Health

## 2020-01-18 ENCOUNTER — Encounter: Payer: Self-pay | Admitting: Adult Health

## 2020-01-18 ENCOUNTER — Other Ambulatory Visit: Payer: Self-pay

## 2020-01-18 VITALS — BP 123/81 | HR 79 | Temp 97.7°F | Ht 63.0 in | Wt 198.0 lb

## 2020-01-18 DIAGNOSIS — G43109 Migraine with aura, not intractable, without status migrainosus: Secondary | ICD-10-CM | POA: Diagnosis not present

## 2020-01-18 MED ORDER — SUMATRIPTAN SUCCINATE 50 MG PO TABS: 50.0000 mg | ORAL_TABLET | ORAL | 0 refills | Status: DC | PRN

## 2020-01-18 NOTE — Patient Instructions (Signed)
Start sumatriptan 50mg  at onset of headache; may repeat x1 in 2 hrs if needed; do not use more than 200mg  in a 24 hr period  Please call office if you do not have benefit or unable to tolerate   Follow up in 3 months or call earlier if needed

## 2020-01-18 NOTE — Progress Notes (Addendum)
[facility]    [facility] provider:  Dr Jaynee Eagles Referring Provider: [name], PA-C Primary Care Physician:  [name], PA-C  CC:  Chief Complaint  Patient presents with  . Follow-up    RM 9, alone, cervicalgia, migraine, pt states she is doing well     Today, 01/18/2020, Sheri Malone is being seen today per patient request due to recent worsening of migraines.  She was previously seen in 08/2018 for follow-up regarding migraines and likely cervical strain.  At prior visits, she was pregnant and then breast-feeding therefore limited medication usage.  She would use Periactin for emergent relief with benefit.  Since prior visit, experiencing approximately 1 headache per month typically around menses.  She states on 01/04/2020, she was seen by the eye doctor and had eyes dilated with resultant severe migraine over the next 5 days.  Endorses recent severe migraine same symptoms as typical monthly migraines but more severe in intensity and lasting longer in duration.  Associated with photophobia, phonophobia and nausea and would typically worsen throughout the day with complete debilitation in the evening.  Typically unilateral pounding/pulsating sensation.  She also endorses blurred vision at the onset of migraines but resolves after a few seconds hence the reason for seeing eye doctor.  Migraines typically alleviated by Periactin and Excedrin but recent episode medication not beneficial.  Worsened by position changes, loud noise and light.  Denies any medication changes or medical history changes.  She received dry needling for likely cervical strain with great improvement.  No further concerns at this time.     History: Provided for reference purposes only Interval history 08/24/2018 JM: Patient is being seen today due to concerns of continuous bilateral neck pain that radiates down into bilateral shoulders with intermittent numbness sensation down left arm.  She does state that she  experiences a slight headache that radiates up into her occipital region but denies any further location.  She does endorse a muscle tightness greater on her left side located near her left shoulder and left side of neck.  Denies any type of traumatic event or injury that could have occurred.  She does not believe that it is related to sleeping habits or sleeping in a bad position.  She was seen at urgent care on 06/18/2018 where was recommended to take ibuprofen and Flexeril as it is felt to be muscle related.  She is unable to take Flexeril during the day due to increased fatigue and would take 5 mg tablet at night but did not gain benefit from this.  She does take the ibuprofen on occasion which will take the edge off but it does not last long.  She is continuing to breast-feed at this time therefore she tries to limit amount of medication use.  She has attempted to use heat with only minimal relief.  Per review of notes, patient has had this ongoing complaint and underwent imaging on 04/01/2017 which was negative. She does have a history of migraines for which she was taking Periactin during pregnancy but has since stopped due to continued breast-feeding and in the very beginning, there was one occurrence where she did make the baby drowsy.  She does endorse migraines approximately 1-2 times per month located behind her eye which worsens with fatigue or lack of calories and associated with photophobia, phonophobia, nausea and lightheadedness.  She has not noticed any worsening of these migraines since stopping the Periactin.  Interval history 12/15/2017 Dr. Jaynee Eagles:  Her migraines have improved.  She is in her second trimester. More related to sleep and fatigue due to pregnancy. She had a headache the other day. Discussed migraines can improve in pregnancy. The Periactin helps. Taking it when she has headaches helps if she takes it in the start of headache. Helps. Discussed migraines and menopause. Mother had  migraines, improved after menopause.    Initial consult visit 09/15/2017 Dr. Jaynee Eagles: Sheri Malone is a 34 y.o. female here as a referral from Dr. Lucio Edward for headaches. Patient is approx [redacted] weeks pregnant.  She has had right arm and neck pain, had normal c-spine xrays, She has a history of migraines starting in adolescence. Birth control helped. She has used Fioricet in the past. Headaches are worsening. A few months ago she had a shoulder injury and PT has helped. These headaches are different, not eating triggers it, not sleeping well triggers headaches. Mother with migraines. It can start anywhere in the head, behind the eyes, can be unilateral, she has light/sound sensitivity, being in the dark helps, positional laying down makes it worse, movement makes it worse, sitting straight in a dark place is the best. Worsening, weekly. Could last 2 days. She has headaches 20 days a month and over half are migrainous. She has associated nausea. She has vision changes with the migraines. No hearing changes, no pulsating in the ears.  Reviewed notes, labs and imaging from outside physicians, which showed: Personally reviewed images and agree with the following: There is no evidence of cervical spine fracture or prevertebral soft tissue swelling. Alignment is normal. No other significant bone abnormalities are identified. IMPRESSION: Negative cervical spine radiographs. CBC with anemia   Review of Systems: Patient complains of symptoms per HPI as well as the following symptoms: Headaches; pertinent negatives and positives per HPI. All others negative.   Social History   Socioeconomic History  . Marital status: Married    Spouse name: Not on file  . Number of children: Not on file  . Years of education: Not on file  . Highest education level: Not on file  Occupational History  . Not on file  Tobacco Use  . Smoking status: Never Smoker  . Smokeless tobacco: Never Used  Substance and Sexual Activity   . Alcohol use: No    Alcohol/week: 0.0 standard drinks  . Drug use: No  . Sexual activity: Yes    Partners: Male  Other Topics Concern  . Not on file  Social History Narrative   Lives at home with husband and daughter. Pregnant with baby boy due 02/16/18.   Right handed   Drinks 2 cups of caffeine daily   Social Determinants of Health   Financial Resource Strain:   . Difficulty of Paying Living Expenses:   Food Insecurity:   . Worried About Charity fundraiser in the Last Year:   . Arboriculturist in the Last Year:   Transportation Needs:   . Film/video editor (Medical):   Marland Kitchen Lack of Transportation (Non-Medical):   Physical Activity:   . Days of Exercise per Week:   . Minutes of Exercise per Session:   Stress:   . Feeling of Stress :   Social Connections:   . Frequency of Communication with Friends and Family:   . Frequency of Social Gatherings with Friends and Family:   . Attends Religious Services:   . Active Member of Clubs or Organizations:   . Attends Archivist Meetings:   Marland Kitchen Marital Status:  Intimate Partner Violence:   . Fear of Current or Ex-Partner:   . Emotionally Abused:   Marland Kitchen Physically Abused:   . Sexually Abused:     Family History  Problem Relation Age of Onset  . Diabetes Father   . Hypertension Father   . Cancer Maternal Grandfather        prostate  . Heart disease Paternal Grandfather   . Migraines Mother   . Hypertension Mother   . Breast cancer Mother   . Multiple sclerosis Sister     Past Medical History:  Diagnosis Date  . Abnormal Pap smear   . Asthma   . GERD (gastroesophageal reflux disease)   . Gestational HTN   . Headache(784.0)   . History of ectopic pregnancy   . History of gestational diabetes   . Low hemoglobin    on ironspan  . Rhinitis   . Vaginal Pap smear, abnormal     Past Surgical History:  Procedure Laterality Date  . CESAREAN SECTION N/A 05/20/2013   Procedure: CESAREAN SECTION;  Surgeon:  Princess Bruins, MD;  Location: [facility];  Service: Obstetrics;  Laterality: N/A;  primary  . CESAREAN SECTION N/A 02/04/2018   Procedure: Repeat CESAREAN SECTION;  Surgeon: Aloha Gell, MD;  Location: [facility];  Service: Obstetrics;  Laterality: N/A;  EDD: 02/16/18  . CHOLECYSTECTOMY N/A 09/28/2018   Procedure: LAPAROSCOPIC CHOLECYSTECTOMY;  Surgeon: Coralie Keens, MD;  Location: [facility];  Service: General;  Laterality: N/A;  . DILATION AND CURETTAGE OF UTERUS  2009  . SALPINGOOPHORECTOMY  2009   left  . UNILATERAL SALPINGECTOMY Right 02/04/2018   Procedure: UNILATERAL SALPINGECTOMY;  Surgeon: Aloha Gell, MD;  Location: [facility];  Service: Obstetrics;  Laterality: Right;  . WISDOM TOOTH EXTRACTION      Current Outpatient Medications  Medication Sig Dispense Refill  . albuterol (PROVENTIL HFA;VENTOLIN HFA) 108 (90 Base) MCG/ACT inhaler Inhale 1 puff into the lungs every 6 (six) hours as needed for wheezing or shortness of breath.    Marland Kitchen azelastine (ASTELIN) 0.1 % nasal spray INHALE 1 2 PUFFS IN EACH NOSTRIL TWICE A DAY    . azelastine (OPTIVAR) 0.05 % ophthalmic solution PUT 1 DROP INTO AFFECTED EYE 2 TIMES A DAY    . benzonatate (TESSALON) 100 MG capsule Take 1-2 capsules (100-200 mg total) by mouth 3 (three) times daily as needed for cough. 60 capsule 1  . EPINEPHrine (EPIPEN 2-PAK) 0.3 mg/0.3 mL IJ SOAJ injection Inject 0.3 mLs (0.3 mg total) into the muscle once. 1 Device 0  . FLOVENT HFA 110 MCG/ACT inhaler USE 2 PUFFS TWICE A DAY    . fluticasone (FLONASE) 50 MCG/ACT nasal spray INHALE 1 2 SPRAYS IN EACH NOSTRIL ONCE A DAY    . ibuprofen (ADVIL,MOTRIN) 800 MG tablet Take 1 tablet (800 mg total) by mouth every 8 (eight) hours as needed for moderate pain. 90 tablet 0  . ipratropium-albuterol (DUONEB) 0.5-2.5 (3) MG/3ML SOLN Take 3 mLs by nebulization every 6 (six) hours as needed (for wheeze, coughing spells, chest tightness, SOB). 180 mL 1  . JUNEL 1/20 1-20  MG-MCG tablet Take 1 tablet by mouth daily.    Marland Kitchen levocetirizine (XYZAL) 5 MG tablet Take 5 mg by mouth every evening.    . montelukast (SINGULAIR) 10 MG tablet Take 10 mg by mouth every evening.    . neomycin-polymyxin-hydrocortisone (CORTISPORIN) OTIC solution Apply 1-2 drops to toe after soaking BID 10 mL 1  . pantoprazole (PROTONIX) 40 MG tablet  Take 40 mg by mouth daily.    . phentermine (ADIPEX-P) 37.5 MG tablet TAKE 1 (ONE) TABLET EVERY MORNING    . predniSONE (DELTASONE) 20 MG tablet 2 tabs poqday 1-3, 1 tabs poqday 4-6 9 tablet 0  . Prenat-Fe Carbonyl-FA-Omega 3 (ONE-A-DAY WOMENS PRENATAL 1 PO) Take 1 tablet by mouth daily.    Marland Kitchen Respiratory Therapy Supplies (NEBULIZER/TUBING/MOUTHPIECE) KIT Disp one nebulizer machine, tubing set and mouthpiece kit 1 kit 0  . Spacer/Aero-Holding Chambers (OPTICHAMBER DIAMOND) MISC USE AS DIRECTED FOR INHALATION FOR 30 DAYS     No current facility-administered medications for this visit.    Allergies as of 01/18/2020 - Review Complete 10/05/2019  Allergen Reaction Noted  . 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 Other Anaphylaxis and Other (See Comments) 12/15/2017    Vitals: There were no vitals taken for this visit. Last Weight:  Wt Readings from Last 1 Encounters:  10/05/19 203 lb (92.1 kg)   Last Height:   Ht Readings from Last 1 Encounters:  10/05/19 5' 3"  (1.6 m)   Physical exam:  General: well developed, well nourished,  pleasant middle-aged African-American female, seated, in no evident distress Head: head normocephalic and atraumatic.   Neck: supple with no carotid or supraclavicular bruits Cardiovascular: regular rate and rhythm, no murmurs Musculoskeletal: no deformity Skin:  no rash/petichiae Vascular:  Normal pulses all extremities   Neurologic Exam Mental Status: Awake and fully alert.   Normal speech and language.  Oriented to place and time. Recent and remote memory intact. Attention  span, concentration and fund of knowledge appropriate. Mood and affect appropriate.  Cranial Nerves: Fundoscopic exam reveals sharp disc margins. Pupils equal, briskly reactive to light. Extraocular movements full without nystagmus. Visual fields full to confrontation. Hearing intact. Facial sensation intact. Face, tongue, palate moves normally and symmetrically.  Motor: Normal bulk and tone. Normal strength in all tested extremity muscles. Sensory.: intact to touch , pinprick , position and vibratory sensation.  Coordination: Rapid alternating movements normal in all extremities. Finger-to-nose and heel-to-shin performed accurately bilaterally. Gait and Station: Arises from chair without difficulty. Stance is normal. Gait demonstrates normal stride length and balance Reflexes: 1+ and symmetric. Toes downgoing.        Assessment/Plan:  Sheri Malone is a 34 year old female previously followed in this office by Dr. Jaynee Eagles for migraine management last seen 08/2018 requested office visit today due to recent onset of severe migraine lasting for 5 days.  Previously experiencing one migraine per month around menses alleviated by Excedrin and Periactin.  Placed on Periactin due to prior pregnancy and breast-feeding.  Experienced recent severe migraine on 01/04/2020 after having eyes dilated which was likely the cause of severe migraine.  Recommend initiating sumatriptan 50 mg as needed with migraine onset -may repeat in 2 hours x1 if needed.  Advised max dose 200 mg over 24 hours.  She is not currently pregnant, planning on getting pregnant or breast-feeding.  Previously on sumatriptan prior to pregnancy with benefit.  Advised to call office if no benefit for possible need of increased dose or change in therapy If migraines become more frequent not controlled on triptan, would recommend initiating preventative therapy Discussion regarding avoidance of migraine triggers and nondrug therapies to alleviate  migraine   Follow-up in 3 months or call earlier if needed  I spent 23 minutes of face-to-face and non-face-to-face time with patient.  This included previsit chart review, lab review, study review, order entry, electronic health record documentation,  patient education regarding recent severe migraine and likely etiologies, initiation of triptan, lifestyle changes and nondrug preventative and relief treatments     Frann Rider, AGNP-BC  [facility] [address] [place],  [address]  Phone [phone] Fax [phone] Note: This document was prepared with digital dictation and possible smart phrase technology. Any transcriptional errors that result from this process are unintentional.   Made any corrections needed, and agree with history, physical, neuro exam,assessment and plan as stated.     Sarina Ill, MD [facility]

## 2020-01-20 ENCOUNTER — Ambulatory Visit: Payer: 59 | Admitting: Family Medicine

## 2020-01-20 ENCOUNTER — Encounter: Payer: Self-pay | Admitting: Family Medicine

## 2020-01-20 ENCOUNTER — Other Ambulatory Visit: Payer: Self-pay

## 2020-01-20 VITALS — BP 108/72 | HR 92 | Temp 98.3°F | Resp 14 | Ht 63.0 in | Wt 200.0 lb

## 2020-01-20 DIAGNOSIS — E8881 Metabolic syndrome: Secondary | ICD-10-CM | POA: Diagnosis not present

## 2020-01-20 DIAGNOSIS — E66813 Obesity, class 3: Secondary | ICD-10-CM | POA: Insufficient documentation

## 2020-01-20 DIAGNOSIS — Z6835 Body mass index (BMI) 35.0-35.9, adult: Secondary | ICD-10-CM

## 2020-01-20 DIAGNOSIS — E669 Obesity, unspecified: Secondary | ICD-10-CM

## 2020-01-20 DIAGNOSIS — R7303 Prediabetes: Secondary | ICD-10-CM | POA: Diagnosis not present

## 2020-01-20 DIAGNOSIS — E88819 Insulin resistance, unspecified: Secondary | ICD-10-CM | POA: Insufficient documentation

## 2020-01-20 DIAGNOSIS — E559 Vitamin D deficiency, unspecified: Secondary | ICD-10-CM

## 2020-01-20 MED ORDER — METFORMIN HCL ER 500 MG PO TB24: 500.0000 mg | ORAL_TABLET | Freq: Every day | ORAL | 2 refills | Status: DC

## 2020-01-20 NOTE — Progress Notes (Signed)
Patient ID: Sheri Malone, female    DOB: 1986/09/25, 34 y.o.   MRN: [id number]  PCP: [name], PA-C  Chief Complaint  Patient presents with  . Follow-up    labs from another office  . Prediabetes    Subjective:   Sheri Malone is a 34 y.o. female, presents to clinic with CC of the following:  Pt presents to review abnormal labs done elsewhere.  She first had sx of blurry vision in Feb.  She got her eyes checked and prescription was not changed.   So she went to [facility] and they did a lot of labs showed prediabetes low vit d some insulin resistance.  She is here today to follow-up and to review with her PCP abnormal labs  She was put on a Vit D supplement and on a very low calorie diet which is tyrigtgering headaches and migbraines  Labs from march 7  -she has them printed with her she will allow [name] to get a copy and scanned into the chart.  I have seen the patient one time was last December for virtual encounter with concerns of Covid infection or viral URI symptoms.  She has not been in for routine care or lab work in a few years.  States that she does have a history of diabetes and obesity in her family. Her weight has been fairly stable for the last 1 to 2 years Wt Readings from Last 5 Encounters:  01/20/20 200 lb (90.7 kg)  01/18/20 198 lb (89.8 kg)  10/05/19 203 lb (92.1 kg)  09/22/18 203 lb 8 oz (92.3 kg)  09/13/18 206 lb 1.6 oz (93.5 kg)   BMI Readings from Last 5 Encounters:  01/20/20 35.43 kg/m  01/18/20 35.07 kg/m  10/05/19 35.96 kg/m  09/22/18 36.05 kg/m  09/13/18 36.51 kg/m   Last blood work done in clinic and available in this [place] record was 2019  Patient Active Problem List   Diagnosis Date Noted  . Allergic rhinitis 11/23/2016  . Allergic conjunctivitis 01/17/2016  . Mild intermittent asthma 01/17/2016  . Dermatitis, eczematoid 06/21/2015  . H/O cesarean section 06/21/2015  . History of diabetes mellitus arising in pregnancy  06/21/2015  . Headache, migraine 06/21/2015      Current Outpatient Medications:  .  albuterol (PROVENTIL HFA;VENTOLIN HFA) 108 (90 Base) MCG/ACT inhaler, Inhale 1 puff into the lungs every 6 (six) hours as needed for wheezing or shortness of breath., Disp: , Rfl:  .  azelastine (ASTELIN) 0.1 % nasal spray, INHALE 1 2 PUFFS IN EACH NOSTRIL TWICE A DAY, Disp: , Rfl:  .  azelastine (OPTIVAR) 0.05 % ophthalmic solution, PUT 1 DROP INTO AFFECTED EYE 2 TIMES A DAY, Disp: , Rfl:  .  EPINEPHrine (EPIPEN 2-PAK) 0.3 mg/0.3 mL IJ SOAJ injection, Inject 0.3 mLs (0.3 mg total) into the muscle once., Disp: 1 Device, Rfl: 0 .  FLOVENT HFA 110 MCG/ACT inhaler, USE 2 PUFFS TWICE A DAY, Disp: , Rfl:  .  fluticasone (FLONASE) 50 MCG/ACT nasal spray, INHALE 1 2 SPRAYS IN EACH NOSTRIL ONCE A DAY, Disp: , Rfl:  .  ipratropium-albuterol (DUONEB) 0.5-2.5 (3) MG/3ML SOLN, Take 3 mLs by nebulization every 6 (six) hours as needed (for wheeze, coughing spells, chest tightness, SOB)., Disp: 180 mL, Rfl: 1 .  JUNEL 1/20 1-20 MG-MCG tablet, Take 1 tablet by mouth daily., Disp: , Rfl:  .  levocetirizine (XYZAL) 5 MG tablet, Take 5 mg by mouth every evening., Disp: ,  Rfl:  .  montelukast (SINGULAIR) 10 MG tablet, Take 10 mg by mouth every evening., Disp: , Rfl:  .  pantoprazole (PROTONIX) 40 MG tablet, Take 40 mg by mouth daily., Disp: , Rfl:  .  SUMAtriptan (IMITREX) 50 MG tablet, Take 1 tablet (50 mg total) by mouth every 2 (two) hours as needed for migraine. May repeat in 2 hours if headache persists or recurs. Max dose 263m over 24 hr period, Disp: 10 tablet, Rfl: 0 .  Respiratory Therapy Supplies (NEBULIZER/TUBING/MOUTHPIECE) KIT, Disp one nebulizer machine, tubing set and mouthpiece kit, Disp: 1 kit, Rfl: 0 .  Spacer/Aero-Holding Chambers (OPTICHAMBER DIAMOND) MISC, USE AS DIRECTED FOR INHALATION FOR 30 DAYS, Disp: , Rfl:    Allergies  Allergen Reactions  . Dust Mite Mixed Allergen Ext [Mite (D. Farinae)]  Anaphylaxis  . Other Anaphylaxis and Other (See Comments)    DUST ROACH     Family History  Problem Relation Age of Onset  . Diabetes Father   . Hypertension Father   . Cancer Maternal Grandfather        prostate  . Heart disease Paternal Grandfather   . Migraines Mother   . Hypertension Mother   . Breast cancer Mother   . Multiple sclerosis Sister      Social History   Socioeconomic History  . Marital status: Married    Spouse name: Not on file  . Number of children: Not on file  . Years of education: Not on file  . Highest education level: Not on file  Occupational History  . Not on file  Tobacco Use  . Smoking status: Never Smoker  . Smokeless tobacco: Never Used  Substance and Sexual Activity  . Alcohol use: No    Alcohol/week: 0.0 standard drinks  . Drug use: No  . Sexual activity: Yes    Partners: Male  Other Topics Concern  . Not on file  Social History Narrative   Lives at home with husband and daughter. Pregnant with baby boy due 02/16/18.   Right handed   Drinks 2 cups of caffeine daily   Social Determinants of Health   Financial Resource Strain:   . Difficulty of Paying Living Expenses:   Food Insecurity:   . Worried About RCharity fundraiserin the Last Year:   . RArboriculturistin the Last Year:   Transportation Needs:   . LFilm/video editor(Medical):   .Marland KitchenLack of Transportation (Non-Medical):   Physical Activity:   . Days of Exercise per Week:   . Minutes of Exercise per Session:   Stress:   . Feeling of Stress :   Social Connections:   . Frequency of Communication with Friends and Family:   . Frequency of Social Gatherings with Friends and Family:   . Attends Religious Services:   . Active Member of Clubs or Organizations:   . Attends CArchivistMeetings:   .Marland KitchenMarital Status:   Intimate Partner Violence:   . Fear of Current or Ex-Partner:   . Emotionally Abused:   .Marland KitchenPhysically Abused:   . Sexually Abused:     Chart  Review Today: I personally reviewed active problem list, medication list, allergies, family history, social history, health maintenance, notes from last encounter, lab results, imaging with the patient/caregiver today.   Review of Systems 10 Systems reviewed and are negative for acute change except as noted in the HPI.     Objective:   Vitals:   01/20/20  1105  BP: 108/72  Pulse: 92  Resp: 14  Temp: 98.3 F (36.8 C)  SpO2: 98%  Weight: 200 lb (90.7 kg)  Height: 5' 3"  (1.6 m)    Body mass index is 35.43 kg/m.  Physical Exam Vitals and nursing note reviewed.  Constitutional:      General: She is not in acute distress.    Appearance: Normal appearance. She is well-developed. She is obese. She is not ill-appearing, toxic-appearing or diaphoretic.     Interventions: Face mask in place.  HENT:     Head: Normocephalic and atraumatic.     Right Ear: External ear normal.     Left Ear: External ear normal.  Eyes:     General: Lids are normal. No scleral icterus.       Right eye: No discharge.        Left eye: No discharge.     Conjunctiva/sclera: Conjunctivae normal.  Neck:     Trachea: Phonation normal. No tracheal deviation.  Cardiovascular:     Rate and Rhythm: Normal rate and regular rhythm.     Pulses: Normal pulses.          Radial pulses are 2+ on the right side and 2+ on the left side.       Posterior tibial pulses are 2+ on the right side and 2+ on the left side.     Heart sounds: Normal heart sounds. No murmur. No friction rub. No gallop.   Pulmonary:     Effort: Pulmonary effort is normal. No respiratory distress.     Breath sounds: Normal breath sounds. No stridor. No wheezing, rhonchi or rales.  Chest:     Chest wall: No tenderness.  Abdominal:     General: Bowel sounds are normal. There is no distension.     Palpations: Abdomen is soft.     Tenderness: There is no abdominal tenderness. There is no guarding or rebound.  Musculoskeletal:        General: No  deformity. Normal range of motion.     Cervical back: Normal range of motion and neck supple.     Right lower leg: No edema.     Left lower leg: No edema.  Lymphadenopathy:     Cervical: No cervical adenopathy.  Skin:    General: Skin is warm and dry.     Capillary Refill: Capillary refill takes less than 2 seconds.     Coloration: Skin is not jaundiced or pale.     Findings: No rash.  Neurological:     Mental Status: She is alert and oriented to person, place, and time.     Motor: No abnormal muscle tone.     Gait: Gait normal.  Psychiatric:        Speech: Speech normal.        Behavior: Behavior normal.      Results for orders placed or performed in visit on 09/28/19  Novel Coronavirus, NAA ([facility])   Specimen: Nasopharyngeal(NP) swabs in vial transport medium   NASOPHARYNGE  TESTING  Result Value Ref Range   SARS-CoV-2, NAA Detected (A) Not Detected        Assessment & Plan:      ICD-10-CM   1. Prediabetes  R73.03 metFORMIN (GLUCOPHAGE-XR) 500 MG 24 hr tablet    COMPLETE METABOLIC PANEL WITH GFR    Hemoglobin A1c  2. Vitamin D deficiency  E55.9 VITAMIN D 25 Hydroxy (Vit-D Deficiency, Fractures)  3. Class 2 obesity with body mass index (  BMI) of 35.0 to 35.9 in adult, unspecified obesity type, unspecified whether serious comorbidity present  E66.9 metFORMIN (GLUCOPHAGE-XR) 500 MG 24 hr tablet   G99.24 COMPLETE METABOLIC PANEL WITH GFR    Hemoglobin A1c    Lipid panel  4. Insulin resistance  E88.81 metFORMIN (GLUCOPHAGE-XR) 500 MG 24 hr tablet    COMPLETE METABOLIC PANEL WITH GFR    Hemoglobin A1c  5. Metabolic syndrome  Q68.34     Patient has obesity, prediabetes and increased insulin level with the labs that were done at a outside clinic.  Discussed how this is consistent with metabolic syndrome and there are medications which are indicated to treat insulin resistance, metabolic syndrome and obesity discussed some of those options today.  Patient is interested in  starting Metformin.  We reviewed at length healthy diet and exercise.  She drinks a lot of soda and sweet tea but has otherwise been trying to work on smaller portions and lower calorie foods throughout the day.  Encouraged her to work very diligently on cutting out calories in her drinks this will likely help significantly with her calorie intake and if she can work on increasing her physical activity hope that she will see some weight loss.  We will recheck her A1c in about 3 months.  Vitamin D deficiency was mild she was given 3 to 4 weeks of higher dose prescription vitamin D supplement, encouraged her to start a daily vitamin D supplement when she is completed with that and 3 months for now we can recheck those labs.    [name], PA-C 01/20/20 11:14 AM

## 2020-01-20 NOTE — Patient Instructions (Signed)
Vit D supplement daily after you are done with the prescription  1000 IU daily is good.  Work on diet and exercise, cut out the sodas and sweet tea gradually - calculate those calories

## 2020-01-23 ENCOUNTER — Encounter: Payer: Self-pay | Admitting: Family Medicine

## 2020-02-07 ENCOUNTER — Other Ambulatory Visit: Payer: Self-pay | Admitting: Adult Health

## 2020-02-14 ENCOUNTER — Other Ambulatory Visit: Payer: Self-pay | Admitting: Adult Health

## 2020-02-14 NOTE — Telephone Encounter (Signed)
If she is having to use medication this frequently especially she requested a refill last week, recommend scheduling follow-up visit with Dr. Lucia Gaskins or Amy, NP to discuss initiation of preventative therapy.

## 2020-03-14 ENCOUNTER — Ambulatory Visit: Payer: 59 | Admitting: Podiatry

## 2020-03-28 ENCOUNTER — Ambulatory Visit (INDEPENDENT_AMBULATORY_CARE_PROVIDER_SITE_OTHER): Payer: 59

## 2020-03-28 ENCOUNTER — Ambulatory Visit (INDEPENDENT_AMBULATORY_CARE_PROVIDER_SITE_OTHER): Payer: 59 | Admitting: Podiatry

## 2020-03-28 ENCOUNTER — Other Ambulatory Visit: Payer: Self-pay | Admitting: Podiatry

## 2020-03-28 ENCOUNTER — Encounter: Payer: Self-pay | Admitting: Podiatry

## 2020-03-28 ENCOUNTER — Other Ambulatory Visit: Payer: Self-pay

## 2020-03-28 DIAGNOSIS — M79672 Pain in left foot: Secondary | ICD-10-CM

## 2020-03-28 DIAGNOSIS — M778 Other enthesopathies, not elsewhere classified: Secondary | ICD-10-CM

## 2020-03-28 DIAGNOSIS — M79671 Pain in right foot: Secondary | ICD-10-CM | POA: Diagnosis not present

## 2020-03-28 DIAGNOSIS — L600 Ingrowing nail: Secondary | ICD-10-CM

## 2020-03-28 DIAGNOSIS — L84 Corns and callosities: Secondary | ICD-10-CM

## 2020-03-28 NOTE — Progress Notes (Signed)
Subjective:   Patient ID: Sheri Malone, female   DOB: 33 y.o.   MRN: [id number]   HPI Patient presents stating she has had calluses on the medial side of the hallux of both feet for about 20 years and they get sore and make it hard for her to wear shoe gears comfortably.  States her ingrown toenail was doing well   ROS      Objective:  Physical Exam  Neuro vascular status intact with patient is found to have keratotic lesion on the inside of the hallux bilateral with rotation of the big toes at the inner phalangeal joint with patient noted to have a well-healed ingrown toenail left and moderate ingrown toenail deformity right     Assessment:  Probable structural changes of the hallux bilaterally keratotic lesion on the side of the big toe of both feet along with ingrown toenail deformity right hallux moderate nature     Plan:  H NP x-rays reviewed conditions discussed.  At this time I trimmed the lesions and I advised this patient on continuous trimming that will be required and I went ahead and I applied padding with instructions and discussed possible surgical intervention.  I then discussed ingrown toenail do not recommend treatment currently but may be required at 1 point  X-ray indicates there is inner phalangeal joint irritation and deformity noted bilateral on x-ray

## 2020-04-13 ENCOUNTER — Encounter: Payer: Self-pay | Admitting: Family Medicine

## 2020-04-13 ENCOUNTER — Other Ambulatory Visit: Payer: Self-pay

## 2020-04-13 ENCOUNTER — Ambulatory Visit: Payer: 59 | Admitting: Family Medicine

## 2020-04-13 VITALS — BP 116/76 | HR 100 | Temp 97.5°F | Resp 16 | Ht 63.0 in | Wt 204.4 lb

## 2020-04-13 DIAGNOSIS — L508 Other urticaria: Secondary | ICD-10-CM

## 2020-04-13 DIAGNOSIS — R7303 Prediabetes: Secondary | ICD-10-CM | POA: Diagnosis not present

## 2020-04-13 DIAGNOSIS — T783XXD Angioneurotic edema, subsequent encounter: Secondary | ICD-10-CM

## 2020-04-13 DIAGNOSIS — E8881 Metabolic syndrome: Secondary | ICD-10-CM

## 2020-04-13 DIAGNOSIS — E669 Obesity, unspecified: Secondary | ICD-10-CM

## 2020-04-13 DIAGNOSIS — K219 Gastro-esophageal reflux disease without esophagitis: Secondary | ICD-10-CM

## 2020-04-13 DIAGNOSIS — Z5181 Encounter for therapeutic drug level monitoring: Secondary | ICD-10-CM

## 2020-04-13 DIAGNOSIS — E559 Vitamin D deficiency, unspecified: Secondary | ICD-10-CM

## 2020-04-13 DIAGNOSIS — Z6835 Body mass index (BMI) 35.0-35.9, adult: Secondary | ICD-10-CM

## 2020-04-13 MED ORDER — METFORMIN HCL ER 500 MG PO TB24: 1000.0000 mg | ORAL_TABLET | Freq: Two times a day (BID) | ORAL | 3 refills | Status: DC

## 2020-04-13 MED ORDER — PANTOPRAZOLE SODIUM 40 MG PO TBEC: 40.0000 mg | DELAYED_RELEASE_TABLET | Freq: Every morning | ORAL | 1 refills | Status: DC

## 2020-04-13 MED ORDER — EPINEPHRINE 0.3 MG/0.3ML IJ SOAJ: 0.3000 mg | Freq: Once | INTRAMUSCULAR | 0 refills | Status: AC

## 2020-04-13 NOTE — Patient Instructions (Signed)
Increase metformin dose very slowly - take 500 mg twice a day with food, when you tolerate that then slowly increase 1000 mg once a day and 500 mg other time of day ... etc until max dose 1000 mg by mouth with food 2x a day.  Continue to work on [employer] and exercise

## 2020-04-13 NOTE — Progress Notes (Signed)
Name: Sheri Malone   MRN: [id number]    DOB: 1986-10-04   Date:04/13/2020       Progress Note  Chief Complaint  Patient presents with  . Gastroesophageal Reflux     Subjective:   Sheri Malone is a 34 y.o. female, presents to clinic for   GERD: 15 years - Current medication regimen: protonix 40 mg daily Has been on PPI and meds for 15 years - Possible Triggers:  Triggered by everything - water, any food, severe without ppi daily - Endorses:  Bloating, reflux, epigastric burning, throat discomfort, intermittent abdominal pain, chest pain, cough- last month black stools- may have been due to a med she took. - Denies: wheezing, weight loss, dysphagia, black stools, hematemesis, history of PUD or history of GI bleeding  Migraines - managed with neurology  Severe allergies/asthma/anaphylaxis - managed by allergy specialists - she needed epi pen today and noticed it was expired, asks for refill today  Obesity: Body mass index is 36.21 kg/m. Weight Management History:  Started metformin, working on diet and exercise Is only taking metformin 500 mg once daily Wt Readings from Last 5 Encounters:  04/13/20 204 lb 6.4 oz (92.7 kg)  01/20/20 200 lb (90.7 kg)  01/18/20 198 lb (89.8 kg)  10/05/19 203 lb (92.1 kg)  09/22/18 203 lb 8 oz (92.3 kg)   BMI Readings from Last 5 Encounters:  04/13/20 36.21 kg/m  01/20/20 35.43 kg/m  01/18/20 35.07 kg/m  10/05/19 35.96 kg/m  09/22/18 36.05 kg/m   Co-Morbid Conditions: diabetes mellitus and GERD; 2 or more of these conditions combined with BMI >30 is considered morbid obesity; is this diagnosis appropriate and/or added to patient's problem list? Yes     Current Outpatient Medications:  .  albuterol (PROVENTIL HFA;VENTOLIN HFA) 108 (90 Base) MCG/ACT inhaler, Inhale 1 puff into the lungs every 6 (six) hours as needed for wheezing or shortness of breath., Disp: , Rfl:  .  azelastine (ASTELIN) 0.1 % nasal spray, INHALE 1 2 PUFFS  IN EACH NOSTRIL TWICE A DAY, Disp: , Rfl:  .  azelastine (OPTIVAR) 0.05 % ophthalmic solution, PUT 1 DROP INTO AFFECTED EYE 2 TIMES A DAY, Disp: , Rfl:  .  FLOVENT HFA 110 MCG/ACT inhaler, USE 2 PUFFS TWICE A DAY, Disp: , Rfl:  .  fluticasone (FLONASE) 50 MCG/ACT nasal spray, INHALE 1 2 SPRAYS IN EACH NOSTRIL ONCE A DAY, Disp: , Rfl:  .  ipratropium-albuterol (DUONEB) 0.5-2.5 (3) MG/3ML SOLN, Take 3 mLs by nebulization every 6 (six) hours as needed (for wheeze, coughing spells, chest tightness, SOB)., Disp: 180 mL, Rfl: 1 .  JUNEL 1/20 1-20 MG-MCG tablet, Take 1 tablet by mouth daily., Disp: , Rfl:  .  levocetirizine (XYZAL) 5 MG tablet, Take 5 mg by mouth every evening., Disp: , Rfl:  .  metFORMIN (GLUCOPHAGE-XR) 500 MG 24 hr tablet, Take 1 tablet (500 mg total) by mouth daily with breakfast., Disp: 30 tablet, Rfl: 2 .  montelukast (SINGULAIR) 10 MG tablet, Take 10 mg by mouth every evening., Disp: , Rfl:  .  Respiratory Therapy Supplies (NEBULIZER/TUBING/MOUTHPIECE) KIT, Disp one nebulizer machine, tubing set and mouthpiece kit, Disp: 1 kit, Rfl: 0 .  Spacer/Aero-Holding Chambers (OPTICHAMBER DIAMOND) MISC, USE AS DIRECTED FOR INHALATION FOR 30 DAYS, Disp: , Rfl:  .  SUMAtriptan (IMITREX) 50 MG tablet, TAKE 1 TABLET (50 MG TOTAL) BY MOUTH EVERY 2 (TWO) HOURS AS NEEDED FOR MIGRAINE. MAY REPEAT IN 2 HOURS IF HEADACHE PERSISTS OR  RECURS. MAX DOSE 200MG OVER 24 HR PERIOD, Disp: 10 tablet, Rfl: 0 .  Vitamin D, Ergocalciferol, (DRISDOL) 1.25 MG (50000 UNIT) CAPS capsule, Take 50,000 Units by mouth once a week., Disp: , Rfl:  .  EPINEPHrine (EPIPEN 2-PAK) 0.3 mg/0.3 mL IJ SOAJ injection, Inject 0.3 mLs (0.3 mg total) into the muscle once. (Patient not taking: Reported on 04/13/2020), Disp: 1 Device, Rfl: 0 .  pantoprazole (PROTONIX) 40 MG tablet, Take 40 mg by mouth daily. (Patient not taking: Reported on 04/13/2020), Disp: , Rfl:   Patient Active Problem List   Diagnosis Date Noted  . Class 2 obesity  with body mass index (BMI) of 35.0 to 35.9 in adult 01/20/2020  . Vitamin D deficiency 01/20/2020  . Prediabetes 01/20/2020  . Insulin resistance 01/20/2020  . Metabolic syndrome 27/74/1287  . Allergic rhinitis 11/23/2016  . Allergic conjunctivitis 01/17/2016  . Mild intermittent asthma 01/17/2016  . Dermatitis, eczematoid 06/21/2015  . H/O cesarean section 06/21/2015  . History of diabetes mellitus arising in pregnancy 06/21/2015  . Headache, migraine 06/21/2015    Past Surgical History:  Procedure Laterality Date  . CESAREAN SECTION N/A 05/20/2013   Procedure: CESAREAN SECTION;  Surgeon: Princess Bruins, MD;  Location: [facility];  Service: Obstetrics;  Laterality: N/A;  primary  . CESAREAN SECTION N/A 02/04/2018   Procedure: Repeat CESAREAN SECTION;  Surgeon: Aloha Gell, MD;  Location: [facility];  Service: Obstetrics;  Laterality: N/A;  EDD: 02/16/18  . CHOLECYSTECTOMY N/A 09/28/2018   Procedure: LAPAROSCOPIC CHOLECYSTECTOMY;  Surgeon: Coralie Keens, MD;  Location: [facility];  Service: General;  Laterality: N/A;  . DILATION AND CURETTAGE OF UTERUS  2009  . SALPINGOOPHORECTOMY  2009   left  . UNILATERAL SALPINGECTOMY Right 02/04/2018   Procedure: UNILATERAL SALPINGECTOMY;  Surgeon: Aloha Gell, MD;  Location: [facility];  Service: Obstetrics;  Laterality: Right;  . WISDOM TOOTH EXTRACTION      Family History  Problem Relation Age of Onset  . Diabetes Father   . Hypertension Father   . Cancer Maternal Grandfather        prostate  . Heart disease Paternal Grandfather   . Migraines Mother   . Hypertension Mother   . Breast cancer Mother   . Multiple sclerosis Sister     Social History   Tobacco Use  . Smoking status: Never Smoker  . Smokeless tobacco: Never Used  Vaping Use  . Vaping Use: Never used  Substance Use Topics  . Alcohol use: No    Alcohol/week: 0.0 standard drinks  . Drug use: No     Allergies  Allergen Reactions  . Dust Mite  Mixed Allergen Ext [Mite (D. Farinae)] Anaphylaxis  . Other Anaphylaxis and Other (See Comments)    DUST ROACH    Health Maintenance  Topic Date Due  . Hepatitis C Screening  Never done  . COVID-19 Vaccine (1) Never done  . INFLUENZA VACCINE  05/20/2020  . PAP SMEAR-Modifier  02/17/2022  . TETANUS/TDAP  01/16/2026  . HIV Screening  Completed    Chart Review Today: I personally reviewed active problem list, medication list, allergies, family history, social history, health maintenance, notes from last encounter, lab results, imaging with the patient/caregiver today.   Review of Systems  10 Systems reviewed and are negative for acute change except as noted in the HPI.     Objective:   Vitals:   04/13/20 1525  BP: 116/76  Pulse: 100  Resp: 16  Temp: (!) 97.5 F (  36.4 C)  TempSrc: Temporal  SpO2: 98%  Weight: 204 lb 6.4 oz (92.7 kg)  Height: _0  (1.6 m)    Body mass index is 36.21 kg/m.  Physical Exam Vitals and nursing note reviewed.  Constitutional:      General: She is not in acute distress.    Appearance: Normal appearance. She is well-developed. She is not ill-appearing, toxic-appearing or diaphoretic.     Interventions: Face mask in place.  HENT:     Head: Normocephalic and atraumatic.     Right Ear: External ear normal.     Left Ear: External ear normal.  Eyes:     General: Lids are normal. No scleral icterus.       Right eye: No discharge.        Left eye: No discharge.     Conjunctiva/sclera: Conjunctivae normal.  Neck:     Trachea: Phonation normal. No tracheal deviation.  Cardiovascular:     Rate and Rhythm: Normal rate and regular rhythm.     Pulses: Normal pulses.          Radial pulses are 2+ on the right side and 2+ on the left side.       Posterior tibial pulses are 2+ on the right side and 2+ on the left side.     Heart sounds: Normal heart sounds. No murmur heard.  No friction rub. No gallop.   Pulmonary:     Effort: Pulmonary  effort is normal. No respiratory distress.     Breath sounds: Normal breath sounds. No stridor. No wheezing, rhonchi or rales.  Chest:     Chest wall: No tenderness.  Abdominal:     General: Bowel sounds are normal. There is no distension.     Palpations: Abdomen is soft.     Tenderness: There is no abdominal tenderness. There is no guarding or rebound.  Musculoskeletal:        General: No deformity. Normal range of motion.     Cervical back: Normal range of motion and neck supple.     Right lower leg: No edema.     Left lower leg: No edema.  Lymphadenopathy:     Cervical: No cervical adenopathy.  Skin:    General: Skin is warm and dry.     Capillary Refill: Capillary refill takes less than 2 seconds.     Coloration: Skin is not jaundiced or pale.     Findings: No rash.  Neurological:     Mental Status: She is alert and oriented to person, place, and time.     Motor: No abnormal muscle tone.     Gait: Gait normal.  Psychiatric:        Speech: Speech normal.        Behavior: Behavior normal.         Assessment & Plan:   1. Gastroesophageal reflux disease, unspecified whether esophagitis present Severe sx for 15 years, on PPI for many years, no past EGD or GI eval for sx, refer to GI, discussed SE/risk of long term PPI use - pantoprazole (PROTONIX) 40 MG tablet; Take 1 tablet (40 mg total) by mouth in the morning.  Dispense: 90 tablet; Refill: 1 - Ambulatory referral to Gastroenterology  2. Urticaria, acute Per allergy specialists  3. Angioedema, subsequent encounter Requests refill on epipen - EPINEPHrine (EPIPEN 2-PAK) 0.3 mg/0.3 mL IJ SOAJ injection; Inject 0.3 mLs (0.3 mg total) into the muscle once for 1 dose.  Dispense: 0.3 mL; Refill: 0  4. Prediabetes Wants to recheck labs due to no improvement with metformin on weight - outside labs reviewed at last visit showed prediabetes Pt wishes to recheck today - although she was scheduled for this at an upcoming appt -  Hemoglobin A1c - metFORMIN (GLUCOPHAGE-XR) 500 MG 24 hr tablet; Take 2 tablets (1,000 mg total) by mouth 2 (two) times daily with a meal.  Dispense: 120 tablet; Refill: 3  5. Class 2 obesity with body mass index (BMI) of 35.0 to 35.9 in adult, unspecified obesity type, unspecified whether serious comorbidity present No weight loss yet, she is working on healthy diet, exercising and tolerated 500 mg metformin once daily - metFORMIN (GLUCOPHAGE-XR) 500 MG 24 hr tablet; Take 2 tablets (1,000 mg total) by mouth 2 (two) times daily with a meal.  Dispense: 120 tablet; Refill: 3  6. Insulin resistance See #5 - Hemoglobin A1c - metFORMIN (GLUCOPHAGE-XR) 500 MG 24 hr tablet; Take 2 tablets (1,000 mg total) by mouth 2 (two) times daily with a meal.  Dispense: 120 tablet; Refill: 3  7. Metabolic syndrome See #5 and 6 Slowly increase meds, keep working on diet and exercise, offered medical weight management referral?  F/up in 3 months  8. Vitamin D deficiency She completed high dose supplement, recheck deficiency - COMPLETE METABOLIC PANEL WITH GFR - VITAMIN D 25 Hydroxy (Vit-D Deficiency, Fractures)  9. Encounter for medication monitoring - CBC with Differential/Platelet - COMPLETE METABOLIC PANEL WITH GFR - Hemoglobin A1c   Return in about 3 months (around 07/14/2020) for reschedule appt for f/up 3 months, weight, A1C, chronic conditions etc.   [name], PA-C 04/13/20 3:44 PM

## 2020-04-14 LAB — COMPLETE METABOLIC PANEL WITH GFR
AG Ratio: 1.6 (calc) (ref 1.0–2.5)
ALT: 9 U/L (ref 6–29)
AST: 11 U/L (ref 10–30)
Albumin: 3.9 g/dL (ref 3.6–5.1)
Alkaline phosphatase (APISO): 26 U/L — ABNORMAL LOW (ref 31–125)
BUN: 8 mg/dL (ref 7–25)
CO2: 27 mmol/L (ref 20–32)
Calcium: 9 mg/dL (ref 8.6–10.2)
Chloride: 103 mmol/L (ref 98–110)
Creat: 0.84 mg/dL (ref 0.50–1.10)
GFR, Est African American: 104 mL/min/{1.73_m2} (ref >=60)
GFR, Est Non African American: 89 mL/min/{1.73_m2} (ref >=60)
Globulin: 2.5 g/dL (calc) (ref 1.9–3.7)
Glucose, Bld: 87 mg/dL (ref 65–99)
Potassium: 4 mmol/L (ref 3.5–5.3)
Sodium: 137 mmol/L (ref 135–146)
Total Bilirubin: 0.6 mg/dL (ref 0.2–1.2)
Total Protein: 6.4 g/dL (ref 6.1–8.1)

## 2020-04-14 LAB — CBC WITH DIFFERENTIAL/PLATELET
Absolute Monocytes: 599 cells/uL (ref 200–950)
Basophils Absolute: 50 cells/uL (ref 0–200)
Basophils Relative: 0.9 %
Eosinophils Absolute: 78 cells/uL (ref 15–500)
Eosinophils Relative: 1.4 %
HCT: 37.3 % (ref 35.0–45.0)
Hemoglobin: 11.9 g/dL (ref 11.7–15.5)
Lymphs Abs: 1960 cells/uL (ref 850–3900)
MCH: 25.3 pg — ABNORMAL LOW (ref 27.0–33.0)
MCHC: 31.9 g/dL — ABNORMAL LOW (ref 32.0–36.0)
MCV: 79.4 fL — ABNORMAL LOW (ref 80.0–100.0)
MPV: 9.3 fL (ref 7.5–12.5)
Monocytes Relative: 10.7 %
Neutro Abs: 2912 cells/uL (ref 1500–7800)
Neutrophils Relative %: 52 %
Platelets: 259 10*3/uL (ref 140–400)
RBC: 4.7 10*6/uL (ref 3.80–5.10)
RDW: 12.4 % (ref 11.0–15.0)
Total Lymphocyte: 35 %
WBC: 5.6 10*3/uL (ref 3.8–10.8)

## 2020-04-14 LAB — HEMOGLOBIN A1C
Hgb A1c MFr Bld: 5.4 % of total Hgb (ref <5.7)
Mean Plasma Glucose: 108 (calc)
eAG (mmol/L): 6 (calc)

## 2020-04-14 LAB — VITAMIN D 25 HYDROXY (VIT D DEFICIENCY, FRACTURES): Vit D, 25-Hydroxy: 77 ng/mL (ref 30–100)

## 2020-04-16 ENCOUNTER — Encounter: Payer: Self-pay | Admitting: Gastroenterology

## 2020-04-20 ENCOUNTER — Ambulatory Visit: Payer: 59 | Admitting: Family Medicine

## 2020-04-24 ENCOUNTER — Telehealth: Payer: Self-pay | Admitting: Adult Health

## 2020-04-24 NOTE — Telephone Encounter (Signed)
Pt called stating that the SUMAtriptan (IMITREX) 50 MG tablet is not working for her and she would like to know if she is going to be prescribed another medication or what the next step is. Please advise.

## 2020-04-24 NOTE — Telephone Encounter (Signed)
Spoke to pt and reminded her that she has appt next week with NP, and she can discuss medication options then, Pt states she can forgot about appt can wait to be seen.  informed pt to call office if symptoms worsen  Pt verbalized understanding

## 2020-05-02 ENCOUNTER — Ambulatory Visit: Payer: 59 | Admitting: Adult Health

## 2020-05-02 ENCOUNTER — Other Ambulatory Visit: Payer: Self-pay

## 2020-05-02 ENCOUNTER — Encounter: Payer: Self-pay | Admitting: Adult Health

## 2020-05-02 ENCOUNTER — Encounter: Payer: Self-pay | Admitting: Gastroenterology

## 2020-05-02 VITALS — BP 124/80 | HR 95 | Ht 63.0 in | Wt 200.0 lb

## 2020-05-02 DIAGNOSIS — G43109 Migraine with aura, not intractable, without status migrainosus: Secondary | ICD-10-CM

## 2020-05-02 MED ORDER — NURTEC 75 MG PO TBDP: ORAL_TABLET | ORAL | 0 refills | Status: DC

## 2020-05-02 NOTE — Progress Notes (Addendum)
[facility]    [facility] provider:  Dr Jaynee Eagles Referring Provider: [name], PA-C Primary Care Physician:  [name], PA-C    CC:  Chief Complaint  Patient presents with  . Migraine    mentral migraines. no benefit with sumatriptan      History of present illness  Today, 05/02/2020, Sheri Malone returns for migraine follow up.  She is a patient of Dr. Jaynee Eagles. Trialed sumatriptan after prior visit without benefit.  Migraines occur around her menses typically daily and at times can be debilitating.  She also reports having eyestrain type headaches in the late afternoon.  She does work on a Psychiatric nurse day.  She has been experiencing a several headaches since she had her eyes dilated back in March.  She returns today for reevaluation.   History provided for reference purposes only  Update 01/18/2020 JM: Sheri Malone is being seen today per patient request due to recent worsening of migraines.  She was previously seen in 08/2018 for follow-up regarding migraines and likely cervical strain.  At prior visits, she was pregnant and then breast-feeding therefore limited medication usage.  She would use Periactin for emergent relief with benefit.  Since prior visit, experiencing approximately 1 headache per month typically around menses.  She states on 01/04/2020, she was seen by the eye doctor and had eyes dilated with resultant severe migraine over the next 5 days.  Endorses recent severe migraine same symptoms as typical monthly migraines but more severe in intensity and lasting longer in duration.  Associated with photophobia, phonophobia and nausea and would typically worsen throughout the day with complete debilitation in the evening.  Typically unilateral pounding/pulsating sensation.  She also endorses blurred vision at the onset of migraines but resolves after a few seconds hence the reason for seeing eye doctor.  Migraines typically alleviated by Periactin and Excedrin but recent  episode medication not beneficial.  Worsened by position changes, loud noise and light.  Denies any medication changes or medical history changes.  She received dry needling for likely cervical strain with great improvement.  No further concerns at this time.  Interval history 08/24/2018 JM: Patient is being seen today due to concerns of continuous bilateral neck pain that radiates down into bilateral shoulders with intermittent numbness sensation down left arm.  She does state that she experiences a slight headache that radiates up into her occipital region but denies any further location.  She does endorse a muscle tightness greater on her left side located near her left shoulder and left side of neck.  Denies any type of traumatic event or injury that could have occurred.  She does not believe that it is related to sleeping habits or sleeping in a bad position.  She was seen at urgent care on 06/18/2018 where was recommended to take ibuprofen and Flexeril as it is felt to be muscle related.  She is unable to take Flexeril during the day due to increased fatigue and would take 5 mg tablet at night but did not gain benefit from this.  She does take the ibuprofen on occasion which will take the edge off but it does not last long.  She is continuing to breast-feed at this time therefore she tries to limit amount of medication use.  She has attempted to use heat with only minimal relief.  Per review of notes, patient has had this ongoing complaint and underwent imaging on 04/01/2017 which was negative. She does have a history of migraines for  which she was taking Periactin during pregnancy but has since stopped due to continued breast-feeding and in the very beginning, there was one occurrence where she did make the baby drowsy.  She does endorse migraines approximately 1-2 times per month located behind her eye which worsens with fatigue or lack of calories and associated with photophobia, phonophobia, nausea and  lightheadedness.  She has not noticed any worsening of these migraines since stopping the Periactin.  Interval history 12/15/2017 Dr. Jaynee Eagles:  Her migraines have improved. She is in her second trimester. More related to sleep and fatigue due to pregnancy. She had a headache the other day. Discussed migraines can improve in pregnancy. The Periactin helps. Taking it when she has headaches helps if she takes it in the start of headache. Helps. Discussed migraines and menopause. Mother had migraines, improved after menopause.    Initial consult visit 09/15/2017 Dr. Jaynee Eagles: Sheri Malone is a 34 y.o. female here as a referral from Dr. Lucio Edward for headaches. Patient is approx [redacted] weeks pregnant.  She has had right arm and neck pain, had normal c-spine xrays, She has a history of migraines starting in adolescence. Birth control helped. She has used Fioricet in the past. Headaches are worsening. A few months ago she had a shoulder injury and PT has helped. These headaches are different, not eating triggers it, not sleeping well triggers headaches. Mother with migraines. It can start anywhere in the head, behind the eyes, can be unilateral, she has light/sound sensitivity, being in the dark helps, positional laying down makes it worse, movement makes it worse, sitting straight in a dark place is the best. Worsening, weekly. Could last 2 days. She has headaches 20 days a month and over half are migrainous. She has associated nausea. She has vision changes with the migraines. No hearing changes, no pulsating in the ears.  Reviewed notes, labs and imaging from outside physicians, which showed: Personally reviewed images and agree with the following: There is no evidence of cervical spine fracture or prevertebral soft tissue swelling. Alignment is normal. No other significant bone abnormalities are identified. IMPRESSION: Negative cervical spine radiographs. CBC with anemia   Review of Systems: Patient complains of  symptoms per HPI as well as the following symptoms: Headaches; pertinent negatives and positives per HPI. All others negative.   Social History   Socioeconomic History  . Marital status: Married    Spouse name: Not on file  . Number of children: Not on file  . Years of education: Not on file  . Highest education level: Not on file  Occupational History  . Not on file  Tobacco Use  . Smoking status: Never Smoker  . Smokeless tobacco: Never Used  Vaping Use  . Vaping Use: Never used  Substance and Sexual Activity  . Alcohol use: No    Alcohol/week: 0.0 standard drinks  . Drug use: No  . Sexual activity: Yes    Partners: Male  Other Topics Concern  . Not on file  Social History Narrative   Lives at home with husband and daughter. Pregnant with baby boy due 02/16/18.   Right handed   Drinks 2 cups of caffeine daily   Social Determinants of Health   Financial Resource Strain:   . Difficulty of Paying Living Expenses:   Food Insecurity:   . Worried About Charity fundraiser in the Last Year:   . Arboriculturist in the Last Year:   Transportation Needs:   .  Lack of Transportation (Medical):   Marland Kitchen Lack of Transportation (Non-Medical):   Physical Activity:   . Days of Exercise per Week:   . Minutes of Exercise per Session:   Stress:   . Feeling of Stress :   Social Connections:   . Frequency of Communication with Friends and Family:   . Frequency of Social Gatherings with Friends and Family:   . Attends Religious Services:   . Active Member of Clubs or Organizations:   . Attends Banker Meetings:   Marland Kitchen Marital Status:   Intimate Partner Violence:   . Fear of Current or Ex-Partner:   . Emotionally Abused:   Marland Kitchen Physically Abused:   . Sexually Abused:     Family History  Problem Relation Age of Onset  . Diabetes Father   . Hypertension Father   . Cancer Maternal Grandfather        prostate  . Heart disease Paternal Grandfather   . Migraines Mother   .  Hypertension Mother   . Breast cancer Mother   . Multiple sclerosis Sister     Past Medical History:  Diagnosis Date  . Abnormal Pap smear   . Asthma   . GERD (gastroesophageal reflux disease)   . Gestational HTN   . Headache(784.0)   . History of ectopic pregnancy   . History of gestational diabetes   . Low hemoglobin    on ironspan  . Rhinitis   . Vaginal Pap smear, abnormal     Past Surgical History:  Procedure Laterality Date  . CESAREAN SECTION N/A 05/20/2013   Procedure: CESAREAN SECTION;  Surgeon: Genia Del, MD;  Location: [facility] ORS;  Service: Obstetrics;  Laterality: N/A;  primary  . CESAREAN SECTION N/A 02/04/2018   Procedure: Repeat CESAREAN SECTION;  Surgeon: Noland Fordyce, MD;  Location: [facility] BIRTHING SUITES;  Service: Obstetrics;  Laterality: N/A;  EDD: 02/16/18  . CHOLECYSTECTOMY N/A 09/28/2018   Procedure: LAPAROSCOPIC CHOLECYSTECTOMY;  Surgeon: Abigail Miyamoto, MD;  Location: [facility] OR;  Service: General;  Laterality: N/A;  . DILATION AND CURETTAGE OF UTERUS  2009  . SALPINGOOPHORECTOMY  2009   left  . UNILATERAL SALPINGECTOMY Right 02/04/2018   Procedure: UNILATERAL SALPINGECTOMY;  Surgeon: Noland Fordyce, MD;  Location: [facility] BIRTHING SUITES;  Service: Obstetrics;  Laterality: Right;  . WISDOM TOOTH EXTRACTION      Current Outpatient Medications  Medication Sig Dispense Refill  . albuterol (PROVENTIL HFA;VENTOLIN HFA) 108 (90 Base) MCG/ACT inhaler Inhale 1 puff into the lungs every 6 (six) hours as needed for wheezing or shortness of breath.    Marland Kitchen azelastine (ASTELIN) 0.1 % nasal spray INHALE 1 2 PUFFS IN EACH NOSTRIL TWICE A DAY    . azelastine (OPTIVAR) 0.05 % ophthalmic solution PUT 1 DROP INTO AFFECTED EYE 2 TIMES A DAY    . FLOVENT HFA 110 MCG/ACT inhaler USE 2 PUFFS TWICE A DAY    . fluticasone (FLONASE) 50 MCG/ACT nasal spray INHALE 1 2 SPRAYS IN EACH NOSTRIL ONCE A DAY    . ipratropium-albuterol (DUONEB) 0.5-2.5 (3) MG/3ML SOLN Take 3 mLs by  nebulization every 6 (six) hours as needed (for wheeze, coughing spells, chest tightness, SOB). 180 mL 1  . JUNEL 1/20 1-20 MG-MCG tablet Take 1 tablet by mouth daily.    Marland Kitchen levocetirizine (XYZAL) 5 MG tablet Take 5 mg by mouth every evening.    . metFORMIN (GLUCOPHAGE-XR) 500 MG 24 hr tablet Take 2 tablets (1,000 mg total) by mouth 2 (two) times daily  with a meal. 120 tablet 3  . montelukast (SINGULAIR) 10 MG tablet Take 10 mg by mouth every evening.    . pantoprazole (PROTONIX) 40 MG tablet Take 1 tablet (40 mg total) by mouth in the morning. 90 tablet 1  . Respiratory Therapy Supplies (NEBULIZER/TUBING/MOUTHPIECE) KIT Disp one nebulizer machine, tubing set and mouthpiece kit 1 kit 0  . Spacer/Aero-Holding Chambers (OPTICHAMBER DIAMOND) MISC USE AS DIRECTED FOR INHALATION FOR 30 DAYS    . SUMAtriptan (IMITREX) 50 MG tablet TAKE 1 TABLET (50 MG TOTAL) BY MOUTH EVERY 2 (TWO) HOURS AS NEEDED FOR MIGRAINE. MAY REPEAT IN 2 HOURS IF HEADACHE PERSISTS OR RECURS. MAX DOSE 200MG OVER 24 HR PERIOD 10 tablet 0  . Vitamin D, Ergocalciferol, (DRISDOL) 1.25 MG (50000 UNIT) CAPS capsule Take 50,000 Units by mouth once a week.     No current facility-administered medications for this visit.    Allergies as of 05/02/2020 - Review Complete 05/02/2020  Allergen Reaction Noted  . 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 Other Anaphylaxis and Other (See Comments) 12/15/2017    Vitals: BP 124/80   Pulse 95   Ht _0  (1.6 m)   Wt 200 lb (90.7 kg)   BMI 35.43 kg/m  Last Weight:  Wt Readings from Last 1 Encounters:  05/02/20 200 lb (90.7 kg)   Last Height:   Ht Readings from Last 1 Encounters:  05/02/20 _1  (1.6 m)   Physical exam:  General: well developed, well nourished,  pleasant middle-aged African-American female, seated, in no evident distress Head: head normocephalic and atraumatic.   Neck: supple with no carotid or supraclavicular bruits Cardiovascular:  regular rate and rhythm, no murmurs Musculoskeletal: no deformity Skin:  no rash/petichiae Vascular:  Normal pulses all extremities   Neurologic Exam Mental Status: Awake and fully alert.   Normal speech and language.  Oriented to place and time. Recent and remote memory intact. Attention span, concentration and fund of knowledge appropriate. Mood and affect appropriate.  Cranial Nerves: Pupils equal, briskly reactive to light. Extraocular movements full without nystagmus. Visual fields full to confrontation. Hearing intact. Facial sensation intact. Face, tongue, palate moves normally and symmetrically.  Motor: Normal bulk and tone. Normal strength in all tested extremity muscles. Sensory.: intact to touch , pinprick , position and vibratory sensation.  Coordination: Rapid alternating movements normal in all extremities. Finger-to-nose and heel-to-shin performed accurately bilaterally. Gait and Station: Arises from chair without difficulty. Stance is normal. Gait demonstrates normal stride length and balance Reflexes: 1+ and symmetric. Toes downgoing.        Assessment/Plan:  Sheri Malone is a 34 year old female followed in this office by Dr. Jaynee Eagles for menstrual migraine management.   Trial Nurtec 75 mg daily during 1 day prior to start of menses, daily throughout menses and 1 day after completion of menses Samples provided and will call office if beneficial and order will be placed Advised to call office if no benefit  Discussion regarding eyestrain type headache and use of bluelight glasses, screen protector, frequent breaks, and increasing screen refresh time.  Advised her to follow-up with ophthalmologist if symptoms persist  Failed: Sumatriptan, Fioricet  Follow-up in 6 months or call earlier if needed  I spent 23 minutes of face-to-face and non-face-to-face time with patient.  This included previsit chart review, lab review, study review, order entry, electronic health record  documentation, patient education regarding menstrual migraine, treatment options and answered all questions to patient satisfaction  Frann Rider, AGNP-BC  [facility] [address] [place], [place] [address]  Phone [phone] Fax [phone] Note: This document was prepared with digital dictation and possible smart phrase technology. Any transcriptional errors that result from this process are unintentional.   Made any corrections needed, and agree with history, physical, neuro exam,assessment and plan as stated.     Sarina Ill, MD [facility]

## 2020-05-19 ENCOUNTER — Emergency Department (HOSPITAL_COMMUNITY): Payer: 59

## 2020-05-19 ENCOUNTER — Encounter (HOSPITAL_COMMUNITY): Payer: Self-pay | Admitting: *Deleted

## 2020-05-19 ENCOUNTER — Other Ambulatory Visit: Payer: Self-pay

## 2020-05-19 ENCOUNTER — Emergency Department (HOSPITAL_COMMUNITY)
Admission: EM | Admit: 2020-05-19 | Discharge: 2020-05-20 | Disposition: A | Discharge status: Home / Self Care | Payer: 59 | Attending: Emergency Medicine | Admitting: Emergency Medicine

## 2020-05-19 DIAGNOSIS — S82851A Displaced trimalleolar fracture of right lower leg, initial encounter for closed fracture: Secondary | ICD-10-CM | POA: Diagnosis not present

## 2020-05-19 DIAGNOSIS — Y929 Unspecified place or not applicable: Secondary | ICD-10-CM | POA: Insufficient documentation

## 2020-05-19 DIAGNOSIS — Y9351 Activity, roller skating (inline) and skateboarding: Secondary | ICD-10-CM | POA: Diagnosis not present

## 2020-05-19 DIAGNOSIS — W010XXA Fall on same level from slipping, tripping and stumbling without subsequent striking against object, initial encounter: Secondary | ICD-10-CM | POA: Diagnosis not present

## 2020-05-19 DIAGNOSIS — R52 Pain, unspecified: Secondary | ICD-10-CM

## 2020-05-19 DIAGNOSIS — S99911A Unspecified injury of right ankle, initial encounter: Secondary | ICD-10-CM | POA: Diagnosis present

## 2020-05-19 DIAGNOSIS — Y999 Unspecified external cause status: Secondary | ICD-10-CM | POA: Diagnosis not present

## 2020-05-19 MED ORDER — OXYCODONE HCL 5 MG PO TABS: 5.0000 mg | ORAL_TABLET | Freq: Four times a day (QID) | ORAL | 0 refills | Status: AC | PRN

## 2020-05-19 MED ORDER — FENTANYL CITRATE (PF) 100 MCG/2ML IJ SOLN
100.0000 ug | Freq: Once | INTRAMUSCULAR | Status: AC
  Administered 2020-05-19: 100 ug via INTRAVENOUS
  Filled 2020-05-19: qty 2

## 2020-05-19 MED ORDER — PROPOFOL 10 MG/ML IV BOLUS: 0.5000 mg/kg | Freq: Once | INTRAVENOUS | Status: DC

## 2020-05-19 MED ORDER — PROPOFOL 10 MG/ML IV BOLUS
INTRAVENOUS | Status: AC | PRN
  Administered 2020-05-19: 25 mg via INTRAVENOUS
  Administered 2020-05-19: 50 mg via INTRAVENOUS
  Administered 2020-05-19 (×2): 25 mg via INTRAVENOUS

## 2020-05-19 MED ORDER — KETAMINE HCL 50 MG/5ML IJ SOSY
0.5000 mg/kg | PREFILLED_SYRINGE | Freq: Once | INTRAMUSCULAR | Status: DC
  Filled 2020-05-19: qty 5

## 2020-05-19 MED ORDER — PROPOFOL 10 MG/ML IV BOLUS
0.5000 mg/kg | Freq: Once | INTRAVENOUS | Status: DC
  Filled 2020-05-19: qty 20

## 2020-05-19 MED ORDER — FENTANYL CITRATE (PF) 100 MCG/2ML IJ SOLN
INTRAMUSCULAR | Status: AC
  Filled 2020-05-19: qty 2

## 2020-05-19 MED ORDER — FENTANYL CITRATE (PF) 100 MCG/2ML IJ SOLN
INTRAMUSCULAR | Status: AC | PRN
  Administered 2020-05-19: 50 ug via INTRAVENOUS

## 2020-05-19 NOTE — ED Triage Notes (Signed)
The pt arrived by ems from a roller skating party  Her daughter fell in front of hercausing the pt to fall  Sl deformity  Good pedal pulse  Alert oriented skin warm and dry  Iv per gems    Fentanyl given on the way here

## 2020-05-19 NOTE — ED Provider Notes (Signed)
.Sedation  Date/Time: 05/19/2020 9:43 PM Performed by: Virgina Norfolk, DO Authorized by: Virgina Norfolk, DO   Consent:    Consent obtained:  Verbal   Consent given by:  Patient   Risks discussed:  Allergic reaction, dysrhythmia, inadequate sedation, nausea, prolonged hypoxia resulting in organ damage, prolonged sedation necessitating reversal, respiratory compromise necessitating ventilatory assistance and intubation and vomiting   Alternatives discussed:  Analgesia without sedation, anxiolysis and regional anesthesia Universal protocol:    Procedure explained and questions answered to patient or proxy's satisfaction: yes     Relevant documents present and verified: yes     Test results available and properly labeled: yes     Imaging studies available: yes     Required blood products, implants, devices, and special equipment available: yes     Site/side marked: yes     Immediately prior to procedure a time out was called: yes     Patient identity confirmation method:  Verbally with patient Indications:    Procedure necessitating sedation performed by:  Physician performing sedation Pre-sedation assessment:    Time since last food or drink:  Hours   ASA classification: class 1 - normal, healthy patient     Neck mobility: normal     Mouth opening:  3 or more finger widths   Thyromental distance:  4 finger widths   Mallampati score:  I - soft palate, uvula, fauces, pillars visible   Pre-sedation assessments completed and reviewed: airway patency, cardiovascular function, hydration status, mental status, nausea/vomiting, pain level, respiratory function and temperature     Pre-sedation assessment completed:  05/19/2020 9:43 PM Immediate pre-procedure details:    Reassessment: Patient reassessed immediately prior to procedure     Reviewed: vital signs, relevant labs/tests and NPO status     Verified: bag valve mask available, emergency equipment available, intubation equipment available,  IV patency confirmed, oxygen available and suction available   Procedure details (see MAR for exact dosages):    Preoxygenation:  Nasal cannula   Sedation:  Propofol   Intended level of sedation: deep   Intra-procedure monitoring:  Blood pressure monitoring, cardiac monitor, continuous pulse oximetry, frequent LOC assessments, frequent vital sign checks and continuous capnometry   Intra-procedure events: none     Total Provider sedation time (minutes):  25 Post-procedure details:    Post-sedation assessment completed:  05/19/2020 11:06 PM   Attendance: Constant attendance by certified staff until patient recovered     Recovery: Patient returned to pre-procedure baseline     Post-sedation assessments completed and reviewed: airway patency, cardiovascular function, hydration status, mental status, nausea/vomiting, pain level, respiratory function and temperature     Patient is stable for discharge or admission: yes     Patient tolerance:  Tolerated well, no immediate complications   Medical screening examination/treatment/procedure(s) were conducted as a shared visit with non-physician practitioner(s) and myself.  I personally evaluated the patient during the encounter. Briefly, the patient is a 34 y.o. female rolled her right ankle while rollerblading.  Has trimalleolar fracture.  Patient neurovascularly neuromuscularly intact.  Patient was sedated with propofol and reduction was performed by my physician associate with improved alignment.  Dr. Linna Caprice with orthopedics recommends CT scan and he will follow-up outpatient.  Patient tolerated sedation well as well as reduction.  This chart was dictated using voice recognition software.  Despite best efforts to proofread,  errors can occur which can change the documentation meaning.     EKG Interpretation None  Virgina Norfolk, DO 05/19/20 [id number]

## 2020-05-19 NOTE — Discharge Instructions (Addendum)
You have a trimalleolar fracture of your ankle.  This is a fracture in which there are 3 fractures in/around the ankle joint.  We performed conscious sedation and reduction of the fracture here and obtained better alignment.  Please keep the splint dry.  Use crutches to ambulate and do not place any weight on your foot.  Elevate your ankle at all times.  Alternate ibuprofen and acetaminophen for mild to moderate pain.  For pain and inflammation you can use a combination of ibuprofen and acetaminophen.  Take [phone] mg acetaminophen (tylenol) every 6 hours or 600 mg ibuprofen (advil, motrin) every 6 hours.  You can take these separately or combine them every 6 hours for maximum pain control. Do not exceed 4,000 mg acetaminophen or 2,400 mg ibuprofen in a 24 hour period.  Do not take ibuprofen containing products if you have history of kidney disease, ulcers, GI bleeding, severe acid reflux, or take a blood thinner.  Do not take acetaminophen if you have liver disease.   For break through and/or severe pain despite ibuprofen and acetaminophen regimen, take 5 mg oxycodone every 4 hours.  Oxycodone is a narcotic pain medication that has risk of overdose, death, dependence and abuse. Mild and expected side effects include nausea, stomach upset, drowsiness, constipation. Do not consume alcohol, drive or use heavy machinery while taking this medication. Do not leave unattended around children. Flush any remaining pills that you do not use and do not share.  The emergency department has a strict policy regarding prescription of narcotic medications. We prescribe a short course for acute, new pain or injuries. We are unable to refill this medication in the emergency department for chronic pain or repeatedly.  Refill need to be done by specialist or primary care provider or pain clinic.  Contact your primary care provider or specialist for chronic pain management and refill on narcotic medications.   Please  call Dr. Laverta Baltimore office on Monday to arrange follow-up  Return to the ED immediately if you have sudden onset severe pain in the ankle or underneath the splint that does not improve with medicines, discoloration or coolness to your toes or foot, decreased sensation to the toes or foot

## 2020-05-19 NOTE — ED Provider Notes (Signed)
  Face-to-face evaluation   History: Injured while rollerskating, isolated right ankle.  Isolated injury.  No other complaint.  She last ate about 8 hours ago.  Physical exam: Alert, calm cooperative.  Right ankle tender and swollen laterally.  Neurovascular intact distally in the toes of the right foot.  Clinical Course as of May 19 2024  Sat May 19, 2020  2004 Case discussed with orthopedics, Dr. Linna Caprice.  He request reduction, postreduction x-ray and post reduction CT.  Splinting with stirrups, recommended.   [EW]    Clinical Course User Index [EW] Mancel Bale, MD    Procedures  Medical screening examination/treatment/procedure(s) were conducted as a shared visit with non-physician practitioner(s) and myself.  I personally evaluated the patient during the encounter    Mancel Bale, MD 05/20/20 [phone]

## 2020-05-19 NOTE — ED Provider Notes (Signed)
[facility] EMERGENCY DEPARTMENT Provider Note   CSN: [id number] Arrival date & time: 05/19/20  1847     History Chief Complaint  Patient presents with  . Ankle Pain    Sheri Malone is a 34 y.o. female presents to the ED by EMS for evaluation of right ankle injury.  Patient states she was on a rollerskating party.  Her daughter fell in front of her causing patient to fall.  Had sudden onset right ankle pain with deformity.  Unable to put weight on it.  Reports previously having a 30 degree sprain in the ankle many years ago.  Denies any other physical injury from the fall.  Specifically denies any headache, neck pain.  No back pain.  No other physical injuries reported.  No anticoagulants.  Last p.o. intake 1130 this morning.  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  Received fentanyl in route by EMS.  Denies distal foot/toe tingling, loss of sensation.   HPI     History reviewed. No pertinent past medical history.  There are no problems to display for this patient.   History reviewed. No pertinent surgical history.   OB History   No obstetric history on file.     No family history on file.  Social History   Tobacco Use  . Smoking status: Never Smoker  . Smokeless tobacco: Never Used  Substance Use Topics  . Alcohol use: Never  . Drug use: Not on file    Home Medications Prior to Admission medications   Medication Sig Start Date End Date Taking? Authorizing Provider  oxyCODONE (OXY IR/ROXICODONE) 5 MG immediate release tablet Take 1 tablet (5 mg total) by mouth every 6 (six) hours as needed for up to 3 days for severe pain. 05/19/20 05/22/20  Liberty Handy, PA-C    Allergies    Patient has no allergy information on record.  Review of Systems   Review of Systems  Musculoskeletal: Positive for arthralgias, gait problem and joint swelling.  All other systems reviewed and are negative.   Physical Exam Updated Vital Signs BP (!) 129/92    Pulse (!) 109   Temp 99 F (37.2 C) (Oral)   Resp (!) 25   Ht 5\' 3"  (1.6 m)   Wt 90.7 kg   SpO2 100%   BMI 35.43 kg/m   Physical Exam Constitutional:      Appearance: She is well-developed.  HENT:     Head: Normocephalic.     Nose: Nose normal.  Eyes:     General: Lids are normal.  Cardiovascular:     Rate and Rhythm: Normal rate.     Comments: 1+ PT and DP pulses in RLE. Toes warm with good cap refill  Pulmonary:     Effort: Pulmonary effort is normal. No respiratory distress.  Musculoskeletal:        General: Tenderness and deformity present. Normal range of motion.     Cervical back: Normal range of motion.     Comments: No focal knee tenderness, signs of injury. Right knee ROM deferred due to obvious right ankle deformity.   Diffuse right ankle edema and lateral positioning of foot.  No metatarsal or MTP or toe tenderness. Patient able to wiggle toes.  Calf soft, non tender.  Achilles non tender.  ROM of ankle deferred.   Neurological:     Mental Status: She is alert.     Comments: Sensation in RLE/foot intact to light touch. Good great toe  flexion/extension against resistance   Psychiatric:        Behavior: Behavior normal.     ED Results / Procedures / Treatments   Labs (all labs ordered are listed, but only abnormal results are displayed) Labs Reviewed - No data to display  EKG None  Radiology DG Ankle 2 Views Right  Result Date: 05/19/2020 CLINICAL DATA:  Fracture, postreduction. EXAM: RIGHT ANKLE - 2 VIEW COMPARISON:  Pre reduction radiographs earlier this day. FINDINGS: Slightly improved alignment of the trimalleolar fracture postreduction. There is mild residual displacement and persistent widening of the medial clear space. Overlying splint material in place. IMPRESSION: Slightly improved alignment of trimalleolar fracture postreduction with overlying splint material in place. Electronically Signed   By: Narda Rutherford M.D.   On: 05/19/2020 22:42   DG  Ankle 2 Views Right  Result Date: 05/19/2020 CLINICAL DATA:  Status post reduction EXAM: RIGHT ANKLE - 2 VIEW COMPARISON:  Film from earlier in the same day. FINDINGS: Trimalleolar fracture is again noted. Fracture fragments have been reduced. The talus is appropriately placed. Significant soft tissue swelling is noted. IMPRESSION: Reduction of previously seen trimalleolar fracture. Electronically Signed   By: Alcide Clever M.D.   On: 05/19/2020 22:40   DG Ankle Complete Right  Result Date: 05/19/2020 CLINICAL DATA:  Fall while in line skating EXAM: RIGHT ANKLE - COMPLETE 3+ VIEW COMPARISON:  Radiograph 03/28/2020 FINDINGS: Obliquely oriented trans syndesmotic fracture of the distal fibula. Transversely oriented, inferiorly displaced fracture of the medial malleolus with mild comminution. Mildly displaced posterior malleolar fracture as well. Significant lateral talar shift is noted. Diffuse circumferential soft tissue swelling of the ankle with a moderate ankle joint effusion. Mild bony spurring at the dorsal aspect of navicular. Posterior calcaneal spur noted. Midfoot and hindfoot alignment is grossly preserved though incompletely assessed on nondedicated radiographs. IMPRESSION: 1. Trimalleolar fracture with significant lateral talar shift in a pattern compatible with an unstable Weber B stage IV supination external rotation injury. 2. Diffuse soft tissue swelling and moderate ankle joint effusion. Electronically Signed   By: Kreg Shropshire M.D.   On: 05/19/2020 19:55    Procedures Reduction of fracture  Date/Time: 05/19/2020 11:22 PM Performed by: Liberty Handy, PA-C Authorized by: Liberty Handy, PA-C  Local anesthesia used: no  Anesthesia: Local anesthesia used: no  Sedation: Patient sedated: yes (by EDP Curatolo) Sedatives: propofol Analgesia: fentanyl  Patient tolerance: patient tolerated the procedure well with no immediate complications Comments: Reduction performed under  direct supervision and assistance of EDP Curatolo. Patient had 1+ DP and PT pulses in RLE post splinting/reduction. Unchanged neurovascular exam post reduction/splinting.    (including critical care time)  Medications Ordered in ED Medications  propofol (DIPRIVAN) 10 mg/mL bolus/IV push 45.4 mg (has no administration in time range)  fentaNYL (SUBLIMAZE) 100 MCG/2ML injection (has no administration in time range)  fentaNYL (SUBLIMAZE) injection 100 mcg (100 mcg Intravenous Given 05/19/20 2009)  propofol (DIPRIVAN) 10 mg/mL bolus/IV push (25 mg Intravenous Given 05/19/20 2217)  fentaNYL (SUBLIMAZE) injection (50 mcg Intravenous Given 05/19/20 2231)    ED Course  I have reviewed the triage vital signs and the nursing notes.  Pertinent labs & imaging results that were available during my care of the patient were reviewed by me and considered in my medical decision making (see chart for details).  Clinical Course as of May 19 2333  Sat May 19, 2020  2004 Case discussed with orthopedics, Dr. Linna Caprice.  He request reduction, postreduction x-ray and  post reduction CT.  Splinting with stirrups, recommended.   [EW]    Clinical Course User Index [EW] Mancel Bale, MD   MDM Rules/Calculators/A&P                          34 year old female brought to the ED by EMS after fall with right ankle deformity.  Exam reveals normal neurovascular status with obvious right ankle deformity.  Compartments soft.  Ordered fentanyl, right ankle x-rays.  X-rays personally visualized and reviewed with EDP Curatolo.  Previous EDP Wentz discussed x-rays with Dr Linna Caprice at shift change.  Called Dr. Linna Caprice again to confirm POC.  Dr Linna Caprice has reviewed patient's x-rays.  He recommends reduction, postreduction x-rays and page back to discuss post reduction films.  2325: Conscious sedation and reduction performed in the ED under EDP Curatolo supervision.  No immediate complications.  Unchanged neurovascular  status post reduction and splinting.  Patient has improvement in pain.  Call Dr. Linna Caprice back who reviewed patient's post reduction x-rays, overall improved positioning.  Recommend CT of the ankle and patient discharged.  Patient to be NWB, crutches, follow-up with Dr. Victorino Dike in the office.  We will discharge patient with oxycodone, elevation.  Splint instructions given to patient by [facility].  Return precautions given.  Final Clinical Impression(s) / ED Diagnoses Final diagnoses:  Closed trimalleolar fracture of right ankle, initial encounter    Rx / DC Orders ED Discharge Orders         Ordered    oxyCODONE (OXY IR/ROXICODONE) 5 MG immediate release tablet  Every 6 hours PRN     Discontinue  Reprint     05/19/20 2333           Liberty Handy, PA-C 05/19/20 2334    Virgina Norfolk, DO 05/20/20 1459

## 2020-05-21 ENCOUNTER — Encounter (HOSPITAL_COMMUNITY): Payer: Self-pay | Admitting: *Deleted

## 2020-05-21 ENCOUNTER — Encounter: Payer: Self-pay | Admitting: Family Medicine

## 2020-05-21 DIAGNOSIS — S82851A Displaced trimalleolar fracture of right lower leg, initial encounter for closed fracture: Secondary | ICD-10-CM | POA: Insufficient documentation

## 2020-06-06 ENCOUNTER — Ambulatory Visit: Payer: 59 | Admitting: Gastroenterology

## 2020-06-07 ENCOUNTER — Ambulatory Visit: Payer: Self-pay | Admitting: Gastroenterology

## 2020-07-13 NOTE — Progress Notes (Signed)
Name: Sheri Malone   MRN: [id number]    DOB: 05-Mar-1986   Date:07/16/2020       Progress Note  Chief Complaint  Patient presents with  . Follow-up    check A1c  . Weight Check     Subjective:   Sheri Malone is a 34 y.o. female, presents to clinic for med monitoring   On metformin for obesity, insulin resistance, wants A1C rechecked - she has held metformin since she had a right ankle fracture had surgery and she held the metformin and it was causing SE diarrhea  Lab Results  Component Value Date   HGBA1C 5.4 04/13/2020   Obesity: Weight is down 5 more lbs Wt Readings from Last 5 Encounters:  07/16/20 195 lb (88.5 kg)  05/19/20 200 lb (90.7 kg)  05/02/20 200 lb (90.7 kg)  04/13/20 204 lb 6.4 oz (92.7 kg)  01/20/20 200 lb (90.7 kg)   BMI Readings from Last 5 Encounters:  07/16/20 34.54 kg/m  05/19/20 35.43 kg/m  05/02/20 35.43 kg/m  04/13/20 36.21 kg/m  01/20/20 35.43 kg/m  When she was on metformin she had less cravings for soda and sweet tea  GERD:  Still having sx off and on Using protonix every day       Current Outpatient Medications:  .  albuterol (PROVENTIL HFA;VENTOLIN HFA) 108 (90 Base) MCG/ACT inhaler, Inhale 1 puff into the lungs every 6 (six) hours as needed for wheezing or shortness of breath., Disp: , Rfl:  .  azelastine (ASTELIN) 0.1 % nasal spray, INHALE 1 2 PUFFS IN EACH NOSTRIL TWICE A DAY, Disp: , Rfl:  .  azelastine (OPTIVAR) 0.05 % ophthalmic solution, PUT 1 DROP INTO AFFECTED EYE 2 TIMES A DAY, Disp: , Rfl:  .  FLOVENT HFA 110 MCG/ACT inhaler, USE 2 PUFFS TWICE A DAY, Disp: , Rfl:  .  fluticasone (FLONASE) 50 MCG/ACT nasal spray, INHALE 1 2 SPRAYS IN EACH NOSTRIL ONCE A DAY, Disp: , Rfl:  .  ipratropium-albuterol (DUONEB) 0.5-2.5 (3) MG/3ML SOLN, Take 3 mLs by nebulization every 6 (six) hours as needed (for wheeze, coughing spells, chest tightness, SOB)., Disp: 180 mL, Rfl: 1 .  JUNEL 1/20 1-20 MG-MCG tablet, Take 1 tablet by mouth  daily., Disp: , Rfl:  .  levocetirizine (XYZAL) 5 MG tablet, Take 5 mg by mouth every evening., Disp: , Rfl:  .  metFORMIN (GLUCOPHAGE-XR) 500 MG 24 hr tablet, Take 2 tablets (1,000 mg total) by mouth 2 (two) times daily with a meal., Disp: 120 tablet, Rfl: 3 .  montelukast (SINGULAIR) 10 MG tablet, Take 10 mg by mouth every evening., Disp: , Rfl:  .  pantoprazole (PROTONIX) 40 MG tablet, Take 1 tablet (40 mg total) by mouth in the morning., Disp: 90 tablet, Rfl: 1 .  Respiratory Therapy Supplies (NEBULIZER/TUBING/MOUTHPIECE) KIT, Disp one nebulizer machine, tubing set and mouthpiece kit, Disp: 1 kit, Rfl: 0 .  Rimegepant Sulfate (NURTEC) 75 MG TBDP, 1 tab 1 day prior to start of menses, daily during menses, and 1 tab after completion of menses, Disp: 8 tablet, Rfl: 0 .  Spacer/Aero-Holding Chambers (OPTICHAMBER DIAMOND) MISC, USE AS DIRECTED FOR INHALATION FOR 30 DAYS, Disp: , Rfl:  .  Vitamin D, Ergocalciferol, (DRISDOL) 1.25 MG (50000 UNIT) CAPS capsule, Take 50,000 Units by mouth once a week., Disp: , Rfl:   Patient Active Problem List   Diagnosis Date Noted  . Gastroesophageal reflux disease 04/13/2020  . Class 2 obesity with body mass index (  BMI) of 35.0 to 35.9 in adult 01/20/2020  . Vitamin D deficiency 01/20/2020  . Prediabetes 01/20/2020  . Insulin resistance 01/20/2020  . Metabolic syndrome 27/78/2423  . Allergic rhinitis 11/23/2016  . Allergic conjunctivitis 01/17/2016  . Mild intermittent asthma 01/17/2016  . Dermatitis, eczematoid 06/21/2015  . H/O cesarean section 06/21/2015  . History of diabetes mellitus arising in pregnancy 06/21/2015  . Headache, migraine 06/21/2015    Past Surgical History:  Procedure Laterality Date  . CESAREAN SECTION N/A 05/20/2013   Procedure: CESAREAN SECTION;  Surgeon: Princess Bruins, MD;  Location: [facility];  Service: Obstetrics;  Laterality: N/A;  primary  . CESAREAN SECTION N/A 02/04/2018   Procedure: Repeat CESAREAN SECTION;  Surgeon:  Aloha Gell, MD;  Location: [facility];  Service: Obstetrics;  Laterality: N/A;  EDD: 02/16/18  . CHOLECYSTECTOMY N/A 09/28/2018   Procedure: LAPAROSCOPIC CHOLECYSTECTOMY;  Surgeon: Coralie Keens, MD;  Location: [facility];  Service: General;  Laterality: N/A;  . DILATION AND CURETTAGE OF UTERUS  2009  . SALPINGOOPHORECTOMY  2009   left  . UNILATERAL SALPINGECTOMY Right 02/04/2018   Procedure: UNILATERAL SALPINGECTOMY;  Surgeon: Aloha Gell, MD;  Location: [facility];  Service: Obstetrics;  Laterality: Right;  . WISDOM TOOTH EXTRACTION      Family History  Problem Relation Age of Onset  . Diabetes Father   . Hypertension Father   . Cancer Maternal Grandfather        prostate  . Heart disease Paternal Grandfather   . Migraines Mother   . Hypertension Mother   . Breast cancer Mother   . Multiple sclerosis Sister     Social History   Tobacco Use  . Smoking status: Never Smoker  . Smokeless tobacco: Never Used  Vaping Use  . Vaping Use: Never used  Substance Use Topics  . Alcohol use: Never  . Drug use: No     Allergies  Allergen Reactions  . Dust Mite Mixed Allergen Ext [Mite (D. Farinae)] Anaphylaxis  . Other Anaphylaxis and Other (See Comments)    DUST ROACH    Health Maintenance  Topic Date Due  . Hepatitis C Screening  Never done  . COVID-19 Vaccine (1) Never done  . PAP SMEAR-Modifier  02/17/2022  . TETANUS/TDAP  01/16/2026  . INFLUENZA VACCINE  Completed  . HIV Screening  Completed    Chart Review Today: I personally reviewed active problem list, medication list, allergies, family history, social history, health maintenance, notes from last encounter, lab results, imaging with the patient/caregiver today.  Review of Systems  10 Systems reviewed and are negative for acute change except as noted in the HPI.  Objective:   Vitals:   07/16/20 0939  BP: 120/76  Pulse: 92  Resp: 18  Temp: 98.9 F (37.2 C)  TempSrc: Oral  SpO2: 93%   Weight: 195 lb (88.5 kg)  Height: 5' 3"  (1.6 m)    Body mass index is 34.54 kg/m.  Physical Exam Vitals and nursing note reviewed.  Constitutional:      General: She is not in acute distress.    Appearance: Normal appearance. She is well-developed. She is obese. She is not ill-appearing, toxic-appearing or diaphoretic.     Interventions: Face mask in place.  HENT:     Head: Normocephalic and atraumatic.     Right Ear: External ear normal.     Left Ear: External ear normal.  Eyes:     General: Lids are normal. No scleral icterus.  Right eye: No discharge.        Left eye: No discharge.     Conjunctiva/sclera: Conjunctivae normal.  Neck:     Trachea: Phonation normal. No tracheal deviation.  Cardiovascular:     Rate and Rhythm: Normal rate and regular rhythm.     Pulses: Normal pulses.          Radial pulses are 2+ on the right side and 2+ on the left side.     Heart sounds: Normal heart sounds. No murmur heard.  No friction rub. No gallop.   Pulmonary:     Effort: Pulmonary effort is normal. No respiratory distress.     Breath sounds: Normal breath sounds. No stridor. No wheezing, rhonchi or rales.  Chest:     Chest wall: No tenderness.  Abdominal:     General: Bowel sounds are normal. There is no distension.     Palpations: Abdomen is soft.     Tenderness: There is no abdominal tenderness. There is no right CVA tenderness, left CVA tenderness or guarding.  Skin:    General: Skin is warm and dry.     Coloration: Skin is not jaundiced or pale.  Neurological:     Mental Status: She is alert.     Motor: No abnormal muscle tone.     Gait: Gait normal.  Psychiatric:        Mood and Affect: Mood normal.        Speech: Speech normal.        Behavior: Behavior normal.         Assessment & Plan:     ICD-10-CM   1. Prediabetes  R73.03 CANCELED: Hemoglobin A1c   last A1C normal, shes lost some weight, will wait for next OV   2. Gastroesophageal reflux disease,  unspecified whether esophagitis present  K21.9    waiting for f/up with GI, advised PPI can add pepcid bid, f/up with GI  3. Metabolic syndrome  S34.19 CANCELED: Hemoglobin A1c   see #7&#8  4. Encounter for medication monitoring  Z51.81 CANCELED: COMPLETE METABOLIC PANEL WITH GFR    CANCELED: Hemoglobin A1c  5. Mild intermittent asthma with acute exacerbation  J45.21    currently well controlled - on flovent and rescue albuterol and duonebs  6. Encounter for hepatitis C screening test for low risk patient  Z11.59 CANCELED: Hepatitis C Antibody  7. Insulin resistance  E88.81 CANCELED: Hemoglobin A1c   continue metformin as tolerated, continue healthy diet and exercise, pt will do labs at next OV  8. Class 2 obesity with body mass index (BMI) of 35.0 to 35.9 in adult, unspecified obesity type, unspecified whether serious comorbidity present  E66.9 CANCELED: Hemoglobin A1c   Z68.35    continue diet/lifestyle efforts - other options discussed med weight management, trying to get other weightloss meds approved     Return in about 4 months (around 11/15/2020) for weight/prediabetes/med f/up with labs.   [name], PA-C 07/16/20 10:03 AM

## 2020-07-16 ENCOUNTER — Ambulatory Visit (INDEPENDENT_AMBULATORY_CARE_PROVIDER_SITE_OTHER): Payer: 59 | Admitting: Family Medicine

## 2020-07-16 ENCOUNTER — Other Ambulatory Visit: Payer: Self-pay

## 2020-07-16 ENCOUNTER — Encounter: Payer: Self-pay | Admitting: Family Medicine

## 2020-07-16 VITALS — BP 120/76 | HR 92 | Temp 98.9°F | Resp 18 | Ht 63.0 in | Wt 195.0 lb

## 2020-07-16 DIAGNOSIS — K219 Gastro-esophageal reflux disease without esophagitis: Secondary | ICD-10-CM

## 2020-07-16 DIAGNOSIS — E8881 Metabolic syndrome: Secondary | ICD-10-CM | POA: Diagnosis not present

## 2020-07-16 DIAGNOSIS — E88819 Insulin resistance, unspecified: Secondary | ICD-10-CM

## 2020-07-16 DIAGNOSIS — J4521 Mild intermittent asthma with (acute) exacerbation: Secondary | ICD-10-CM

## 2020-07-16 DIAGNOSIS — Z5181 Encounter for therapeutic drug level monitoring: Secondary | ICD-10-CM

## 2020-07-16 DIAGNOSIS — Z23 Encounter for immunization: Secondary | ICD-10-CM

## 2020-07-16 DIAGNOSIS — Z6835 Body mass index (BMI) 35.0-35.9, adult: Secondary | ICD-10-CM

## 2020-07-16 DIAGNOSIS — R7303 Prediabetes: Secondary | ICD-10-CM | POA: Diagnosis not present

## 2020-07-16 DIAGNOSIS — E669 Obesity, unspecified: Secondary | ICD-10-CM

## 2020-07-16 DIAGNOSIS — E66812 Obesity, class 2: Secondary | ICD-10-CM

## 2020-07-16 DIAGNOSIS — Z1159 Encounter for screening for other viral diseases: Secondary | ICD-10-CM

## 2020-08-01 ENCOUNTER — Ambulatory Visit: Payer: 59 | Admitting: Gastroenterology

## 2020-08-01 ENCOUNTER — Encounter: Payer: Self-pay | Admitting: Gastroenterology

## 2020-08-01 VITALS — BP 128/86 | HR 104 | Ht 63.0 in | Wt 195.0 lb

## 2020-08-01 DIAGNOSIS — R140 Abdominal distension (gaseous): Secondary | ICD-10-CM | POA: Diagnosis not present

## 2020-08-01 DIAGNOSIS — R6881 Early satiety: Secondary | ICD-10-CM | POA: Diagnosis not present

## 2020-08-01 DIAGNOSIS — K219 Gastro-esophageal reflux disease without esophagitis: Secondary | ICD-10-CM | POA: Diagnosis not present

## 2020-08-01 NOTE — Progress Notes (Signed)
[facility] Consult Note:  History: Sheri Malone 08/01/2020  Referring provider: [name], PA  Reason for consult/chief complaint: New Patient (Initial Visit), Gastroesophageal Reflux, Gas, and Bloated   Subjective  HPI:  This is a 34 year old patient referred to [name] by primary care and accompanied by her husband for longstanding upper digestive symptoms.  For least 15 years she has had a feeling of regurgitation and a burning in the neck that is generally under good control on once daily Protonix.  She has been on several PPIs over the years and this 1 seems to work best.  If she misses a dose or 2 she will definitely have symptoms.  She is also bothered by upper abdominal dull discomfort gas and bloating.  She does not have nocturnal regurgitation nor does she have dysphagia. She also has lower abdominal cramps and diarrhea that were previously an occasional occurrence but much more prominent since starting Metformin this year.  She hopes that with repeat blood work in a few months she may be able to get off this medicine.  She has not previously seen a GI specialist and does not believe she has had H. pylori testing.  From June 25th PCP visit: "GERD: 15 years - Current medication regimen: protonix 40 mg daily Has been on PPI and meds for 15 years - Possible Triggers:  Triggered by everything - water, any food, severe without ppi daily - Endorses:  Bloating, reflux, epigastric burning, throat discomfort, intermittent abdominal pain, chest pain, cough- last month black stools- may have been due to a med she took."  ROS:  Review of Systems  Constitutional: Negative for appetite change and unexpected weight change.  HENT: Negative for mouth sores and voice change.   Eyes: Negative for pain and redness.  Respiratory: Negative for cough and shortness of breath.   Cardiovascular: Negative for chest pain and palpitations.  Genitourinary: Negative for dysuria  and hematuria.  Musculoskeletal: Negative for arthralgias and myalgias.       Right foot and ankle pain after recent rollerskating injury, currently in an orthopedic boot  Skin: Negative for pallor and rash.  Allergic/Immunologic: Positive for environmental allergies.  Neurological: Negative for weakness and headaches.  Hematological: Negative for adenopathy.     Past Medical History: Past Medical History:  Diagnosis Date  . Abnormal Pap smear   . Asthma   . GERD (gastroesophageal reflux disease)   . Gestational HTN   . Headache(784.0)   . History of ectopic pregnancy   . History of gestational diabetes   . Low hemoglobin    on ironspan  . Rhinitis   . Vaginal Pap smear, abnormal      Past Surgical History: Past Surgical History:  Procedure Laterality Date  . CESAREAN SECTION N/A 05/20/2013   Procedure: CESAREAN SECTION;  Surgeon: Princess Bruins, MD;  Location: [facility];  Service: Obstetrics;  Laterality: N/A;  primary  . CESAREAN SECTION N/A 02/04/2018   Procedure: Repeat CESAREAN SECTION;  Surgeon: Aloha Gell, MD;  Location: [facility];  Service: Obstetrics;  Laterality: N/A;  EDD: 02/16/18  . CHOLECYSTECTOMY N/A 09/28/2018   Procedure: LAPAROSCOPIC CHOLECYSTECTOMY;  Surgeon: Coralie Keens, MD;  Location: [facility];  Service: General;  Laterality: N/A;  . DILATION AND CURETTAGE OF UTERUS  2009  . SALPINGOOPHORECTOMY  2009   left  . UNILATERAL SALPINGECTOMY Right 02/04/2018   Procedure: UNILATERAL SALPINGECTOMY;  Surgeon: Aloha Gell, MD;  Location: [facility];  Service: Obstetrics;  Laterality: Right;  .  WISDOM TOOTH EXTRACTION       Family History: Family History  Problem Relation Age of Onset  . Diabetes Father   . Hypertension Father   . Cancer Maternal Grandfather        prostate  . Heart disease Paternal Grandfather   . Migraines Mother   . Hypertension Mother   . Breast cancer Mother   . Multiple sclerosis Sister     Social  History: Social History   Socioeconomic History  . Marital status: Single    Spouse name: Not on file  . Number of children: Not on file  . Years of education: Not on file  . Highest education level: Not on file  Occupational History  . Not on file  Tobacco Use  . Smoking status: Never Smoker  . Smokeless tobacco: Never Used  Vaping Use  . Vaping Use: Never used  Substance and Sexual Activity  . Alcohol use: Never  . Drug use: No  . Sexual activity: Yes    Partners: Male  Other Topics Concern  . Not on file  Social History Narrative   ** Merged History Encounter **       Lives at home with husband and daughter. Pregnant with baby boy due 02/16/18. Right handed Drinks 2 cups of caffeine daily   Social Determinants of Health   Financial Resource Strain:   . Difficulty of Paying Living Expenses: Not on file  Food Insecurity:   . Worried About Charity fundraiser in the Last Year: Not on file  . Ran Out of Food in the Last Year: Not on file  Transportation Needs:   . Lack of Transportation (Medical): Not on file  . Lack of Transportation (Non-Medical): Not on file  Physical Activity:   . Days of Exercise per Week: Not on file  . Minutes of Exercise per Session: Not on file  Stress:   . Feeling of Stress : Not on file  Social Connections:   . Frequency of Communication with Friends and Family: Not on file  . Frequency of Social Gatherings with Friends and Family: Not on file  . Attends Religious Services: Not on file  . Active Member of Clubs or Organizations: Not on file  . Attends Archivist Meetings: Not on file  . Marital Status: Not on file    Allergies: Allergies  Allergen Reactions  . Dust Mite Mixed Allergen Ext [Mite (D. Farinae)] Anaphylaxis  . Other Anaphylaxis and Other (See Comments)    DUST ROACH    Outpatient Meds: Current Outpatient Medications  Medication Sig Dispense Refill  . albuterol (PROVENTIL HFA;VENTOLIN HFA) 108 (90 Base)  MCG/ACT inhaler Inhale 1 puff into the lungs every 6 (six) hours as needed for wheezing or shortness of breath.    Marland Kitchen azelastine (ASTELIN) 0.1 % nasal spray INHALE 1 2 PUFFS IN EACH NOSTRIL TWICE A DAY    . azelastine (OPTIVAR) 0.05 % ophthalmic solution PUT 1 DROP INTO AFFECTED EYE 2 TIMES A DAY    . Cholecalciferol (VITAMIN D3) 50 MCG (2000 UT) CAPS Take 2,000 Units by mouth.    Cristy Friedlander HFA 110 MCG/ACT inhaler USE 2 PUFFS TWICE A DAY    . fluticasone (FLONASE) 50 MCG/ACT nasal spray INHALE 1 2 SPRAYS IN EACH NOSTRIL ONCE A DAY    . ipratropium-albuterol (DUONEB) 0.5-2.5 (3) MG/3ML SOLN Take 3 mLs by nebulization every 6 (six) hours as needed (for wheeze, coughing spells, chest tightness, SOB). 180 mL 1  .  JUNEL 1/20 1-20 MG-MCG tablet Take 1 tablet by mouth daily.    Marland Kitchen levocetirizine (XYZAL) 5 MG tablet Take 5 mg by mouth every evening.    . metFORMIN (GLUCOPHAGE-XR) 500 MG 24 hr tablet Take 2 tablets (1,000 mg total) by mouth 2 (two) times daily with a meal. 120 tablet 3  . montelukast (SINGULAIR) 10 MG tablet Take 10 mg by mouth every evening.    . pantoprazole (PROTONIX) 40 MG tablet Take 1 tablet (40 mg total) by mouth in the morning. 90 tablet 1  . Respiratory Therapy Supplies (NEBULIZER/TUBING/MOUTHPIECE) KIT Disp one nebulizer machine, tubing set and mouthpiece kit 1 kit 0  . Rimegepant Sulfate (NURTEC) 75 MG TBDP 1 tab 1 day prior to start of menses, daily during menses, and 1 tab after completion of menses 8 tablet 0  . Spacer/Aero-Holding Chambers (OPTICHAMBER DIAMOND) MISC USE AS DIRECTED FOR INHALATION FOR 30 DAYS     No current facility-administered medications for this visit.      ___________________________________________________________________ Objective   Exam:  BP 128/86   Pulse (!) 104   Ht 5' 3"  (1.6 m)   Wt 195 lb (88.5 kg)   SpO2 97%   BMI 34.54 kg/m    General: Well-appearing.  Antalgic gait with orthopedic boot, gets on exam table without  assistance  Eyes: sclera anicteric, no redness  ENT: oral mucosa moist without lesions, no cervical or supraclavicular lymphadenopathy  CV: RRR without murmur, S1/S2, no JVD, no peripheral edema  Resp: clear to auscultation bilaterally, normal RR and effort noted  GI: soft, mild epigastric tenderness, with active bowel sounds. No guarding or palpable organomegaly noted.  Skin; warm and dry, no rash or jaundice noted  Neuro: awake, alert and oriented x 3. Normal gross motor function and fluent speech  Labs:  CBC Latest Ref Rng & Units 04/13/2020 09/22/2018 08/26/2018  WBC 3.8 - 10.8 Thousand/uL 5.6 9.9 8.3  Hemoglobin 11.7 - 15.5 g/dL 11.9 12.1 11.3(L)  Hematocrit 35 - 45 % 37.3 40.2 35.2  Platelets 140 - 400 Thousand/uL 259 325 316   CMP Latest Ref Rng & Units 04/13/2020 08/26/2018 10/14/2017  Glucose 65 - 99 mg/dL 87 85 128(H)  BUN 7 - 25 mg/dL 8 11 7   Creatinine 0.50 - 1.10 mg/dL 0.84 0.82 0.53  Sodium 135 - 146 mmol/L 137 139 139  Potassium 3.5 - 5.3 mmol/L 4.0 4.0 3.7  Chloride 98 - 110 mmol/L 103 106 108  CO2 20 - 32 mmol/L 27 25 21(L)  Calcium 8.6 - 10.2 mg/dL 9.0 9.0 8.6(L)  Total Protein 6.1 - 8.1 g/dL 6.4 6.2 6.6  Total Bilirubin 0.2 - 1.2 mg/dL 0.6 0.4 0.5  Alkaline Phos 38 - 126 U/L - - 33(L)  AST 10 - 30 U/L 11 10 17   ALT 6 - 29 U/L 9 9 15    Hgb A1c = 5.4 in June  Abdominal ultrasound 2018 with gallstones during pregnancy, had eventual cholecystectomy  Assessment: Encounter Diagnoses  Name Primary?  . Gastroesophageal reflux disease, unspecified whether esophagitis present Yes  . Early satiety   . Abdominal bloating     Some symptoms consistent with reflux including regurgitation and a pyrosis centered in the neck, symptoms which are improved but require daily PPI for many years.  Other symptoms less typical for reflux with upper abdominal dull discomfort bloating and gas.  She may have a coexisting IBS-like picture, difficult to tell since it is much worse  on the Metformin.  We had a  discussion about reflux with diagrams shown, and discussed the benefit but also limitation of acid suppression therapy and the need for diet and lifestyle changes. Plan:  Upper endoscopy with biopsy to exclude H. pylori.  She is agreeable after discussion of procedure and risks. The benefits and risks of the planned procedure were described in detail with the patient or (when appropriate) their health care proxy.  Risks were outlined as including, but not limited to, bleeding, infection, perforation, adverse medication reaction leading to cardiac or pulmonary decompensation, pancreatitis (if ERCP).  The limitation of incomplete mucosal visualization was also discussed.  No guarantees or warranties were given.    Thank you for the courtesy of this consult.  Please call me with any questions or concerns.  Nelida Meuse III  CC: Referring provider noted above

## 2020-08-01 NOTE — Patient Instructions (Signed)
If you are age 36 or older, your body mass index should be between 23-30. Your Body mass index is 34.54 kg/m. If this is out of the aforementioned range listed, please consider follow up with your Primary Care Provider.  If you are age 64 or younger, your body mass index should be between 19-25. Your Body mass index is 34.54 kg/m. If this is out of the aformentioned range listed, please consider follow up with your Primary Care Provider.   You have been scheduled for an endoscopy. Please follow written instructions given to you at your visit today. If you use inhalers (even only as needed), please bring them with you on the day of your procedure.  It was a pleasure to see you today!  Dr. Danis  

## 2020-08-15 DIAGNOSIS — M25571 Pain in right ankle and joints of right foot: Secondary | ICD-10-CM | POA: Insufficient documentation

## 2020-08-15 DIAGNOSIS — M25671 Stiffness of right ankle, not elsewhere classified: Secondary | ICD-10-CM | POA: Insufficient documentation

## 2020-09-03 ENCOUNTER — Encounter: Payer: Self-pay | Admitting: Gastroenterology

## 2020-09-05 ENCOUNTER — Encounter: Payer: Self-pay | Admitting: Gastroenterology

## 2020-09-05 ENCOUNTER — Ambulatory Visit (AMBULATORY_SURGERY_CENTER): Payer: 59 | Admitting: Gastroenterology

## 2020-09-05 ENCOUNTER — Other Ambulatory Visit: Payer: Self-pay

## 2020-09-05 VITALS — BP 111/75 | HR 81 | Temp 98.2°F | Resp 17 | Ht 63.0 in | Wt 195.0 lb

## 2020-09-05 DIAGNOSIS — K2950 Unspecified chronic gastritis without bleeding: Secondary | ICD-10-CM | POA: Diagnosis not present

## 2020-09-05 DIAGNOSIS — K219 Gastro-esophageal reflux disease without esophagitis: Secondary | ICD-10-CM

## 2020-09-05 DIAGNOSIS — K317 Polyp of stomach and duodenum: Secondary | ICD-10-CM | POA: Diagnosis not present

## 2020-09-05 MED ORDER — SODIUM CHLORIDE 0.9 % IV SOLN
500.0000 mL | Freq: Once | INTRAVENOUS | Status: DC
Start: 2020-09-05 — End: 2020-09-05

## 2020-09-05 NOTE — Progress Notes (Signed)
Pt's states no medical or surgical changes since previsit or office visit. 

## 2020-09-05 NOTE — Patient Instructions (Signed)
Resume previous diet Continue current medications  await pathology results Return to Dr. Myrtie Neither office at appt to be scheduled  YOU HAD AN ENDOSCOPIC PROCEDURE TODAY AT THE [facility]:   Refer to the procedure report that was given to you for any specific questions about what was found during the examination.  If the procedure report does not answer your questions, please call your gastroenterologist to clarify.  If you requested that your care partner not be given the details of your procedure findings, then the procedure report has been included in a sealed envelope for you to review at your convenience later.  YOU SHOULD EXPECT: Some feelings of bloating in the abdomen. Passage of more gas than usual.  Walking can help get rid of the air that was put into your GI tract during the procedure and reduce the bloating. If you had a lower endoscopy (such as a colonoscopy or flexible sigmoidoscopy) you may notice spotting of blood in your stool or on the toilet paper. If you underwent a bowel prep for your procedure, you may not have a normal bowel movement for a few days.  Please Note:  You might notice some irritation and congestion in your nose or some drainage.  This is from the oxygen used during your procedure.  There is no need for concern and it should clear up in a day or so.  SYMPTOMS TO REPORT IMMEDIATELY:   Following upper endoscopy (EGD)  Vomiting of blood or coffee ground material  New chest pain or pain under the shoulder blades  Painful or persistently difficult swallowing  New shortness of breath  Fever of 100F or higher  Black, tarry-looking stools  For urgent or emergent issues, a gastroenterologist can be reached at any hour by calling [phone]. Do not use MyChart messaging for urgent concerns.   DIET:  We do recommend a small meal at first, but then you may proceed to your regular diet.  Drink plenty of fluids but you should avoid alcoholic beverages for  24 hours.  ACTIVITY:  You should plan to take it easy for the rest of today and you should NOT DRIVE or use heavy machinery until tomorrow (because of the sedation medicines used during the test).    FOLLOW UP: Our staff will call the number listed on your records 48-72 hours following your procedure to check on you and address any questions or concerns that you may have regarding the information given to you following your procedure. If we do not reach you, we will leave a message.  We will attempt to reach you two times.  During this call, we will ask if you have developed any symptoms of COVID 19. If you develop any symptoms (ie: fever, flu-like symptoms, shortness of breath, cough etc.) before then, please call [phone].  If you test positive for Covid 19 in the 2 weeks post procedure, please call and report this information to [name].    If any biopsies were taken you will be contacted by phone or by letter within the next 1-3 weeks.  Please call us at [phone] if you have not heard about the biopsies in 3 weeks.   SIGNATURES/CONFIDENTIALITY: You and/or your care partner have signed paperwork which will be entered into your electronic medical record.  These signatures attest to the fact that that the information above on your After Visit Summary has been reviewed and is understood.  Full responsibility of the confidentiality of this discharge information lies  with you and/or your care-partner.

## 2020-09-05 NOTE — Progress Notes (Signed)
A/ox3, pleased with MAC, report to RN 

## 2020-09-05 NOTE — Op Note (Signed)
[facility] Patient Name: Sheri Malone Procedure Date: 09/05/2020 9:52 AM MRN: [id number] Endoscopist: Sherilyn Cooter L. Myrtie Neither , MD Age: 34 Referring MD:  Date of Birth: May 24, 1986 Gender: Female Account #: [id number] Procedure:                Upper GI endoscopy Indications:              Epigastric abdominal pain, Esophageal reflux                            symptoms that persist despite appropriate therapy,                            Abdominal bloating Medicines:                Monitored Anesthesia Care Procedure:                Pre-Anesthesia Assessment:                           - Prior to the procedure, a History and Physical                            was performed, and patient medications and                            allergies were reviewed. The patient's tolerance of                            previous anesthesia was also reviewed. The risks                            and benefits of the procedure and the sedation                            options and risks were discussed with the patient.                            All questions were answered, and informed consent                            was obtained. Prior Anticoagulants: The patient has                            taken no previous anticoagulant or antiplatelet                            agents. ASA Grade Assessment: II - A patient with                            mild systemic disease. After reviewing the risks                            and benefits, the patient was deemed in  satisfactory condition to undergo the procedure.                           After obtaining informed consent, the endoscope was                            passed under direct vision. Throughout the                            procedure, the patient's blood pressure, pulse, and                            oxygen saturations were monitored continuously. The                            Endoscope was introduced through the  mouth, and                            advanced to the second part of duodenum. The upper                            GI endoscopy was accomplished without difficulty.                            The patient tolerated the procedure well. Scope In: Scope Out: Findings:                 The esophagus was normal except for a patulous LES.                           There is no endoscopic evidence of Barrett's                            esophagus, esophagitis, hiatal hernia or stricture                            in the entire esophagus.                           Multiple sessile fundic gland polyps were found in                            the gastric fundus and in the gastric body.                           Normal mucosa was found in the entire examined                            stomach. Biopsies were taken with a cold forceps                            for histology. ([place] protocol).                           The exam  of the stomach was otherwise normal,                            including on retroflexion. (Hill Grade 3)                           The examined duodenum was normal. Complications:            No immediate complications. Estimated Blood Loss:     Estimated blood loss was minimal. Impression:               - Normal esophagus. Patulous LES                           - Multiple fundic gland polyps.                           - Normal mucosa was found in the entire stomach.                            Biopsied.                           - Normal examined duodenum. Recommendation:           - Patient has a contact number available for                            emergencies. The signs and symptoms of potential                            delayed complications were discussed with the                            patient. Return to normal activities tomorrow.                            Written discharge instructions were provided to the                            patient.                            - Resume previous diet.                           - Continue present medications.                           - Await pathology results.                           - Return to my office at appointment to be                            scheduled. Jobani Sabado L. Myrtie Neither, MD 09/05/2020 10:15:50 AM This report has been signed electronically.

## 2020-09-05 NOTE — Progress Notes (Signed)
C.W. vital signs. 

## 2020-09-07 ENCOUNTER — Telehealth: Payer: Self-pay | Admitting: *Deleted

## 2020-09-07 NOTE — Telephone Encounter (Signed)
1. Have you developed a fever since your procedure? NO  2.   Have you had an respiratory symptoms (SOB or cough) since your procedure? NO  3.   Have you tested positive for COVID 19 since your procedure NO  4.   Have you had any family members/close contacts diagnosed with the COVID 19 since your procedure?  NO   If yes to any of these questions please route to Laverna Peace, RN and Karlton Lemon, RN Follow up Call-  Call back number 09/05/2020  Post procedure Call Back phone  # [phone]  Permission to leave phone message Yes  Some recent data might be hidden     Patient questions:  Do you have a fever, pain , or abdominal swelling? No. Pain Score  0 *  Have you tolerated food without any problems? Yes.    Have you been able to return to your normal activities? Yes.    Do you have any questions about your discharge instructions: Diet   No. Medications  No. Follow up visit  No.  Do you have questions or concerns about your Care? No.  Actions: * If pain score is 4 or above: No action needed, pain <4.

## 2020-09-25 LAB — HM PAP SMEAR: HM Pap smear: NEGATIVE

## 2020-10-09 ENCOUNTER — Ambulatory Visit (INDEPENDENT_AMBULATORY_CARE_PROVIDER_SITE_OTHER): Payer: 59 | Admitting: Gastroenterology

## 2020-10-09 ENCOUNTER — Encounter: Payer: Self-pay | Admitting: Gastroenterology

## 2020-10-09 DIAGNOSIS — K219 Gastro-esophageal reflux disease without esophagitis: Secondary | ICD-10-CM | POA: Diagnosis not present

## 2020-10-09 DIAGNOSIS — R140 Abdominal distension (gaseous): Secondary | ICD-10-CM

## 2020-10-09 DIAGNOSIS — K529 Noninfective gastroenteritis and colitis, unspecified: Secondary | ICD-10-CM

## 2020-10-09 NOTE — Patient Instructions (Addendum)
If you are age 36 or older, your body mass index should be between 23-30. Your Body mass index is 35.61 kg/m. If this is out of the aforementioned range listed, please consider follow up with your Primary Care Provider.  If you are age 32 or younger, your body mass index should be between 19-25. Your Body mass index is 35.61 kg/m. If this is out of the aformentioned range listed, please consider follow up with your Primary Care Provider.   You have been scheduled for an Upper GI Series at [facility]. Your appointment is on 10-18-20 at 9:30am. Please arrive 15 minutes prior to your test for registration. Make sure not to eat or drink anything after midnight on the night before your test. If you need to reschedule, please call radiology at [phone]. ____________________________________________________________ An upper GI series uses x rays to help diagnose problems of the upper GI tract, which includes the esophagus, stomach, and duodenum. The duodenum is the first part of the small intestine. An upper GI series is conducted by a radiology technologist or a radiologist--a doctor who specializes in x-ray imaging--at a hospital or outpatient center. While sitting or standing in front of an x-ray machine, the patient drinks barium liquid, which is often white and has a chalky consistency and taste. The barium liquid coats the lining of the upper GI tract and makes signs of disease show up more clearly on x rays. X-ray video, called fluoroscopy, is used to view the barium liquid moving through the esophagus, stomach, and duodenum. Additional x rays and fluoroscopy are performed while the patient lies on an x-ray table. To fully coat the upper GI tract with barium liquid, the technologist or radiologist may press on the abdomen or ask the patient to change position. Patients hold still in various positions, allowing the technologist or radiologist to take x rays of the upper GI tract at different  angles. If a technologist conducts the upper GI series, a radiologist will later examine the images to look for problems.  This test typically takes about 1 hour to complete.  Due to recent changes in healthcare laws, you may see the results of your imaging and laboratory studies on MyChart before your provider has had a chance to review them.  We understand that in some cases there may be results that are confusing or concerning to you. Not all laboratory results come back in the same time frame and the provider may be waiting for multiple results in order to interpret others.  Please give [name] 48 hours in order for your provider to thoroughly review all the results before contacting the office for clarification of your results.   ____________________________________________________________  Food Guidelines for those with chronic digestive trouble:  Many people have difficulty digesting certain foods, causing a variety of distressing and embarrassing symptoms such as abdominal pain, bloating and gas.  These foods may need to be avoided or consumed in small amounts.  Here are some tips that might be helpful for you.  1.   Lactose intolerance is the difficulty or complete inability to digest lactose, the natural sugar in milk and anything made from milk.  This condition is harmless, common, and can begin any time during life.  Some people can digest a modest amount of lactose while others cannot tolerate any.  Also, not all dairy products contain equal amounts of lactose.  For example, hard cheeses such as parmesan have less lactose than soft cheeses such as cheddar.  Yogurt  has less lactose than milk or cheese.  Many packaged foods (even many brands of bread) have milk, so read ingredient lists carefully.  It is difficult to test for lactose intolerance, so just try avoiding lactose as much as possible for a week and see what happens with your symptoms.  If you seem to be lactose intolerant, the best plan  is to avoid it (but make sure you get calcium from another source).  The next best thing is to use lactase enzyme supplements, available over the counter everywhere.  Just know that many lactose intolerant people need to take several tablets with each serving of dairy to avoid symptoms.  Lastly, a lot of restaurant food is made with milk or butter.  Many are things you might not suspect, such as mashed potatoes, rice and pasta (cooked with butter) and "grilled" items.  If you are lactose intolerant, it never hurts to ask your server what has milk or butter.  2.   Fiber is an important part of your diet, but not all fiber is well-tolerated.  Insoluble fiber such as bran is often consumed by normal gut bacteria and converted into gas.  Soluble fiber such as oats, squash, carrots and green beans are typically tolerated better.  3.   Some types of carbohydrates can be poorly digested.  Examples include: fructose (apples, cherries, pears, raisins and other dried fruits), fructans (onions, zucchini, large amounts of wheat), sorbitol/mannitol/xylitol and sucralose/Splenda (common artificial sweeteners), and raffinose (lentils, broccoli, cabbage, asparagus, brussel sprouts, many types of beans).  Do a Programmer, multimedia for [employer] [place] and you will find helpful information. Beano, a dietary supplement, will often help with raffinose-containing foods.  As with lactase tablets, you may need several per serving.  4.   Whenever possible, avoid processed food&meats and chemical additives.  High fructose corn syrup, a common sweetener, may be difficult to digest.  Eggs and soy (comes from the soybean, and added to many foods now) are other common bloating/gassy foods.  5.  Regarding gluten:  gluten is a protein mainly found in wheat, but also rye and barley.  There is a condition called celiac sprue, which is an inflammatory reaction in the small intestine causing a variety of digestive symptoms.  Blood testing is highly  reliable to look for this condition, and sometimes upper endoscopy with small bowel biopsies may be necessary to make the diagnosis.  Many patients who test negative for celiac sprue report improvement in their digestive symptoms when they switch to a gluten-free diet.  However, in these "non-celiac gluten sensitive" patients, the true role of gluten in their symptoms is unclear.  Reducing carbohydrates in general may decrease the gas and bloating caused when gut bacteria consume carbs. Also, some of these patients may actually be intolerant of the baker's yeast in bread products rather than the gluten.  Flatbread and other reduced yeast breads might therefore be tolerated.  There is no specific testing available for most food intolerances, which are discovered mainly by dietary elimination.  Please do not embark on a gluten free diet unless directed by your doctor, as it is highly restrictive, and may lead to nutritional deficiencies if not carefully monitored.  Lastly, beware of internet claims offering "personalized" tests for food intolerances.  Such testing has no reliable scientific evidence to support its reliability and correlation to symptoms.    6.  The best advice is old advice, especially for those with chronic digestive trouble - try to eat "clean".  Balanced diet, avoid processed food, plenty of fruits and vegetables, cut down the sugar, minimal alcohol, avoid tobacco. Make time to care for yourself, get enough sleep, exercise when you can, reduce stress.  Your guts will thank you for it.   - Dr. Sherlynn [facility] __________________________________

## 2020-10-09 NOTE — Progress Notes (Signed)
[facility] Progress Note  Chief Complaint: GERD  Subjective  History: Follow-up for chronic upper abdominal dull pain, bloating, reflux symptoms persisting despite PPI therapy.  Possible IBS-like picture with cramps and diarrhea, though difficult to determine with concomitant Metformin therapy. EGD findings on 09/05/2020: Normal esophagus. Patulous LES - Multiple fundic gland polyps. - Normal mucosa was found in the entire stomach. Biopsied. - Normal examined duodenum."  No H. pylori on biopsies. ____________   Sheri Malone feels much like before, with intermittent regurgitation and heartburn, typically in the evening.  She takes her Protonix at bedtime, and I suggested she reduce it to supper meal. She denies dysphagia or odynophagia. Still has been bloating and a semiformed to loose stool every day to every other day.  She has an upcoming primary care appointment and hopes to get off the Metformin.  On further history, she describes feelings of heartburn and regurgitation dating back to high school, even when she recalls being only 115 pounds and not having diabetes.  ROS: Cardiovascular:  no chest pain Respiratory: no dyspnea Right ankle pain improving, currently in a brace and out of the boot. Remainder of systems negative except as above The patient's Past Medical, Family and Social History were reviewed and are on file in the EMR.  Objective:  Med list reviewed  Current Outpatient Medications:  .  albuterol (PROVENTIL HFA;VENTOLIN HFA) 108 (90 Base) MCG/ACT inhaler, Inhale 1 puff into the lungs every 6 (six) hours as needed for wheezing or shortness of breath., Disp: , Rfl:  .  azelastine (ASTELIN) 0.1 % nasal spray, INHALE 1 2 PUFFS IN EACH NOSTRIL TWICE A DAY, Disp: , Rfl:  .  azelastine (OPTIVAR) 0.05 % ophthalmic solution, PUT 1 DROP INTO AFFECTED EYE 2 TIMES A DAY, Disp: , Rfl:  .  Cholecalciferol (VITAMIN D3) 50 MCG (2000 UT) CAPS, Take 2,000 Units by mouth.,  Disp: , Rfl:  .  EPINEPHrine 0.3 mg/0.3 mL IJ SOAJ injection, epinephrine 0.3 mg/0.3 mL injection, auto-injector  INJECT 0.3 MLS (0.3 MG TOTAL) INTO THE MUSCLE ONCE FOR 1 DOSE., Disp: , Rfl:  .  FLOVENT HFA 110 MCG/ACT inhaler, USE 2 PUFFS TWICE A DAY, Disp: , Rfl:  .  fluticasone (FLONASE) 50 MCG/ACT nasal spray, INHALE 1 2 SPRAYS IN EACH NOSTRIL ONCE A DAY, Disp: , Rfl:  .  ipratropium-albuterol (DUONEB) 0.5-2.5 (3) MG/3ML SOLN, Take 3 mLs by nebulization every 6 (six) hours as needed (for wheeze, coughing spells, chest tightness, SOB)., Disp: 180 mL, Rfl: 1 .  levocetirizine (XYZAL) 5 MG tablet, Take 5 mg by mouth every evening., Disp: , Rfl:  .  metFORMIN (GLUCOPHAGE-XR) 500 MG 24 hr tablet, Take 2 tablets (1,000 mg total) by mouth 2 (two) times daily with a meal., Disp: 120 tablet, Rfl: 3 .  montelukast (SINGULAIR) 10 MG tablet, Take 10 mg by mouth every evening., Disp: , Rfl:  .  pantoprazole (PROTONIX) 40 MG tablet, Take 1 tablet (40 mg total) by mouth in the morning., Disp: 90 tablet, Rfl: 1 .  Respiratory Therapy Supplies (NEBULIZER/TUBING/MOUTHPIECE) KIT, Disp one nebulizer machine, tubing set and mouthpiece kit, Disp: 1 kit, Rfl: 0 .  Rimegepant Sulfate (NURTEC) 75 MG TBDP, 1 tab 1 day prior to start of menses, daily during menses, and 1 tab after completion of menses, Disp: 8 tablet, Rfl: 0 .  Spacer/Aero-Holding Chambers (OPTICHAMBER DIAMOND) MISC, USE AS DIRECTED FOR INHALATION FOR 30 DAYS, Disp: , Rfl:    Vital signs in last 24  hrs: Vitals:   10/09/20 1324  BP: 128/78  Pulse: 100    Physical Exam  Well-appearing, normal vocal quality  HEENT: sclera anicteric, oral mucosa moist without lesions  Neck: supple, no thyromegaly, JVD or lymphadenopathy  Cardiac: RRR without murmurs, S1S2 heard, no peripheral edema  Pulm: clear to auscultation bilaterally, normal RR and effort noted  Abdomen: soft, no tenderness, with active bowel sounds. No guarding or palpable  hepatosplenomegaly.  Skin; warm and dry, no jaundice or rash  Labs:   ___________________________________________ Radiologic studies:   ____________________________________________ Other: Biopsy negative for H. pylori as noted above  _____________________________________________ Assessment & Plan  Assessment: Encounter Diagnoses  Name Primary?  . Gastroesophageal reflux disease without esophagitis Yes  . Abdominal bloating   . Chronic diarrhea     The bloating and intermittent loose stool had been present before the Metformin, but she did not consider it much of an issue and thought it was probably just due to think she ate, especially after having a cholecystectomy. I also hope she can get off the Metformin sooner perhaps be changed to another agent since it appears to be largely responsible for the bloating and altered bowel habits.  I gave her some written bloating and gas dietary advice that I hope will be helpful  Regarding reflux, it has been present for decades and persist despite diet and lifestyle changes.  I recommended trying to move her largest meal the day to midday rather than supper and not laying down within a few hours after evening meal as well as retiming her PPI.  Nevertheless, she appears to be a good candidate for TIF procedure.  She had no erosive evidence of reflux on recent EGD, but that is often the case while on acid suppression therapy.  If we can demonstrate reflux either by barium study or pH study, then I would like her to speak with Dr. Bryan Lemma to consider TIF.  Plan: Upper GI series supine and standing.  Report to follow, further consideration of TIF.   30 minutes were spent on this encounter (including chart review, history/exam, counseling/coordination of care, and documentation)  Nelida Meuse III

## 2020-10-16 ENCOUNTER — Other Ambulatory Visit: Payer: Self-pay | Admitting: Family Medicine

## 2020-10-16 ENCOUNTER — Other Ambulatory Visit: Payer: Self-pay | Admitting: Adult Health

## 2020-10-16 DIAGNOSIS — K219 Gastro-esophageal reflux disease without esophagitis: Secondary | ICD-10-CM

## 2020-10-18 ENCOUNTER — Other Ambulatory Visit: Payer: Self-pay

## 2020-10-18 ENCOUNTER — Ambulatory Visit (HOSPITAL_COMMUNITY)
Admission: RE | Admit: 2020-10-18 | Discharge: 2020-10-18 | Disposition: A | Discharge status: Home / Self Care | Payer: 59 | Source: Ambulatory Visit | Attending: Gastroenterology | Admitting: Gastroenterology

## 2020-10-18 DIAGNOSIS — K529 Noninfective gastroenteritis and colitis, unspecified: Secondary | ICD-10-CM | POA: Diagnosis present

## 2020-10-18 DIAGNOSIS — R140 Abdominal distension (gaseous): Secondary | ICD-10-CM | POA: Diagnosis present

## 2020-10-18 DIAGNOSIS — K219 Gastro-esophageal reflux disease without esophagitis: Secondary | ICD-10-CM | POA: Diagnosis present

## 2020-10-23 ENCOUNTER — Telehealth: Payer: Self-pay | Admitting: General Surgery

## 2020-10-23 ENCOUNTER — Encounter: Payer: Self-pay | Admitting: Family Medicine

## 2020-10-23 NOTE — Telephone Encounter (Signed)
Dr. Barron Alvine said he would be happy to meet with her. I believe he forwarded this to his CMA, but please check with them to be sure so office visit can be arranged.  - HD

## 2020-10-23 NOTE — Telephone Encounter (Signed)
Scheduled the patient for appointment to Discuss TIF  On 11/13/2020

## 2020-10-23 NOTE — Telephone Encounter (Signed)
-----   Message from Shellia Cleverly, DO sent at 10/23/2020  8:33 AM EST ----- Thanks Sherilyn Cooter. Agree, looks to be a good candidate for antireflux options.   French Ana, Can you please schedule OV with me to discuss TIF. Thanks.  VC  ----- Message ----- From: Sherrilyn Rist, MD Sent: 10/22/2020  10:46 AM EST To: Shellia Cleverly, DO  Vito,    Please see my recent EGD and office notes.  I think this patient is a good candidate for TIF.  - HD

## 2020-11-05 ENCOUNTER — Ambulatory Visit: Payer: 59 | Admitting: Adult Health

## 2020-11-13 ENCOUNTER — Ambulatory Visit: Payer: 59 | Admitting: Gastroenterology

## 2020-11-13 ENCOUNTER — Encounter: Payer: Self-pay | Admitting: Gastroenterology

## 2020-11-13 VITALS — BP 126/88 | HR 101 | Ht 63.0 in | Wt 200.5 lb

## 2020-11-13 DIAGNOSIS — E669 Obesity, unspecified: Secondary | ICD-10-CM | POA: Diagnosis not present

## 2020-11-13 DIAGNOSIS — Z6835 Body mass index (BMI) 35.0-35.9, adult: Secondary | ICD-10-CM | POA: Diagnosis not present

## 2020-11-13 DIAGNOSIS — K219 Gastro-esophageal reflux disease without esophagitis: Secondary | ICD-10-CM

## 2020-11-13 NOTE — Progress Notes (Signed)
P  Chief Complaint:    GERD, discuss antireflux surgical options   HPI:    Sheri Malone is a 35 year old female referred to me by Dr. Loletha Carrow for evaluation of possible antireflux intervention with Transoral Incisionless Fundoplication (TIF) with a goal to stop or significantly reduce acid suppression therapy.  She reports a longstanding history of reflux dating back to high school, with index symptoms of heartburn, regurgitation. Worse with supine denser foods as outlined below. Sleeps with HOB elevated and avoids eating within 2-3 hours of bedtime, but still with occasional breakthrough nocturnal symptoms.  GERD history: -Index symptoms: Regurgitation, heartburn, raspy voice, occasional dry cough, globus sensation.  No dysphagia -Exacerbating features: Supine, spicy, acidic foods, tomato bases sauces, freid/greasy foods, alcohol -Medications trialed: Protonix, Nexium, Prilosec, Zantac, Pepcid, Tums -Current medications: Protonix 40 mg pre-dinner -Complications:   GERD evaluation: -Last EGD: 09/05/2020 -Barium esophagram: 09/2020: Normal motility, no significant hiatal hernia, moderate gastroesophageal reflux to the level of aortic arch -Esophageal Manometry: None -pH/Impedance: N/A -Bravo: N/A  Endoscopic History: -EGD (08/2020, Dr. Loletha Carrow): Patulous LES (Hill grade 3), fundic gland polyps, otherwise normal esophagus   GERD-HRQL Questionnaire Score: 28/50 (on PPI)    Review of systems:     No chest pain, no SOB, no fevers, no urinary sx   Past Medical History:  Diagnosis Date  . Abnormal Pap smear   . Asthma   . GERD (gastroesophageal reflux disease)   . Gestational HTN   . Headache(784.0)   . History of ectopic pregnancy   . History of gestational diabetes   . Low hemoglobin    on ironspan  . Rhinitis   . Vaginal Pap smear, abnormal     Patient's surgical history, family medical history, social history, medications and allergies were all reviewed in Epic     Current Outpatient Medications  Medication Sig Dispense Refill  . albuterol (PROVENTIL HFA;VENTOLIN HFA) 108 (90 Base) MCG/ACT inhaler Inhale 1 puff into the lungs every 6 (six) hours as needed for wheezing or shortness of breath.    Marland Kitchen azelastine (ASTELIN) 0.1 % nasal spray INHALE 1 2 PUFFS IN EACH NOSTRIL TWICE A DAY    . azelastine (OPTIVAR) 0.05 % ophthalmic solution PUT 1 DROP INTO AFFECTED EYE 2 TIMES A DAY    . Cholecalciferol (VITAMIN D3) 50 MCG (2000 UT) CAPS Take 2,000 Units by mouth.    . EPINEPHrine 0.3 mg/0.3 mL IJ SOAJ injection epinephrine 0.3 mg/0.3 mL injection, auto-injector  INJECT 0.3 MLS (0.3 MG TOTAL) INTO THE MUSCLE ONCE FOR 1 DOSE.    Marland Kitchen FLOVENT HFA 110 MCG/ACT inhaler USE 2 PUFFS TWICE A DAY    . fluticasone (FLONASE) 50 MCG/ACT nasal spray INHALE 1 2 SPRAYS IN EACH NOSTRIL ONCE A DAY    . ipratropium-albuterol (DUONEB) 0.5-2.5 (3) MG/3ML SOLN Take 3 mLs by nebulization every 6 (six) hours as needed (for wheeze, coughing spells, chest tightness, SOB). 180 mL 1  . levocetirizine (XYZAL) 5 MG tablet Take 5 mg by mouth every evening.    . metFORMIN (GLUCOPHAGE-XR) 500 MG 24 hr tablet Take 2 tablets (1,000 mg total) by mouth 2 (two) times daily with a meal. 120 tablet 3  . montelukast (SINGULAIR) 10 MG tablet Take 10 mg by mouth every evening.    . pantoprazole (PROTONIX) 40 MG tablet TAKE 1 TABLET (40 MG TOTAL) BY MOUTH IN THE MORNING. 30 tablet 5  . Respiratory Therapy Supplies (NEBULIZER/TUBING/MOUTHPIECE) KIT Disp one nebulizer machine, tubing set and mouthpiece kit  1 kit 0  . Rimegepant Sulfate (NURTEC) 75 MG TBDP 1 tab 1 day prior to start of menses, daily during menses, and 1 tab after completion of menses 8 tablet 0  . Spacer/Aero-Holding Chambers (OPTICHAMBER DIAMOND) MISC USE AS DIRECTED FOR INHALATION FOR 30 DAYS     No current facility-administered medications for this visit.    Physical Exam:     BP 126/88   Pulse (!) 101   Ht _0  (1.6 m)   Wt 200  lb 8 oz (90.9 kg)   BMI 35.52 kg/m   GENERAL:  Pleasant female in NAD PSYCH: : Cooperative, normal affect Musculoskeletal:  Normal muscle tone, normal strength NEURO: Alert and oriented x 3, no focal neurologic deficits   IMPRESSION and PLAN:    78) GERD -35 year old female with longstanding history of reflux. Symptoms much improved with PPI, but occasional breakthrough symptoms. She is interested in antireflux surgical options as a means to better control her reflux and potentially stop or significantly reduce the need for acid suppression medications. -She clearly has objective evidence of reflux on barium esophagram. No additional Bravo or pH/impedance testing needed -I personally reviewed the images from her recent upper endoscopy. Agree that it appears to be a Hill grade 3 valve with a good amount of LES laxity. Suspect some degree of hiatal hernia, although no hiatal hernia appreciated on barium esophagram, albeit brief views. -We discussed antireflux surgical options at length today, to include TIF, [place], Stretta, Nissen fundoplication, partial fundoplication. We additionally discussed the potential for laparoscopic hiatal hernia repair, and ultimately she would like to move forward with preoperative assessment -Repeat EGD for preoperative assessment to determine TIF vs cTIF -Continue PPI for now -Continue antireflux lifestyle modifications -Discussed the risks, benefits, alternatives of TIF at length today, to include postoperative dietary/activity restrictions  2) Obesity (BMI 35.5): -Likely contributing to ongoing reflux symptoms -Encourage modest weight loss through diet/exercise with goal BMI <35 prior to any antireflux surgery    The indications, risks, and benefits of EGD were explained to the patient in detail. Risks include but are not limited to bleeding, perforation, adverse reaction to medications, and cardiopulmonary compromise. Sequelae include but are not limited to the  possibility of surgery, hospitalization, and mortality. The patient verbalized understanding and wished to proceed. All questions answered, referred to scheduler. Further recommendations pending results of the exam.    Lavena Bullion ,DO, FACG 11/13/2020, 2:32 PM   CC: Wilfrid Lund, MD

## 2020-11-13 NOTE — Patient Instructions (Signed)
If you are age 37 or older, your body mass index should be between 23-30. Your Body mass index is 35.52 kg/m. If this is out of the aforementioned range listed, please consider follow up with your Primary Care Provider.  If you are age 73 or younger, your body mass index should be between 19-25. Your Body mass index is 35.52 kg/m. If this is out of the aformentioned range listed, please consider follow up with your Primary Care Provider.   You have been scheduled for an endoscopy. Please follow written instructions given to you at your visit today. If you use inhalers (even only as needed), please bring them with you on the day of your procedure.  It was a pleasure to see you today!  Vito Cirigliano, D.O.

## 2020-11-14 ENCOUNTER — Ambulatory Visit: Payer: 59 | Admitting: Podiatry

## 2020-11-14 ENCOUNTER — Other Ambulatory Visit: Payer: Self-pay

## 2020-11-14 ENCOUNTER — Encounter: Payer: Self-pay | Admitting: Podiatry

## 2020-11-14 DIAGNOSIS — L600 Ingrowing nail: Secondary | ICD-10-CM | POA: Diagnosis not present

## 2020-11-14 NOTE — Patient Instructions (Signed)
Place 1/4 cup of epsom salts in a quart of warm tap water.  Submerge your foot or feet in the solution and soak for 20 minutes.  This soak should be done twice a day.  Next, remove your foot or feet from solution, blot dry the affected area. Apply ointment and cover if instructed by your doctor.   IF YOUR SKIN BECOMES IRRITATED WHILE USING THESE INSTRUCTIONS, IT IS OKAY TO SWITCH TO  WHITE VINEGAR AND WATER.  As another alternative soak, you may use antibacterial soap and water.  Monitor for any signs/symptoms of infection. Call the office immediately if any occur or go directly to the emergency room. Call with any questions/concerns.  Ingrown Toenail An ingrown toenail occurs when the corner or sides of a toenail grow into the surrounding skin. This causes discomfort and pain. The big toe is most commonly affected, but any of the toes can be affected. If an ingrown toenail is not treated, it can become infected. What are the causes? This condition may be caused by:  Wearing shoes that are too small or tight.  An injury, such as stubbing your toe or having your toe stepped on.  Improper cutting or care of your toenails.  Having nail or foot abnormalities that were present from birth (congenital abnormalities), such as having a nail that is too big for your toe. What increases the risk? The following factors may make you more likely to develop ingrown toenails:  Age. Nails tend to get thicker with age, so ingrown nails are more common among older people.  Cutting your toenails incorrectly, such as cutting them very short or cutting them unevenly. An ingrown toenail is more likely to get infected if you have:  Diabetes.  Blood flow (circulation) problems. What are the signs or symptoms? Symptoms of an ingrown toenail may include:  Pain, soreness, or tenderness.  Redness.  Swelling.  Hardening of the skin that surrounds the toenail. Signs that an ingrown toenail may be infected  include:  Fluid or pus.  Symptoms that get worse instead of better. How is this diagnosed? An ingrown toenail may be diagnosed based on your medical history, your symptoms, and a physical exam. If you have fluid or blood coming from your toenail, a sample may be collected to test for the specific type of bacteria that is causing the infection. How is this treated? Treatment depends on how severe your ingrown toenail is. You may be able to care for your toenail at home.  If you have an infection, you may be prescribed antibiotic medicines.  If you have fluid or pus draining from your toenail, your health care provider may drain it.  If you have trouble walking, you may be given crutches to use.  If you have a severe or infected ingrown toenail, you may need a procedure to remove part or all of the nail. Follow these instructions at home: Foot care  Do not pick at your toenail or try to remove it yourself.  Soak your foot in warm, soapy water. Do this for 20 minutes, 3 times a day, or as often as told by your health care provider. This helps to keep your toe clean and keep your skin soft.  Wear shoes that fit well and are not too tight. Your health care provider may recommend that you wear open-toed shoes while you heal.  Trim your toenails regularly and carefully. Cut your toenails straight across to prevent injury to the skin at the corners   of the toenail. Do not cut your nails in a curved shape.  Keep your feet clean and dry to help prevent infection.   Medicines  Take over-the-counter and prescription medicines only as told by your health care provider.  If you were prescribed an antibiotic, take it as told by your health care provider. Do not stop taking the antibiotic even if you start to feel better. Activity  Return to your normal activities as told by your health care provider. Ask your health care provider what activities are safe for you.  Avoid activities that cause  pain. General instructions  If your health care provider told you to use crutches to help you move around, use them as instructed.  Keep all follow-up visits as told by your health care provider. This is important. Contact a health care provider if:  You have more redness, swelling, pain, or other symptoms that do not improve with treatment.  You have fluid, blood, or pus coming from your toenail. Get help right away if:  You have a red streak on your skin that starts at your foot and spreads up your leg.  You have a fever. Summary  An ingrown toenail occurs when the corner or sides of a toenail grow into the surrounding skin. This causes discomfort and pain. The big toe is most commonly affected, but any of the toes can be affected.  If an ingrown toenail is not treated, it can become infected.  Fluid or pus draining from your toenail is a sign of infection. Your health care provider may need to drain it. You may be given antibiotics to treat the infection.  Trimming your toenails regularly and properly can help you prevent an ingrown toenail. This information is not intended to replace advice given to you by your health care provider. Make sure you discuss any questions you have with your health care provider. Document Revised: 01/28/2019 Document Reviewed: 06/24/2017 Elsevier Patient Education  2021 [employer]vier Inc.  

## 2020-11-15 ENCOUNTER — Encounter: Payer: Self-pay | Admitting: Family Medicine

## 2020-11-15 ENCOUNTER — Ambulatory Visit (INDEPENDENT_AMBULATORY_CARE_PROVIDER_SITE_OTHER): Payer: 59 | Admitting: Family Medicine

## 2020-11-15 VITALS — BP 122/74 | HR 94 | Temp 98.1°F | Resp 16 | Ht 63.0 in | Wt 198.7 lb

## 2020-11-15 DIAGNOSIS — K5901 Slow transit constipation: Secondary | ICD-10-CM | POA: Insufficient documentation

## 2020-11-15 DIAGNOSIS — J4521 Mild intermittent asthma with (acute) exacerbation: Secondary | ICD-10-CM

## 2020-11-15 DIAGNOSIS — Z6835 Body mass index (BMI) 35.0-35.9, adult: Secondary | ICD-10-CM

## 2020-11-15 DIAGNOSIS — E669 Obesity, unspecified: Secondary | ICD-10-CM

## 2020-11-15 DIAGNOSIS — Z1159 Encounter for screening for other viral diseases: Secondary | ICD-10-CM

## 2020-11-15 DIAGNOSIS — R718 Other abnormality of red blood cells: Secondary | ICD-10-CM | POA: Insufficient documentation

## 2020-11-15 DIAGNOSIS — G43909 Migraine, unspecified, not intractable, without status migrainosus: Secondary | ICD-10-CM

## 2020-11-15 DIAGNOSIS — Z5181 Encounter for therapeutic drug level monitoring: Secondary | ICD-10-CM

## 2020-11-15 DIAGNOSIS — E8881 Metabolic syndrome: Secondary | ICD-10-CM | POA: Diagnosis not present

## 2020-11-15 DIAGNOSIS — D509 Iron deficiency anemia, unspecified: Secondary | ICD-10-CM | POA: Insufficient documentation

## 2020-11-15 DIAGNOSIS — R7303 Prediabetes: Secondary | ICD-10-CM

## 2020-11-15 DIAGNOSIS — Z3041 Encounter for surveillance of contraceptive pills: Secondary | ICD-10-CM

## 2020-11-15 MED ORDER — NORETHINDRONE ACET-ETHINYL EST 1-20 MG-MCG PO TABS: 1.0000 | ORAL_TABLET | Freq: Every day | ORAL | 3 refills | Status: DC

## 2020-11-15 NOTE — Progress Notes (Signed)
Name: CALYSSA ZOBRIST   MRN: [id number]    DOB: 1985-11-06   Date:11/15/2020       Progress Note  Chief Complaint  Patient presents with  . Follow-up     Subjective:   JENAN ELLEGOOD is a 35 y.o. female, presents to clinic for routine f/up and to get OCP now filled here instead of continuing with GYN  GERD - did upper GI and referred to surgeon - on protonix still  High reading for BP at PT, no hx of HTN - Gyn was not going to continue OCP due to BP reading No CP, SOB, Has "(different from her normal migraines) palpitations BP has been good except for at PT when she was in pain. BP Readings from Last 3 Encounters:  11/15/20 122/74  11/13/20 126/88  10/09/20 128/78   Birth control refill for abnormal periods - she does have tubal - hx of anemia, iron deficiency/microcytosis  Prediabetes Lab Results  Component Value Date   HGBA1C 5.4 04/13/2020  on metformin, tries to eat healthy  Hx of allergies and asthma - managed by specialists - everything is well controlled On flonase, xyzal, sinulair, flovent, asteline (for intranasal and ophthalmic), epi pen for hx of anaphylaxis, duonebs and rescue albuterol inhaler  Migraines - managed by neuro - on nurtec - getting samples since insurance may not cover and it will be expensive, works well for pt       Current Outpatient Medications:  .  albuterol (PROVENTIL HFA;VENTOLIN HFA) 108 (90 Base) MCG/ACT inhaler, Inhale 1 puff into the lungs every 6 (six) hours as needed for wheezing or shortness of breath., Disp: , Rfl:  .  azelastine (ASTELIN) 0.1 % nasal spray, INHALE 1 2 PUFFS IN EACH NOSTRIL TWICE A DAY, Disp: , Rfl:  .  azelastine (OPTIVAR) 0.05 % ophthalmic solution, PUT 1 DROP INTO AFFECTED EYE 2 TIMES A DAY, Disp: , Rfl:  .  EPINEPHrine 0.3 mg/0.3 mL IJ SOAJ injection, epinephrine 0.3 mg/0.3 mL injection, auto-injector  INJECT 0.3 MLS (0.3 MG TOTAL) INTO THE MUSCLE ONCE FOR 1 DOSE., Disp: , Rfl:  .  FLOVENT HFA 110 MCG/ACT  inhaler, USE 2 PUFFS TWICE A DAY, Disp: , Rfl:  .  fluticasone (FLONASE) 50 MCG/ACT nasal spray, INHALE 1 2 SPRAYS IN EACH NOSTRIL ONCE A DAY, Disp: , Rfl:  .  ipratropium-albuterol (DUONEB) 0.5-2.5 (3) MG/3ML SOLN, Take 3 mLs by nebulization every 6 (six) hours as needed (for wheeze, coughing spells, chest tightness, SOB)., Disp: 180 mL, Rfl: 1 .  levocetirizine (XYZAL) 5 MG tablet, Take 5 mg by mouth every evening., Disp: , Rfl:  .  metFORMIN (GLUCOPHAGE-XR) 500 MG 24 hr tablet, Take 2 tablets (1,000 mg total) by mouth 2 (two) times daily with a meal., Disp: 120 tablet, Rfl: 3 .  montelukast (SINGULAIR) 10 MG tablet, Take 10 mg by mouth every evening., Disp: , Rfl:  .  pantoprazole (PROTONIX) 40 MG tablet, TAKE 1 TABLET (40 MG TOTAL) BY MOUTH IN THE MORNING., Disp: 30 tablet, Rfl: 5 .  Respiratory Therapy Supplies (NEBULIZER/TUBING/MOUTHPIECE) KIT, Disp one nebulizer machine, tubing set and mouthpiece kit, Disp: 1 kit, Rfl: 0 .  Rimegepant Sulfate (NURTEC) 75 MG TBDP, 1 tab 1 day prior to start of menses, daily during menses, and 1 tab after completion of menses, Disp: 8 tablet, Rfl: 0 .  Spacer/Aero-Holding Chambers (OPTICHAMBER DIAMOND) MISC, USE AS DIRECTED FOR INHALATION FOR 30 DAYS, Disp: , Rfl:   Patient Active Problem List  Diagnosis Date Noted  . Gastroesophageal reflux disease 04/13/2020  . Class 2 obesity with body mass index (BMI) of 35.0 to 35.9 in adult 01/20/2020  . Vitamin D deficiency 01/20/2020  . Prediabetes 01/20/2020  . Insulin resistance 01/20/2020  . Metabolic syndrome 79/89/2119  . Allergic rhinitis 11/23/2016  . Allergic conjunctivitis 01/17/2016  . Mild intermittent asthma 01/17/2016  . Dermatitis, eczematoid 06/21/2015  . H/O cesarean section 06/21/2015  . History of diabetes mellitus arising in pregnancy 06/21/2015  . Headache, migraine 06/21/2015    Past Surgical History:  Procedure Laterality Date  . CESAREAN SECTION N/A 05/20/2013   Procedure: CESAREAN  SECTION;  Surgeon: Princess Bruins, MD;  Location: [facility];  Service: Obstetrics;  Laterality: N/A;  primary  . CESAREAN SECTION N/A 02/04/2018   Procedure: Repeat CESAREAN SECTION;  Surgeon: Aloha Gell, MD;  Location: [place];  Service: Obstetrics;  Laterality: N/A;  EDD: 02/16/18  . CHOLECYSTECTOMY N/A 09/28/2018   Procedure: LAPAROSCOPIC CHOLECYSTECTOMY;  Surgeon: Coralie Keens, MD;  Location: [place];  Service: General;  Laterality: N/A;  . DILATION AND CURETTAGE OF UTERUS  2009  . SALPINGOOPHORECTOMY  2009   left  . UNILATERAL SALPINGECTOMY Right 02/04/2018   Procedure: UNILATERAL SALPINGECTOMY;  Surgeon: Aloha Gell, MD;  Location: [place];  Service: Obstetrics;  Laterality: Right;  . WISDOM TOOTH EXTRACTION      Family History  Problem Relation Age of Onset  . Diabetes Father   . Hypertension Father   . Cancer Maternal Grandfather        prostate  . Heart disease Paternal Grandfather   . Migraines Mother   . Hypertension Mother   . Breast cancer Mother   . Multiple sclerosis Sister     Social History   Tobacco Use  . Smoking status: Never Smoker  . Smokeless tobacco: Never Used  Vaping Use  . Vaping Use: Never used  Substance Use Topics  . Alcohol use: Yes    Comment: occassionally/ oce a month  . Drug use: No     Allergies  Allergen Reactions  . Dust Mite Mixed Allergen Ext [Mite (D. Farinae)] Anaphylaxis  . Other Anaphylaxis and Other (See Comments)    DUST ROACH    Health Maintenance  Topic Date Due  . Hepatitis C Screening  Never done  . COVID-19 Vaccine (3 - Booster for Pfizer series) 01/02/2021  . PAP SMEAR-Modifier  02/17/2022  . TETANUS/TDAP  01/16/2026  . INFLUENZA VACCINE  Completed  . HIV Screening  Completed    Chart Review Today: I personally reviewed active problem list, medication list, allergies, family history, social history, health maintenance, notes from last encounter, lab results, imaging with the  patient/caregiver today.   Review of Systems  Constitutional: Negative.   HENT: Negative.   Eyes: Negative.   Respiratory: Negative.   Cardiovascular: Negative.   Gastrointestinal: Negative.   Endocrine: Negative.   Genitourinary: Negative.   Musculoskeletal: Negative.   Skin: Negative.   Allergic/Immunologic: Negative.   Neurological: Negative.   Hematological: Negative.   Psychiatric/Behavioral: Negative.   All other systems reviewed and are negative.    Objective:   Vitals:   11/15/20 0828  BP: 122/74  Pulse: 94  Resp: 16  Temp: 98.1 F (36.7 C)  SpO2: 99%  Weight: 198 lb 11.2 oz (90.1 kg)  Height: 5' 3"  (1.6 m)    Body mass index is 35.2 kg/m.  Physical Exam Vitals and nursing note reviewed.  Constitutional:  General: She is not in acute distress.    Appearance: Normal appearance. She is well-developed. She is obese. She is not ill-appearing, toxic-appearing or diaphoretic.     Interventions: Face mask in place.  HENT:     Head: Normocephalic and atraumatic.     Right Ear: External ear normal.     Left Ear: External ear normal.  Eyes:     General: Lids are normal. No scleral icterus.       Right eye: No discharge.        Left eye: No discharge.     Conjunctiva/sclera: Conjunctivae normal.  Neck:     Trachea: Phonation normal. No tracheal deviation.  Cardiovascular:     Rate and Rhythm: Normal rate and regular rhythm.     Pulses: Normal pulses.          Radial pulses are 2+ on the right side and 2+ on the left side.     Heart sounds: Normal heart sounds. No murmur heard. No friction rub. No gallop.   Pulmonary:     Effort: Pulmonary effort is normal. No respiratory distress.     Breath sounds: Normal breath sounds. No stridor. No wheezing, rhonchi or rales.  Chest:     Chest wall: No tenderness.  Abdominal:     General: Bowel sounds are normal. There is no distension.     Palpations: Abdomen is soft.  Musculoskeletal:     Right lower leg:  No edema.     Left lower leg: No edema.  Skin:    General: Skin is warm and dry.     Coloration: Skin is not jaundiced or pale.     Findings: No rash.  Neurological:     Mental Status: She is alert.     Motor: No abnormal muscle tone.     Gait: Gait normal.  Psychiatric:        Mood and Affect: Mood normal.        Speech: Speech normal.        Behavior: Behavior normal.         Assessment & Plan:     ICD-10-CM   1. Prediabetes  R73.03 Hemoglobin A1C    COMPLETE METABOLIC PANEL WITH GFR   on metformin, repeat labs, continue diet/lifestyle efforts  2. Class 2 obesity with body mass index (BMI) of 35.0 to 35.9 in adult, unspecified obesity type, unspecified whether serious comorbidity present  E66.9 Hemoglobin A1C   Z68.35   3. Metabolic syndrome  Y77.41 Hemoglobin A1C  4. Mild intermittent asthma with acute exacerbation  J45.21    well controlled, managed by asthma and allergy specialist  5. Microcytosis  R71.8 CBC with Differential/Platelet   recheck labs  6. Encounter for surveillance of contraceptive pills  Z30.41 norethindrone-ethinyl estradiol (JUNEL 1/20) 1-20 MG-MCG tablet   reviewed risk, SE, BP good today, benefit with heavy menses and anemia outweighs risk, will monitor BP, pt verbalized understanding  7. Migraine without status migrainosus, not intractable, unspecified migraine type  G43.909    per specialists, well controlled, [facility]  8. Iron deficiency anemia, unspecified iron deficiency anemia type  D50.9    recheck labs  9. Encounter for medication monitoring  Z51.81 Hemoglobin A1C    COMPLETE METABOLIC PANEL WITH GFR    CBC with Differential/Platelet  10. Encounter for hepatitis C screening test for low risk patient  Z11.59 Hepatitis C Antibody     Pt asked to make f/up appt for when she is due  for CPE - she believes at the end of the year - Dec 2022 f/up Sooner if needed  [name], PA-C 11/15/20 8:59 AM

## 2020-11-16 LAB — COMPLETE METABOLIC PANEL WITH GFR
AG Ratio: 1.6 (calc) (ref 1.0–2.5)
ALT: 15 U/L (ref 6–29)
AST: 14 U/L (ref 10–30)
Albumin: 4.4 g/dL (ref 3.6–5.1)
Alkaline phosphatase (APISO): 28 U/L — ABNORMAL LOW (ref 31–125)
BUN: 10 mg/dL (ref 7–25)
CO2: 29 mmol/L (ref 20–32)
Calcium: 9.8 mg/dL (ref 8.6–10.2)
Chloride: 102 mmol/L (ref 98–110)
Creat: 0.74 mg/dL (ref 0.50–1.10)
GFR, Est African American: 121 mL/min/{1.73_m2} (ref >=60)
GFR, Est Non African American: 104 mL/min/{1.73_m2} (ref >=60)
Globulin: 2.7 g/dL (calc) (ref 1.9–3.7)
Glucose, Bld: 95 mg/dL (ref 65–99)
Potassium: 4.8 mmol/L (ref 3.5–5.3)
Sodium: 138 mmol/L (ref 135–146)
Total Bilirubin: 0.5 mg/dL (ref 0.2–1.2)
Total Protein: 7.1 g/dL (ref 6.1–8.1)

## 2020-11-16 LAB — HEPATITIS C ANTIBODY
Hepatitis C Ab: NONREACTIVE
SIGNAL TO CUT-OFF: 0.01 (ref <1.00)

## 2020-11-16 LAB — CBC WITH DIFFERENTIAL/PLATELET
Absolute Monocytes: 531 cells/uL (ref 200–950)
Basophils Absolute: 62 cells/uL (ref 0–200)
Basophils Relative: 0.8 %
Eosinophils Absolute: 131 cells/uL (ref 15–500)
Eosinophils Relative: 1.7 %
HCT: 39.3 % (ref 35.0–45.0)
Hemoglobin: 12.8 g/dL (ref 11.7–15.5)
Lymphs Abs: 2580 cells/uL (ref 850–3900)
MCH: 26.1 pg — ABNORMAL LOW (ref 27.0–33.0)
MCHC: 32.6 g/dL (ref 32.0–36.0)
MCV: 80 fL (ref 80.0–100.0)
MPV: 9.6 fL (ref 7.5–12.5)
Monocytes Relative: 6.9 %
Neutro Abs: 4397 cells/uL (ref 1500–7800)
Neutrophils Relative %: 57.1 %
Platelets: 366 10*3/uL (ref 140–400)
RBC: 4.91 10*6/uL (ref 3.80–5.10)
RDW: 12.3 % (ref 11.0–15.0)
Total Lymphocyte: 33.5 %
WBC: 7.7 10*3/uL (ref 3.8–10.8)

## 2020-11-16 LAB — HEMOGLOBIN A1C
Hgb A1c MFr Bld: 5.4 % of total Hgb (ref <5.7)
Mean Plasma Glucose: 108 mg/dL
eAG (mmol/L): 6 mmol/L

## 2020-11-18 NOTE — Progress Notes (Signed)
Subjective:   Patient ID: Sheri Malone, female   DOB: 35 y.o.   MRN: [id number]   HPI Patient presents stating she has had a pain in her right big toenail stating that it is been sore and making it hard for her to wear shoe gear comfortably.  Has tried to soak it and trim it   ROS      Objective:  Physical Exam  Neurovascular status intact with incurvation pain of the right hallux medial border that is not currently draining and has low-grade redness around the area     Assessment:  Ingrown toenail deformity right hallux medial border with pain     Plan:  H&P condition reviewed recommended correction.  I allowed patient to read consent form and patient signed after review and today I infiltrated the right hallux 60 mg Xylocaine Marcaine mixture sterile prep done and using sterile instrumentation remove the medial border exposed matrix and applied phenol 3 applications 30 seconds followed by alcohol lavage and sterile dressing.  Gave instructions on soaks and reappoint and encouraged to call questions concerns

## 2020-11-23 ENCOUNTER — Encounter: Payer: Self-pay | Admitting: Gastroenterology

## 2020-12-07 ENCOUNTER — Other Ambulatory Visit: Payer: Self-pay

## 2020-12-07 ENCOUNTER — Ambulatory Visit (AMBULATORY_SURGERY_CENTER): Payer: 59 | Admitting: Gastroenterology

## 2020-12-07 ENCOUNTER — Encounter: Payer: Self-pay | Admitting: Gastroenterology

## 2020-12-07 ENCOUNTER — Telehealth: Payer: Self-pay | Admitting: Gastroenterology

## 2020-12-07 VITALS — BP 112/68 | HR 80 | Temp 97.3°F | Resp 15 | Ht 63.0 in | Wt 200.0 lb

## 2020-12-07 DIAGNOSIS — K219 Gastro-esophageal reflux disease without esophagitis: Secondary | ICD-10-CM | POA: Diagnosis not present

## 2020-12-07 DIAGNOSIS — K317 Polyp of stomach and duodenum: Secondary | ICD-10-CM

## 2020-12-07 DIAGNOSIS — K449 Diaphragmatic hernia without obstruction or gangrene: Secondary | ICD-10-CM

## 2020-12-07 MED ORDER — SODIUM CHLORIDE 0.9 % IV SOLN: 500.0000 mL | Freq: Once | INTRAVENOUS | Status: DC

## 2020-12-07 NOTE — Progress Notes (Signed)
Called to room to assist during endoscopic procedure.  Patient ID and intended procedure confirmed with present staff. Received instructions for my participation in the procedure from the performing physician.  

## 2020-12-07 NOTE — Telephone Encounter (Signed)
Patient calling to request a letter for work Monday to be available for her through [employer]

## 2020-12-07 NOTE — Progress Notes (Signed)
Pt's states no medical or surgical changes since previsit or office visit.  SB - vitals 

## 2020-12-07 NOTE — Progress Notes (Signed)
Report to PACU, RN, vss, BBS= Clear.  

## 2020-12-07 NOTE — Op Note (Signed)
[facility] Patient Name: Sheri Malone Procedure Date: 12/07/2020 9:37 AM MRN: [id number] Endoscopist: Doristine Locks , MD Age: 35 Referring MD:  Date of Birth: 1986/07/05 Gender: Female Account #: [id number] Procedure:                Upper GI endoscopy Indications:              Esophageal reflux, Preoperative assessment for                            antireflux surgery Medicines:                Monitored Anesthesia Care Procedure:                Pre-Anesthesia Assessment:                           - Prior to the procedure, a History and Physical                            was performed, and patient medications and                            allergies were reviewed. The patient's tolerance of                            previous anesthesia was also reviewed. The risks                            and benefits of the procedure and the sedation                            options and risks were discussed with the patient.                            All questions were answered, and informed consent                            was obtained. Prior Anticoagulants: The patient has                            taken no previous anticoagulant or antiplatelet                            agents. ASA Grade Assessment: II - A patient with                            mild systemic disease. After reviewing the risks                            and benefits, the patient was deemed in                            satisfactory condition to undergo the procedure.  After obtaining informed consent, the endoscope was                            passed under direct vision. Throughout the                            procedure, the patient's blood pressure, pulse, and                            oxygen saturations were monitored continuously. The                            Endoscope was introduced through the mouth, and                            advanced to the second part of duodenum.  The upper                            GI endoscopy was accomplished without difficulty.                            The patient tolerated the procedure well. Scope In: Scope Out: Findings:                 The gastroesophageal flap valve was visualized                            endoscopically and classified as Hill Grade III                            (minimal fold, loose to endoscope, hiatal hernia                            likely). There was significant LES laxity noted on                            anterograde and retroflexed views.                           A 1 cm slidign type hiatal hernia was present.                           The Z-line was regular and was found 36 cm from the                            incisors.                           The upper third of the esophagus and middle third                            of the esophagus were normal.                           Multiple small  sessile polyps with no bleeding and                            no stigmata of recent bleeding were found in the                            gastric fundus and in the gastric body. Several of                            these polyps were removed with a cold biopsy                            forceps for histologic representative evaluation.                            Resection and retrieval were complete. Estimated                            blood loss was minimal.                           The incisura, gastric antrum and pylorus were                            normal.                           The examined duodenum was normal. Complications:            No immediate complications. Estimated Blood Loss:     Estimated blood loss was minimal. Impression:               - Gastroesophageal flap valve classified as Hill                            Grade III (minimal fold, loose to endoscope, hiatal                            hernia likely).                           - 1 cm sliding type hiatal hernia.                            - Z-line regular, 36 cm from the incisors.                           - Normal upper third of esophagus and middle third                            of esophagus.                           - Multiple gastric polyps. Resected and retrieved.                           -  Normal incisura, antrum and pylorus.                           - Normal examined duodenum. Recommendation:           - Patient has a contact number available for                            emergencies. The signs and symptoms of potential                            delayed complications were discussed with the                            patient. Return to normal activities tomorrow.                            Written discharge instructions were provided to the                            patient.                           - Resume previous diet.                           - Continue present medications.                           - Await pathology results.                           - Return to GI clinic at appointment to be                            scheduled.                           - Based on endoscopic findings, I recommend                            concomittant laparposcopic hiatal hernia repair                            with Transoral Incisionless Fundoplication (cTIF).                            Will set up referral to [facility] to                            discuss combined repair of hernia and                            fundoplication, and can follow-up with Dr.                            Barron Alvine in the GI Clinic.  Doristine Locks, MD 12/07/2020 9:54:36 AM

## 2020-12-07 NOTE — Patient Instructions (Signed)
Resume previous diet Continue current medications Await pathology results Return to GI clinic at appt to be schedule Will set up procedure with [facility]  YOU HAD AN ENDOSCOPIC PROCEDURE TODAY AT THE [facility]:   Refer to the procedure report that was given to you for any specific questions about what was found during the examination.  If the procedure report does not answer your questions, please call your gastroenterologist to clarify.  If you requested that your care partner not be given the details of your procedure findings, then the procedure report has been included in a sealed envelope for you to review at your convenience later.  YOU SHOULD EXPECT: Some feelings of bloating in the abdomen. Passage of more gas than usual.  Walking can help get rid of the air that was put into your GI tract during the procedure and reduce the bloating. If you had a lower endoscopy (such as a colonoscopy or flexible sigmoidoscopy) you may notice spotting of blood in your stool or on the toilet paper. If you underwent a bowel prep for your procedure, you may not have a normal bowel movement for a few days.  Please Note:  You might notice some irritation and congestion in your nose or some drainage.  This is from the oxygen used during your procedure.  There is no need for concern and it should clear up in a day or so.  SYMPTOMS TO REPORT IMMEDIATELY:   Following upper endoscopy (EGD)  Vomiting of blood or coffee ground material  New chest pain or pain under the shoulder blades  Painful or persistently difficult swallowing  New shortness of breath  Fever of 100F or higher  Black, tarry-looking stools  For urgent or emergent issues, a gastroenterologist can be reached at any hour by calling [phone]. Do not use MyChart messaging for urgent concerns.   IET:  We do recommend a small meal at first, but then you may proceed to your regular diet.  Drink plenty of fluids  but you should avoid alcoholic beverages for 24 hours.  ACTIVITY:  You should plan to take it easy for the rest of today and you should NOT DRIVE or use heavy machinery until tomorrow (because of the sedation medicines used during the test).    FOLLOW UP: Our staff will call the number listed on your records 48-72 hours following your procedure to check on you and address any questions or concerns that you may have regarding the information given to you following your procedure. If we do not reach you, we will leave a message.  We will attempt to reach you two times.  During this call, we will ask if you have developed any symptoms of COVID 19. If you develop any symptoms (ie: fever, flu-like symptoms, shortness of breath, cough etc.) before then, please call [phone].  If you test positive for Covid 19 in the 2 weeks post procedure, please call and report this information to [name].    If any biopsies were taken you will be contacted by phone or by letter within the next 1-3 weeks.  Please call us at [phone] if you have not heard about the biopsies in 3 weeks.    SIGNATURES/CONFIDENTIALITY: You and/or your care partner have signed paperwork which will be entered into your electronic medical record.  These signatures attest to the fact that that the information above on your After Visit Summary has been reviewed and is understood.  Full responsibility of  the confidentiality of this discharge information lies with you and/or your care-partner. 

## 2020-12-11 ENCOUNTER — Telehealth: Payer: Self-pay

## 2020-12-11 NOTE — Telephone Encounter (Signed)
  Follow up Call-  Call back number 12/07/2020 09/05/2020  Post procedure Call Back phone  # [phone]  Permission to leave phone message Yes Yes  Some recent data might be hidden     Patient questions:  Do you have a fever, pain , or abdominal swelling? No. Pain Score  0 *  Have you tolerated food without any problems? Yes.    Have you been able to return to your normal activities? Yes.    Do you have any questions about your discharge instructions: Diet   No. Medications  No. Follow up visit  No.  Do you have questions or concerns about your Care? No.  Actions: * If pain score is 4 or above: No action needed, pain <4.  1. Have you developed a fever since your procedure? no  2.   Have you had an respiratory symptoms (SOB or cough) since your procedure? no  3.   Have you tested positive for COVID 19 since your procedure no  4.   Have you had any family members/close contacts diagnosed with the COVID 19 since your procedure?  no   If yes to any of these questions please route to Laverna Peace, RN and Karlton Lemon, RN

## 2020-12-14 ENCOUNTER — Encounter: Payer: Self-pay | Admitting: Gastroenterology

## 2021-02-01 ENCOUNTER — Encounter: Payer: Self-pay | Admitting: Family Medicine

## 2021-04-08 ENCOUNTER — Ambulatory Visit: Payer: Self-pay | Admitting: *Deleted

## 2021-04-08 NOTE — Telephone Encounter (Signed)
Had chest pain for a week and 1/2 and my left arm and hand numb.   It's been 2-3 weeks ago now.    I have normally some shortness of breath due to asthma. I was having allergy issues at that time.   I saw another dr for a totally different reason.  He said my VS and all were fine but that if I had chest pain again to call 911 or go to the emergency room.   He also said,  "That's not my area of expertise but chest pain you should probably go to the emergency room".     Pt had called in to change her appt that was for tomorrow she made via her MyChart and mentioned chest pain to the agent.   Agent had put the call into the nurse triage queue to have [name] touch base with the pt even though it had been 2-3 weeks ago.    Pt not having any chest pain or shortness of breath different than her usual.   No numbness of left arm or hand either.   I did instruct her if she developed chest pain again or shortness of breath different than her usual to go to the ED or call 911.   She verbalized understanding and thanked me for calling.  She has a new appt made by the agent for 05/02/2021 at 10:00 with Danelle Berry.  No protocol used since it was just to touch base with pt.

## 2021-04-09 ENCOUNTER — Ambulatory Visit: Payer: 59 | Admitting: Family Medicine

## 2021-05-02 ENCOUNTER — Encounter: Payer: Self-pay | Admitting: Family Medicine

## 2021-05-02 ENCOUNTER — Other Ambulatory Visit: Payer: Self-pay

## 2021-05-02 ENCOUNTER — Ambulatory Visit
Admission: RE | Admit: 2021-05-02 | Discharge: 2021-05-02 | Disposition: A | Discharge status: Home / Self Care | Payer: 59 | Source: Ambulatory Visit | Attending: Family Medicine | Admitting: Family Medicine

## 2021-05-02 ENCOUNTER — Ambulatory Visit
Admission: RE | Admit: 2021-05-02 | Discharge: 2021-05-02 | Disposition: A | Discharge status: Home / Self Care | Payer: 59 | Attending: Family Medicine | Admitting: Family Medicine

## 2021-05-02 ENCOUNTER — Ambulatory Visit: Payer: 59 | Admitting: Family Medicine

## 2021-05-02 VITALS — BP 128/72 | HR 93 | Temp 98.7°F | Resp 16 | Ht 63.0 in | Wt 215.0 lb

## 2021-05-02 DIAGNOSIS — R079 Chest pain, unspecified: Secondary | ICD-10-CM

## 2021-05-02 MED ORDER — BUDESONIDE-FORMOTEROL FUMARATE 160-4.5 MCG/ACT IN AERO
2.0000 | INHALATION_SPRAY | Freq: Two times a day (BID) | RESPIRATORY_TRACT | 1 refills | Status: DC
Start: 2021-05-02 — End: 2021-07-10

## 2021-05-02 NOTE — Progress Notes (Signed)
Patient ID: Sheri Malone, female    DOB: 06-19-1986, 35 y.o.   MRN: [id number]  PCP: [name], PA-C  Chief Complaint  Patient presents with   Chest Pain    Subjective:   Sheri Malone is a 35 y.o. female, presents to clinic with CC of the following:  Chest Pain  This is a new problem. Episode onset: 2 weeks. The onset quality is sudden. The problem occurs intermittently. The problem has been unchanged. The pain is present in the substernal region. The pain is at a severity of 5/10. The quality of the pain is described as sharp, squeezing and tightness. The pain does not radiate. Associated symptoms include diaphoresis, dizziness, near-syncope, numbness (tingling left arm) and shortness of breath. Pertinent negatives include no abdominal pain, back pain, claudication, cough, exertional chest pressure, fever, headaches, hemoptysis, irregular heartbeat, leg pain, lower extremity edema, malaise/fatigue, nausea, orthopnea, palpitations, PND, sputum production, syncope, vomiting or weakness. The pain is aggravated by nothing. She has tried antacids (inhaler) for the symptoms.     Patient Active Problem List   Diagnosis Date Noted   Iron deficiency anemia 11/15/2020   Slow transit constipation 11/15/2020   Microcytosis 11/15/2020   Gastro-esophageal reflux disease without esophagitis 04/13/2020   Morbid obesity ([place]) 01/20/2020   Vitamin D deficiency 01/20/2020   Prediabetes 01/20/2020   Insulin resistance 80/12/4915   Metabolic syndrome 91/50/5697   Allergic rhinitis 11/23/2016   Allergic conjunctivitis 01/17/2016   Mild intermittent asthma 01/17/2016   Dermatitis, eczematoid 06/21/2015   H/O cesarean section 06/21/2015   History of diabetes mellitus arising in pregnancy 06/21/2015   Headache, migraine 06/21/2015      Current Outpatient Medications:    albuterol (PROVENTIL HFA;VENTOLIN HFA) 108 (90 Base) MCG/ACT inhaler, Inhale 1 puff into the lungs every 6 (six) hours  as needed for wheezing or shortness of breath., Disp: , Rfl:    azelastine (ASTELIN) 0.1 % nasal spray, INHALE 1 2 PUFFS IN EACH NOSTRIL TWICE A DAY, Disp: , Rfl:    azelastine (OPTIVAR) 0.05 % ophthalmic solution, PUT 1 DROP INTO AFFECTED EYE 2 TIMES A DAY, Disp: , Rfl:    EPINEPHrine 0.3 mg/0.3 mL IJ SOAJ injection, epinephrine 0.3 mg/0.3 mL injection, auto-injector  INJECT 0.3 MLS (0.3 MG TOTAL) INTO THE MUSCLE ONCE FOR 1 DOSE., Disp: , Rfl:    FLOVENT HFA 110 MCG/ACT inhaler, USE 2 PUFFS TWICE A DAY, Disp: , Rfl:    fluticasone (FLONASE) 50 MCG/ACT nasal spray, INHALE 1 2 SPRAYS IN EACH NOSTRIL ONCE A DAY, Disp: , Rfl:    ipratropium-albuterol (DUONEB) 0.5-2.5 (3) MG/3ML SOLN, Take 3 mLs by nebulization every 6 (six) hours as needed (for wheeze, coughing spells, chest tightness, SOB)., Disp: 180 mL, Rfl: 1   levocetirizine (XYZAL) 5 MG tablet, Take 5 mg by mouth every evening., Disp: , Rfl:    meloxicam (MOBIC) 7.5 MG tablet, Take 7.5 mg by mouth 2 (two) times daily., Disp: , Rfl:    montelukast (SINGULAIR) 10 MG tablet, Take 10 mg by mouth every evening., Disp: , Rfl:    norethindrone-ethinyl estradiol (JUNEL 1/20) 1-20 MG-MCG tablet, Take 1 tablet by mouth daily., Disp: 84 tablet, Rfl: 3   pantoprazole (PROTONIX) 40 MG tablet, TAKE 1 TABLET (40 MG TOTAL) BY MOUTH IN THE MORNING., Disp: 30 tablet, Rfl: 5   Respiratory Therapy Supplies (NEBULIZER/TUBING/MOUTHPIECE) KIT, Disp one nebulizer machine, tubing set and mouthpiece kit, Disp: 1 kit, Rfl: 0   Rimegepant Sulfate (NURTEC) 75  MG TBDP, 1 tab 1 day prior to start of menses, daily during menses, and 1 tab after completion of menses, Disp: 8 tablet, Rfl: 0   Spacer/Aero-Holding Chambers (OPTICHAMBER DIAMOND) MISC, USE AS DIRECTED FOR INHALATION FOR 30 DAYS, Disp: , Rfl:    Allergies  Allergen Reactions   Dust Mite Mixed Allergen Ext [Mite (D. Farinae)] Anaphylaxis   Other Anaphylaxis and Other (See Comments)    DUST ROACH     Social  History   Tobacco Use   Smoking status: Never   Smokeless tobacco: Never  Vaping Use   Vaping Use: Never used  Substance Use Topics   Alcohol use: Yes    Comment: occassionally/ oce a month   Drug use: No      Chart Review Today: I personally reviewed active problem list, medication list, allergies, family history, social history, health maintenance, notes from last encounter, lab results, imaging with the patient/caregiver today.   Review of Systems  Constitutional:  Positive for diaphoresis. Negative for activity change, appetite change, chills, fever and malaise/fatigue.  HENT: Negative.    Eyes: Negative.   Respiratory:  Positive for shortness of breath. Negative for cough, hemoptysis and sputum production.   Cardiovascular:  Positive for chest pain and near-syncope. Negative for palpitations, orthopnea, claudication, syncope and PND.  Gastrointestinal: Negative.  Negative for abdominal pain, nausea and vomiting.  Endocrine: Negative.   Genitourinary: Negative.   Musculoskeletal: Negative.  Negative for back pain.  Skin: Negative.   Allergic/Immunologic: Negative.   Neurological:  Positive for dizziness and numbness (tingling left arm). Negative for weakness and headaches.  Hematological: Negative.   Psychiatric/Behavioral: Negative.    All other systems reviewed and are negative.     Objective:   Vitals:   05/02/21 1010  BP: 128/72  Pulse: 93  Resp: 16  Temp: 98.7 F (37.1 C)  SpO2: 98%  Weight: 215 lb (97.5 kg)  Height: 5' 3"  (1.6 m)    Body mass index is 38.09 kg/m.  Physical Exam Vitals and nursing note reviewed.  Constitutional:      General: She is not in acute distress.    Appearance: Normal appearance. She is well-developed. She is obese. She is not ill-appearing, toxic-appearing or diaphoretic.     Interventions: Face mask in place.  HENT:     Head: Normocephalic and atraumatic.     Right Ear: External ear normal.     Left Ear: External ear  normal.  Eyes:     General: Lids are normal. No scleral icterus.       Right eye: No discharge.        Left eye: No discharge.     Conjunctiva/sclera: Conjunctivae normal.  Neck:     Trachea: Phonation normal. No tracheal deviation.  Cardiovascular:     Rate and Rhythm: Normal rate and regular rhythm.     Pulses: Normal pulses.          Radial pulses are 2+ on the right side and 2+ on the left side.       Posterior tibial pulses are 2+ on the right side and 2+ on the left side.     Heart sounds: Normal heart sounds. No murmur heard.   No friction rub. No gallop.  Pulmonary:     Effort: Pulmonary effort is normal. No respiratory distress.     Breath sounds: No stridor. Wheezing present. No rhonchi or rales.  Chest:     Chest wall: No tenderness.  Abdominal:  General: Bowel sounds are normal. There is no distension.     Palpations: Abdomen is soft.     Tenderness: There is no abdominal tenderness. There is no right CVA tenderness, left CVA tenderness or guarding.  Musculoskeletal:        General: No swelling.     Right lower leg: No edema.     Left lower leg: No edema.  Skin:    General: Skin is warm and dry.     Coloration: Skin is not jaundiced or pale.     Findings: No rash.  Neurological:     Mental Status: She is alert. Mental status is at baseline.     Motor: No abnormal muscle tone.     Gait: Gait normal.  Psychiatric:        Mood and Affect: Mood normal.        Speech: Speech normal.        Behavior: Behavior normal.     Results for orders placed or performed in visit on 11/15/20  Hepatitis C Antibody  Result Value Ref Range   Hepatitis C Ab NON-REACTIVE NON-REACTI   SIGNAL TO CUT-OFF 0.01 <1.00  Hemoglobin A1C  Result Value Ref Range   Hgb A1c MFr Bld 5.4 <5.7 % of total Hgb   Mean Plasma Glucose 108 mg/dL   eAG (mmol/L) 6.0 mmol/L  COMPLETE METABOLIC PANEL WITH GFR  Result Value Ref Range   Glucose, Bld 95 65 - 99 mg/dL   BUN 10 7 - 25 mg/dL   Creat  0.74 0.50 - 1.10 mg/dL   GFR, Est Non African American 104 > OR = 60 mL/min/1.71m   GFR, Est African American 121 > OR = 60 mL/min/1.775m  BUN/Creatinine Ratio NOT APPLICABLE 6 - 22 (calc)   Sodium 138 135 - 146 mmol/L   Potassium 4.8 3.5 - 5.3 mmol/L   Chloride 102 98 - 110 mmol/L   CO2 29 20 - 32 mmol/L   Calcium 9.8 8.6 - 10.2 mg/dL   Total Protein 7.1 6.1 - 8.1 g/dL   Albumin 4.4 3.6 - 5.1 g/dL   Globulin 2.7 1.9 - 3.7 g/dL (calc)   AG Ratio 1.6 1.0 - 2.5 (calc)   Total Bilirubin 0.5 0.2 - 1.2 mg/dL   Alkaline phosphatase (APISO) 28 (L) 31 - 125 U/L   AST 14 10 - 30 U/L   ALT 15 6 - 29 U/L  CBC with Differential/Platelet  Result Value Ref Range   WBC 7.7 3.8 - 10.8 Thousand/uL   RBC 4.91 3.80 - 5.10 Million/uL   Hemoglobin 12.8 11.7 - 15.5 g/dL   HCT 39.3 35.0 - 45.0 %   MCV 80.0 80.0 - 100.0 fL   MCH 26.1 (L) 27.0 - 33.0 pg   MCHC 32.6 32.0 - 36.0 g/dL   RDW 12.3 11.0 - 15.0 %   Platelets 366 140 - 400 Thousand/uL   MPV 9.6 7.5 - 12.5 fL   Neutro Abs 4,397 1,500 - 7,800 cells/uL   Lymphs Abs 2,580 850 - 3,900 cells/uL   Absolute Monocytes 531 200 - 950 cells/uL   Eosinophils Absolute 131 15 - 500 cells/uL   Basophils Absolute 62 0 - 200 cells/uL   Neutrophils Relative % 57.1 %   Total Lymphocyte 33.5 %   Monocytes Relative 6.9 %   Eosinophils Relative 1.7 %   Basophils Relative 0.8 %   ECG interpretation   Date: 05/03/21  Rate: 90  Rhythm: normal sinus rhythm  QRS  Axis: normal  Intervals: normal  ST/T Wave abnormalities: TWI V4-V6 and non-specific T-wave abnormality II, III, aVF  Conduction Disutrbances: none  Old EKG Reviewed: none to compare to - reviewed chart and care everywhere - pt note prior cardiac w/up in [place] in the last 20 years - ecg ECHO ?      Assessment & Plan:     ICD-10-CM   1. Chest pain, unspecified type  R07.9 EKG 12-Lead    DG Chest 2 View    budesonide-formoterol (SYMBICORT) 160-4.5 MCG/ACT inhaler    Patient is a  35 year old female presents with intermittent episodes of sudden onset left upper chest pain that last for few minutes, some concerning associated symptoms however she is able to do strenuous activity and exercise without any recurrence of symptoms She does have a history of asthma possibly some bronchospasm or asthma symptoms?  She has gotten some improvement with her inhaler  EKG has multiple T wave abnormalities, no prior EKG available to compare to, discussed with patient a cardiology consult for further evaluation -but otherwise EKG was normal sinus and there was no ST elevation or depression  Patient has a normal heart rate I doubt PE With able to exercise doubt ACS Could possibly treat upper GI/GERD and see if that improves her symptoms at all -she is on Protonix will increase to twice daily for short period of time   The sudden onset of her pain that is sharp and quick is also reassuring that it is unlikely cardiac in nature  Does have some respiratory symptoms as well that seem to be related to the pain will increase her allergy and asthma management -she uses Flovent and albuterol rescue inhaler will stop the Flovent and try Symbicort for some long-acting bronchodilator coverage  Close follow-up  Reviewed extensively concerning signs and symptoms at patient needs to go to the ER for she verbalized understanding    [name], PA-C 05/02/21 10:28 AM

## 2021-05-02 NOTE — Patient Instructions (Signed)
Nonspecific Chest Pain, Adult Chest pain can be caused by many different conditions. It can be caused by a condition that is life-threatening and requires treatment right away. It can also be caused by something that is not life-threatening. If you have chest pain, it can be hard to know the difference, so it is important to get helpright away to make sure that you do not have a serious condition. Some life-threatening causes of chest pain include: Heart attack. A tear in the body's main blood vessel (aortic dissection). Inflammation around your heart (pericarditis). A problem in the lungs, such as a blood clot (pulmonary embolism) or a collapsed lung (pneumothorax). Some non life-threatening causes of chest pain include: Heartburn. Anxiety or stress. Damage to the bones, muscles, and cartilage that make up your chest wall. Pneumonia or bronchitis. Shingles infection (varicella-zoster virus). Chest pain can feel like: Pain or discomfort on the surface of your chest or deep in your chest. Crushing, pressure, aching, or squeezing pain. Burning or tingling. Dull or sharp pain that is worse when you move, cough, or take a deep breath. Pain or discomfort that is also felt in your back, neck, jaw, shoulder, or arm, or pain that spreads to any of these areas. Your chest pain may come and go. It may also be constant. Your health care provider will do lab tests and other studies to find the cause of your pain.Treatment will depend on the cause of your chest pain. Follow these instructions at home: Medicines Take over-the-counter and prescription medicines only as told by your health care provider. If you were prescribed an antibiotic, take it as told by your health care provider. Do not stop taking the antibiotic even if you start to feel better. Lifestyle  Rest as directed by your health care provider. Do not use any products that contain nicotine or tobacco, such as cigarettes and e-cigarettes.  If you need help quitting, ask your health care provider. Do not drink alcohol. Make healthy lifestyle choices as recommended. These may include: Getting regular exercise. Ask your health care provider to suggest some activities that are safe for you. Eating a heart-healthy diet. This includes plenty of fresh fruits and vegetables, whole grains, low-fat (lean) protein, and low-fat dairy products. A dietitian can help you find healthy eating options. Maintaining a healthy weight. Managing any other health conditions you have, such as high blood pressure (hypertension) or diabetes. Reducing stress, such as with yoga or relaxation techniques.  General instructions Pay attention to any changes in your symptoms. Tell your health care provider about them or any new symptoms. Avoid any activities that cause chest pain. Keep all follow-up visits as told by your health care provider. This is important. This includes visits for any further testing if your chest pain does not go away. Contact a health care provider if: Your chest pain does not go away. You feel depressed. You have a fever. Get help right away if: Your chest pain gets worse. You have a cough that gets worse, or you cough up blood. You have severe pain in your abdomen. You faint. You have sudden, unexplained chest discomfort. You have sudden, unexplained discomfort in your arms, back, neck, or jaw. You have shortness of breath at any time. You suddenly start to sweat, or your skin gets clammy. You feel nausea or you vomit. You suddenly feel lightheaded or dizzy. You have severe weakness, or unexplained weakness or fatigue. Your heart begins to beat quickly, or it feels like it is   skipping beats. These symptoms may represent a serious problem that is an emergency. Do not wait to see if the symptoms will go away. Get medical help right away. Call your local emergency services (911 in the [place] Do not drive yourself to the  hospital. Summary Chest pain can be caused by a condition that is serious and requires urgent treatment. It may also be caused by something that is not life-threatening. If you have chest pain, it is very important to see your health care provider. Your health care provider may do lab tests and other studies to find the cause of your pain. Follow your health care provider's instructions on taking medicines, making lifestyle changes, and getting emergency treatment if symptoms become worse. Keep all follow-up visits as told by your health care provider. This includes visits for any further testing if your chest pain does not go away. This information is not intended to replace advice given to you by your health care provider. Make sure you discuss any questions you have with your healthcare provider. Document Revised: 04/08/2018 Document Reviewed: 04/08/2018 Elsevier Patient Education  2022 [employer]  

## 2021-05-04 ENCOUNTER — Other Ambulatory Visit: Payer: Self-pay | Admitting: Family Medicine

## 2021-05-04 DIAGNOSIS — K219 Gastro-esophageal reflux disease without esophagitis: Secondary | ICD-10-CM

## 2021-06-03 ENCOUNTER — Other Ambulatory Visit: Payer: Self-pay

## 2021-06-03 ENCOUNTER — Ambulatory Visit: Payer: 59 | Admitting: Family Medicine

## 2021-06-03 ENCOUNTER — Encounter: Payer: Self-pay | Admitting: Family Medicine

## 2021-06-03 VITALS — BP 122/70 | HR 98 | Temp 98.4°F | Resp 16 | Ht 63.0 in | Wt 218.0 lb

## 2021-06-03 DIAGNOSIS — R9431 Abnormal electrocardiogram [ECG] [EKG]: Secondary | ICD-10-CM | POA: Diagnosis not present

## 2021-06-03 DIAGNOSIS — K219 Gastro-esophageal reflux disease without esophagitis: Secondary | ICD-10-CM | POA: Diagnosis not present

## 2021-06-03 DIAGNOSIS — R079 Chest pain, unspecified: Secondary | ICD-10-CM | POA: Diagnosis not present

## 2021-06-03 MED ORDER — PANTOPRAZOLE SODIUM 40 MG PO TBEC: 40.0000 mg | DELAYED_RELEASE_TABLET | Freq: Two times a day (BID) | ORAL | 1 refills | Status: DC

## 2021-06-03 NOTE — Progress Notes (Signed)
Patient ID: CRETA DORAME, female    DOB: 08/01/1986, 35 y.o.   MRN: [id number]  PCP: [name], PA-C  Chief Complaint  Patient presents with   Follow-up    1 month recheck    Subjective:   Sheri Malone is a 35 y.o. female, presents to clinic with CC of the following:  F/up today on upper left sided CP intermittent - pt has wheeze, hx of lung disease, tx with inhaler and PPI PPI BID dosing did seem to help with chest pain, but ran out of meds  No chest pain with exertion  Still having some tightness, doing symbicort BID montelukast Using albuterol rarely - once in the last 2 weeks     Patient Active Problem List   Diagnosis Date Noted   Iron deficiency anemia 11/15/2020   Slow transit constipation 11/15/2020   Microcytosis 11/15/2020   Gastro-esophageal reflux disease without esophagitis 04/13/2020   Morbid obesity ([place]) 01/20/2020   Vitamin D deficiency 01/20/2020   Prediabetes 01/20/2020   Insulin resistance 09/40/7680   Metabolic syndrome 88/08/314   Allergic rhinitis 11/23/2016   Allergic conjunctivitis 01/17/2016   Mild intermittent asthma 01/17/2016   Dermatitis, eczematoid 06/21/2015   H/O cesarean section 06/21/2015   History of diabetes mellitus arising in pregnancy 06/21/2015   Headache, migraine 06/21/2015      Current Outpatient Medications:    albuterol (PROVENTIL HFA;VENTOLIN HFA) 108 (90 Base) MCG/ACT inhaler, Inhale 1 puff into the lungs every 6 (six) hours as needed for wheezing or shortness of breath., Disp: , Rfl:    azelastine (ASTELIN) 0.1 % nasal spray, INHALE 1 2 PUFFS IN EACH NOSTRIL TWICE A DAY, Disp: , Rfl:    azelastine (OPTIVAR) 0.05 % ophthalmic solution, PUT 1 DROP INTO AFFECTED EYE 2 TIMES A DAY, Disp: , Rfl:    budesonide-formoterol (SYMBICORT) 160-4.5 MCG/ACT inhaler, Inhale 2 puffs into the lungs in the morning and at bedtime., Disp: 1 each, Rfl: 1   EPINEPHrine 0.3 mg/0.3 mL IJ SOAJ injection, epinephrine 0.3 mg/0.3 mL  injection, auto-injector  INJECT 0.3 MLS (0.3 MG TOTAL) INTO THE MUSCLE ONCE FOR 1 DOSE., Disp: , Rfl:    fluticasone (FLONASE) 50 MCG/ACT nasal spray, INHALE 1 2 SPRAYS IN EACH NOSTRIL ONCE A DAY, Disp: , Rfl:    ipratropium-albuterol (DUONEB) 0.5-2.5 (3) MG/3ML SOLN, Take 3 mLs by nebulization every 6 (six) hours as needed (for wheeze, coughing spells, chest tightness, SOB)., Disp: 180 mL, Rfl: 1   levocetirizine (XYZAL) 5 MG tablet, Take 5 mg by mouth every evening., Disp: , Rfl:    meloxicam (MOBIC) 7.5 MG tablet, Take 7.5 mg by mouth 2 (two) times daily., Disp: , Rfl:    montelukast (SINGULAIR) 10 MG tablet, Take 10 mg by mouth every evening., Disp: , Rfl:    norethindrone-ethinyl estradiol (JUNEL 1/20) 1-20 MG-MCG tablet, Take 1 tablet by mouth daily., Disp: 84 tablet, Rfl: 3   pantoprazole (PROTONIX) 40 MG tablet, TAKE 1 TABLET (40 MG TOTAL) BY MOUTH IN THE MORNING., Disp: 30 tablet, Rfl: 5   Respiratory Therapy Supplies (NEBULIZER/TUBING/MOUTHPIECE) KIT, Disp one nebulizer machine, tubing set and mouthpiece kit, Disp: 1 kit, Rfl: 0   Rimegepant Sulfate (NURTEC) 75 MG TBDP, 1 tab 1 day prior to start of menses, daily during menses, and 1 tab after completion of menses, Disp: 8 tablet, Rfl: 0   Spacer/Aero-Holding Chambers (OPTICHAMBER DIAMOND) MISC, USE AS DIRECTED FOR INHALATION FOR 30 DAYS, Disp: , Rfl:    Allergies  Allergen Reactions   Dust Mite Mixed Allergen Ext [Mite (D. Farinae)] Anaphylaxis   Other Anaphylaxis and Other (See Comments)    DUST ROACH     Social History   Tobacco Use   Smoking status: Never   Smokeless tobacco: Never  Vaping Use   Vaping Use: Never used  Substance Use Topics   Alcohol use: Yes    Comment: occassionally/ oce a month   Drug use: No      Chart Review Today: I personally reviewed active problem list, medication list, allergies, family history, social history, health maintenance, notes from last encounter, lab results, imaging with the  patient/caregiver today.   Review of Systems  Constitutional: Negative.   HENT: Negative.    Eyes: Negative.   Respiratory: Negative.    Cardiovascular: Negative.   Gastrointestinal: Negative.   Endocrine: Negative.   Genitourinary: Negative.   Musculoskeletal: Negative.   Skin: Negative.   Allergic/Immunologic: Negative.   Neurological: Negative.   Hematological: Negative.   Psychiatric/Behavioral: Negative.    All other systems reviewed and are negative.     Objective:   Vitals:   06/03/21 1052  BP: 122/70  Pulse: 98  Resp: 16  Temp: 98.4 F (36.9 C)  TempSrc: Oral  SpO2: 96%  Weight: 218 lb (98.9 kg)  Height: 5' 3"  (1.6 m)    Body mass index is 38.62 kg/m.  Physical Exam Vitals and nursing note reviewed.  Constitutional:      General: She is not in acute distress.    Appearance: Normal appearance. She is well-developed. She is obese. She is not ill-appearing, toxic-appearing or diaphoretic.     Interventions: Face mask in place.  HENT:     Head: Normocephalic and atraumatic.     Right Ear: External ear normal.     Left Ear: External ear normal.  Eyes:     General: Lids are normal. No scleral icterus.       Right eye: No discharge.        Left eye: No discharge.     Conjunctiva/sclera: Conjunctivae normal.  Neck:     Trachea: Phonation normal. No tracheal deviation.  Cardiovascular:     Rate and Rhythm: Normal rate and regular rhythm.     Pulses: Normal pulses.          Radial pulses are 2+ on the right side and 2+ on the left side.       Posterior tibial pulses are 2+ on the right side and 2+ on the left side.     Heart sounds: Normal heart sounds. No murmur heard.   No friction rub. No gallop.  Pulmonary:     Effort: Pulmonary effort is normal. No respiratory distress.     Breath sounds: Normal breath sounds. No stridor. No wheezing, rhonchi or rales.  Chest:     Chest wall: No tenderness.  Abdominal:     General: Bowel sounds are normal. There  is no distension.     Palpations: Abdomen is soft.     Tenderness: There is no abdominal tenderness. There is no guarding.  Musculoskeletal:        General: No swelling.     Right lower leg: No edema.     Left lower leg: No edema.  Skin:    General: Skin is warm and dry.     Capillary Refill: Capillary refill takes less than 2 seconds.     Coloration: Skin is not jaundiced or pale.  Findings: No rash.  Neurological:     Mental Status: She is alert. Mental status is at baseline.     Motor: No abnormal muscle tone.     Gait: Gait normal.  Psychiatric:        Mood and Affect: Mood normal.        Speech: Speech normal.        Behavior: Behavior normal.     Results for orders placed or performed in visit on 11/15/20  Hepatitis C Antibody  Result Value Ref Range   Hepatitis C Ab NON-REACTIVE NON-REACTI   SIGNAL TO CUT-OFF 0.01 <1.00  Hemoglobin A1C  Result Value Ref Range   Hgb A1c MFr Bld 5.4 <5.7 % of total Hgb   Mean Plasma Glucose 108 mg/dL   eAG (mmol/L) 6.0 mmol/L  COMPLETE METABOLIC PANEL WITH GFR  Result Value Ref Range   Glucose, Bld 95 65 - 99 mg/dL   BUN 10 7 - 25 mg/dL   Creat 0.74 0.50 - 1.10 mg/dL   GFR, Est Non African American 104 > OR = 60 mL/min/1.35m   GFR, Est African American 121 > OR = 60 mL/min/1.752m  BUN/Creatinine Ratio NOT APPLICABLE 6 - 22 (calc)   Sodium 138 135 - 146 mmol/L   Potassium 4.8 3.5 - 5.3 mmol/L   Chloride 102 98 - 110 mmol/L   CO2 29 20 - 32 mmol/L   Calcium 9.8 8.6 - 10.2 mg/dL   Total Protein 7.1 6.1 - 8.1 g/dL   Albumin 4.4 3.6 - 5.1 g/dL   Globulin 2.7 1.9 - 3.7 g/dL (calc)   AG Ratio 1.6 1.0 - 2.5 (calc)   Total Bilirubin 0.5 0.2 - 1.2 mg/dL   Alkaline phosphatase (APISO) 28 (L) 31 - 125 U/L   AST 14 10 - 30 U/L   ALT 15 6 - 29 U/L  CBC with Differential/Platelet  Result Value Ref Range   WBC 7.7 3.8 - 10.8 Thousand/uL   RBC 4.91 3.80 - 5.10 Million/uL   Hemoglobin 12.8 11.7 - 15.5 g/dL   HCT 39.3 35.0 - 45.0 %    MCV 80.0 80.0 - 100.0 fL   MCH 26.1 (L) 27.0 - 33.0 pg   MCHC 32.6 32.0 - 36.0 g/dL   RDW 12.3 11.0 - 15.0 %   Platelets 366 140 - 400 Thousand/uL   MPV 9.6 7.5 - 12.5 fL   Neutro Abs 4,397 1,500 - 7,800 cells/uL   Lymphs Abs 2,580 850 - 3,900 cells/uL   Absolute Monocytes 531 200 - 950 cells/uL   Eosinophils Absolute 131 15 - 500 cells/uL   Basophils Absolute 62 0 - 200 cells/uL   Neutrophils Relative % 57.1 %   Total Lymphocyte 33.5 %   Monocytes Relative 6.9 %   Eosinophils Relative 1.7 %   Basophils Relative 0.8 %   ECG interpretation   Date: 06/03/2021  Rate: 84  Rhythm: sinus rhythm  QRS Axis: normal  Intervals: normal  ST/T Wave abnormalities: nonspecific t wave abnormalities in aVF and V6 - no continuous ST elevation or depression  Conduction Disutrbances: none  Old EKG Reviewed: improvement from some of prior ECG TWI compared to 05/02/21      Assessment & Plan:     ICD-10-CM   1. Gastroesophageal reflux disease, unspecified whether esophagitis present  K21.9 pantoprazole (PROTONIX) 40 MG tablet   sx were better with BID PPI - refilled, try to wean to once daily, add pepcid bid prn, work on diet/lifestyle  efforts/triggers    2. Chest pain, unspecified type  R07.9 Ambulatory referral to Cardiology   some continued CP even with improved BS/inhaler and treating GERD, non-specific T-wave abnormalities in ECG, less TWI, cardiology consult with hx    3. T wave inversion in EKG  R94.31 EKG 12-Lead   7/14 ECG was inferior lateral, now only aVF and V6, prior extensive cardiac work up, cardiology consult locally for f/up     Note in referral to specialists - Consult for CP non-specific TWI, hx of ASD (?) with prior stress test and TEE but I cannot find records, she states was years ago in [place] - pt will have major surgery coming up, just want consult to est for f/up and preop clearance - no exertional sx, and the prior CP has largely improved with increased med tx of  asthma and GERD.  No current sx or concerns for CHF or ACS     [name], PA-C 06/03/21 10:32 AM

## 2021-06-06 ENCOUNTER — Encounter: Payer: Self-pay | Admitting: Family Medicine

## 2021-06-10 ENCOUNTER — Encounter: Payer: Self-pay | Admitting: Family Medicine

## 2021-06-14 ENCOUNTER — Other Ambulatory Visit: Payer: Self-pay | Admitting: Family Medicine

## 2021-06-14 DIAGNOSIS — K219 Gastro-esophageal reflux disease without esophagitis: Secondary | ICD-10-CM

## 2021-07-10 ENCOUNTER — Other Ambulatory Visit: Payer: Self-pay | Admitting: Family Medicine

## 2021-07-10 DIAGNOSIS — R079 Chest pain, unspecified: Secondary | ICD-10-CM

## 2021-07-19 ENCOUNTER — Other Ambulatory Visit: Payer: Self-pay

## 2021-07-19 ENCOUNTER — Ambulatory Visit: Payer: 59 | Admitting: Cardiology

## 2021-07-19 ENCOUNTER — Encounter: Payer: Self-pay | Admitting: Cardiology

## 2021-07-19 VITALS — BP 116/84 | HR 89 | Ht 63.0 in | Wt 221.0 lb

## 2021-07-19 DIAGNOSIS — K2100 Gastro-esophageal reflux disease with esophagitis, without bleeding: Secondary | ICD-10-CM

## 2021-07-19 DIAGNOSIS — R072 Precordial pain: Secondary | ICD-10-CM | POA: Diagnosis not present

## 2021-07-19 DIAGNOSIS — Z6839 Body mass index (BMI) 39.0-39.9, adult: Secondary | ICD-10-CM | POA: Diagnosis not present

## 2021-07-19 MED ORDER — METOPROLOL TARTRATE 100 MG PO TABS: 100.0000 mg | ORAL_TABLET | Freq: Once | ORAL | 0 refills | Status: DC

## 2021-07-19 MED ORDER — IVABRADINE HCL 5 MG PO TABS: 10.0000 mg | ORAL_TABLET | Freq: Once | ORAL | 0 refills | Status: AC

## 2021-07-19 NOTE — Progress Notes (Signed)
Cardiology Office Note:    Date:  07/19/2021   ID:  Sheri Malone, DOB 1986/08/22, MRN [id number]  PCP:  [name], PA-C   [facility] Providers Cardiologist:  None     Referring MD: [name], PA-C   Chief Complaint  Patient presents with   NEW patient-chest pain with occassional left hand numbness   Sheri Malone is a 35 y.o. female who is being seen today for the evaluation of chest pain at the request of [name], PA-C.   History of Present Illness:    [name] is a 35 y.o. female with a hx of asthma, GER who presents due to chest pain.  Patient states having symptoms of left-sided chest pain on and off for the past 2 months.  Symptoms are not associated with exertion.  Denies any trauma to the chest denies any worsening or alleviating factors.  Has a history of reflux, takes Protonix for this.  Symptoms are not consistent with typical reflux pain.  Past Medical History:  Diagnosis Date   Abnormal Pap smear    Asthma    GERD (gastroesophageal reflux disease)    Gestational HTN    Headache(784.0)    History of ectopic pregnancy    History of gestational diabetes    Low hemoglobin    on ironspan   Rhinitis    Vaginal Pap smear, abnormal     Past Surgical History:  Procedure Laterality Date   CESAREAN SECTION N/A 05/20/2013   Procedure: CESAREAN SECTION;  Surgeon: Princess Bruins, MD;  Location: [facility];  Service: Obstetrics;  Laterality: N/A;  primary   CESAREAN SECTION N/A 02/04/2018   Procedure: Repeat CESAREAN SECTION;  Surgeon: Aloha Gell, MD;  Location: [place];  Service: Obstetrics;  Laterality: N/A;  EDD: 02/16/18   CHOLECYSTECTOMY N/A 09/28/2018   Procedure: LAPAROSCOPIC CHOLECYSTECTOMY;  Surgeon: Coralie Keens, MD;  Location[facility];  Service: General;  Laterality: N/A;   [place] OF UTERUS  2009   SALPINGOOPHORECTOMY  2009   left   UNILATERAL SALPINGECTOMY Right 02/04/2018   Procedure: UNILATERAL  SALPINGECTOMY;  Surgeon: Aloha Gell, MD;  Location: [place];  Service: Obstetrics;  Laterality: Right;   WISDOM TOOTH EXTRACTION      Current Medications: Current Meds  Medication Sig   albuterol (PROVENTIL HFA;VENTOLIN HFA) 108 (90 Base) MCG/ACT inhaler Inhale 1 puff into the lungs every 6 (six) hours as needed for wheezing or shortness of breath.   azelastine (ASTELIN) 0.1 % nasal spray INHALE 1 2 PUFFS IN EACH NOSTRIL TWICE A DAY   azelastine (OPTIVAR) 0.05 % ophthalmic solution PUT 1 DROP INTO AFFECTED EYE 2 TIMES A DAY   EPINEPHrine 0.3 mg/0.3 mL IJ SOAJ injection epinephrine 0.3 mg/0.3 mL injection, auto-injector  INJECT 0.3 MLS (0.3 MG TOTAL) INTO THE MUSCLE ONCE FOR 1 DOSE.   fluticasone (FLONASE) 50 MCG/ACT nasal spray INHALE 1 2 SPRAYS IN EACH NOSTRIL ONCE A DAY   ipratropium-albuterol (DUONEB) 0.5-2.5 (3) MG/3ML SOLN Take 3 mLs by nebulization every 6 (six) hours as needed (for wheeze, coughing spells, chest tightness, SOB).   ivabradine (CORLANOR) 5 MG TABS tablet Take 2 tablets (10 mg total) by mouth once for 1 dose. Take 2 hours prior to your CT scan.   levocetirizine (XYZAL) 5 MG tablet Take 5 mg by mouth every evening.   meloxicam (MOBIC) 7.5 MG tablet Take 7.5 mg by mouth 2 (two) times daily.   metoprolol tartrate (LOPRESSOR) 100 MG  tablet Take 1 tablet (100 mg total) by mouth once for 1 dose. Take 2 hours prior to your CT scan.   montelukast (SINGULAIR) 10 MG tablet Take 10 mg by mouth every evening.   norethindrone-ethinyl estradiol (JUNEL 1/20) 1-20 MG-MCG tablet Take 1 tablet by mouth daily.   pantoprazole (PROTONIX) 40 MG tablet Take 1 tablet (40 mg total) by mouth 2 (two) times daily for 30 days, THEN 1 tablet (40 mg total) daily.   Respiratory Therapy Supplies (NEBULIZER/TUBING/MOUTHPIECE) KIT Disp one nebulizer machine, tubing set and mouthpiece kit   Rimegepant Sulfate (NURTEC) 75 MG TBDP 1 tab 1 day prior to start of menses, daily during menses, and 1  tab after completion of menses   Spacer/Aero-Holding Chambers (OPTICHAMBER DIAMOND) MISC    SYMBICORT 160-4.5 MCG/ACT inhaler INHALE 2 PUFFS INTO THE LUNGS IN THE MORNING AND AT BEDTIME.     Allergies:   Dust mite mixed allergen ext [mite (d. farinae)] and Other   Social History   Socioeconomic History   Marital status: Single    Spouse name: Not on file   Number of children: Not on file   Years of education: Not on file   Highest education level: Not on file  Occupational History   Not on file  Tobacco Use   Smoking status: Never   Smokeless tobacco: Never  Vaping Use   Vaping Use: Never used  Substance and Sexual Activity   Alcohol use: Yes    Comment: occassionally/once a month   Drug use: No   Sexual activity: Yes    Partners: Male  Other Topics Concern   Not on file  Social History Narrative   ** Merged History Encounter **       Lives at home with husband and daughter. Pregnant with baby boy due 02/16/18. Right handed Drinks 2 cups of caffeine daily   Social Determinants of Health   Financial Resource Strain: Not on file  Food Insecurity: Not on file  Transportation Needs: Not on file  Physical Activity: Not on file  Stress: Not on file  Social Connections: Not on file     Family History: The patient's family history includes Breast cancer in her mother; Cancer in her maternal grandfather; Diabetes in her father; Heart disease in her paternal grandfather; Hypertension in her father and mother; Migraines in her mother; Multiple sclerosis in her sister. There is no history of Stomach cancer, Rectal cancer, Esophageal cancer, or Colon cancer.  ROS:   Please see the history of present illness.     All other systems reviewed and are negative.  EKGs/Labs/Other Studies Reviewed:    The following studies were reviewed today:   EKG:  EKG is  ordered today.  The ekg ordered today demonstrates normal sinus rhythm, nonspecific T wave inversions  Recent  Labs: 11/15/2020: ALT 15; BUN 10; Creat 0.74; Hemoglobin 12.8; Platelets 366; Potassium 4.8; Sodium 138  Recent Lipid Panel    Component Value Date/Time   CHOL 138 01/27/2014 0000   TRIG 188 (A) 01/27/2014 0000   HDL 42 01/27/2014 0000   LDLCALC 58 01/27/2014 0000     Risk Assessment/Calculations:          Physical Exam:    VS:  BP 116/84 (BP Location: Right Arm, Patient Position: Sitting, Cuff Size: Large)   Pulse 89   Ht _0  (1.6 m)   Wt 221 lb (100.2 kg)   SpO2 98%   BMI 39.15 kg/m     Wt  Readings from Last 3 Encounters:  07/19/21 221 lb (100.2 kg)  06/03/21 218 lb (98.9 kg)  05/02/21 215 lb (97.5 kg)     GEN:  Well nourished, well developed in no acute distress HEENT: Normal NECK: No JVD; No carotid bruits LYMPHATICS: No lymphadenopathy CARDIAC: RRR, no murmurs, rubs, gallops RESPIRATORY:  Clear to auscultation without rales, wheezing or rhonchi  ABDOMEN: Soft, non-tender, non-distended MUSCULOSKELETAL:  No edema; No deformity  SKIN: Warm and dry NEUROLOGIC:  Alert and oriented x 3 PSYCHIATRIC:  Normal affect   ASSESSMENT:    1. Precordial pain   2. Gastroesophageal reflux disease with esophagitis without hemorrhage   3. BMI 39.0-39.9,adult    PLAN:    In order of problems listed above:  Chest pain, not consistent with prior reflux pain.  Ongoing over 2 months now.  Obtain echocardiogram, get coronary CTA to evaluate presence of CAD. History of reflux, continue Protonix as prescribed. Obesity, weight loss recommended.  Follow-up after echo and coronary CTA.     Medication Adjustments/Labs and Tests Ordered: Current medicines are reviewed at length with the patient today.  Concerns regarding medicines are outlined above.  Orders Placed This Encounter  Procedures   CT CORONARY MORPH W/CTA COR W/SCORE W/CA W/CM &/OR WO/CM   Basic metabolic panel   EKG 60-OKHT   ECHOCARDIOGRAM COMPLETE    Meds ordered this encounter  Medications   metoprolol  tartrate (LOPRESSOR) 100 MG tablet    Sig: Take 1 tablet (100 mg total) by mouth once for 1 dose. Take 2 hours prior to your CT scan.    Dispense:  1 tablet    Refill:  0   ivabradine (CORLANOR) 5 MG TABS tablet    Sig: Take 2 tablets (10 mg total) by mouth once for 1 dose. Take 2 hours prior to your CT scan.    Dispense:  2 tablet    Refill:  0     Patient Instructions  Medication Instructions:   Your physician recommends that you continue on your current medications as directed. Please refer to the Current Medication list given to you today.   *If you need a refill on your cardiac medications before your next appointment, please call your pharmacy*   Lab Work:  BMP to be drawn today.  If you have labs (blood work) drawn today and your tests are completely normal, you will receive your results only by: [place] (if you have MyChart) OR A paper copy in the mail If you have any lab test that is abnormal or we need to change your treatment, we will call you to review the results.   Testing/Procedures:  Your physician has requested that you have an echocardiogram. Echocardiography is a painless test that uses sound waves to create images of your heart. It provides your doctor with information about the size and shape of your heart and how well your heart's chambers and valves are working. This procedure takes approximately one hour. There are no restrictions for this procedure.  Your physician has requested that you have cardiac CT. Cardiac computed tomography (CT) is a painless test that uses an x-ray machine to take clear, detailed pictures of your heart.   Your cardiac CT will be scheduled at:  [facility] [address] [place], [place] [address] [phone]    Please arrive 15 mins early for check-in and test prep.    Please follow these instructions carefully (unless otherwise directed):  On the Night  Before the Test: Be sure to Drink plenty of water. Do not consume any caffeinated/decaffeinated beverages or chocolate 12 hours prior to your test.   On the Day of the Test: Drink plenty of water until 1 hour prior to the test. Do not eat any food 4 hours prior to the test. You may take your regular medications prior to the test.  Take metoprolol (Lopressor) 100 MG two hours prior to test. Take Ivabradine (Corlanor) 10 MG two hours prior to your test FEMALES- please wear underwire-free bra if available, avoid dresses & tight clothing        After the Test: Drink plenty of water. After receiving IV contrast, you may experience a mild flushed feeling. This is normal. On occasion, you may experience a mild rash up to 24 hours after the test. This is not dangerous. If this occurs, you can take Benadryl 25 mg and increase your fluid intake. If you experience trouble breathing, this can be serious. If it is severe call 911 IMMEDIATELY. If it is mild, please call our office. If you take any of these medications: Glipizide/Metformin, Avandament, Glucavance, please do not take 48 hours after completing test unless otherwise instructed.  Please allow 2-4 weeks for scheduling of routine cardiac CTs. Some insurance companies require a pre-authorization which may delay scheduling of this test.   For non-scheduling related questions, please contact the cardiac imaging nurse navigator should you have any questions/concerns: Marchia Bond, Cardiac Imaging Nurse Navigator Gordy Clement, Cardiac Imaging Nurse Navigator [facility] Direct Office Dial: [phone]   For scheduling needs, including cancellations and rescheduling, please call [place], [phone].      Follow-Up: At [facility], you and your health needs are our priority.  As part of our continuing mission to provide you with exceptional heart care, we have created designated Provider Care Teams.  These  Care Teams include your primary Cardiologist (physician) and Advanced Practice Providers (APPs -  Physician Assistants and Nurse Practitioners) who all work together to provide you with the care you need, when you need it.  We recommend signing up for the patient portal called "MyChart".  Sign up information is provided on this After Visit Summary.  MyChart is used to connect with patients for Virtual Visits (Telemedicine).  Patients are able to view lab/test results, encounter notes, upcoming appointments, etc.  Non-urgent messages can be sent to your provider as well.   To learn more about what you can do with MyChart, go to [email or web address].    Your next appointment:   Follow up after Echo and CCTA for 07/25/21   The format for your next appointment:   In Person  Provider:   Kate Sable, MD   Other Instructions    Signed, Kate Sable, MD  07/19/2021 1:17 PM    [place]

## 2021-07-19 NOTE — Patient Instructions (Addendum)
Medication Instructions:   Your physician recommends that you continue on your current medications as directed. Please refer to the Current Medication list given to you today.   *If you need a refill on your cardiac medications before your next appointment, please call your pharmacy*   Lab Work:  BMP to be drawn today.  If you have labs (blood work) drawn today and your tests are completely normal, you will receive your results only by: MyChart Message (if you have MyChart) OR A paper copy in the mail If you have any lab test that is abnormal or we need to change your treatment, we will call you to review the results.   Testing/Procedures:  Your physician has requested that you have an echocardiogram. Echocardiography is a painless test that uses sound waves to create images of your heart. It provides your doctor with information about the size and shape of your heart and how well your heart's chambers and valves are working. This procedure takes approximately one hour. There are no restrictions for this procedure.  Your physician has requested that you have cardiac CT. Cardiac computed tomography (CT) is a painless test that uses an x-ray machine to take clear, detailed pictures of your heart.   Your cardiac CT will be scheduled at:  [facility] [address] [place], [place] [address] [phone]    Please arrive 15 mins early for check-in and test prep.    Please follow these instructions carefully (unless otherwise directed):     On the Night Before the Test: Be sure to Drink plenty of water. Do not consume any caffeinated/decaffeinated beverages or chocolate 12 hours prior to your test.   On the Day of the Test: Drink plenty of water until 1 hour prior to the test. Do not eat any food 4 hours prior to the test. You may take your regular medications prior to the test.  Take metoprolol (Lopressor) 100 MG two hours  prior to test. Take Ivabradine (Corlanor) 10 MG two hours prior to your test FEMALES- please wear underwire-free bra if available, avoid dresses & tight clothing        After the Test: Drink plenty of water. After receiving IV contrast, you may experience a mild flushed feeling. This is normal. On occasion, you may experience a mild rash up to 24 hours after the test. This is not dangerous. If this occurs, you can take Benadryl 25 mg and increase your fluid intake. If you experience trouble breathing, this can be serious. If it is severe call 911 IMMEDIATELY. If it is mild, please call our office. If you take any of these medications: Glipizide/Metformin, Avandament, Glucavance, please do not take 48 hours after completing test unless otherwise instructed.  Please allow 2-4 weeks for scheduling of routine cardiac CTs. Some insurance companies require a pre-authorization which may delay scheduling of this test.   For non-scheduling related questions, please contact the cardiac imaging nurse navigator should you have any questions/concerns: Rockwell Alexandria, Cardiac Imaging Nurse Navigator Larey Brick, Cardiac Imaging Nurse Navigator [facility] Direct Office Dial: [phone]   For scheduling needs, including cancellations and rescheduling, please call [place], [phone].      Follow-Up: At [facility], you and your health needs are our priority.  As part of our continuing mission to provide you with exceptional heart care, we have created designated Provider Care Teams.  These Care Teams include your primary Cardiologist (physician) and Advanced Practice  Providers (APPs -  Physician Assistants and Nurse Practitioners) who all work together to provide you with the care you need, when you need it.  We recommend signing up for the patient portal called "MyChart".  Sign up information is provided on this After Visit Summary.  MyChart is used to connect with  patients for Virtual Visits (Telemedicine).  Patients are able to view lab/test results, encounter notes, upcoming appointments, etc.  Non-urgent messages can be sent to your provider as well.   To learn more about what you can do with MyChart, go to [email or web address].    Your next appointment:   Follow up after Echo and CCTA for 07/25/21   The format for your next appointment:   In Person  Provider:   Debbe Odea, MD   Other Instructions

## 2021-07-20 LAB — BASIC METABOLIC PANEL
BUN/Creatinine Ratio: 13 (ref 9–23)
BUN: 10 mg/dL (ref 6–20)
CO2: 21 mmol/L (ref 20–29)
Calcium: 9.1 mg/dL (ref 8.7–10.2)
Chloride: 102 mmol/L (ref 96–106)
Creatinine, Ser: 0.8 mg/dL (ref 0.57–1.00)
Glucose: 110 mg/dL — ABNORMAL HIGH (ref 70–99)
Potassium: 4.2 mmol/L (ref 3.5–5.2)
Sodium: 139 mmol/L (ref 134–144)
eGFR: 97 mL/min/{1.73_m2} (ref >59)

## 2021-07-24 ENCOUNTER — Telehealth (HOSPITAL_COMMUNITY): Payer: Self-pay | Admitting: Emergency Medicine

## 2021-07-24 NOTE — Telephone Encounter (Signed)
Reaching out to patient to offer assistance regarding upcoming cardiac imaging study; pt verbalizes understanding of appt date/time, parking situation and where to check in, pre-test NPO status and medications ordered, and verified current allergies; name and call back number provided for further questions should they arise Rockwell Alexandria RN Navigator Cardiac Imaging [facility] [phone] office [phone] cell  Denies iv issues 100mg  metop + 10mg  ivab (will come with designated driver)

## 2021-07-24 NOTE — Telephone Encounter (Signed)
Attempted to call patient regarding upcoming cardiac CT appointment. °Left message on voicemail with name and callback number °Symantha Steeber RN Navigator Cardiac Imaging °[facility] °[phone] Office °[phone] Cell ° °

## 2021-07-25 ENCOUNTER — Ambulatory Visit
Admission: RE | Admit: 2021-07-25 | Discharge: 2021-07-25 | Disposition: A | Discharge status: Home / Self Care | Payer: 59 | Source: Ambulatory Visit | Attending: Cardiology | Admitting: Cardiology

## 2021-07-25 ENCOUNTER — Other Ambulatory Visit: Payer: Self-pay

## 2021-07-25 DIAGNOSIS — R072 Precordial pain: Secondary | ICD-10-CM

## 2021-07-25 MED ORDER — IOHEXOL 350 MG/ML SOLN
100.0000 mL | Freq: Once | INTRAVENOUS | Status: AC | PRN
  Administered 2021-07-25: 100 mL via INTRAVENOUS

## 2021-07-25 MED ORDER — NITROGLYCERIN 0.4 MG SL SUBL
0.8000 mg | SUBLINGUAL_TABLET | Freq: Once | SUBLINGUAL | Status: AC
  Administered 2021-07-25: 0.8 mg via SUBLINGUAL

## 2021-07-25 MED ORDER — METOPROLOL TARTRATE 5 MG/5ML IV SOLN
10.0000 mg | Freq: Once | INTRAVENOUS | Status: AC
  Administered 2021-07-25: 10 mg via INTRAVENOUS

## 2021-07-25 MED ORDER — DILTIAZEM HCL 25 MG/5ML IV SOLN
10.0000 mg | Freq: Once | INTRAVENOUS | Status: AC
  Administered 2021-07-25: 10 mg via INTRAVENOUS

## 2021-07-25 NOTE — Progress Notes (Signed)
Patient tolerated procedure well. Ambulate w/o difficulty. Denies light headedness or being dizzy. Sitting in chair drinking water provided. Encouraged to drink extra water today and reasoning explained. Verbalized understanding. All questions answered. ABC intact. No further needs. Discharge from procedure area w/o issues.   °

## 2021-07-26 ENCOUNTER — Telehealth: Payer: Self-pay

## 2021-07-26 DIAGNOSIS — I728 Aneurysm of other specified arteries: Secondary | ICD-10-CM

## 2021-07-26 NOTE — Telephone Encounter (Signed)
[facility] radiology called to report and over read on patients CCTA. Detailed in the CCTA report. 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  Spoke with Dr Azucena Cecil and he is recommending that the patient follow up with Vascular Surgery. Referral entered.  Called patient and informed her of the above recommendations. Patient verbalized understanding and agreed with plan.

## 2021-07-31 DIAGNOSIS — M25471 Effusion, right ankle: Secondary | ICD-10-CM | POA: Insufficient documentation

## 2021-07-31 DIAGNOSIS — M12579 Traumatic arthropathy, unspecified ankle and foot: Secondary | ICD-10-CM | POA: Insufficient documentation

## 2021-08-01 ENCOUNTER — Other Ambulatory Visit: Payer: Self-pay

## 2021-08-01 ENCOUNTER — Ambulatory Visit (INDEPENDENT_AMBULATORY_CARE_PROVIDER_SITE_OTHER): Payer: 59 | Admitting: Vascular Surgery

## 2021-08-01 VITALS — BP 114/77 | HR 86 | Ht 63.0 in | Wt 221.0 lb

## 2021-08-01 DIAGNOSIS — R7303 Prediabetes: Secondary | ICD-10-CM

## 2021-08-01 DIAGNOSIS — J4521 Mild intermittent asthma with (acute) exacerbation: Secondary | ICD-10-CM

## 2021-08-01 DIAGNOSIS — I728 Aneurysm of other specified arteries: Secondary | ICD-10-CM

## 2021-08-01 DIAGNOSIS — K219 Gastro-esophageal reflux disease without esophagitis: Secondary | ICD-10-CM | POA: Diagnosis not present

## 2021-08-01 NOTE — Progress Notes (Signed)
MRN : [id number]  Sheri Malone is a 35 y.o. (1986-03-29) female who presents with chief complaint of aneurysm.  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 The aneurysm was found incidentally by CT scan done to evaluate coronary artery disease. Scan was done 07/25/2021.  Patient denies abdominal pain or unusual back pain, no other abdominal complaints.  No history of an acute onset of painful blue discoloration of the toes.     No family history of AAA.   Patient denies amaurosis fugax or TIA symptoms. There is no history of claudication or rest pain symptoms of the lower extremities.  The patient denies angina or shortness of breath.  CT scan shows a calcified splenic artery aneurysm that measures 14 mm.  Current Meds  Medication Sig   albuterol (PROVENTIL HFA;VENTOLIN HFA) 108 (90 Base) MCG/ACT inhaler Inhale 1 puff into the lungs every 6 (six) hours as needed for wheezing or shortness of breath.   azelastine (ASTELIN) 0.1 % nasal spray INHALE 1 2 PUFFS IN EACH NOSTRIL TWICE A DAY   azelastine (OPTIVAR) 0.05 % ophthalmic solution PUT 1 DROP INTO AFFECTED EYE 2 TIMES A DAY   EPINEPHrine 0.3 mg/0.3 mL IJ SOAJ injection epinephrine 0.3 mg/0.3 mL injection, auto-injector  INJECT 0.3 MLS (0.3 MG TOTAL) INTO THE MUSCLE ONCE FOR 1 DOSE.   fluticasone (FLONASE) 50 MCG/ACT nasal spray INHALE 1 2 SPRAYS IN EACH NOSTRIL ONCE A DAY   ipratropium-albuterol (DUONEB) 0.5-2.5 (3) MG/3ML SOLN Take 3 mLs by nebulization every 6 (six) hours as needed (for wheeze, coughing spells, chest tightness, SOB).   levocetirizine (XYZAL) 5 MG tablet Take 5 mg by mouth every evening.   meloxicam (MOBIC) 7.5 MG tablet Take 7.5 mg by mouth 2 (two) times daily.   montelukast (SINGULAIR) 10 MG tablet Take 10 mg by mouth every evening.   norethindrone-ethinyl estradiol (JUNEL 1/20) 1-20 MG-MCG tablet Take 1 tablet by mouth daily.    pantoprazole (PROTONIX) 40 MG tablet Take 1 tablet (40 mg total) by mouth 2 (two) times daily for 30 days, THEN 1 tablet (40 mg total) daily.   Respiratory Therapy Supplies (NEBULIZER/TUBING/MOUTHPIECE) KIT Disp one nebulizer machine, tubing set and mouthpiece kit   Rimegepant Sulfate (NURTEC) 75 MG TBDP 1 tab 1 day prior to start of menses, daily during menses, and 1 tab after completion of menses   Spacer/Aero-Holding Chambers (OPTICHAMBER DIAMOND) MISC    SYMBICORT 160-4.5 MCG/ACT inhaler INHALE 2 PUFFS INTO THE LUNGS IN THE MORNING AND AT BEDTIME.    Past Medical History:  Diagnosis Date   Abnormal Pap smear    Asthma    GERD (gastroesophageal reflux disease)    Gestational HTN    Headache(784.0)    History of ectopic pregnancy    History of gestational diabetes    Low hemoglobin    on ironspan   Rhinitis    Vaginal Pap smear, abnormal     Past Surgical History:  Procedure Laterality Date   CESAREAN SECTION N/A 05/20/2013   Procedure: CESAREAN SECTION;  Surgeon: Princess Bruins, MD;  Location: [facility];  Service: Obstetrics;  Laterality: N/A;  primary   CESAREAN SECTION N/A 02/04/2018   Procedure: Repeat CESAREAN SECTION;  Surgeon: Aloha Gell, MD;  Location: [facility];  Service: Obstetrics;  Laterality: N/A;  EDD: 02/16/18   CHOLECYSTECTOMY N/A 09/28/2018   Procedure: LAPAROSCOPIC CHOLECYSTECTOMY;  Surgeon: Coralie Keens, MD;  Location: [facility]  OR;  Service: General;  Laterality: N/A;   DILATION AND CURETTAGE OF UTERUS  2009   SALPINGOOPHORECTOMY  2009   left   UNILATERAL SALPINGECTOMY Right 02/04/2018   Procedure: UNILATERAL SALPINGECTOMY;  Surgeon: Aloha Gell, MD;  Location: [facility];  Service: Obstetrics;  Laterality: Right;   WISDOM TOOTH EXTRACTION      Social History Social History   Tobacco Use   Smoking status: Never   Smokeless tobacco: Never  Vaping Use   Vaping Use: Never used  Substance Use Topics   Alcohol use: Yes    Comment:  occassionally/once a month   Drug use: No    Family History Family History  Problem Relation Age of Onset   Migraines Mother    Hypertension Mother    Breast cancer Mother    Diabetes Father    Hypertension Father    Multiple sclerosis Sister    Cancer Maternal Grandfather        prostate   Heart disease Paternal Grandfather    Stomach cancer Neg Hx    Rectal cancer Neg Hx    Esophageal cancer Neg Hx    Colon cancer Neg Hx     Allergies  Allergen Reactions   Dust Mite Mixed Allergen Ext [Mite (D. Farinae)] Anaphylaxis   Other Anaphylaxis and Other (See Comments)    DUST ROACH     REVIEW OF SYSTEMS (Negative unless checked)  Constitutional: [] Weight loss  [] Fever  [] Chills Cardiac: [] Chest pain   [] Chest pressure   [] Palpitations   [] Shortness of breath when laying flat   [] Shortness of breath with exertion. Vascular:  [] Pain in legs with walking   [] Pain in legs at rest  [] History of DVT   [] Phlebitis   [] Swelling in legs   [] Varicose veins   [] Non-healing ulcers Pulmonary:   [] Uses home oxygen   [] Productive cough   [] Hemoptysis   [] Wheeze  [] COPD   [x] Asthma Neurologic:  [] Dizziness   [] Seizures   [] History of stroke   [] History of TIA  [] Aphasia   [] Vissual changes   [] Weakness or numbness in arm   [] Weakness or numbness in leg Musculoskeletal:   [] Joint swelling   [] Joint pain   [] Low back pain Hematologic:  [] Easy bruising  [] Easy bleeding   [] Hypercoagulable state   [] Anemic Gastrointestinal:  [] Diarrhea   [] Vomiting  [x] Gastroesophageal reflux/heartburn   [] Difficulty swallowing. Genitourinary:  [] Chronic kidney disease   [] Difficult urination  [] Frequent urination   [] Blood in urine Skin:  [] Rashes   [] Ulcers  Psychological:  [] History of anxiety   []  History of major depression.  Physical Examination  Vitals:   08/01/21 0856  BP: 114/77  Pulse: 86  Weight: 221 lb (100.2 kg)  Height: 5' 3"  (1.6 m)   Body mass index is 39.15 kg/m. Gen: WD/WN, NAD Head:  [place]/AT, No temporalis wasting.  Ear/Nose/Throat: Hearing grossly intact, nares w/o erythema or drainage Eyes: PER, EOMI, sclera nonicteric.  Neck: Supple, no masses.  No bruit or JVD.  Pulmonary:  Good air movement, no audible wheezing, no use of accessory muscles.  Cardiac: RRR, normal S1, S2, no Murmurs. Vascular:   no abdominal bruits or flank bruits Vessel Right Left  Radial Palpable Palpable  Gastrointestinal: soft, non-distended. No guarding/no peritoneal signs.  Musculoskeletal: M/S 5/5 throughout.  No visible deformity.  Neurologic: CN 2-12 intact. Pain and light touch intact in extremities.  Symmetrical.  Speech is fluent. Motor exam as listed above. Psychiatric: Judgment intact, Mood & affect appropriate for  pt's clinical situation. Dermatologic: No rashes or ulcers noted.  No changes consistent with cellulitis.   CBC Lab Results  Component Value Date   WBC 7.7 11/15/2020   HGB 12.8 11/15/2020   HCT 39.3 11/15/2020   MCV 80.0 11/15/2020   PLT 366 11/15/2020    BMET    Component Value Date/Time   NA 139 07/19/2021 0951   K 4.2 07/19/2021 0951   CL 102 07/19/2021 0951   CO2 21 07/19/2021 0951   GLUCOSE 110 (H) 07/19/2021 0951   GLUCOSE 95 11/15/2020 0922   BUN 10 07/19/2021 0951   CREATININE 0.80 07/19/2021 0951   CREATININE 0.74 11/15/2020 0922   CALCIUM 9.1 07/19/2021 0951   GFRNONAA 104 11/15/2020 0922   GFRAA 121 11/15/2020 0922   Estimated Creatinine Clearance: 108.7 mL/min (by C-G formula based on SCr of 0.8 mg/dL).  COAG No results found for: INR, PROTIME  Radiology CT CORONARY MORPH W/CTA COR W/SCORE W/CA W/CM &/OR WO/CM  Addendum Date: 07/25/2021   ADDENDUM REPORT: 07/25/2021 20:16 EXAM: OVER-READ INTERPRETATION  CT CHEST The following report is an over-read performed by radiologist Dr. Minerva Fester [facility] on 07/25/2021. This over-read does not include interpretation of cardiac or coronary anatomy or pathology. The coronary calcium  and coronary angiography interpretation by the cardiologist is attached. COMPARISON:  None FINDINGS: Cardiovascular: See dedicated report for cardiovascular details. Mediastinum/Nodes: No adenopathy in the chest, imaging is limited to cardiac structures and mid chest only. Lungs/Pleura: Minimal basilar atelectasis. No effusion. No consolidative process. Visualized airways are patent. Upper Abdomen: Incidental imaging of upper abdominal contents without acute process though there may be a splenic artery aneurysm as large as 14 x 10 mm, perhaps even larger in a woman of child-bearing age (image 74/18) Musculoskeletal: Unremarkable. IMPRESSION: Possible splenic artery aneurysm at least 14 x 10 mm, likely larger in a woman of child-bearing age. Suggest CT of the abdomen with contrast for further evaluation. Comparison with prior imaging if available could also be performed. No current imaging is noted of the abdomen and pelvis within the system for review. Otherwise, no significant extracardiac findings. These results will be called to the ordering clinician or representative by the Radiologist Assistant, and communication documented in the PACS or [employer]. Electronically Signed   By: Zetta Bills M.D.   On: 07/25/2021 20:16   Result Date: 07/25/2021 CLINICAL DATA:  Chest pain EXAM: Cardiac/Coronary  CTA TECHNIQUE: The patient was scanned on a Siemens Somatom go.Top scanner. : A retrospective scan was triggered in the descending thoracic aorta. Axial non-contrast 3 mm slices were carried out through the heart. The data set was analyzed on a dedicated work station and scored using the [place]. Gantry rotation speed was 330 msecs and collimation was .6 mm. 152m of metoprolol and 0.8 mg of sl NTG was given. The 3D data set was reconstructed in 5% intervals of the 60-95 % of the R-R cycle. Diastolic phases were analyzed on a dedicated work station using MPR, MIP and VRT modes. The patient received 100  cc of contrast. FINDINGS: Aorta:  Normal size.  No calcifications.  No dissection. Aortic Valve:  Trileaflet.  No calcifications. Coronary Arteries:  Normal coronary origin.  Right dominance. RCA is a dominant artery that gives rise to PDA and PLA. There is no plaque. Left main is a large artery that gives rise to LAD and LCX arteries. LAD has no plaque. LCX is a non-dominant artery that gives rise to one OM1  branch. There is no plaque. Other findings: Normal pulmonary vein drainage into the left atrium. Normal left atrial appendage without a thrombus. Normal size of the pulmonary artery. IMPRESSION: 1. Normal coronary calcium score of 0. Patient is low risk for coronary events. 2. Normal coronary origin with right dominance. 3. No evidence of CAD. 4. CAD-RADS 0. Consider non-atherosclerotic causes of chest pain. Electronically Signed: By: Kate Sable M.D. On: 07/25/2021 15:29     Assessment/Plan 1. Splenic artery aneurysm (HCC) No surgery or intervention at this time.  The patient has an asymptomatic splenic artery aneurysm that is less than 2.5 cm in maximal diameter.  I have discussed the natural history of splenic artery aneurysms and the small risk of rupture for aneurysm less than 2.5 cm in size.  However, as these small aneurysms tend to enlarge over time, continued surveillance with ultrasound or CT scan is mandatory.   I have also discussed optimizing medical management with hypertension and lipid control and the importance of abstinence from tobacco.  The patient is also encouraged to exercise a minimum of 30 minutes 4 times a week.   Should the patient develop new onset abdominal or back pain they are instructed to seek medical attention immediately and to alert the physician providing care that they have an aneurysm.   The patient voices their understanding.  The patient will return in 12 months with an mesenteric  duplex.   - VAS [name] MESENTERIC; Future  2. Mild intermittent  asthma with acute exacerbation Continue pulmonary medications and aerosols as already ordered, these medications have been reviewed and there are no changes at this time.    3. Gastro-esophageal reflux disease without esophagitis Continue PPI as already ordered, this medication has been reviewed and there are no changes at this time.  Avoidence of caffeine and alcohol  Moderate elevation of the head of the bed    4. Prediabetes Continue hypoglycemic medications if needed,  these medications have been reviewed and there are no changes at this time.  Hgb A1C to be monitored as already arranged by primary service     [name], MD  08/01/2021 9:34 AM

## 2021-08-02 ENCOUNTER — Other Ambulatory Visit: Payer: 59

## 2021-08-06 ENCOUNTER — Encounter (INDEPENDENT_AMBULATORY_CARE_PROVIDER_SITE_OTHER): Payer: Self-pay | Admitting: Vascular Surgery

## 2021-08-06 DIAGNOSIS — I728 Aneurysm of other specified arteries: Secondary | ICD-10-CM | POA: Insufficient documentation

## 2021-08-08 ENCOUNTER — Other Ambulatory Visit: Payer: Self-pay

## 2021-08-08 ENCOUNTER — Ambulatory Visit (INDEPENDENT_AMBULATORY_CARE_PROVIDER_SITE_OTHER): Payer: 59

## 2021-08-08 DIAGNOSIS — R072 Precordial pain: Secondary | ICD-10-CM

## 2021-08-08 LAB — ECHOCARDIOGRAM COMPLETE
AR max vel: 2.91 cm2
AV Area VTI: 3.38 cm2
AV Area mean vel: 3.11 cm2
AV Mean grad: 5 mmHg
AV Peak grad: 8.9 mmHg
Ao pk vel: 1.49 m/s
Area-P 1/2: 3.46 cm2
Calc EF: 65.7 %
S' Lateral: 2.4 cm
Single Plane A2C EF: 66 %
Single Plane A4C EF: 63.7 %

## 2021-08-09 ENCOUNTER — Encounter: Payer: Self-pay | Admitting: Cardiology

## 2021-08-09 ENCOUNTER — Ambulatory Visit: Payer: 59 | Admitting: Cardiology

## 2021-08-09 VITALS — BP 130/80 | HR 100 | Ht 63.0 in | Wt 223.0 lb

## 2021-08-09 DIAGNOSIS — Z6839 Body mass index (BMI) 39.0-39.9, adult: Secondary | ICD-10-CM

## 2021-08-09 DIAGNOSIS — R072 Precordial pain: Secondary | ICD-10-CM

## 2021-08-09 NOTE — Progress Notes (Signed)
Cardiology Office Note:    Date:  08/09/2021   ID:  [name], DOB 1986-10-01, MRN [id number]  PCP:  [name], PA-C   [facility] Providers Cardiologist:  None     Referring MD: [name], PA-C   Chief Complaint  Patient presents with   Follow-up    F/U after cardiac testing      History of Present Illness:    Sheri Malone is a 35 y.o. female with a hx of asthma, GERD who presents for follow-up.  She was last seen due to left-sided chest pain ongoing for about 2 months.  Symptoms not associated with exertion and different from prior reflux-like chest pain.  Echocardiogram and coronary CTA were obtained to evaluate cardiac function and presence of CAD.  She presents for testing results today.  Chest discomfort is about the same, being much better this week.  No new concerns at this time.  Takes Protonix as prescribed for reflux.  Past Medical History:  Diagnosis Date   Abnormal Pap smear    Asthma    GERD (gastroesophageal reflux disease)    Gestational HTN    Headache(784.0)    History of ectopic pregnancy    History of gestational diabetes    Low hemoglobin    on ironspan   Rhinitis    Vaginal Pap smear, abnormal     Past Surgical History:  Procedure Laterality Date   CESAREAN SECTION N/A 05/20/2013   Procedure: CESAREAN SECTION;  Surgeon: Princess Bruins, MD;  Location: [facility];  Service: Obstetrics;  Laterality: N/A;  primary   CESAREAN SECTION N/A 02/04/2018   Procedure: Repeat CESAREAN SECTION;  Surgeon: Aloha Gell, MD;  Location: [place];  Service: Obstetrics;  Laterality: N/A;  EDD: 02/16/18   CHOLECYSTECTOMY N/A 09/28/2018   Procedure: LAPAROSCOPIC CHOLECYSTECTOMY;  Surgeon: Coralie Keens, MD;  Location: [facility];  Service: General;  Laterality: N/A;   [place] OF UTERUS  2009   SALPINGOOPHORECTOMY  2009   left   UNILATERAL SALPINGECTOMY Right 02/04/2018   Procedure: UNILATERAL SALPINGECTOMY;  Surgeon: Aloha Gell, MD;  Location: [place];  Service: Obstetrics;  Laterality: Right;   WISDOM TOOTH EXTRACTION      Current Medications: Current Meds  Medication Sig   albuterol (PROVENTIL HFA;VENTOLIN HFA) 108 (90 Base) MCG/ACT inhaler Inhale 1 puff into the lungs every 6 (six) hours as needed for wheezing or shortness of breath.   azelastine (ASTELIN) 0.1 % nasal spray INHALE 1 2 PUFFS IN EACH NOSTRIL TWICE A DAY   azelastine (OPTIVAR) 0.05 % ophthalmic solution PUT 1 DROP INTO AFFECTED EYE 2 TIMES A DAY   EPINEPHrine 0.3 mg/0.3 mL IJ SOAJ injection epinephrine 0.3 mg/0.3 mL injection, auto-injector  INJECT 0.3 MLS (0.3 MG TOTAL) INTO THE MUSCLE ONCE FOR 1 DOSE.   fluticasone (FLONASE) 50 MCG/ACT nasal spray INHALE 1 2 SPRAYS IN EACH NOSTRIL ONCE A DAY   ipratropium-albuterol (DUONEB) 0.5-2.5 (3) MG/3ML SOLN Take 3 mLs by nebulization every 6 (six) hours as needed (for wheeze, coughing spells, chest tightness, SOB).   levocetirizine (XYZAL) 5 MG tablet Take 5 mg by mouth every evening.   meloxicam (MOBIC) 7.5 MG tablet Take 7.5 mg by mouth 2 (two) times daily.   metoprolol tartrate (LOPRESSOR) 100 MG tablet Take 1 tablet (100 mg total) by mouth once for 1 dose. Take 2 hours prior to your CT scan.   montelukast (SINGULAIR) 10 MG tablet Take 10 mg by mouth every evening.  norethindrone-ethinyl estradiol (JUNEL 1/20) 1-20 MG-MCG tablet Take 1 tablet by mouth daily.   pantoprazole (PROTONIX) 40 MG tablet Take 1 tablet (40 mg total) by mouth 2 (two) times daily for 30 days, THEN 1 tablet (40 mg total) daily.   Respiratory Therapy Supplies (NEBULIZER/TUBING/MOUTHPIECE) KIT Disp one nebulizer machine, tubing set and mouthpiece kit   Rimegepant Sulfate (NURTEC) 75 MG TBDP 1 tab 1 day prior to start of menses, daily during menses, and 1 tab after completion of menses   Spacer/Aero-Holding Chambers (OPTICHAMBER DIAMOND) MISC    SYMBICORT 160-4.5 MCG/ACT inhaler INHALE 2 PUFFS INTO THE LUNGS IN THE  MORNING AND AT BEDTIME.     Allergies:   Dust mite mixed allergen ext [mite (d. farinae)] and Other   Social History   Socioeconomic History   Marital status: Single    Spouse name: Not on file   Number of children: Not on file   Years of education: Not on file   Highest education level: Not on file  Occupational History   Not on file  Tobacco Use   Smoking status: Never   Smokeless tobacco: Never  Vaping Use   Vaping Use: Never used  Substance and Sexual Activity   Alcohol use: Yes    Comment: occassionally/once a month   Drug use: No   Sexual activity: Yes    Partners: Male  Other Topics Concern   Not on file  Social History Narrative   ** Merged History Encounter ** Lives at home with husband and daughter and son.   Right handed   Drinks 2 cups of caffeine daily   Social Determinants of Health   Financial Resource Strain: Not on file  Food Insecurity: Not on file  Transportation Needs: Not on file  Physical Activity: Not on file  Stress: Not on file  Social Connections: Not on file     Family History: The patient's family history includes Breast cancer in her mother; Cancer in her maternal grandfather; Diabetes in her father; Heart disease in her paternal grandfather; Hypertension in her father and mother; Migraines in her mother; Multiple sclerosis in her sister. There is no history of Stomach cancer, Rectal cancer, Esophageal cancer, or Colon cancer.  ROS:   Please see the history of present illness.     All other systems reviewed and are negative.  EKGs/Labs/Other Studies Reviewed:    The following studies were reviewed today:   EKG:  EKG not ordered today.   Recent Labs: 11/15/2020: ALT 15; Hemoglobin 12.8; Platelets 366 07/19/2021: BUN 10; Creatinine, Ser 0.80; Potassium 4.2; Sodium 139  Recent Lipid Panel    Component Value Date/Time   CHOL 138 01/27/2014 0000   TRIG 188 (A) 01/27/2014 0000   HDL 42 01/27/2014 0000   LDLCALC 58 01/27/2014 0000      Risk Assessment/Calculations:          Physical Exam:    VS:  BP 130/80 (BP Location: Left Arm, Patient Position: Sitting, Cuff Size: Large)   Pulse 100   Ht 5' 3"  (1.6 m)   Wt 223 lb (101.2 kg)   LMP 07/21/2021 (Exact Date)   SpO2 98%   BMI 39.50 kg/m     Wt Readings from Last 3 Encounters:  08/09/21 223 lb (101.2 kg)  08/01/21 221 lb (100.2 kg)  07/19/21 221 lb (100.2 kg)     GEN:  Well nourished, well developed in no acute distress HEENT: Normal NECK: No JVD; No carotid bruits LYMPHATICS: No lymphadenopathy  CARDIAC: RRR, no murmurs, rubs, gallops RESPIRATORY:  Clear to auscultation without rales, wheezing or rhonchi  ABDOMEN: Soft, non-tender, non-distended MUSCULOSKELETAL:  No edema; No deformity  SKIN: Warm and dry NEUROLOGIC:  Alert and oriented x 3 PSYCHIATRIC:  Normal affect   ASSESSMENT:    1. Precordial pain   2. BMI 39.0-39.9,adult     PLAN:    In order of problems listed above:  Chest pain, echocardiogram with normal systolic function, no luxation.  No wall motion abnormalities.  Coronary CTA with no evidence for CAD, calcium score of 0.  Patient made aware of results, reassured.  Impaired relaxation likely secondary to obesity.  Consider increasing dose of Protonix to manage reflux symptoms as per PCP. Obesity, low-calorie diet, weight loss recommended.  Follow-up as needed.     Medication Adjustments/Labs and Tests Ordered: Current medicines are reviewed at length with the patient today.  Concerns regarding medicines are outlined above.  No orders of the defined types were placed in this encounter.   No orders of the defined types were placed in this encounter.    Patient Instructions  Medication Instructions:  Your physician recommends that you continue on your current medications as directed. Please refer to the Current Medication list given to you today.  *If you need a refill on your cardiac medications before your next  appointment, please call your pharmacy*   Lab Work: None ordered If you have labs (blood work) drawn today and your tests are completely normal, you will receive your results only by: [place] (if you have MyChart) OR A paper copy in the mail If you have any lab test that is abnormal or we need to change your treatment, we will call you to review the results.   Testing/Procedures: None ordered   Follow-Up: At [facility], you and your health needs are our priority.  As part of our continuing mission to provide you with exceptional heart care, we have created designated Provider Care Teams.  These Care Teams include your primary Cardiologist (physician) and Advanced Practice Providers (APPs -  Physician Assistants and Nurse Practitioners) who all work together to provide you with the care you need, when you need it.  We recommend signing up for the patient portal called "MyChart".  Sign up information is provided on this After Visit Summary.  MyChart is used to connect with patients for Virtual Visits (Telemedicine).  Patients are able to view lab/test results, encounter notes, upcoming appointments, etc.  Non-urgent messages can be sent to your provider as well.   To learn more about what you can do with MyChart, go to [email or web address].    Your next appointment:   As needed  The format for your next appointment:   In Person  Provider:   You may see Kathlyn Sacramento, MD or one of the following Advanced Practice Providers on your designated Care Team:   Murray Hodgkins, NP Christell Faith, PA-C Marrianne Mood, PA-C Cadence [place], [place]   Other Instructions N/A   Signed, Kate Sable, MD  08/09/2021 10:48 AM    [place]

## 2021-08-09 NOTE — Patient Instructions (Signed)
Medication Instructions:  Your physician recommends that you continue on your current medications as directed. Please refer to the Current Medication list given to you today.  *If you need a refill on your cardiac medications before your next appointment, please call your pharmacy*   Lab Work: None ordered If you have labs (blood work) drawn today and your tests are completely normal, you will receive your results only by: MyChart Message (if you have MyChart) OR A paper copy in the mail If you have any lab test that is abnormal or we need to change your treatment, we will call you to review the results.   Testing/Procedures: None ordered   Follow-Up: At [facility], you and your health needs are our priority.  As part of our continuing mission to provide you with exceptional heart care, we have created designated Provider Care Teams.  These Care Teams include your primary Cardiologist (physician) and Advanced Practice Providers (APPs -  Physician Assistants and Nurse Practitioners) who all work together to provide you with the care you need, when you need it.  We recommend signing up for the patient portal called "MyChart".  Sign up information is provided on this After Visit Summary.  MyChart is used to connect with patients for Virtual Visits (Telemedicine).  Patients are able to view lab/test results, encounter notes, upcoming appointments, etc.  Non-urgent messages can be sent to your provider as well.   To learn more about what you can do with MyChart, go to [email or web address].    Your next appointment:   As needed  The format for your next appointment:   In Person  Provider:   You may see Muhammad Arida, MD or one of the following Advanced Practice Providers on your designated Care Team:   Christopher Berge, NP Ryan Dunn, PA-C Jacquelyn Visser, PA-C Cadence [place], PA-C   Other Instructions N/A  

## 2021-08-22 ENCOUNTER — Telehealth: Payer: Self-pay

## 2021-08-22 ENCOUNTER — Other Ambulatory Visit: Payer: Self-pay

## 2021-08-22 DIAGNOSIS — K449 Diaphragmatic hernia without obstruction or gangrene: Secondary | ICD-10-CM

## 2021-08-22 DIAGNOSIS — K219 Gastro-esophageal reflux disease without esophagitis: Secondary | ICD-10-CM

## 2021-08-22 NOTE — Telephone Encounter (Signed)
Secure e-mail sent to Debbie Mays about scheduled procedure. 

## 2021-08-22 NOTE — Telephone Encounter (Signed)
Patient has been scheduled for a cTIF with Dr. Andrey Campanile and Dr. Barron Alvine on Thursday, 11/14/21 at 7:30 am. This procedure will be completed in the [facility] OR. February schedule is not available at this time. Pt will need to be scheduled for a follow up with Dr. Barron Alvine on Thursday, 11/28/21. Post TIF diet and information has been sent to patient via my chart and a hard copy has been mailed. I spoke with patient and she is aware of all information. Pt verbalized understanding and had no concerns at the end of the call.

## 2021-09-10 ENCOUNTER — Telehealth: Payer: Self-pay

## 2021-09-10 NOTE — Telephone Encounter (Signed)
February schedule is now available. Pt needs to be scheduled for a follow up with Dr. Barron Alvine on 11/28/21 for a 2-week post TIF. Left message on patient's vm to return call to get scheduled.

## 2021-09-11 NOTE — Telephone Encounter (Signed)
Called and spoke with patient. Pt states that she would like to schedule a follow up prior to her procedure to discuss TIF further. Pt has been scheduled for a follow up with Dr. Barron Alvine on Wednesday, 10/23/21 at 9:40 am. Patient has also been scheduled for her 2-week post TIF follow up on Thursday, 11/28/21 at 9:40 am. Pt verbalized understanding and had no concerns at the end of the call.

## 2021-09-20 ENCOUNTER — Encounter: Payer: Self-pay | Admitting: Nurse Practitioner

## 2021-09-20 ENCOUNTER — Ambulatory Visit: Payer: 59 | Admitting: Nurse Practitioner

## 2021-09-20 ENCOUNTER — Encounter: Payer: Self-pay | Admitting: Family Medicine

## 2021-09-20 VITALS — BP 138/84 | HR 90 | Temp 98.2°F | Resp 16 | Ht 63.0 in | Wt 223.7 lb

## 2021-09-20 DIAGNOSIS — R42 Dizziness and giddiness: Secondary | ICD-10-CM

## 2021-09-20 DIAGNOSIS — Z862 Personal history of diseases of the blood and blood-forming organs and certain disorders involving the immune mechanism: Secondary | ICD-10-CM

## 2021-09-20 NOTE — Progress Notes (Signed)
BP 138/84   Pulse 90   Temp 98.2 F (36.8 C) (Oral)   Resp 16   Ht 5\' 3"  (1.6 m)   Wt 223 lb 11.2 oz (101.5 kg)   SpO2 99%   BMI 39.63 kg/m    Subjective:    Patient ID: , female    DOB: 24-Oct-1985, 35 y.o.   MRN: [id number]  HPI: Sheri Malone is a 35 y.o. female, here alone  Chief Complaint  Patient presents with   Dizziness    Pt states mainly happens during menstrual cycle has noticed it over a month. Pt states she has been diagnosed anemic before. Pt denies constant dizziness.   Dizziness: She says she noticed she had dizziness that comes and goes, during last two menstrual cycles.  She said it happened again this week with her menstrual cycle that finished on 09/16/21.  She says that her flow was medium to heavy the last two cycles.  She says that some months she will have a heavy cycle and some months a light cycle. She is currently on birth control.  She says that she does have a history of iron deficiency anemia.  She denies any dizziness with change of position or moving of head.  She denies any chest pain, shortness of breath, headache or confusion.  She has checked her blood pressure at home and it has been normal.  Her blood pressure here is 138/84.  Her heart rate was elevated when she arrived but went down after sitting for a bit.  Romberg test was negative and no nystagmus was noted. Cranial nerves were intact, equal grip strength.  She does have a history of iron deficiency anemia.  Will get labs and discuss treatment when we have results.   History of iron deficiency anemia:  She says she has been anemic before and had to take iron.  She says it has been awhile since she took iron.  Will get labs and discuss with her when we have results.   Relevant past medical, surgical, family and social history reviewed and updated as indicated. Interim medical history since our last visit reviewed. Allergies and medications reviewed and updated.  Review of  Systems  Constitutional: Negative for fever or weight change.  Respiratory: Negative for cough and shortness of breath.   Cardiovascular: Negative for chest pain or palpitations.  Gastrointestinal: Negative for abdominal pain, no bowel changes.  Musculoskeletal: Negative for gait problem or joint swelling.  Skin: Negative for rash.  Neurological: Positive for dizziness, negative for headache.  No other specific complaints in a complete review of systems (except as listed in HPI above).      Objective:    BP 138/84   Pulse 90   Temp 98.2 F (36.8 C) (Oral)   Resp 16   Ht 5\' 3"  (1.6 m)   Wt 223 lb 11.2 oz (101.5 kg)   SpO2 99%   BMI 39.63 kg/m   Wt Readings from Last 3 Encounters:  09/20/21 223 lb 11.2 oz (101.5 kg)  08/09/21 223 lb (101.2 kg)  08/01/21 221 lb (100.2 kg)    Physical Exam  Constitutional: Patient appears well-developed and well-nourished. Obese No distress.  HEENT: head atraumatic, normocephalic, pupils equal and reactive to light, no nystagmus, conjunctiva pale, oral mucosa pale, ears TMs clear, neck supple  Cardiovascular: Normal rate, regular rhythm and normal heart sounds.  No murmur heard. No BLE edema. Pulmonary/Chest: Effort normal and breath sounds normal. No respiratory  distress. Abdominal: Soft.  There is no tenderness. Neuro: Cranial nerves intact, romberg test negative Psychiatric: Patient has a normal mood and affect. behavior is normal. Judgment and thought content normal.   Results for orders placed or performed in visit on 08/08/21  ECHOCARDIOGRAM COMPLETE  Result Value Ref Range   AR max vel 2.91 cm2   AV Peak grad 8.9 mmHg   Ao pk vel 1.49 m/s   S' Lateral 2.40 cm   Area-P 1/2 3.46 cm2   AV Area VTI 3.38 cm2   AV Mean grad 5.0 mmHg   Single Plane A4C EF 63.7 %   Single Plane A2C EF 66.0 %   Calc EF 65.7 %   AV Area mean vel 3.11 cm2      Assessment & Plan:   1. Dizziness  -push fluids -will call with results  - CBC with  Differential/Platelet - COMPLETE METABOLIC PANEL WITH GFR  2. History of anemia  - CBC with Differential/Platelet - Iron, TIBC and Ferritin Panel   Follow up plan: Return if symptoms worsen or fail to improve.

## 2021-09-21 ENCOUNTER — Other Ambulatory Visit: Payer: Self-pay | Admitting: Nurse Practitioner

## 2021-09-21 DIAGNOSIS — R42 Dizziness and giddiness: Secondary | ICD-10-CM

## 2021-09-21 LAB — CBC WITH DIFFERENTIAL/PLATELET
Absolute Monocytes: 536 cells/uL (ref 200–950)
Basophils Absolute: 72 cells/uL (ref 0–200)
Basophils Relative: 0.7 %
Eosinophils Absolute: 82 cells/uL (ref 15–500)
Eosinophils Relative: 0.8 %
HCT: 38.7 % (ref 35.0–45.0)
Hemoglobin: 12.5 g/dL (ref 11.7–15.5)
Lymphs Abs: 2822 cells/uL (ref 850–3900)
MCH: 26.1 pg — ABNORMAL LOW (ref 27.0–33.0)
MCHC: 32.3 g/dL (ref 32.0–36.0)
MCV: 80.8 fL (ref 80.0–100.0)
MPV: 9.5 fL (ref 7.5–12.5)
Monocytes Relative: 5.2 %
Neutro Abs: 6788 cells/uL (ref 1500–7800)
Neutrophils Relative %: 65.9 %
Platelets: 356 10*3/uL (ref 140–400)
RBC: 4.79 10*6/uL (ref 3.80–5.10)
RDW: 12.5 % (ref 11.0–15.0)
Total Lymphocyte: 27.4 %
WBC: 10.3 10*3/uL (ref 3.8–10.8)

## 2021-09-21 LAB — COMPLETE METABOLIC PANEL WITH GFR
AG Ratio: 1.5 (calc) (ref 1.0–2.5)
ALT: 11 U/L (ref 6–29)
AST: 10 U/L (ref 10–30)
Albumin: 4.1 g/dL (ref 3.6–5.1)
Alkaline phosphatase (APISO): 27 U/L — ABNORMAL LOW (ref 31–125)
BUN: 11 mg/dL (ref 7–25)
CO2: 26 mmol/L (ref 20–32)
Calcium: 9.4 mg/dL (ref 8.6–10.2)
Chloride: 101 mmol/L (ref 98–110)
Creat: 0.81 mg/dL (ref 0.50–0.97)
Globulin: 2.7 g/dL (calc) (ref 1.9–3.7)
Glucose, Bld: 118 mg/dL — ABNORMAL HIGH (ref 65–99)
Potassium: 4.2 mmol/L (ref 3.5–5.3)
Sodium: 138 mmol/L (ref 135–146)
Total Bilirubin: 0.6 mg/dL (ref 0.2–1.2)
Total Protein: 6.8 g/dL (ref 6.1–8.1)
eGFR: 96 mL/min/{1.73_m2} (ref >=60)

## 2021-09-21 LAB — IRON,TIBC AND FERRITIN PANEL
%SAT: 27 % (calc) (ref 16–45)
Ferritin: 35 ng/mL (ref 16–154)
Iron: 106 ug/dL (ref 40–190)
TIBC: 396 mcg/dL (calc) (ref 250–450)

## 2021-09-21 MED ORDER — MECLIZINE HCL 25 MG PO TABS: 25.0000 mg | ORAL_TABLET | Freq: Three times a day (TID) | ORAL | 0 refills | Status: DC | PRN

## 2021-09-25 ENCOUNTER — Telehealth: Payer: Self-pay

## 2021-09-25 NOTE — Telephone Encounter (Signed)
Secure e-mail sent to Debi Mays at [facility] endo letting her know that we will no longer be doing a 7:30 am cTIF case.

## 2021-09-25 NOTE — Telephone Encounter (Signed)
-----   Message from [facility], DO sent at 09/25/2021  2:30 PM EST ----- Regarding: RE: rec cancelling procedure Completely agree with you. Shouldn't perform TIF with that BMI and agree with the options that you have outlined to her. Appreciate you letting me know.  Kaydance Bowie, Please see the below message and update [facility] that we will be canceling her 11/14/21 case. Thanks.   ----- Message ----- From: Gaynelle Adu, MD Sent: 09/25/2021   1:15 PM EST To: Shellia Cleverly, DO Subject: rec cancelling procedure                       Sheri Malone- pt called with concerns about TIF portion. Wanted to know also about weight loss surgery.    Regardless her BMI is now 38 based on a 12/2 office visit with Pcp - too high for hiatal hernia repair and I imagine TIF  I have told my team that she needs to be cancelled for 11/14/21  This is message I sent my team -  Diane, Please call pt - I would NOT recommend proceeding with hiatal hernia repair/TIF as scheduled. Her current BMI is too high- risk of recurrence is too high.  Options are weight loss to get BMI <35 and then combined lap HHR with TIF with Dr Barron Alvine;  or lap HHR with fundoplication by me; only other option at her current BMI would be a gastric bypass - but would have to verify insurance coverage to see if have bariatric benefits.   Rec checking her insurance for bari coverage - may have to contact pt to ensure her insurance is not changing 1/1. And then schedule her a 30 min appt with me.   Steward Drone - please cancel her procedure for 11/14/21.   eric

## 2021-10-21 ENCOUNTER — Encounter: Payer: Self-pay | Admitting: Nurse Practitioner

## 2021-10-21 ENCOUNTER — Ambulatory Visit (HOSPITAL_COMMUNITY): Admit: 2021-10-21 | Discharge status: Against Medical Advice | Payer: 59

## 2021-10-23 ENCOUNTER — Ambulatory Visit: Payer: 59 | Admitting: Gastroenterology

## 2021-10-24 ENCOUNTER — Other Ambulatory Visit (HOSPITAL_COMMUNITY): Payer: 59

## 2021-10-25 ENCOUNTER — Encounter: Payer: Self-pay | Admitting: Nurse Practitioner

## 2021-10-25 ENCOUNTER — Ambulatory Visit: Payer: 59 | Admitting: Nurse Practitioner

## 2021-10-25 ENCOUNTER — Other Ambulatory Visit: Payer: Self-pay

## 2021-10-25 VITALS — BP 118/72 | HR 100 | Temp 98.5°F | Resp 16 | Ht 63.0 in | Wt 221.3 lb

## 2021-10-25 DIAGNOSIS — R42 Dizziness and giddiness: Secondary | ICD-10-CM

## 2021-10-25 NOTE — Progress Notes (Signed)
BP 118/72    Pulse 100    Temp 98.5 F (36.9 C) (Oral)    Resp 16    Ht _0  (1.6 m)    Wt 221 lb 4.8 oz (100.4 kg)    SpO2 99%    BMI 39.20 kg/m    Subjective:    Patient ID: Sheri Malone, female    DOB: 1986/01/28, 36 y.o.   MRN: [id number]  HPI: Sheri Malone is a 36 y.o. female, here alone  Chief Complaint  Patient presents with   Dizziness   Dizziness:  She was seen by me on 09/20/21.  She came in because she had been experiencing dizziness around her menstrual cycle.  She also reported that she had a history of anemia.  So initially it was thought to be related to that.  However, labs were normal.  Gave a trial of meclizine to see if that would help. She says it did not help.  She went to urgent care on 10/21/21 for increased dizziness. She says that she was getting so dizzy it was hard to walk.  She denies any numbness or tingling in her extremities, just dizziness.  She denies any chest pain shortness of breath, headache or confusion.  Urgent care gave her valium and told her it was vertigo.  She says the valium did not help and she is still dizzy.  Her blood pressure has been in normal range, currently 118/72.  Cranial nerves intact.  Romberg test negative.  Discussed with patient that she should see Neurology.  She last saw neurology in 2019 for migraines.  Will send in a new referral.   Relevant past medical, surgical, family and social history reviewed and updated as indicated. Interim medical history since our last visit reviewed. Allergies and medications reviewed and updated.  Review of Systems  Constitutional: Negative for fever or weight change.  Respiratory: Negative for cough and shortness of breath.   Cardiovascular: Negative for chest pain or palpitations.  Gastrointestinal: Negative for abdominal pain, no bowel changes.  Musculoskeletal: Negative for gait problem or joint swelling.  Skin: Negative for rash.  Neurological: positive for dizziness, negative for  headache.  No other specific complaints in a complete review of systems (except as listed in HPI above).      Objective:    BP 118/72    Pulse 100    Temp 98.5 F (36.9 C) (Oral)    Resp 16    Ht _1  (1.6 m)    Wt 221 lb 4.8 oz (100.4 kg)    SpO2 99%    BMI 39.20 kg/m   Wt Readings from Last 3 Encounters:  10/25/21 221 lb 4.8 oz (100.4 kg)  09/20/21 223 lb 11.2 oz (101.5 kg)  08/09/21 223 lb (101.2 kg)    Physical Exam  Constitutional: Patient appears well-developed and well-nourished. Obese  No distress.  HEENT: head atraumatic, normocephalic, pupils equal and reactive to light, ears Tms clear, neck supple Cardiovascular: Normal rate, regular rhythm and normal heart sounds.  No murmur heard. No BLE edema. Pulmonary/Chest: Effort normal and breath sounds normal. No respiratory distress. Abdominal: Soft.  There is no tenderness. Psychiatric: Patient has a normal mood and affect. behavior is normal. Judgment and thought content normal.  Neurological : Cranial nerves intact, Romberg test negative  Results for orders placed or performed in visit on 09/20/21  CBC with Differential/Platelet  Result Value Ref Range   WBC 10.3 3.8 - 10.8 Thousand/uL  RBC 4.79 3.80 - 5.10 Million/uL   Hemoglobin 12.5 11.7 - 15.5 g/dL   HCT 38.7 35.0 - 45.0 %   MCV 80.8 80.0 - 100.0 fL   MCH 26.1 (L) 27.0 - 33.0 pg   MCHC 32.3 32.0 - 36.0 g/dL   RDW 12.5 11.0 - 15.0 %   Platelets 356 140 - 400 Thousand/uL   MPV 9.5 7.5 - 12.5 fL   Neutro Abs 6,788 1,500 - 7,800 cells/uL   Lymphs Abs 2,822 850 - 3,900 cells/uL   Absolute Monocytes 536 200 - 950 cells/uL   Eosinophils Absolute 82 15 - 500 cells/uL   Basophils Absolute 72 0 - 200 cells/uL   Neutrophils Relative % 65.9 %   Total Lymphocyte 27.4 %   Monocytes Relative 5.2 %   Eosinophils Relative 0.8 %   Basophils Relative 0.7 %  COMPLETE METABOLIC PANEL WITH GFR  Result Value Ref Range   Glucose, Bld 118 (H) 65 - 99 mg/dL   BUN 11 7 - 25 mg/dL    Creat 0.81 0.50 - 0.97 mg/dL   eGFR 96 > OR = 60 mL/min/1.50m   BUN/Creatinine Ratio NOT APPLICABLE 6 - 22 (calc)   Sodium 138 135 - 146 mmol/L   Potassium 4.2 3.5 - 5.3 mmol/L   Chloride 101 98 - 110 mmol/L   CO2 26 20 - 32 mmol/L   Calcium 9.4 8.6 - 10.2 mg/dL   Total Protein 6.8 6.1 - 8.1 g/dL   Albumin 4.1 3.6 - 5.1 g/dL   Globulin 2.7 1.9 - 3.7 g/dL (calc)   AG Ratio 1.5 1.0 - 2.5 (calc)   Total Bilirubin 0.6 0.2 - 1.2 mg/dL   Alkaline phosphatase (APISO) 27 (L) 31 - 125 U/L   AST 10 10 - 30 U/L   ALT 11 6 - 29 U/L  Iron, TIBC and Ferritin Panel  Result Value Ref Range   Iron 106 40 - 190 mcg/dL   TIBC 396 250 - 450 mcg/dL (calc)   %SAT 27 16 - 45 % (calc)   Ferritin 35 16 - 154 ng/mL      Assessment & Plan:   1. Dizziness  - Ambulatory referral to Neurology     Follow up plan: Return if symptoms worsen or fail to improve.

## 2021-10-28 ENCOUNTER — Encounter: Payer: Self-pay | Admitting: Nurse Practitioner

## 2021-10-28 ENCOUNTER — Telehealth (INDEPENDENT_AMBULATORY_CARE_PROVIDER_SITE_OTHER): Payer: 59 | Admitting: Nurse Practitioner

## 2021-10-28 ENCOUNTER — Other Ambulatory Visit: Payer: Self-pay

## 2021-10-28 DIAGNOSIS — J4520 Mild intermittent asthma, uncomplicated: Secondary | ICD-10-CM

## 2021-10-28 DIAGNOSIS — K219 Gastro-esophageal reflux disease without esophagitis: Secondary | ICD-10-CM

## 2021-10-28 DIAGNOSIS — R7303 Prediabetes: Secondary | ICD-10-CM | POA: Diagnosis not present

## 2021-10-28 DIAGNOSIS — I728 Aneurysm of other specified arteries: Secondary | ICD-10-CM

## 2021-10-28 DIAGNOSIS — Z6839 Body mass index (BMI) 39.0-39.9, adult: Secondary | ICD-10-CM

## 2021-10-28 NOTE — Progress Notes (Signed)
Name: CHE BELOW   MRN: [id number]    DOB: 1986-01-26   Date:10/28/2021       Progress Note  Subjective  Chief Complaint  Chief Complaint  Patient presents with   Consult    Discuss weight loss for surgery    I connected with  [name]  on 10/28/21 at 12:55 by a video enabled telemedicine application and verified that I am speaking with the correct person using two identifiers.  I discussed the limitations of evaluation and management by telemedicine and the availability of in person appointments. The patient expressed understanding and agreed to proceed with a virtual visit  Staff also discussed with the patient that there may be a patient responsible charge related to this service. 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  Her BMI was 39.2.  She is wanting to have weight loss surgery with [facility].  Her highest weight was 230 lbs in 2020.  She has tried multiple weight loss programs.  She says she tried [facility], and [employer] [facility] in 2021.  She says she also was prescribed Phentermine in 2020.  She says she would initially lose weight but then would regain it.   Prediabetes: Her last A1C was 5.4 on 11/15/20.  She is not currently on medication.  She is working on diet and exercise.  She is hoping to have weight loss surgery.  GERD: She is currently taking pantoprazole 40 mg daily.  She says her acid reflux is currently controlled on medication.   Asthma: She currently takes albuterol and duo neb as needed. She says she has not had to use them in awhile.   Splenic artery aneurysm: The aneurysm was found incidentally by CT scan on 07/25/21.  She saw vascular surgery on 08/01/2021.  No surgical intervention was recommended due to her aneurysm being less than 2.5 cm.  Dr. Delana Meyer is following, she will have mesenteric duplex later this year.   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   Iron deficiency anemia 11/15/2020   Slow transit constipation 11/15/2020   Microcytosis 11/15/2020   Arthralgia of right ankle 08/15/2020   Stiffness of right ankle joint 08/15/2020   Closed trimalleolar fracture of right ankle 05/21/2020   Gastro-esophageal reflux disease without esophagitis 04/13/2020   Morbid obesity ([place]) 01/20/2020   Vitamin D deficiency 01/20/2020   Prediabetes 01/20/2020   Insulin resistance 93/26/7124   Metabolic syndrome 58/06/9832   Allergic rhinitis 11/23/2016   Allergic conjunctivitis 01/17/2016   Mild intermittent asthma 01/17/2016   Dermatitis, eczematoid 06/21/2015   H/O cesarean section 06/21/2015   History of diabetes mellitus arising in pregnancy 06/21/2015   Headache, migraine 06/21/2015    Social History   Tobacco Use   Smoking status: Never   Smokeless tobacco: Never  Substance Use Topics   Alcohol use: Yes    Comment: occassionally/once a month     Current Outpatient Medications:    albuterol (PROVENTIL HFA;VENTOLIN HFA) 108 (90 Base) MCG/ACT inhaler, Inhale 1 puff into the lungs every 6 (six) hours as needed for wheezing or shortness of breath., Disp: , Rfl:    azelastine (ASTELIN) 0.1 % nasal spray, INHALE 1 2 PUFFS IN EACH NOSTRIL TWICE A DAY, Disp: , Rfl:  azelastine (OPTIVAR) 0.05 % ophthalmic solution, PUT 1 DROP INTO AFFECTED EYE 2 TIMES A DAY, Disp: , Rfl:    EPINEPHrine 0.3 mg/0.3 mL IJ SOAJ injection, epinephrine 0.3 mg/0.3 mL injection, auto-injector  INJECT 0.3 MLS (0.3 MG TOTAL) INTO THE MUSCLE ONCE FOR 1 DOSE., Disp: , Rfl:    fluticasone (FLONASE) 50 MCG/ACT nasal spray, Place into the nose., Disp: , Rfl:    ipratropium-albuterol (DUONEB) 0.5-2.5 (3) MG/3ML SOLN, Take 3 mLs by nebulization every 6 (six) hours as needed (for wheeze, coughing spells, chest  tightness, SOB)., Disp: 180 mL, Rfl: 1   levocetirizine (XYZAL) 5 MG tablet, Take by mouth., Disp: , Rfl:    meclizine (ANTIVERT) 25 MG tablet, Take 1 tablet (25 mg total) by mouth 3 (three) times daily as needed for dizziness., Disp: 30 tablet, Rfl: 0   meloxicam (MOBIC) 7.5 MG tablet, Take 7.5 mg by mouth 2 (two) times daily., Disp: , Rfl:    metoprolol tartrate (LOPRESSOR) 100 MG tablet, Take 1 tablet (100 mg total) by mouth once for 1 dose. Take 2 hours prior to your CT scan., Disp: 1 tablet, Rfl: 0   montelukast (SINGULAIR) 10 MG tablet, Take by mouth., Disp: , Rfl:    norethindrone-ethinyl estradiol (JUNEL 1/20) 1-20 MG-MCG tablet, Take 1 tablet by mouth daily., Disp: 84 tablet, Rfl: 3   ondansetron (ZOFRAN-ODT) 4 MG disintegrating tablet, Take by mouth., Disp: , Rfl:    Rimegepant Sulfate (NURTEC) 75 MG TBDP, 1 tab 1 day prior to start of menses, daily during menses, and 1 tab after completion of menses, Disp: 8 tablet, Rfl: 0   SYMBICORT 160-4.5 MCG/ACT inhaler, INHALE 2 PUFFS INTO THE LUNGS IN THE MORNING AND AT BEDTIME., Disp: 10.2 each, Rfl: 1   pantoprazole (PROTONIX) 40 MG tablet, Take 1 tablet (40 mg total) by mouth 2 (two) times daily for 30 days, THEN 1 tablet (40 mg total) daily., Disp: 120 tablet, Rfl: 0   Respiratory Therapy Supplies (NEBULIZER/TUBING/MOUTHPIECE) KIT, Disp one nebulizer machine, tubing set and mouthpiece kit (Patient not taking: Reported on 10/25/2021), Disp: 1 kit, Rfl: 0   Spacer/Aero-Holding Chambers (OPTICHAMBER DIAMOND) MISC, , Disp: , Rfl:   Allergies  Allergen Reactions   Dust Mite Mixed Allergen Ext [Mite (D. Farinae)] Anaphylaxis   Other Anaphylaxis and Other (See Comments)    DUST ROACH    I personally reviewed active problem list, medication list, allergies, notes from last encounter with the patient/caregiver today.  ROS  Constitutional: Negative for fever or weight change.  Respiratory: Negative for cough and shortness of breath.    Cardiovascular: Negative for chest pain or palpitations.  Gastrointestinal: Negative for abdominal pain, no bowel changes.  Musculoskeletal: Negative for gait problem or joint swelling.  Skin: Negative for rash.  Neurological: Negative for dizziness or headache.  No other specific complaints in a complete review of systems (except as listed in HPI above).   Objective  Virtual encounter, vitals not obtained.  There is no height or weight on file to calculate BMI.  Nursing Note and Vital Signs reviewed.  Physical Exam  Awake, alert and oriented, speaking in complete sentences  No results found for this or any previous visit (from the past 72 hour(s)).  Assessment & Plan  1. Class 2 severe obesity with serious comorbidity and body mass index (BMI) of 39.0 to 39.9 in adult, unspecified obesity type ([place]) -filled out paperwork for weight loss surgery -continue working on diet and exercise  2. Prediabetes -continue working on  diet and exercise  3. Gastroesophageal reflux disease, unspecified whether esophagitis present -continue current treatment plan  4. Mild intermittent asthma without complication -continue current treatment plan  5. Splenic artery aneurysm ([place]) -keep appointment with vascular for surveillance  -Red flags and when to present for emergency care or RTC including fever >101.63F, chest pain, shortness of breath, new/worsening/un-resolving symptoms,  reviewed with patient at time of visit. Follow up and care instructions discussed and provided in AVS. - I discussed the assessment and treatment plan with the patient. The patient was provided an opportunity to ask questions and all were answered. The patient agreed with the plan and demonstrated an understanding of the instructions.  I provided 20 minutes of non-face-to-face time during this encounter.  Bo Merino, FNP

## 2021-11-04 ENCOUNTER — Ambulatory Visit: Payer: Self-pay | Admitting: General Surgery

## 2021-11-08 ENCOUNTER — Encounter: Payer: Self-pay | Admitting: Nurse Practitioner

## 2021-11-12 ENCOUNTER — Telehealth: Payer: Self-pay | Admitting: Cardiology

## 2021-11-12 NOTE — Telephone Encounter (Signed)
° °  Pre-operative Risk Assessment    Patient Name: MEAGEN LIMONES  DOB: 1986/06/23 MRN: [id number]      Request for Surgical Clearance    Procedure:   bariatric surgery  Date of Surgery:  Clearance TBD                                 Surgeon:  Dr Gaynelle Adu Surgeon's Group or Practice Name:  [facility] Phone number:  [phone] Fax number:  [phone]   Type of Clearance Requested:   - Medical    Type of Anesthesia:  General    Additional requests/questions:    SignedNorman Herrlich   11/12/2021, 4:26 PM

## 2021-11-13 NOTE — Telephone Encounter (Signed)
° °  Name: Sheri Malone  DOB: February 03, 1986  MRN: [id number]   Primary Cardiologist: Debbe Odea, MD  Chart reviewed as part of pre-operative protocol coverage. Patient was contacted 11/13/2021 in reference to pre-operative risk assessment for pending surgery as outlined below.  Sheri Malone was last seen on 08/09/21 by Dr. Azucena Cecil.  Since that day, Sheri Malone has done well.  She had a reassuring coronary CTA and echocardiogram. She can complete 4.0 METS without angina.   Therefore, based on ACC/AHA guidelines, the patient would be at acceptable risk for the planned procedure without further cardiovascular testing.   The patient was advised that if she develops new symptoms prior to surgery to contact our office to arrange for a follow-up visit, and she verbalized understanding.  I will route this recommendation to the requesting party via Epic fax function and remove from pre-op pool. Please call with questions.  Roe Rutherford Manessa Buley, PA 11/13/2021, 11:36 AM

## 2021-11-14 ENCOUNTER — Ambulatory Visit: Admit: 2021-11-14 | Payer: 59 | Admitting: General Surgery

## 2021-11-14 SURGERY — REPAIR, HERNIA, HIATAL, LAPAROSCOPIC
Anesthesia: General

## 2021-11-15 DIAGNOSIS — H8112 Benign paroxysmal vertigo, left ear: Secondary | ICD-10-CM | POA: Insufficient documentation

## 2021-11-17 ENCOUNTER — Other Ambulatory Visit: Payer: Self-pay | Admitting: Family Medicine

## 2021-11-17 DIAGNOSIS — Z3041 Encounter for surveillance of contraceptive pills: Secondary | ICD-10-CM

## 2021-11-17 NOTE — Telephone Encounter (Signed)
Requested medication (s) are due for refill today - expired Rx  Requested medication (s) are on the active medication list -yes  Future visit scheduled -no  Last refill: 11/15/20 #[id number]  Notes to clinic: Request RF: expired Rx- my be due pap this year- sent for PCP review   Requested Prescriptions  Pending Prescriptions Disp Refills   JUNEL 1/20 1-20 MG-MCG tablet [Pharmacy Med Name: JUNEL 1 MG-20 MCG TABLET] 21 tablet 43    Sig: TAKE ONE TABLET BY MOUTH DAILY CONTINUOUSLY     OB/GYN:  Contraceptives Passed - 11/17/2021 12:52 AM      Passed - Last BP in normal range    BP Readings from Last 1 Encounters:  10/25/21 118/72          Passed - Valid encounter within last 12 months    Recent Outpatient Visits           2 weeks ago Class 2 severe obesity with serious comorbidity and body mass index (BMI) of 39.0 to 39.9 in adult, unspecified obesity type [facility])   [facility] Berniece Salines, FNP   3 weeks ago Dizziness   [facility] Berniece Salines, FNP   1 month ago Dizziness   [facility] Della Goo F, FNP   5 months ago Gastroesophageal reflux disease, unspecified whether esophagitis present   [facility] Danelle Berry, PA-C   6 months ago Chest pain, unspecified type   [facility] Danelle Berry, [place]                 Requested Prescriptions  Pending Prescriptions Disp Refills   JUNEL 1/20 1-20 MG-MCG tablet [Pharmacy Med Name: JUNEL 1 MG-20 MCG TABLET] 21 tablet 43    Sig: TAKE ONE TABLET BY MOUTH DAILY CONTINUOUSLY     OB/GYN:  Contraceptives Passed - 11/17/2021 12:52 AM      Passed - Last BP in normal range    BP Readings from Last 1 Encounters:  10/25/21 118/72          Passed - Valid encounter within last 12 months    Recent Outpatient Visits           2 weeks ago Class 2 severe obesity with serious comorbidity and body mass index (BMI) of 39.0 to 39.9 in  adult, unspecified obesity type [facility])   [facility] Berniece Salines, FNP   3 weeks ago Dizziness   [facility] Berniece Salines, FNP   1 month ago Dizziness   [facility] Della Goo F, FNP   5 months ago Gastroesophageal reflux disease, unspecified whether esophagitis present   [facility] Danelle Berry, PA-C   6 months ago Chest pain, unspecified type   [facility] Danelle Berry, [place]

## 2021-11-20 ENCOUNTER — Encounter: Payer: Self-pay | Admitting: Skilled Nursing Facility1

## 2021-11-20 ENCOUNTER — Encounter: Payer: 59 | Attending: General Surgery | Admitting: Skilled Nursing Facility1

## 2021-11-20 ENCOUNTER — Other Ambulatory Visit: Payer: Self-pay

## 2021-11-20 DIAGNOSIS — Z6841 Body Mass Index (BMI) 40.0 and over, adult: Secondary | ICD-10-CM | POA: Diagnosis not present

## 2021-11-20 DIAGNOSIS — Z713 Dietary counseling and surveillance: Secondary | ICD-10-CM | POA: Diagnosis not present

## 2021-11-20 DIAGNOSIS — K219 Gastro-esophageal reflux disease without esophagitis: Secondary | ICD-10-CM | POA: Diagnosis not present

## 2021-11-20 NOTE — Progress Notes (Signed)
Nutrition Assessment for Bariatric Surgery Medical Nutrition Therapy Appt Start Time: 3:51  End Time: 4:43  Patient was seen on 11/20/2021 for Pre-Operative Nutrition Assessment. Letter of approval faxed to [facility] bariatric surgery program coordinator on 11/20/2021.   Referral stated Supervised Weight Loss (SWL) visits needed: 3  Planned surgery: RYGB Pt expectation of surgery: to lose weight/cure reflux Pt expectation of dietitian: none identified     NUTRITION ASSESSMENT   Anthropometrics  Start weight at [facility]: 226 lbs (date: 11/20/2021)  Height: 62 in BMI: 41.34 kg/m2     Clinical  Medical hx: vertigo, nausea, GERD Medications: see list  Labs: alkaline phosphatase 33, LDL 103, A1C 6.0 Notable signs/symptoms: monthly migraines  Any previous deficiencies? No  Micronutrient Nutrition Focused Physical Exam: Hair: No issues observed Eyes: No issues observed Mouth: No issues observed Neck: No issues observed Nails: No issues observed Skin: No issues observed  Lifestyle & Dietary Hx  Pt states she has had multiple ankle surgeries so is not as active as she would like to be.  Pt states her main focus for the RYGB is to cure the acid reflux she has been suffering with since a teenager.   Pt states her husband had the sleeve gastrectomy.   24-Hr Dietary Recall First Meal: toast or biscuit Snack:  Second Meal: eaten out Snack:  Third Meal: eaten out Snack:  Beverages: soda, sweet tea, coffee   Estimated Energy Needs Calories:1500   NUTRITION DIAGNOSIS  Overweight/obesity ([place]-3.3) related to past poor dietary habits and physical inactivity as evidenced by patient w/ planned RYGB surgery following dietary guidelines for continued weight loss.    NUTRITION INTERVENTION  Nutrition counseling (C-1) and education (E-2) to facilitate bariatric surgery goals.  Educated pt on micronutrient deficiencies post surgery and strategies to mitigate that risk    Pre-Op Goals Reviewed with the Patient Track food and beverage intake (pen and paper, MyFitness Pal, Baritastic app, etc.) Make healthy food choices while monitoring portion sizes Consume 3 meals per day or try to eat every 3-5 hours Avoid concentrated sugars and fried foods Keep sugar & fat in the single digits per serving on food labels Practice CHEWING your food (aim for applesauce consistency) Practice not drinking 15 minutes before, during, and 30 minutes after each meal and snack Avoid all carbonated beverages (ex: soda, sparkling beverages)  Limit caffeinated beverages (ex: coffee, tea, energy drinks) Avoid all sugar-sweetened beverages (ex: regular soda, sports drinks): avoid soda Avoid alcohol  Aim for 64-100 ounces of FLUID daily (with at least half of fluid intake being plain water) increase water Aim for at least 60-80 grams of PROTEIN daily Look for a liquid protein source that contains ?15 g protein and ?5 g carbohydrate (ex: shakes, drinks, shots) Make a list of non-food related activities Physical activity is an important part of a healthy lifestyle so keep it moving! The goal is to reach 150 minutes of exercise per week, including cardiovascular and weight baring activity.  *Goals that are bolded indicate the pt would like to start working towards these  Handouts Provided Include  Bariatric Surgery handouts (Nutrition Visits, Pre-Op Goals, Protein Shakes, Vitamins & Minerals)  Learning Style & Readiness for Change Teaching method utilized: Visual & Auditory  Demonstrated degree of understanding via: Teach Back  Readiness Level: contemplative  Barriers to learning/adherence to lifestyle change: none identified   RD's Notes for Next Visit Assess pts adherence to chosen goals     MONITORING & EVALUATION Dietary intake, weekly  physical activity, body weight, and pre-op goals reached at next nutrition visit.    Next Steps  Patient is to follow up at [facility] for Pre-Op  Class >2 weeks before surgery for further nutrition education.  Return for next visit

## 2021-11-22 ENCOUNTER — Encounter: Payer: Self-pay | Admitting: Neurology

## 2021-11-22 ENCOUNTER — Ambulatory Visit: Payer: 59 | Admitting: Neurology

## 2021-11-22 VITALS — BP 117/85 | HR 91 | Ht 62.0 in | Wt 225.8 lb

## 2021-11-22 DIAGNOSIS — R42 Dizziness and giddiness: Secondary | ICD-10-CM

## 2021-11-22 DIAGNOSIS — R51 Headache with orthostatic component, not elsewhere classified: Secondary | ICD-10-CM

## 2021-11-22 DIAGNOSIS — H9192 Unspecified hearing loss, left ear: Secondary | ICD-10-CM

## 2021-11-22 DIAGNOSIS — R519 Headache, unspecified: Secondary | ICD-10-CM | POA: Diagnosis not present

## 2021-11-22 DIAGNOSIS — H8110 Benign paroxysmal vertigo, unspecified ear: Secondary | ICD-10-CM | POA: Diagnosis not present

## 2021-11-22 DIAGNOSIS — G43009 Migraine without aura, not intractable, without status migrainosus: Secondary | ICD-10-CM

## 2021-11-22 DIAGNOSIS — G8929 Other chronic pain: Secondary | ICD-10-CM

## 2021-11-22 DIAGNOSIS — H539 Unspecified visual disturbance: Secondary | ICD-10-CM

## 2021-11-22 MED ORDER — MECLIZINE HCL 25 MG PO TABS: 12.5000 mg | ORAL_TABLET | Freq: Three times a day (TID) | ORAL | 6 refills | Status: DC

## 2021-11-22 MED ORDER — SUMATRIPTAN SUCCINATE 100 MG PO TABS: 100.0000 mg | ORAL_TABLET | Freq: Once | ORAL | 12 refills | Status: DC | PRN

## 2021-11-22 MED ORDER — ONDANSETRON 4 MG PO TBDP: 4.0000 mg | ORAL_TABLET | Freq: Three times a day (TID) | ORAL | 3 refills | Status: DC | PRN

## 2021-11-22 NOTE — Progress Notes (Signed)
[facility]    [facility] provider:  Dr Lucia Gaskins Referring Provider: Danelle Berry, PA-C Primary Care Physician:  Danelle Berry, PA-C  CC: Dizziness  11/22/2021: In November she was sitting at her desk, she wa on Teams with her supervisor, ongoing for months, has been to pcp and urgent care, valium tried with side effects of sedation, all of a sudden she felt like the whole room is spinning, never experienced that before even with migraines. She hadn;t eaten anything all day. She went home and laid down and then it came back. She would notice it is close to her cycle, she had blood work and not anemic. Still doesn't go away. Episodes of room spinning and feeling like she is on a boat. Worse looking down or if she makes quick head motions it is worse, she went to urgent care, she was prescribed valium and she can;t work, urgent care went to ENT and she has a test next week. Along with the symptoms she is having hearing changes in the left ear, behid the left eye she gets pain, temple sensitivity, pulsating, pounding. Throbbing. At least once a week and nothing helps. Daily. Laying down makes it worse. Positional. No other focal neurologic deficits, associated symptoms, inciting events or modifiable factors.   Patient complains of symptoms per HPI as well as the following symptoms: dizziness, migraines . Pertinent negatives and positives per HPI. All others negative  Today, 01/18/2020, Ms. Majer is being seen today per patient request due to recent worsening of migraines.  She was previously seen in 08/2018 for follow-up regarding migraines and likely cervical strain.  At prior visits, she was pregnant and then breast-feeding therefore limited medication usage.  She would use Periactin for emergent relief with benefit.  Since prior visit, experiencing approximately 1 headache per month typically around menses.  She states on 01/04/2020, she was seen by the eye doctor and had eyes dilated with  resultant severe migraine over the next 5 days.  Endorses recent severe migraine same symptoms as typical monthly migraines but more severe in intensity and lasting longer in duration.  Associated with photophobia, phonophobia and nausea and would typically worsen throughout the day with complete debilitation in the evening.  Typically unilateral pounding/pulsating sensation.  She also endorses blurred vision at the onset of migraines but resolves after a few seconds hence the reason for seeing eye doctor.  Migraines typically alleviated by Periactin and Excedrin but recent episode medication not beneficial.  Worsened by position changes, loud noise and light.  Denies any medication changes or medical history changes.  She received dry needling for likely cervical strain with great improvement.  No further concerns at this time.     History: Provided for reference purposes only Interval history 08/24/2018 Jessica McCue(JM): Patient is being seen today due to concerns of continuous bilateral neck pain that radiates down into bilateral shoulders with intermittent numbness sensation down left arm.  She does state that she experiences a slight headache that radiates up into her occipital region but denies any further location.  She does endorse a muscle tightness greater on her left side located near her left shoulder and left side of neck.  Denies any type of traumatic event or injury that could have occurred.  She does not believe that it is related to sleeping habits or sleeping in a bad position.  She was seen at urgent care on 06/18/2018 where was recommended to take ibuprofen and Flexeril as it is felt to be  muscle related.  She is unable to take Flexeril during the day due to increased fatigue and would take 5 mg tablet at night but did not gain benefit from this.  She does take the ibuprofen on occasion which will take the edge off but it does not last long.  She is continuing to breast-feed at this time  therefore she tries to limit amount of medication use.  She has attempted to use heat with only minimal relief.  Per review of notes, patient has had this ongoing complaint and underwent imaging on 04/01/2017 which was negative. She does have a history of migraines for which she was taking Periactin during pregnancy but has since stopped due to continued breast-feeding and in the very beginning, there was one occurrence where she did make the baby drowsy.  She does endorse migraines approximately 1-2 times per month located behind her eye which worsens with fatigue or lack of calories and associated with photophobia, phonophobia, nausea and lightheadedness.  She has not noticed any worsening of these migraines since stopping the Periactin.  Interval history 12/15/2017 Dr. Lucia GaskinsAhern:  Her migraines have improved. She is in her second trimester. More related to sleep and fatigue due to pregnancy. She had a headache the other day. Discussed migraines can improve in pregnancy. The Periactin helps. Taking it when she has headaches helps if she takes it in the start of headache. Helps. Discussed migraines and menopause. Mother had migraines, improved after menopause.    Initial consult visit 09/15/2017 Dr. Lucia GaskinsAhern: Lisbeth RenshawShvawn P Christella HartiganJacobs is a 36 y.o. female here as a referral from Dr. Angelica Chessmanapia for headaches. Patient is approx [redacted] weeks pregnant.  She has had right arm and neck pain, had normal c-spine xrays, She has a history of migraines starting in adolescence. Birth control helped. She has used Fioricet in the past. Headaches are worsening. A few months ago she had a shoulder injury and PT has helped. These headaches are different, not eating triggers it, not sleeping well triggers headaches. Mother with migraines. It can start anywhere in the head, behind the eyes, can be unilateral, she has light/sound sensitivity, being in the dark helps, positional laying down makes it worse, movement makes it worse, sitting straight in a dark  place is the best. Worsening, weekly. Could last 2 days. She has headaches 20 days a month and over half are migrainous. She has associated nausea. She has vision changes with the migraines. No hearing changes, no pulsating in the ears.  Reviewed notes, labs and imaging from outside physicians, which showed: Personally reviewed images and agree with the following: There is no evidence of cervical spine fracture or prevertebral soft tissue swelling. Alignment is normal. No other significant bone abnormalities are identified. IMPRESSION: Negative cervical spine radiographs. CBC with anemia   Review of Systems: Patient complains of symptoms per HPI as well as the following symptoms: dizzy, migraines . Pertinent negatives and positives per HPI. All others negative    Social History   Socioeconomic History   Marital status: Single    Spouse name: Hayward   Number of children: 2   Years of education: Not on file   Highest education level: Master's degree (e.g., MA, MS, MEng, MEd, MSW, MBA)  Occupational History   Not on file  Tobacco Use   Smoking status: Never   Smokeless tobacco: Never  Vaping Use   Vaping Use: Never used  Substance and Sexual Activity   Alcohol use: Yes    Comment: occassionally/once a  month   Drug use: No   Sexual activity: Yes    Partners: Male  Other Topics Concern   Not on file  Social History Narrative   ** Merged History Encounter ** Lives at home with husband and daughter and son.   Right handed   Drinks 2 cups of caffeine daily   Social Determinants of Health   Financial Resource Strain: Not on file  Food Insecurity: Not on file  Transportation Needs: Not on file  Physical Activity: Not on file  Stress: Not on file  Social Connections: Not on file  Intimate Partner Violence: Not on file    Family History  Problem Relation Age of Onset   Migraines Mother    Hypertension Mother    Breast cancer Mother    Diabetes Father    Hypertension  Father    Multiple sclerosis Sister    Cancer Maternal Grandfather        prostate   Heart disease Paternal Grandfather    Stomach cancer Neg Hx    Rectal cancer Neg Hx    Esophageal cancer Neg Hx    Colon cancer Neg Hx     Past Medical History:  Diagnosis Date   Abnormal Pap smear    Asthma    GERD (gastroesophageal reflux disease)    Gestational HTN    Headache(784.0)    History of ectopic pregnancy    History of gestational diabetes    Low hemoglobin    on ironspan   Rhinitis    Vaginal Pap smear, abnormal     Past Surgical History:  Procedure Laterality Date   CESAREAN SECTION N/A 05/20/2013   Procedure: CESAREAN SECTION;  Surgeon: Genia DelMarie-Lyne Lavoie, MD;  Location: [facility];  Service: Obstetrics;  Laterality: N/A;  primary   CESAREAN SECTION N/A 02/04/2018   Procedure: Repeat CESAREAN SECTION;  Surgeon: Noland FordyceFogleman, Kelly, MD;  [facility];  Service: Obstetrics;  Laterality: N/A;  EDD: 02/16/18   CHOLECYSTECTOMY N/A 09/28/2018   Procedure: LAPAROSCOPIC CHOLECYSTECTOMY;  Surgeon: Abigail MiyamotoBlackman, Douglas, MD;  [facility];  Service: General;  Laterality: N/A;   DILATION AND CURETTAGE OF UTERUS  2009   SALPINGOOPHORECTOMY  2009   left   UNILATERAL SALPINGECTOMY Right 02/04/2018   Procedure: UNILATERAL SALPINGECTOMY;  Surgeon: Noland FordyceFogleman, Kelly, MD;  [facility];  Service: Obstetrics;  Laterality: Right;   WISDOM TOOTH EXTRACTION      Current Outpatient Medications  Medication Sig Dispense Refill   albuterol (PROVENTIL HFA;VENTOLIN HFA) 108 (90 Base) MCG/ACT inhaler Inhale 1 puff into the lungs every 6 (six) hours as needed for wheezing or shortness of breath.     azelastine (ASTELIN) 0.1 % nasal spray INHALE 1 2 PUFFS IN EACH NOSTRIL TWICE A DAY     azelastine (OPTIVAR) 0.05 % ophthalmic solution PUT 1 DROP INTO AFFECTED EYE 2 TIMES A DAY     EPINEPHrine 0.3 mg/0.3 mL IJ SOAJ injection epinephrine 0.3 mg/0.3 mL injection, auto-injector  INJECT 0.3  MLS (0.3 MG TOTAL) INTO THE MUSCLE ONCE FOR 1 DOSE.     fluticasone (FLONASE) 50 MCG/ACT nasal spray Place into the nose.     ipratropium-albuterol (DUONEB) 0.5-2.5 (3) MG/3ML SOLN Take 3 mLs by nebulization every 6 (six) hours as needed (for wheeze, coughing spells, chest tightness, SOB). 180 mL 1   levocetirizine (XYZAL) 5 MG tablet Take by mouth.     meloxicam (MOBIC) 7.5 MG tablet Take 7.5 mg by mouth 2 (two) times daily.  montelukast (SINGULAIR) 10 MG tablet Take by mouth.     ondansetron (ZOFRAN-ODT) 4 MG disintegrating tablet Take 1 tablet (4 mg total) by mouth every 8 (eight) hours as needed. 30 tablet 3   SUMAtriptan (IMITREX) 100 MG tablet Take 1 tablet (100 mg total) by mouth once as needed for up to 2 doses. May repeat in 2 hours if headache persists or recurs. 10 tablet 12   SYMBICORT 160-4.5 MCG/ACT inhaler INHALE 2 PUFFS INTO THE LUNGS IN THE MORNING AND AT BEDTIME. 10.2 each 1   meclizine (ANTIVERT) 25 MG tablet Take 0.5-1 tablets (12.5-25 mg total) by mouth 3 (three) times daily. 90 tablet 6   pantoprazole (PROTONIX) 40 MG tablet Take 1 tablet (40 mg total) by mouth 2 (two) times daily for 30 days, THEN 1 tablet (40 mg total) daily. 120 tablet 0   No current facility-administered medications for this visit.    Allergies as of 11/22/2021 - Review Complete 11/22/2021  Allergen Reaction Noted   Dust mite mixed allergen ext [mite (d. farinae)] Anaphylaxis 12/15/2017   Other Anaphylaxis and Other (See Comments) 12/15/2017    Vitals: BP 117/85    Pulse 91    Ht 5\' 2"  (1.575 m)    Wt 225 lb 12.8 oz (102.4 kg)    BMI 41.30 kg/m  Last Weight:  Wt Readings from Last 1 Encounters:  11/22/21 225 lb 12.8 oz (102.4 kg)   Last Height:   Ht Readings from Last 1 Encounters:  11/22/21 5\' 2"  (1.575 m)   Physical exam: Exam: Gen: NAD, conversant, well nourised, obese, well groomed                     CV: RRR, no MRG. No Carotid Bruits. No peripheral edema, warm, nontender Eyes:  Conjunctivae clear without exudates or hemorrhage  Neuro: Detailed Neurologic Exam  Speech:    Speech is normal; fluent and spontaneous with normal comprehension.  Cognition:    The patient is oriented to person, place, and time;     recent and remote memory intact;     language fluent;     normal attention, concentration,     fund of knowledge Cranial Nerves:    The pupils are equal, round, and reactive to light. The fundi are flat Visual fields are full to finger confrontation. Extraocular movements are intact. Trigeminal sensation is intact and the muscles of mastication are normal. The face is symmetric. The palate elevates in the midline. Hearing intact. Voice is normal. Shoulder shrug is normal. The tongue has normal motion without fasciculations.   Coordination:    Normal   Gait:  normal.   Motor Observation:    No asymmetry, no atrophy, and no involuntary movements noted. Tone:    Normal muscle tone.    Posture:    Posture is normal. normal erect    Strength:    Strength is V/V in the upper and lower limbs.      Sensation: intact to LT     Reflex Exam:  DTR's:    Deep tendon reflexes in the upper and lower extremities are normal bilaterally.   Toes:    The toes are downgoing bilaterally.   Clonus:    Clonus is absent.    Assessment/Plan: This is a patient was seen in the past for migraines.  We initially saw her while she was pregnant and/or breast-feeding, at that time we used Excedrin Periactin, when she last saw 01/20/22 for her migraine she  was having about 1 a week treated well with sumatriptan hand and this continues to be her frequency doing well treating with a triptan.  Today she is here for new onset of dizziness, dizzy spells, vertiginous symptoms room spinning, appears to happen with head movement but also without head movement, gets dizzy riding in a car.  We discussed a long etiology of dizziness and vertigo including benign positional peripheral  vertigo, lab otitis or neuronitis, stroke, vestibular migraines.  She has seen ENT and she has testing next week.  Her migraine frequency appears to be stable and the dizziness is not related, I doubt this is vestibular migraines.  Need to check MRI of the brain for stroke or schwannoma or other lesions.  Needs to follow-up with ENT.  Plan:  -MRI of the brain w/wo contrast -ongoing vertigo more than 3 months, treated conservatively with failure, been to ENT, been to primary care, tried Valium with side effects, now having concerning symptoms of positional qualities, left hearing changes, blurry vision needs MRI of the brain to evaluate for schwannoma, strokes or other lesions. -Continue with testing with ENT -Vestibular Therapy next door -Consider a steroid burst 6 days (can ask ENT as well or mychart me after appoitment) -Continue as needed sumatriptan for 1x a week migraines but may want to consider a migraine preventative -Ondansetron for nausea and vertigo as needed -Meclizine 12.5mg  to 25mg  three times a day for a few weeks and see if it helps then as needed.  -Keep me updated, if needed mychart me for advice or medication changes and if needed will bring back into the office  Meds ordered this encounter  Medications   meclizine (ANTIVERT) 25 MG tablet    Sig: Take 0.5-1 tablets (12.5-25 mg total) by mouth 3 (three) times daily.    Dispense:  90 tablet    Refill:  6   ondansetron (ZOFRAN-ODT) 4 MG disintegrating tablet    Sig: Take 1 tablet (4 mg total) by mouth every 8 (eight) hours as needed.    Dispense:  30 tablet    Refill:  3   SUMAtriptan (IMITREX) 100 MG tablet    Sig: Take 1 tablet (100 mg total) by mouth once as needed for up to 2 doses. May repeat in 2 hours if headache persists or recurs.    Dispense:  10 tablet    Refill:  12   Orders Placed This Encounter  Procedures   MR BRAIN W WO CONTRAST   Ambulatory referral to Physical Therapy      Naomie Dean,  MD  [facility] [address] [place], [place] [address]  Phone [phone] Fax [phone]  I spent over 60 minutes of face-to-face and non-face-to-face time with patient on the  1. Vertigo   2. Benign paroxysmal positional vertigo, unspecified laterality   3. Dizziness   4. Chronic nonintractable headache, unspecified headache type   5. Vision changes   6. Positional headache   7. Subjective hearing change of left ear   8. Migraine without aura and without status migrainosus, not intractable    diagnosis.  This included previsit chart review, lab review, study review, order entry, electronic health record documentation, patient education on the different diagnostic and therapeutic options, counseling and coordination of care, risks and benefits of management, compliance, or risk factor reduction

## 2021-11-22 NOTE — Patient Instructions (Addendum)
MRI of the brain w/wo contrast Continue with testing with ENT Vestibular Therapy next door Consider a steroid burst 6 days (can ask ENT as well or mychart me after appoitment) Continue as needed sumatriptan for 1x a week migraines but may want to consider a migraine preventative Ondansetron for nausea and vertigo as needed Meclizine 12.5mg  to 25mg  three times a day for a few weeks and see if it helps then as needed.  Keep me updated, if needed mychart me for advice or medication changes and if needed will bring back into the office    Vertigo Vertigo is the feeling that you or your surroundings are moving when they are not. This feeling can come and go at any time. Vertigo often goes away on its own. Vertigo can be dangerous if it occurs while you are doing something that could endanger yourself or others, such as driving or operating machinery. Your health care provider will do tests to try to determine the cause of your vertigo. Tests will also help your health care provider decide how best to treat your condition. Follow these instructions at home: Eating and drinking   Dehydration can make vertigo worse. Drink enough fluid to keep your urine pale yellow. Do not drink alcohol. Activity Return to your normal activities as told by your health care provider. Ask your health care provider what activities are safe for you. In the morning, first sit up on the side of the bed. When you feel okay, stand slowly while you hold onto something until you know that your balance is fine. Move slowly. Avoid sudden body or head movements or certain positions, as told by your health care provider. If you have trouble walking or keeping your balance, try using a cane for stability. If you feel dizzy or unstable, sit down right away. Avoid doing any tasks that would cause danger to you or others if vertigo occurs. Avoid bending down if you feel dizzy. Place items in your home so that they are easy for you to  reach without bending or leaning over. Do not drive or use machinery if you feel dizzy. General instructions Take over-the-counter and prescription medicines only as told by your health care provider. Keep all follow-up visits. This is important. Contact a health care provider if: Your medicines do not relieve your vertigo or they make it worse. Your condition gets worse or you develop new symptoms. You have a fever. You develop nausea or vomiting, or if nausea gets worse. Your family or friends notice any behavioral changes. You have numbness or a prickling and tingling sensation in part of your body. Get help right away if you: Are always dizzy or you faint. Develop severe headaches. Develop a stiff neck. Develop sensitivity to light. Have difficulty moving or speaking. Have weakness in your hands, arms, or legs. Have changes in your hearing or vision. These symptoms may represent a serious problem that is an emergency. Do not wait to see if the symptoms will go away. Get medical help right away. Call your local emergency services (911 in the [place] Do not drive yourself to the hospital. Summary Vertigo is the feeling that you or your surroundings are moving when they are not. Your health care provider will do tests to try to determine the cause of your vertigo. Follow instructions for home care. You may be told to avoid certain tasks, positions, or movements. Contact a health care provider if your medicines do not relieve your symptoms, or if you  have a fever, nausea, vomiting, or changes in behavior. Get help right away if you have severe headaches or difficulty speaking, or you develop hearing or vision problems. This information is not intended to replace advice given to you by your health care provider. Make sure you discuss any questions you have with your health care provider. Document Revised: 09/05/2020 Document Reviewed: 09/05/2020 Elsevier Patient Education  2022 [employer].

## 2021-11-24 ENCOUNTER — Encounter: Payer: Self-pay | Admitting: Neurology

## 2021-11-24 DIAGNOSIS — R42 Dizziness and giddiness: Secondary | ICD-10-CM | POA: Insufficient documentation

## 2021-11-25 ENCOUNTER — Ambulatory Visit: Payer: 59 | Attending: Neurology

## 2021-11-25 ENCOUNTER — Other Ambulatory Visit: Payer: Self-pay

## 2021-11-25 DIAGNOSIS — R42 Dizziness and giddiness: Secondary | ICD-10-CM

## 2021-11-25 DIAGNOSIS — H8110 Benign paroxysmal vertigo, unspecified ear: Secondary | ICD-10-CM | POA: Diagnosis not present

## 2021-11-25 DIAGNOSIS — R2681 Unsteadiness on feet: Secondary | ICD-10-CM

## 2021-11-25 NOTE — Therapy (Signed)
[facility] [address] [place], [place], [address] Phone: [phone]   Fax:  [phone]  Physical Therapy Evaluation  Patient Details  Name: Sheri Malone MRN: [id number] Date of Birth: 1986/05/03 Referring Provider (PT): Naomie Dean, MD   Encounter Date: 11/25/2021   PT End of Session - 11/25/21 1156     Visit Number 1    Number of Visits 13    Date for PT Re-Evaluation 01/10/22    Authorization Type UHC    PT Start Time 1101    PT Stop Time 1144    PT Time Calculation (min) 43 min    Activity Tolerance Patient tolerated treatment well    Behavior During Therapy [facility] for tasks assessed/performed             Past Medical History:  Diagnosis Date   Abnormal Pap smear    Asthma    GERD (gastroesophageal reflux disease)    Gestational HTN    Headache(784.0)    History of ectopic pregnancy    History of gestational diabetes    Low hemoglobin    on ironspan   Rhinitis    Vaginal Pap smear, abnormal     Past Surgical History:  Procedure Laterality Date   CESAREAN SECTION N/A 05/20/2013   Procedure: CESAREAN SECTION;  Surgeon: Genia Del, MD;  Location: [facility];  Service: Obstetrics;  Laterality: N/A;  primary   CESAREAN SECTION N/A 02/04/2018   Procedure: Repeat CESAREAN SECTION;  Surgeon: Noland Fordyce, MD;  Location: [facility] BIRTHING SUITES;  Service: Obstetrics;  Laterality: N/A;  EDD: 02/16/18   CHOLECYSTECTOMY N/A 09/28/2018   Procedure: LAPAROSCOPIC CHOLECYSTECTOMY;  Surgeon: Abigail Miyamoto, MD;  Location: [facility] OR;  Service: General;  Laterality: N/A;   DILATION AND CURETTAGE OF UTERUS  2009   SALPINGOOPHORECTOMY  2009   left   UNILATERAL SALPINGECTOMY Right 02/04/2018   Procedure: UNILATERAL SALPINGECTOMY;  Surgeon: Noland Fordyce, MD;  Location: [facility] BIRTHING SUITES;  Service: Obstetrics;  Laterality: Right;   WISDOM TOOTH EXTRACTION      There were no vitals filed for this visit.     Subjective Assessment - 11/25/21 1103     Subjective Patient reports sitting at her desk in Nov. Patient reports she turned her head to the right. Reports since that we have been having a sensation of being on a boat (constant motion). Reports on severe day she feels as the room is spinning (occurs with movements). Reports nausea, no vomitting. Tinnitus and reports some fullness in the L ear. No vision changes to report. No falls. Follows up with ENT tomorrow. Will be having an MRI, but has not scheduled it. Patient has a history of migraine, reports one about monthly on the L side of the head. Patient reports did have a cold around the time of initial attack.    Pertinent History Asthma, GERD, Gestational HTN, HA, Rhinitis    Limitations Standing;Walking    Patient Stated Goals Feel more stable on your feet    Currently in Pain? No/denies                [facility] PT Assessment - 11/25/21 0001       Assessment   Medical Diagnosis Vertigo    Referring Provider (PT) Naomie Dean, MD    Onset Date/Surgical Date 11/22/21   referral date; initial episode in Nov 2022   Hand Dominance Right      Precautions   Precautions Other (comment)  Precaution Comments Asthma, GERD, Gestational HTN, HA, Rhinitis      Restrictions   Weight Bearing Restrictions No      Balance Screen   Has the patient fallen in the past 6 months No    Has the patient had a decrease in activity level because of a fear of falling?  No    Is the patient reluctant to leave their home because of a fear of falling?  No      Home Nurse, mental health Private residence    Living Arrangements Spouse/significant other;Children    Available Help at Discharge Family    Type of Home House    Home Access Stairs to enter    Entrance Stairs-Number of Steps 3    Home Layout Two level    Alternate Level Stairs-Number of Steps 12-14      Prior Function   Level of Independence Independent    Vocation Full time  employment    [employer]      Cognition   Overall Cognitive Status Within Functional Limits for tasks assessed      Observation/Other Assessments   Focus on Therapeutic Outcomes Forensic psychologist)  Staff Did Not Capture      Sensation   Light Touch Appears Intact      Coordination   Gross Motor Movements are Fluid and Coordinated Yes      ROM / Strength   AROM / PROM / Strength Strength      Strength   Overall Strength Within functional limits for tasks performed      Transfers   Transfers Sit to Stand;Stand to Sit    Sit to Stand 7: Independent    Stand to Sit 7: Independent      Ambulation/Gait   Ambulation/Gait Yes    Ambulation/Gait Assistance 7: Independent    Assistive device None    Gait Pattern Within Functional Limits    Ambulation Surface Level;Indoor                    Vestibular Assessment - 11/25/21 0001       Symptom Behavior   Subjective history of current problem See subjective    Type of Dizziness  Spinning;Unsteady with head/body turns;Vertigo    Frequency of Dizziness Daily    Duration of Dizziness duration of movement; throughout the day    Symptom Nature Positional    Aggravating Factors Lying supine;Activity in general;Turning body quickly;Turning head quickly    Relieving Factors Rest;Slow movements   Taking Meclizine   Progression of Symptoms No change since onset    History of similar episodes No history      Oculomotor Exam   Ocular ROM WNL    Spontaneous Absent    Gaze-induced  Comment   unable to assess fully; due to closing eyes when looking at R side due to symptoms   Smooth Pursuits Intact    Saccades Intact   hesistant to look onto R side, with increased dizziness (mild)     Oculomotor Exam-Fixation Suppressed    Left Head Impulse unable to tolerate testing    Right Head Impulse unable to tolerate testing      Vestibulo-Ocular Reflex   VOR 1 Head Only (x 1 viewing) Moderate dizziness; unable to maintain  eyes focused    VOR Cancellation Normal   Normal; increased dizziness with head movement to the R     Visual Acuity   Static 7    Dynamic  5      Positional Testing   Dix-Hallpike Dix-Hallpike Right;Dix-Hallpike Left    Sidelying Test Sidelying Right;Sidelying Left    Horizontal Canal Testing Horizontal Canal Right;Horizontal Canal Left      Dix-Hallpike Right   Dix-Hallpike Right Duration 0    Dix-Hallpike Right Symptoms No nystagmus      Dix-Hallpike Left   Dix-Hallpike Left Duration 0    Dix-Hallpike Left Symptoms No nystagmus      Sidelying Right   Sidelying Right Duration 0    Sidelying Right Symptoms No nystagmus      Sidelying Left   Sidelying Left Duration 0    Sidelying Left Symptoms No nystagmus      Horizontal Canal Right   Horizontal Canal Right Duration 0    Horizontal Canal Right Symptoms Normal      Horizontal Canal Left   Horizontal Canal Left Duration 0    Horizontal Canal Left Symptoms Normal      Positional Sensitivities   Sit to Supine No dizziness    Supine to Left Side No dizziness    Supine to Right Side No dizziness    Supine to Sitting Mild dizziness    Right Hallpike No dizziness    Up from Right Hallpike Mild dizziness    Up from Left Hallpike Mild dizziness    Rolling Right No dizziness    Rolling Left No dizziness                Objective measurements completed on examination: See above findings.                PT Education - 11/25/21 1156     Education Details POC/Eval Findings; No BPPV; Differential Diagnosis of Meniere's vs. Neuritis.    Person(s) Educated Patient    Methods Explanation    Comprehension Verbalized understanding              PT Short Term Goals - 11/25/21 1202       PT SHORT TERM GOAL #1   Title Pt will be independent with initial HEP for balance/vestibular (ALL STGs Due: 12/16/21)    Baseline no HEP established    Time 3    Period Weeks    Status New    Target Date 12/16/21       PT SHORT TERM GOAL #2   Title FGA TBA and LTG to be set as applicable    Baseline TBA    Time 3    Period Weeks    Status New      PT SHORT TERM GOAL #3   Title SOT TBA and LTG to be set as applicable    Baseline TBA    Time 3    Period Weeks    Status New               PT Long Term Goals - 11/25/21 1204       PT LONG TERM GOAL #1   Title Pt will be independent with final/progressive vestibular/balance HEP (ALL LTGs Due: 01/10/22)    Baseline no HEP established    Time 6    Period Weeks    Status New    Target Date 01/10/22      PT LONG TERM GOAL #2   Title LTG to be set for SOT    Baseline TBA    Time 6    Period Weeks    Status New      PT LONG TERM  GOAL #3   Title LTG to be set for FGA    Baseline TBA    Time 6    Period Weeks    Status New      PT LONG TERM GOAL #4   Title Pt will improve all components of MSQ to </= 1/5 to demo improved activity tolernace for functional tasks    Baseline 2-3/5 (mod dizziness)    Time 6    Period Weeks    Status New                    Plan - 11/25/21 1209     Clinical Impression Statement Patient is a 36 y.o. female referred to Neuro OPPT services for Vertigo. Patient's PMH significant for the following: Asthma, GERD, Gestational HTN, HA, Rhinitis. Upon evaluation, patient presents with negative positional testing indicating no signs of current BPPV. Patietn did present with abnormal oculomotor exam, dizziness, increased motion senstivity, and impaired balance. PT educating on potential differential diagnosis of Meniere's vs. Neuritis. To be further evaluated by ENT. Patient will benefit from PT services to address impairments and maximize functional mobility.    Personal Factors and Comorbidities Comorbidity 2;Time since onset of injury/illness/exacerbation    Comorbidities Asthma, GERD, Gestational HTN, HA, Rhinitis    Examination-Activity Limitations Bend;Caring for Others;Locomotion Level     Examination-Participation Restrictions Occupation;Community Activity    Stability/Clinical Decision Making Stable/Uncomplicated    Clinical Decision Making Low    Rehab Potential Good    PT Frequency 2x / week    PT Duration 6 weeks    PT Treatment/Interventions ADLs/Self Care Home Management;Canalith Repostioning;Electrical Stimulation;DME Instruction;Moist Heat;Gait training;Stair training;Functional mobility training;Therapeutic activities;Therapeutic exercise;Balance training;Neuromuscular re-education;Manual techniques;Patient/family education;Vestibular;Dry needling    PT Next Visit Plan Potential to try to assess HIT. Assess FGA/SOT and update goals. Initiate balance HEP. Trial VOR and potential smooth pursuit/saccades due to symptoms.    Consulted and Agree with Plan of Care Patient             Patient will benefit from skilled therapeutic intervention in order to improve the following deficits and impairments:  Pain, Dizziness, Decreased activity tolerance, Decreased balance  Visit Diagnosis: Dizziness and giddiness  Unsteadiness on feet     Problem List Patient Active Problem List   Diagnosis Date Noted   Vertigo 11/24/2021   Splenic artery aneurysm (HCC) 08/06/2021   Effusion of right ankle 07/31/2021   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   Iron deficiency anemia 11/15/2020   Slow transit constipation 11/15/2020   Microcytosis 11/15/2020   Arthralgia of right ankle 08/15/2020   Stiffness of right ankle joint 08/15/2020   Closed trimalleolar fracture of right ankle 05/21/2020   Gastro-esophageal reflux disease without esophagitis 04/13/2020   Morbid obesity (HCC) 01/20/2020   Vitamin D deficiency 01/20/2020   Prediabetes 01/20/2020   Insulin resistance 01/20/2020   Metabolic syndrome 01/20/2020   Allergic rhinitis 11/23/2016   Allergic conjunctivitis 01/17/2016   Mild intermittent asthma 01/17/2016   Dermatitis, eczematoid 06/21/2015   H/O cesarean  section 06/21/2015   History of diabetes mellitus arising in pregnancy 06/21/2015   Headache, migraine 06/21/2015    Tempie Donning, PT, DPT 11/25/2021, 12:15 PM  [facility] [address] [place], [place], [address] Phone: [phone]   Fax:  [phone]  Name: Sheri Malone MRN: [id number] Date of Birth: Feb 20, 1986

## 2021-11-26 ENCOUNTER — Encounter: Payer: Self-pay | Admitting: Neurology

## 2021-11-27 ENCOUNTER — Other Ambulatory Visit: Payer: Self-pay | Admitting: Neurology

## 2021-11-27 MED ORDER — METHYLPREDNISOLONE 4 MG PO TBPK: ORAL_TABLET | ORAL | 1 refills | Status: DC

## 2021-11-28 ENCOUNTER — Telehealth: Payer: Self-pay | Admitting: Neurology

## 2021-11-28 ENCOUNTER — Ambulatory Visit: Payer: 59 | Admitting: Gastroenterology

## 2021-11-28 NOTE — Telephone Encounter (Signed)
MR Brain w/wo contrast Dr. Lucia Gaskins [facility] Berkley Harvey: [id number] (exp. 11/27/21 to 01/11/22). Patient is scheduled at [facility] for 12/11/21

## 2021-12-04 ENCOUNTER — Other Ambulatory Visit: Payer: Self-pay | Admitting: Family Medicine

## 2021-12-04 DIAGNOSIS — K219 Gastro-esophageal reflux disease without esophagitis: Secondary | ICD-10-CM

## 2021-12-05 ENCOUNTER — Other Ambulatory Visit: Payer: Self-pay

## 2021-12-05 ENCOUNTER — Ambulatory Visit: Payer: 59 | Admitting: Physical Therapy

## 2021-12-05 DIAGNOSIS — R2681 Unsteadiness on feet: Secondary | ICD-10-CM

## 2021-12-05 DIAGNOSIS — R42 Dizziness and giddiness: Secondary | ICD-10-CM

## 2021-12-05 NOTE — Patient Instructions (Signed)
°  Gaze Stabilization: Tip Card  1.Target must remain in focus, not blurry, and appear stationary while head is in motion. 2.Perform exercises with small head movements (45 to either side of midline). 3.Increase speed of head motion so long as target is in focus. 4.If you wear eyeglasses, be sure you can see target through lens (therapist will give specific instructions for bifocal / progressive lenses). 5.These exercises may provoke dizziness or nausea. Work through these symptoms. If too dizzy, slow head movement slightly. Rest between each exercise. 6.Exercises demand concentration; avoid distractions. 7.For safety, perform standing exercises close to a counter, wall, corner, or next to someone.  Copyright  VHI. All rights reserved.    Gaze Stabilization: Standing Feet Apart    Feet shoulder width apart, keeping eyes on target on wall _4-5___ feet away, tilt head down 15-30 and move head side to side for _30___ seconds. Repeat while moving head up and down for __30__ seconds. Do _3___ sessions per day. Repeat using target on pattern background.  Copyright  VHI. All rights reserved.

## 2021-12-06 ENCOUNTER — Ambulatory Visit: Payer: 59

## 2021-12-06 NOTE — Therapy (Signed)
[facility] [address] [place], [place], [address] Phone: [phone]   Fax:  [phone]  Physical Therapy Treatment  Patient Details  Name: Sheri Malone MRN: [id number] Date of Birth: Mar 27, 1986 Referring Provider (PT): Naomie Dean, MD   Encounter Date: 12/05/2021   PT End of Session - 12/06/21 0815     Visit Number 2    Number of Visits 13    Date for PT Re-Evaluation 01/10/22    Authorization Type UHC    PT Start Time 1020    PT Stop Time 1100    PT Time Calculation (min) 40 min    Equipment Utilized During Treatment Other (comment)   harness vest used with SOT   Activity Tolerance Patient tolerated treatment well    Behavior During Therapy [facility] for tasks assessed/performed             Past Medical History:  Diagnosis Date   Abnormal Pap smear    Asthma    GERD (gastroesophageal reflux disease)    Gestational HTN    Headache(784.0)    History of ectopic pregnancy    History of gestational diabetes    Low hemoglobin    on ironspan   Rhinitis    Vaginal Pap smear, abnormal     Past Surgical History:  Procedure Laterality Date   CESAREAN SECTION N/A 05/20/2013   Procedure: CESAREAN SECTION;  Surgeon: Genia Del, MD;  Location: [facility];  Service: Obstetrics;  Laterality: N/A;  primary   CESAREAN SECTION N/A 02/04/2018   Procedure: Repeat CESAREAN SECTION;  Surgeon: Noland Fordyce, MD;  Location: [facility] BIRTHING SUITES;  Service: Obstetrics;  Laterality: N/A;  EDD: 02/16/18   CHOLECYSTECTOMY N/A 09/28/2018   Procedure: LAPAROSCOPIC CHOLECYSTECTOMY;  Surgeon: Abigail Miyamoto, MD;  Location: [facility] OR;  Service: General;  Laterality: N/A;   DILATION AND CURETTAGE OF UTERUS  2009   SALPINGOOPHORECTOMY  2009   left   UNILATERAL SALPINGECTOMY Right 02/04/2018   Procedure: UNILATERAL SALPINGECTOMY;  Surgeon: Noland Fordyce, MD;  Location: [facility] BIRTHING SUIT[facility];  Service: Obstetrics;  Laterality: Right;    WISDOM TOOTH EXTRACTION      There were no vitals filed for this visit.   Subjective Assessment - 12/05/21 1020     Subjective Pt reports she was prescribed a Z-pak which has helped to reduce the dizziness so that the "boat rocking feeling" is not as constant as it was; does not feel dizzy at this time but feels the dysequilibrium sensation; pt reports she has MRI scheduled next Wed., the 22nd    Pertinent History Asthma, GERD, Gestational HTN, HA, Rhinitis    Limitations Standing;Walking    Patient Stated Goals Feel more stable on your feet    Currently in Pain? No/denies                  Sensory Organization Test Composite score 74/100 (WNL's); N= 70/100  Somatosensory, visual and vestibular inputs all WNL's  Condition 1 - all 3 trials WNL's Condition 2 - all 3 trials WNL's Condition 3 - all 3 trials WNL's Condition 4 - all 3 trials WNL's Condition 5 - trials 1 & 3 WNL's; trial 2 slightly decreased by approx. 3 points from N Condition 6 - all 3 trials WNL's    COG - bias to Right side - centered and slightly posterior Strategy analysis - balance between hip and ankle - not heavily reliant on either strategy  Vestibular Treatment/Exercise - 12/06/21 0001       Vestibular Treatment/Exercise   Habituation Exercises Comment   performed repeated bending down to floor to retreive object 5 times - performed at slower speed - dizziness remain unchanged   Gaze Exercises X1 Viewing Horizontal;X1 Viewing Vertical      X1 Viewing Horizontal   Foot Position seated position 1st rep; 30 secs; standing position 2nd rep    Time --   30 secs x 2 reps   Reps 2    Comments pt reported more dizziness was provoked with performing ex with horizontal head turns in seated position than in standing   target approx. 4' away     X1 Viewing Vertical   Foot Position seated position 1st rep; standing position 2nd rep    Time --   30 secs x 2 reps   Reps 2    Comments  reported less dizziness with vertical head turns than with horizontal - reported no change in dizziness with seated vs. standing position - overall less dizziness provoked with vertical than with horizontal   target held approx. 4' away               Balance Exercises - 12/06/21 0001       Balance Exercises: Standing   Gait with Head Turns Forward;2 reps   25' x 2 reps; 1 rep with horizontal head turns, 1 rep with vertical head turns;  increased dizziness reported with vertical head turns than with horizontal - pt initially performed vertical head turns quickly - was able to stop, then perform slowly               PT Education - 12/06/21 0814     Education Details issued gaze stabilization ex. - x1 viewing- in standing position - horizontal and vertical head turns    Person(s) Educated Patient    Methods Explanation;Demonstration;Handout    Comprehension Verbalized understanding;Returned demonstration              PT Short Term Goals - 12/06/21 0816       PT SHORT TERM GOAL #1   Title Pt will be independent with initial HEP for balance/vestibular (ALL STGs Due: 12/16/21)    Baseline no HEP established    Time 3    Period Weeks    Status New    Target Date 12/16/21      PT SHORT TERM GOAL #2   Title FGA TBA and LTG to be set as applicable    Baseline TBA    Time 3    Period Weeks    Status New      PT SHORT TERM GOAL #3   Title SOT TBA and LTG to be set as applicable    Baseline TBA;  completed on 12-05-21 - goal deferred as score is WNL's    Time 3    Period Weeks    Status Deferred               PT Long Term Goals - 12/06/21 5183       PT LONG TERM GOAL #1   Title Pt will be independent with final/progressive vestibular/balance HEP (ALL LTGs Due: 01/10/22)    Baseline no HEP established    Time 6    Period Weeks    Status New    Target Date 01/10/22      PT LONG TERM GOAL #2   Title LTG to be set for SOT  Baseline TBA : deferred on  12-05-21    Time 6    Period Weeks    Status Deferred      PT LONG TERM GOAL #3   Title LTG to be set for FGA    Baseline TBA    Time 6    Period Weeks    Status New      PT LONG TERM GOAL #4   Title Pt will improve all components of MSQ to </= 1/5 to demo improved activity tolernace for functional tasks    Baseline 2-3/5 (mod dizziness)    Time 6    Period Weeks    Status New                   Plan - 12/06/21 0817     Clinical Impression Statement Pt's SOT score is WNL's with composite score 74/100 with N=70/100;  somatosensory, visual and vestibular inputs all WNL's with all trials WNL's except trial 2 on condition 5 being only very slightly decreased from N (by approx 3 points).  STG #3 deferred due to score WNL's.  Pt reported increased dizziness provoked with seated horizontal x1 viewing compared to vertical x1 viewing exercise in seated position. Pt able to perform repeated bending over from standing position to retrieve tissue box from floor and average speed with dizziness remaining the same for all 5 reps.  Pt reported increased dizziness with vertical head turns with amb.  when looked up quickly but was able to stop for approx. 10 secs and resume activity with slower speed of vertical head turns.  Pt is scheduled for MRI on 12-11-21; reduce frequency to 1x/week for next week and then will re-evaluate.    Personal Factors and Comorbidities Comorbidity 2;Time since onset of injury/illness/exacerbation    Comorbidities Asthma, GERD, Gestational HTN, HA, Rhinitis    Examination-Activity Limitations Bend;Caring for Others;Locomotion Level    Examination-Participation Restrictions Occupation;Community Activity    Stability/Clinical Decision Making Stable/Uncomplicated    Rehab Potential Good    PT Frequency 2x / week    PT Duration 6 weeks    PT Treatment/Interventions ADLs/Self Care Home Management;Canalith Repostioning;Electrical Stimulation;DME Instruction;Moist  Heat;Gait training;Stair training;Functional mobility training;Therapeutic activities;Therapeutic exercise;Balance training;Neuromuscular re-education;Manual techniques;Patient/family education;Vestibular;Dry needling    PT Next Visit Plan Potential to try to assess HIT (unable to attempt on 2-16 due to provocation of dizziness with this test). Assess FGA and update goals. Initiate balance HEP.   ( I issued x1 viewing in standing on 2-16)Trial VOR and potential smooth pursuit/saccades due to symptoms.    Consulted and Agree with Plan of Care Patient             Patient will benefit from skilled therapeutic intervention in order to improve the following deficits and impairments:  Pain, Dizziness, Decreased activity tolerance, Decreased balance  Visit Diagnosis: Dizziness and giddiness  Unsteadiness on feet     Problem List Patient Active Problem List   Diagnosis Date Noted   Vertigo 11/24/2021   Splenic artery aneurysm (HCC) 08/06/2021   Effusion of right ankle 07/31/2021   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   Iron deficiency anemia 11/15/2020   Slow transit constipation 11/15/2020   Microcytosis 11/15/2020   Arthralgia of right ankle 08/15/2020   Stiffness of right ankle joint 08/15/2020   Closed trimalleolar fracture of right ankle 05/21/2020   Gastro-esophageal reflux disease without esophagitis 04/13/2020   Morbid obesity (HCC) 01/20/2020   Vitamin D deficiency 01/20/2020   Prediabetes 01/20/2020  Insulin resistance 01/20/2020   Metabolic syndrome 01/20/2020   Allergic rhinitis 11/23/2016   Allergic conjunctivitis 01/17/2016   Mild intermittent asthma 01/17/2016   Dermatitis, eczematoid 06/21/2015   H/O cesarean section 06/21/2015   History of diabetes mellitus arising in pregnancy 06/21/2015   Headache, migraine 06/21/2015    Kary Kos, PT 12/06/2021, 8:28 AM  [facility] [address]  [address] [place], [place], [address] Phone: [phone]   Fax:  [phone]  Name: Sheri Malone MRN: [id number] Date of Birth: [birth year]

## 2021-12-09 ENCOUNTER — Ambulatory Visit: Payer: 59

## 2021-12-11 ENCOUNTER — Ambulatory Visit: Payer: 59

## 2021-12-11 DIAGNOSIS — R42 Dizziness and giddiness: Secondary | ICD-10-CM | POA: Diagnosis not present

## 2021-12-11 DIAGNOSIS — H539 Unspecified visual disturbance: Secondary | ICD-10-CM

## 2021-12-11 DIAGNOSIS — H9192 Unspecified hearing loss, left ear: Secondary | ICD-10-CM | POA: Diagnosis not present

## 2021-12-11 DIAGNOSIS — R51 Headache with orthostatic component, not elsewhere classified: Secondary | ICD-10-CM | POA: Diagnosis not present

## 2021-12-11 DIAGNOSIS — R519 Headache, unspecified: Secondary | ICD-10-CM

## 2021-12-11 DIAGNOSIS — G8929 Other chronic pain: Secondary | ICD-10-CM

## 2021-12-11 MED ORDER — GADOBENATE DIMEGLUMINE 529 MG/ML IV SOLN
20.0000 mL | Freq: Once | INTRAVENOUS | Status: AC | PRN
  Administered 2021-12-11: 20 mL via INTRAVENOUS

## 2021-12-12 ENCOUNTER — Ambulatory Visit: Payer: 59

## 2021-12-17 ENCOUNTER — Other Ambulatory Visit: Payer: Self-pay

## 2021-12-17 ENCOUNTER — Ambulatory Visit: Payer: 59

## 2021-12-17 DIAGNOSIS — R42 Dizziness and giddiness: Secondary | ICD-10-CM

## 2021-12-17 DIAGNOSIS — R2681 Unsteadiness on feet: Secondary | ICD-10-CM

## 2021-12-17 NOTE — Therapy (Signed)
[facility] [address] [place], [place], [address] Phone: [phone]   Fax:  [phone]  Physical Therapy Treatment  Patient Details  Name: Sheri Malone MRN: [id number] Date of Birth: [birth year] Referring Provider (PT): Sarina Ill, MD   Encounter Date: 12/17/2021   PT End of Session - 12/17/21 0848     Visit Number 3    Number of Visits 13    Date for PT Re-Evaluation 01/10/22    Authorization Type UHC    PT Start Time 0848   Pt arriving late   PT Stop Time 0925    PT Time Calculation (min) 37 min    Equipment Utilized During Treatment Other (comment)   harness vest used with SOT   Activity Tolerance Patient tolerated treatment well    Behavior During Therapy [facility] for tasks assessed/performed             Past Medical History:  Diagnosis Date   Abnormal Pap smear    Asthma    GERD (gastroesophageal reflux disease)    Gestational HTN    Headache(784.0)    History of ectopic pregnancy    History of gestational diabetes    Low hemoglobin    on ironspan   Rhinitis    Vaginal Pap smear, abnormal     Past Surgical History:  Procedure Laterality Date   CESAREAN SECTION N/A 05/20/2013   Procedure: CESAREAN SECTION;  Surgeon: Princess Bruins, MD;  Location: [facility];  Service: Obstetrics;  Laterality: N/A;  primary   CESAREAN SECTION N/A 02/04/2018   Procedure: Repeat CESAREAN SECTION;  Surgeon: Aloha Gell, MD;  Location: [place];  Service: Obstetrics;  Laterality: N/A;  EDD: 02/16/18   CHOLECYSTECTOMY N/A 09/28/2018   Procedure: LAPAROSCOPIC CHOLECYSTECTOMY;  Surgeon: Coralie Keens, MD;  Location: [facility];  Service: General;  Laterality: N/A;   [place] OF UTERUS  2009   SALPINGOOPHORECTOMY  2009   left   UNILATERAL SALPINGECTOMY Right 02/04/2018   Procedure: UNILATERAL SALPINGECTOMY;  Surgeon: Aloha Gell, MD;  Location: [place];  Service: Obstetrics;   Laterality: Right;   WISDOM TOOTH EXTRACTION      There were no vitals filed for this visit.   Subjective Assessment - 12/17/21 0849     Subjective Patient reports that the dizziness has been doing alot better at this time. Denies any episodes. MRI was Normal. Reports no recent migraines.    Pertinent History Asthma, GERD, Gestational HTN, HA, Rhinitis    Limitations Standing;Walking    Diagnostic tests Normal MRI Brain    Patient Stated Goals Feel more stable on your feet    Currently in Pain? No/denies                [facility] PT Assessment - 12/17/21 0001       Functional Gait  Assessment   Gait assessed  Yes    Gait Level Surface Walks 20 ft in less than 5.5 sec, no assistive devices, good speed, no evidence for imbalance, normal gait pattern, deviates no more than 6 in outside of the 12 in walkway width.    Change in Gait Speed Able to smoothly change walking speed without loss of balance or gait deviation. Deviate no more than 6 in outside of the 12 in walkway width.    Gait with Horizontal Head Turns Performs head turns smoothly with slight change in gait velocity (eg, minor disruption to smooth gait path), deviates 6-10 in outside 12  in walkway width, or uses an assistive device.    Gait with Vertical Head Turns Performs head turns with no change in gait. Deviates no more than 6 in outside 12 in walkway width.    Gait and Pivot Turn Pivot turns safely within 3 sec and stops quickly with no loss of balance.    Step Over Obstacle Is able to step over 2 stacked shoe boxes taped together (9 in total height) without changing gait speed. No evidence of imbalance.    Gait with Narrow Base of Support Is able to ambulate for 10 steps heel to toe with no staggering.    Gait with Eyes Closed Walks 20 ft, no assistive devices, good speed, no evidence of imbalance, normal gait pattern, deviates no more than 6 in outside 12 in walkway width. Ambulates 20 ft in less than 7 sec.    Ambulating  Backwards Walks 20 ft, no assistive devices, good speed, no evidence for imbalance, normal gait    Steps Alternating feet, no rail.    Total Score 29    FGA comment: 29/30               Vestibular Treatment/Exercise - 12/17/21 0001       Vestibular Treatment/Exercise   Vestibular Treatment Provided Gaze    Gaze Exercises X1 Viewing Horizontal;X1 Viewing Vertical      X1 Viewing Horizontal   Foot Position standing    Reps 2    Comments x 30 seconds; x 60 seconds. no dizziness      X1 Viewing Vertical   Foot Position standing    Reps 2    Comments x 30 seconds; x 60 seconds. no dizziness            Progressed Gaze Stabilization to 60 seconds - Standing at Home with HEP. Patient denied need for new handout for gaze stabilization.     Balance Exercises - 12/17/21 0001       Balance Exercises: Standing   Standing Eyes Closed Narrow base of support (BOS);Wide (BOA);Foam/compliant surface;Head turns;3 reps;30 secs;Limitations    Standing Eyes Closed Limitations 3 x 30 seconds EC on airex; eyes closed and horizontal/vertical head turns x 10 reps each direction. Mild sway noted with horizontal > vertical. No dizziness but reports feeling on a boat.            Updated HEP Provided:  Feet Together (Compliant Surface) Varied Arm Positions - Eyes Closed    Stand on compliant surface: pillow/foam with feet together and arms out. Close eyes and visualize upright position. Hold 30 seconds. Repeat 2 times per session. Do 2 sessions per day.   Feet Apart, Head Motion on COMPLAINT SURFACE (FOAM/PILLOW) - Eyes Closed    With eyes closed and feet shoulder width apart, move head slowly, up and down x 10 reps. Then complete to the right and left x 10 reps. Repeat 1 times per session. Do 2 sessions per day.      PT Education - 12/17/21 0919     Education Details HEP Update    Person(s) Educated Patient    Methods Explanation    Comprehension Verbalized understanding               PT Short Term Goals - 12/06/21 0816       PT SHORT TERM GOAL #1   Title Pt will be independent with initial HEP for balance/vestibular (ALL STGs Due: 12/16/21)    Baseline no HEP established  Time 3    Period Weeks    Status New    Target Date 12/16/21      PT SHORT TERM GOAL #2   Title FGA TBA and LTG to be set as applicable    Baseline TBA    Time 3    Period Weeks    Status New      PT SHORT TERM GOAL #3   Title SOT TBA and LTG to be set as applicable    Baseline TBA;  completed on 12-05-21 - goal deferred as score is WNL's    Time 3    Period Weeks    Status Deferred               PT Long Term Goals - 12/06/21 8756       PT LONG TERM GOAL #1   Title Pt will be independent with final/progressive vestibular/balance HEP (ALL LTGs Due: 01/10/22)    Baseline no HEP established    Time 6    Period Weeks    Status New    Target Date 01/10/22      PT LONG TERM GOAL #2   Title LTG to be set for SOT    Baseline TBA : deferred on 12-05-21    Time 6    Period Weeks    Status Deferred      PT LONG TERM GOAL #3   Title LTG to be set for FGA    Baseline TBA    Time 6    Period Weeks    Status New      PT LONG TERM GOAL #4   Title Pt will improve all components of MSQ to </= 1/5 to demo improved activity tolernace for functional tasks    Baseline 2-3/5 (mod dizziness)    Time 6    Period Weeks    Status New                   Plan - 12/17/21 4332     Clinical Impression Statement Assessed patient's progress toward STGs. Patient able to meet all STGs at this time. With FGA, patient scoring 29/30, mild sway noted with horizontal head turns . Continued progression of HEP, adding in balance focused on head turns and eyes closed on complaint surfaces to further challenge vestibular input. Patient progressing well with no episodes of dizziness or migraines recently. Will reduce frequency to 1x/week for 3 weeks, and re-evaluate how patient is  doing.    Personal Factors and Comorbidities Comorbidity 2;Time since onset of injury/illness/exacerbation    Comorbidities Asthma, GERD, Gestational HTN, HA, Rhinitis    Examination-Activity Limitations Bend;Caring for Others;Locomotion Level    Examination-Participation Restrictions Occupation;Community Activity    Stability/Clinical Decision Making Stable/Uncomplicated    Rehab Potential Good    PT Frequency 2x / week    PT Duration 6 weeks    PT Treatment/Interventions ADLs/Self Care Home Management;Canalith Repostioning;Electrical Stimulation;DME Instruction;Moist Heat;Gait training;Stair training;Functional mobility training;Therapeutic activities;Therapeutic exercise;Balance training;Neuromuscular re-education;Manual techniques;Patient/family education;Vestibular;Dry needling    PT Next Visit Plan Review HEP and continue to progress balance/VOR. Rockerboard( I issued x1 viewing in standing on 2-16)Trial VOR and potential smooth pursuit/saccades due to symptoms.    Consulted and Agree with Plan of Care Patient             Patient will benefit from skilled therapeutic intervention in order to improve the following deficits and impairments:  Pain, Dizziness, Decreased activity tolerance, Decreased balance  Visit Diagnosis: Dizziness  and giddiness  Unsteadiness on feet     Problem List Patient Active Problem List   Diagnosis Date Noted   Vertigo 11/24/2021   Splenic artery aneurysm ([place]) 08/06/2021   Effusion of right ankle 07/31/2021   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   Iron deficiency anemia 11/15/2020   Slow transit constipation 11/15/2020   Microcytosis 11/15/2020   Arthralgia of right ankle 08/15/2020   Stiffness of right ankle joint 08/15/2020   Closed trimalleolar fracture of right ankle 05/21/2020   Gastro-esophageal reflux disease without esophagitis 04/13/2020   Morbid obesity ([place]) 01/20/2020   Vitamin D deficiency 01/20/2020   Prediabetes 01/20/2020    Insulin resistance [id number]   Metabolic syndrome [id number]   Allergic rhinitis 11/23/2016   Allergic conjunctivitis 01/17/2016   Mild intermittent asthma 01/17/2016   Dermatitis, eczematoid 06/21/2015   H/O cesarean section 06/21/2015   History of diabetes mellitus arising in pregnancy 06/21/2015   Headache, migraine 06/21/2015    Jones Bales, PT, DPT 12/17/2021, 9:28 AM  [facility] [address] [place], [place], [address] Phone: [phone]   Fax:  [phone]  Name: AZIE KARON MRN: [id number] Date of Birth: [birth year]

## 2021-12-17 NOTE — Patient Instructions (Signed)
Feet Together (Compliant Surface) Varied Arm Positions - Eyes Closed    Stand on compliant surface: pillow/foam with feet together and arms out. Close eyes and visualize upright position. Hold 30 seconds. Repeat 2 times per session. Do 2 sessions per day.   Feet Apart, Head Motion on COMPLAINT SURFACE (FOAM/PILLOW) - Eyes Closed    With eyes closed and feet shoulder width apart, move head slowly, up and down x 10 reps. Then complete to the right and left x 10 reps. Repeat 1 times per session. Do 2 sessions per day.

## 2021-12-18 ENCOUNTER — Encounter: Payer: 59 | Attending: General Surgery | Admitting: Skilled Nursing Facility1

## 2021-12-18 NOTE — Progress Notes (Signed)
Supervised Weight Loss Visit ?Bariatric Nutrition Education ? ?Planned Surgery: RYGB  ? ?1 out of 3 SWL Appointments  ? ?NUTRITION ASSESSMENT ? ?Anthropometrics  ?Start weight at [facility]: 226 lbs (date: 11/20/2021)  ?Weight: 228.6 ?Height: 62 in ?BMI: 41.81 kg/m2   ?  ?Clinical  ?Medical hx: vertigo, nausea, GERD ?Medications: see list  ?Labs: alkaline phosphatase 33, LDL 103, A1C 6.0 ?Notable signs/symptoms: monthly migraines  ?Any previous deficiencies? No ? ?Lifestyle & Dietary Hx ? ?Pt states she was able to drink 1 bottle of water per day.  ?Pt state she stopped drinking soda but eats chips instead stating she does this for a craving at the end of the day.  ?Pt states she is pretty sure she is perimenopausal.  ?Pt states all fruits give her reflux.  ? ?Estimated daily fluid intake: 36 oz ?Supplements:  ?Current average weekly physical activity: ADL's ? ?24-Hr Dietary Recall ?First Meal: coffee protein shake ?Snack: 2 eggs ?Second Meal: cubed steak rice and gravy and green beans ?Snack: chips or nuts ?Third Meal: fast food or can of tuna + mayo + saltines  ?Snack:  ?Beverages: soda, sweet tea, bottle of water ? ?Estimated Energy Needs ?Calories: 1500 ? ? ?NUTRITION DIAGNOSIS  ?Overweight/obesity ([place]-3.3) related to past poor dietary habits and physical inactivity as evidenced by patient w/ planned RYGB surgery following dietary guidelines for continued weight loss. ? ? ?NUTRITION INTERVENTION  ?Nutrition counseling (C-1) and education (E-2) to facilitate bariatric surgery goals. ? ?Pre-Op Goals Progress & New Goals ?Continue: Avoid all sugar-sweetened beverages (ex: regular soda, sports drinks): avoid soda ?Continue: Aim for 64-100 ounces of FLUID daily (with at least half of fluid intake being plain water) increase water ?NEW: have a nutritious snack toward the end of the day and a chat ?NEW: aim for 2 bottles of water per day ?NEW: takes bites the size of a dime and chew very well ? ?Handouts Provided Include   ? ? ?Learning Style & Readiness for Change ?Teaching method utilized: Visual & Auditory  ?Demonstrated degree of understanding via: Teach Back  ?Readiness Level: action ?Barriers to learning/adherence to lifestyle change: change is tough but getting there ? ?RD's Notes for next Visit  ?Assess pts adherence to chosen goals ? ? ?MONITORING & EVALUATION ?Dietary intake, weekly physical activity, body weight, and pre-op goals in 1 month.  ? ?Next Steps  ?Patient is to return to [facility] in 1 month ?

## 2021-12-20 ENCOUNTER — Ambulatory Visit: Payer: 59

## 2021-12-24 ENCOUNTER — Ambulatory Visit: Payer: 59

## 2021-12-25 ENCOUNTER — Ambulatory Visit: Payer: 59 | Attending: Family Medicine

## 2021-12-25 ENCOUNTER — Other Ambulatory Visit: Payer: Self-pay

## 2021-12-25 DIAGNOSIS — R2681 Unsteadiness on feet: Secondary | ICD-10-CM

## 2021-12-25 DIAGNOSIS — R42 Dizziness and giddiness: Secondary | ICD-10-CM

## 2021-12-25 NOTE — Patient Instructions (Signed)
Gaze Stabilization: Walking Toward Target ? ? ? ?Keeping eyes on target, walk toward target on wall 10 feet away, moving head up and down as you walk toward the target. Repeat with head tilted down 15-30?, moving head side to side. ?Do 2-3 sessions per day. ?

## 2021-12-25 NOTE — Therapy (Signed)
[facility] [address] [place], [place], [address] Phone: [phone]   Fax:  [phone]  Physical Therapy Treatment/Discharge Summary  Patient Details  Name: Sheri Malone MRN: [id number] Date of Birth: 01/15/1986 Referring Provider (PT): Sarina Ill, MD  PHYSICAL THERAPY DISCHARGE SUMMARY  Visits from Start of Care: 4  Current functional level related to goals / functional outcomes: See Clinical Impression Statement   Remaining deficits: None   Education / Equipment: HEP provided   Patient agrees to discharge. Patient goals were met. Patient is being discharged due to meeting the stated rehab goals.   Encounter Date: 12/25/2021   PT End of Session - 12/25/21 1018     Visit Number 4    Number of Visits 13    Date for PT Re-Evaluation 01/10/22    Authorization Type UHC    PT Start Time 1018   Pt arrived late   PT Stop Time 1038    PT Time Calculation (min) 20 min    Equipment Utilized During Treatment Other (comment)   harness vest used with SOT   Activity Tolerance Patient tolerated treatment well    Behavior During Therapy [facility] for tasks assessed/performed             Past Medical History:  Diagnosis Date   Abnormal Pap smear    Asthma    GERD (gastroesophageal reflux disease)    Gestational HTN    Headache(784.0)    History of ectopic pregnancy    History of gestational diabetes    Low hemoglobin    on ironspan   Rhinitis    Vaginal Pap smear, abnormal     Past Surgical History:  Procedure Laterality Date   CESAREAN SECTION N/A 05/20/2013   Procedure: CESAREAN SECTION;  Surgeon: Princess Bruins, MD;  Location: [facility];  Service: Obstetrics;  Laterality: N/A;  primary   CESAREAN SECTION N/A 02/04/2018   Procedure: Repeat CESAREAN SECTION;  Surgeon: Aloha Gell, MD;  Location: [place];  Service: Obstetrics;  Laterality: N/A;  EDD: 02/16/18   CHOLECYSTECTOMY N/A  09/28/2018   Procedure: LAPAROSCOPIC CHOLECYSTECTOMY;  Surgeon: Coralie Keens, MD;  Location: [facility];  Service: General;  Laterality: N/A;   [place] OF UTERUS  2009   SALPINGOOPHORECTOMY  2009   left   UNILATERAL SALPINGECTOMY Right 02/04/2018   Procedure: UNILATERAL SALPINGECTOMY;  Surgeon: Aloha Gell, MD;  Location: [place];  Service: Obstetrics;  Laterality: Right;   WISDOM TOOTH EXTRACTION      There were no vitals filed for this visit.   Subjective Assessment - 12/25/21 1019     Subjective Reports has been feeling very well. No episodes per patient reports. No other new changes/complaints. No Migraines, but has had a HA here and there.    Pertinent History Asthma, GERD, Gestational HTN, HA, Rhinitis    Limitations Standing;Walking    Diagnostic tests Normal MRI Brain    Patient Stated Goals Feel more stable on your feet    Currently in Pain? No/denies               Vestibular Assessment - 12/25/21 0001       Positional Sensitivities   Sit to Supine No dizziness    Supine to Left Side No dizziness    Supine to Right Side No dizziness    Supine to Sitting No dizziness    Right Hallpike No dizziness    Up from Right Hallpike No dizziness  Up from Left Hallpike No dizziness    Nose to Right Knee No dizziness    Right Knee to Sitting No dizziness    Nose to Left Knee No dizziness    Left Knee to Sitting No dizziness    Head Turning x 5 No dizziness    Head Nodding x 5 No dizziness    Pivot Right in Standing No dizziness    Pivot Left in Standing No dizziness    Rolling Right No dizziness    Rolling Left No dizziness               Vestibular Treatment/Exercise - 12/25/21 0001       Vestibular Treatment/Exercise   Vestibular Treatment Provided Gaze    Gaze Exercises X1 Viewing Horizontal;X1 Viewing Vertical;Comment;X2 Viewing Horizontal;X2 Viewing Vertical      X1 Viewing Horizontal   Foot Position Standing    Reps 1     Comments x 60 seconds      X1 Viewing Vertical   Foot Position Standing    Reps 1    Comments x 60 seconds      X2 Viewing Horizontal   Foot Position Seated    Reps 1     Comments x 30 seconds      X2 Viewing Vertical   Foot Position Seated    Reps 1    Comments x 30 seconds      Eye/Head Exercise Vertical   Comment Completed ambulation with VOR x 1 forward approx 10, completed horizontal 2 x 10'. Followed by vertical 2 x 10'. No significant dizziness reported. Updated HEP.             HEP Reviewed/Provided:  Gaze Stabilization: Standing Feet Apart    Feet shoulder width apart, keeping eyes on target on wall _4-5___ feet away, tilt head down 15-30 and move head side to side for _60___ seconds. Repeat while moving head up and down for __60__ seconds. Do _3___ sessions per day. Repeat using target on pattern background.  Gaze Stabilization: Walking Toward Target    Keeping eyes on target, walk toward target on wall 10 feet away, moving head up and down as you walk toward the target. Repeat with head tilted down 15-30, moving head side to side. Do 2-3 sessions per day.  Feet Together (Compliant Surface) Varied Arm Positions - Eyes Closed    Stand on compliant surface: pillow/foam with feet together and arms out. Close eyes and visualize upright position. Hold 30 seconds. Repeat 2 times per session. Do 2 sessions per day.    Feet Apart, Head Motion on COMPLAINT SURFACE (FOAM/PILLOW) - Eyes Closed    With eyes closed and feet shoulder width apart, move head slowly, up and down x 10 reps. Then complete to the right and left x 10 reps. Repeat 1 times per session. Do 2 sessions per day.      PT Education - 12/25/21 1042     Education Details HEP Review; Progress toward LTGs    Person(s) Educated Patient    Methods Explanation;Demonstration    Comprehension Verbalized understanding;Returned demonstration              PT Short Term Goals - 12/06/21  0816       PT SHORT TERM GOAL #1   Title Pt will be independent with initial HEP for balance/vestibular (ALL STGs Due: 12/16/21)    Baseline no HEP established    Time 3    Period Weeks  Status New    Target Date 12/16/21      PT SHORT TERM GOAL #2   Title FGA TBA and LTG to be set as applicable    Baseline TBA    Time 3    Period Weeks    Status New      PT SHORT TERM GOAL #3   Title SOT TBA and LTG to be set as applicable    Baseline TBA;  completed on 12-05-21 - goal deferred as score is WNL's    Time 3    Period Weeks    Status Deferred               PT Long Term Goals - 12/25/21 1023       PT LONG TERM GOAL #1   Title Pt will be independent with final/progressive vestibular/balance HEP (ALL LTGs Due: 01/10/22)    Baseline no HEP established; indepdent with final HEP    Time 6    Period Weeks    Status Achieved    Target Date 01/10/22      PT LONG TERM GOAL #2   Title LTG to be set for SOT    Baseline TBA : deferred on 12-05-21    Time 6    Period Weeks    Status Deferred      PT LONG TERM GOAL #3   Title LTG to be set for FGA    Baseline TBA; 29/30    Time 6    Period Weeks    Status Deferred      PT LONG TERM GOAL #4   Title Pt will improve all components of MSQ to </= 1/5 to demo improved activity tolernace for functional tasks    Baseline 2-3/5 (mod dizziness); 0/5 on all components of MSQ    Time 6    Period Weeks    Status Achieved                   Plan - 12/25/21 1043     Clinical Impression Statement Due to patient continue to have no recent episodes of dizziness demonstrating singificant improvements. Patient able to meet all LTGs. SOT and FGA balance goals deferred due to near perfect score on both outcome tests. Reviewed and progressed HEP at end of session, with patient tolerating well. Pt demo readiness to d/c from PT services today, with patient agreeable.    Personal Factors and Comorbidities Comorbidity 2;Time since  onset of injury/illness/exacerbation    Comorbidities Asthma, GERD, Gestational HTN, HA, Rhinitis    Examination-Activity Limitations Bend;Caring for Others;Locomotion Level    Examination-Participation Restrictions Occupation;Community Activity    Stability/Clinical Decision Making Stable/Uncomplicated    Rehab Potential Good    PT Frequency 2x / week    PT Duration 6 weeks    PT Treatment/Interventions ADLs/Self Care Home Management;Canalith Repostioning;Electrical Stimulation;DME Instruction;Moist Heat;Gait training;Stair training;Functional mobility training;Therapeutic activities;Therapeutic exercise;Balance training;Neuromuscular re-education;Manual techniques;Patient/family education;Vestibular;Dry needling    Consulted and Agree with Plan of Care Patient             Patient will benefit from skilled therapeutic intervention in order to improve the following deficits and impairments:  Pain, Dizziness, Decreased activity tolerance, Decreased balance  Visit Diagnosis: Dizziness and giddiness  Unsteadiness on feet     Problem List Patient Active Problem List   Diagnosis Date Noted   Vertigo 11/24/2021   Splenic artery aneurysm ([place]) 08/06/2021   Effusion of right ankle 07/31/2021   Traumatic arthritis of ankle  07/31/2021   Iron deficiency anemia 11/15/2020   Slow transit constipation 11/15/2020   Microcytosis 11/15/2020   Arthralgia of right ankle 08/15/2020   Stiffness of right ankle joint 08/15/2020   Closed trimalleolar fracture of right ankle 05/21/2020   Gastro-esophageal reflux disease without esophagitis 04/13/2020   Morbid obesity ([place]) 01/20/2020   Vitamin D deficiency 01/20/2020   Prediabetes 01/20/2020   Insulin resistance 56/38/9373   Metabolic syndrome 42/87/6811   Allergic rhinitis 11/23/2016   Allergic conjunctivitis 01/17/2016   Mild intermittent asthma 01/17/2016   Dermatitis, eczematoid 06/21/2015   H/O cesarean section 06/21/2015   History of  diabetes mellitus arising in pregnancy 06/21/2015   Headache, migraine 06/21/2015    Jones Bales, PT, DPT 12/25/2021, 10:47 AM  [facility] [address] [place], [place], [address] Phone: [phone]   Fax:  [phone]  Name: Sheri Malone MRN: [id number] Date of Birth: [birth year]

## 2021-12-26 ENCOUNTER — Encounter: Payer: 59 | Admitting: Physical Therapy

## 2021-12-31 ENCOUNTER — Other Ambulatory Visit: Payer: Self-pay | Admitting: Family Medicine

## 2021-12-31 ENCOUNTER — Ambulatory Visit: Payer: 59

## 2022-01-02 ENCOUNTER — Encounter: Payer: 59 | Admitting: Physical Therapy

## 2022-01-07 ENCOUNTER — Ambulatory Visit: Payer: 59

## 2022-01-09 ENCOUNTER — Ambulatory Visit: Payer: 59 | Admitting: Physical Therapy

## 2022-01-20 ENCOUNTER — Encounter: Payer: 59 | Attending: General Surgery | Admitting: Skilled Nursing Facility1

## 2022-01-20 DIAGNOSIS — Z713 Dietary counseling and surveillance: Secondary | ICD-10-CM | POA: Insufficient documentation

## 2022-01-20 NOTE — Progress Notes (Signed)
Supervised Weight Loss Visit ?Bariatric Nutrition Education ? ?Planned Surgery: RYGB  ? ?2 out of 3 [facility] Appointments  ? ?NUTRITION ASSESSMENT ? ?Anthropometrics  ?Start weight at [facility]: 226 lbs (date: 11/20/2021)  ?Weight: 233.2 ?Height: 62 in ?BMI: 42.65 kg/m2   ?  ?Clinical  ?Medical hx: vertigo, nausea, GERD ?Medications: see list  ?Labs: alkaline phosphatase 33, LDL 103, A1C 6.0 ?Notable signs/symptoms: monthly migraines  ?Any previous deficiencies? No ? ?Lifestyle & Dietary Hx ? ?Pt states she was able to drink 1 bottle of water per day.  ?Pt state she is still working on drinking more water and feels she is getting better ?Pt states she stopped drinking soda but eats chips instead stating she does this for a craving at the end of the day.  ?Pt states she is pretty sure she is perimenopausal.  ?Pt states all fruits give her reflux.  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  ?Pt states she has been working on waiting 20 minute after eating dinner intead of automically going back to seconds. Pt states her husband is getting more and more on board  ? ?Estimated daily fluid intake: 36 oz ?Supplements:  ?Current average weekly physical activity: ADL's ? ?24-Hr Dietary Recall ?First Meal 8:30-9: skipped or yogurt or fast food ?Snack 10-10:30: granola snack ?Second Meal 12-2: mexican meat and tortilla + rice + guacamole + sour cream  ?Snack: chips or nuts ?Third Meal: eaten out  ?Snack:  ?Beverages: soda, sweet tea, bottle of water ? ?Estimated Energy Needs ?Calories: 1500 ? ? ?NUTRITION DIAGNOSIS  ?Overweight/obesity ([place]-3.3) related to past poor dietary habits and physical inactivity as evidenced by patient w/ planned RYGB surgery following dietary guidelines for continued weight loss. ? ? ?NUTRITION INTERVENTION  ?Nutrition counseling (C-1) and education (E-2) to facilitate bariatric surgery goals. ? ? ?Pre-Op Goals Progress & New Goals ?Continue: Avoid all  sugar-sweetened beverages (ex: regular soda, sports drinks): avoid soda ?Continue: Aim for 64-100 ounces of FLUID daily (with at least half of fluid intake being plain water) increase water ?continue: have a nutritious snack toward the end of the day and a chat ?continue: aim for 2 bottles of water per day, try for 2.5 bottles  ?continue: takes bites the size of a dime and chew very well ?NEW: add large portions of non starchy vegetables to dinner and put away leftovers from dinner before you even start eating  ?NEW: avoid drinking with meals ? ?Handouts Provided Include  ? ? ?Learning Style & Readiness for Change ?Teaching method utilized: Visual & Auditory  ?Demonstrated degree of understanding via: Teach Back  ?Readiness Level: action ?Barriers to learning/adherence to lifestyle change: change is tough but getting there ? ?RD's Notes for next Visit  ?Assess pts adherence to chosen goals ? ? ?MONITORING & EVALUATION ?Dietary intake, weekly physical activity, body weight, and pre-op goals in 1 month.  ? ?Next Steps  ?Patient is to return to [facility] in 1 month ?

## 2022-02-03 NOTE — Patient Instructions (Signed)
Preventive Care 21-39 Years Old, Female ?Preventive care refers to lifestyle choices and visits with your health care provider that can promote health and wellness. Preventive care visits are also called wellness exams. ?What can I expect for my preventive care visit? ?Counseling ?During your preventive care visit, your health care provider may ask about your: ?Medical history, including: ?Past medical problems. ?Family medical history. ?Pregnancy history. ?Current health, including: ?Menstrual cycle. ?Method of birth control. ?Emotional well-being. ?Home life and relationship well-being. ?Sexual activity and sexual health. ?Lifestyle, including: ?Alcohol, nicotine or tobacco, and drug use. ?Access to firearms. ?Diet, exercise, and sleep habits. ?Work and work environment. ?Sunscreen use. ?Safety issues such as seatbelt and bike helmet use. ?Physical exam ?Your health care provider may check your: ?Height and weight. These may be used to calculate your BMI (body mass index). BMI is a measurement that tells if you are at a healthy weight. ?Waist circumference. This measures the distance around your waistline. This measurement also tells if you are at a healthy weight and may help predict your risk of certain diseases, such as type 2 diabetes and high blood pressure. ?Heart rate and blood pressure. ?Body temperature. ?Skin for abnormal spots. ?What immunizations do I need? ? ?Vaccines are usually given at various ages, according to a schedule. Your health care provider will recommend vaccines for you based on your age, medical history, and lifestyle or other factors, such as travel or where you work. ?What tests do I need? ?Screening ?Your health care provider may recommend screening tests for certain conditions. This may include: ?Pelvic exam and Pap test. ?Lipid and cholesterol levels. ?Diabetes screening. This is done by checking your blood sugar (glucose) after you have not eaten for a while (fasting). ?Hepatitis  B test. ?Hepatitis C test. ?HIV (human immunodeficiency virus) test. ?STI (sexually transmitted infection) testing, if you are at risk. ?BRCA-related cancer screening. This may be done if you have a family history of breast, ovarian, tubal, or peritoneal cancers. ?Talk with your health care provider about your test results, treatment options, and if necessary, the need for more tests. ?Follow these instructions at home: ?Eating and drinking ? ?Eat a healthy diet that includes fresh fruits and vegetables, whole grains, lean protein, and low-fat dairy products. ?Take vitamin and mineral supplements as recommended by your health care provider. ?Do not drink alcohol if: ?Your health care provider tells you not to drink. ?You are pregnant, may be pregnant, or are planning to become pregnant. ?If you drink alcohol: ?Limit how much you have to 0-1 drink a day. ?Know how much alcohol is in your drink. In the [place] one drink equals one 12 oz bottle of beer (355 mL), one 5 oz glass of wine (148 mL), or one 1? oz glass of hard liquor (44 mL). ?Lifestyle ?Brush your teeth every morning and night with fluoride toothpaste. Floss one time each day. ?Exercise for at least 30 minutes 5 or more days each week. ?Do not use any products that contain nicotine or tobacco. These products include cigarettes, chewing tobacco, and vaping devices, such as e-cigarettes. If you need help quitting, ask your health care provider. ?Do not use drugs. ?If you are sexually active, practice safe sex. Use a condom or other form of protection to prevent STIs. ?If you do not wish to become pregnant, use a form of birth control. If you plan to become pregnant, see your health care provider for a prepregnancy visit. ?Find healthy ways to manage stress, such as: ?Meditation,   yoga, or listening to music. ?Journaling. ?Talking to a trusted person. ?Spending time with friends and family. ?Minimize exposure to UV radiation to reduce your risk of skin  cancer. ?Safety ?Always wear your seat belt while driving or riding in a vehicle. ?Do not drive: ?If you have been drinking alcohol. Do not ride with someone who has been drinking. ?If you have been using any mind-altering substances or drugs. ?While texting. ?When you are tired or distracted. ?Wear a helmet and other protective equipment during sports activities. ?If you have firearms in your house, make sure you follow all gun safety procedures. ?Seek help if you have been physically or sexually abused. ?What's next? ?Go to your health care provider once a year for an annual wellness visit. ?Ask your health care provider how often you should have your eyes and teeth checked. ?Stay up to date on all vaccines. ?This information is not intended to replace advice given to you by your health care provider. Make sure you discuss any questions you have with your health care provider. ?Document Revised: 04/03/2021 Document Reviewed: 04/03/2021 ?Elsevier Patient Education ? [place]. ? ?

## 2022-02-04 ENCOUNTER — Ambulatory Visit (INDEPENDENT_AMBULATORY_CARE_PROVIDER_SITE_OTHER): Payer: 59 | Admitting: Family Medicine

## 2022-02-04 ENCOUNTER — Encounter: Payer: Self-pay | Admitting: Family Medicine

## 2022-02-04 VITALS — BP 118/80 | HR 100 | Temp 98.3°F | Resp 16 | Ht 63.0 in | Wt 226.6 lb

## 2022-02-04 DIAGNOSIS — D509 Iron deficiency anemia, unspecified: Secondary | ICD-10-CM

## 2022-02-04 DIAGNOSIS — R7303 Prediabetes: Secondary | ICD-10-CM

## 2022-02-04 DIAGNOSIS — Z0000 Encounter for general adult medical examination without abnormal findings: Secondary | ICD-10-CM

## 2022-02-04 DIAGNOSIS — E8881 Metabolic syndrome: Secondary | ICD-10-CM

## 2022-02-04 DIAGNOSIS — R718 Other abnormality of red blood cells: Secondary | ICD-10-CM

## 2022-02-04 DIAGNOSIS — N3946 Mixed incontinence: Secondary | ICD-10-CM

## 2022-02-04 DIAGNOSIS — Z8632 Personal history of gestational diabetes: Secondary | ICD-10-CM | POA: Diagnosis not present

## 2022-02-04 DIAGNOSIS — J4521 Mild intermittent asthma with (acute) exacerbation: Secondary | ICD-10-CM

## 2022-02-04 DIAGNOSIS — K219 Gastro-esophageal reflux disease without esophagitis: Secondary | ICD-10-CM | POA: Diagnosis not present

## 2022-02-04 NOTE — Progress Notes (Signed)
? ? ?Patient: Sheri Malone, Female    DOB: 27-Jan-1986, 36 y.o.   MRN: [id number] ?[name], PA-C ?Visit Date: 02/04/2022 ? ?Today's Provider: [name], PA-C  ? ?Chief Complaint  ?Patient presents with  ? Annual Exam  ? ?Subjective:  ? ?Annual physical exam: ? ?Sheri Malone is a 36 y.o. female who presents today for complete physical exam: ? ?Exercise/Activity:  3 d 30 min  ?Diet/nutrition:  motivated to loose weight and work on diet after healing from injury/change to baseline activity ?Sleep: no concerns ? ?SDOH Screenings  ? ?Alcohol Screen: Low Risk   ? Last Alcohol Screening Score (AUDIT): 0  ?Depression (PHQ2-9): Low Risk   ? PHQ-2 Score: 0  ?Financial Resource Strain: Low Risk   ? Difficulty of Paying Living Expenses: Not hard at all  ?Food Insecurity: No Food Insecurity  ? Worried About Charity fundraiser in the Last Year: Never true  ? Ran Out of Food in the Last Year: Never true  ?Housing: Low Risk   ? Last Housing Risk Score: 0  ?Physical Activity: Insufficiently Active  ? Days of Exercise per Week: 3 days  ? Minutes of Exercise per Session: 30 min  ?Social Connections: Moderately Integrated  ? Frequency of Communication with Friends and Family: Three times a week  ? Frequency of Social Gatherings with Friends and Family: Three times a week  ? Attends Religious Services: 1 to 4 times per year  ? Active Member of Clubs or Organizations: Yes  ? Attends Archivist Meetings: 1 to 4 times per year  ? Marital Status: Never married  ?Stress: No Stress Concern Present  ? Feeling of Stress : Only a little  ?Tobacco Use: Low Risk   ? Smoking Tobacco Use: Never  ? Smokeless Tobacco Use: Never  ? Passive Exposure: Not on file  ?Transportation Needs: Unmet Transportation Needs  ? Lack of Transportation (Medical): Yes  ? Lack of Transportation (Non-Medical): Yes  ? ? ? ?USPSTF grade A and B recommendations - reviewed and addressed today ? ?Depression:  ?Phq 9 completed today by patient, was  reviewed by me with patient in the room ?PHQ score is neg, pt feels feels good ? ?  02/04/2022  ?  8:25 AM 11/20/2021  ?  4:04 PM 10/28/2021  ? 10:42 AM 10/25/2021  ? 11:52 AM  ?PHQ 2/9 Scores  ?PHQ - 2 Score 0 0 0 0  ?PHQ- 9 Score 0     ? ? ?  02/04/2022  ?  8:25 AM 11/20/2021  ?  4:04 PM 10/28/2021  ? 10:42 AM 10/25/2021  ? 11:52 AM 09/20/2021  ?  1:37 PM  ?Depression screen PHQ 2/9  ?Decreased Interest 0 0 0 0 0  ?Down, Depressed, Hopeless 0 0 0 0 0  ?PHQ - 2 Score 0 0 0 0 0  ?Altered sleeping 0    0  ?Tired, decreased energy 0    0  ?Change in appetite 0    0  ?Feeling bad or failure about yourself  0    0  ?Trouble concentrating 0    0  ?Moving slowly or fidgety/restless 0    0  ?Suicidal thoughts 0    0  ?PHQ-9 Score 0    0  ?Difficult doing work/chores Not difficult at all    Not difficult at all  ? ? ?Alcohol screening: ?[place] Office Visit from [facility]/25/2021 [facility]  ?AUDIT-C Score 0  ? ?  ? ? ?  Immunizations and Health Maintenance: ?Health Maintenance  ?Topic Date Due  ? COVID-19 Vaccine (5 - Booster for Pfizer series) 04/26/2021  ? PAP SMEAR-Modifier  02/17/2022  ? INFLUENZA VACCINE  05/20/2022  ? TETANUS/TDAP  01/16/2026  ? Hepatitis C Screening  Completed  ? HIV Screening  Completed  ? HPV VACCINES  Aged Out  ?  ? ?Hep C Screening: done ? ?STD testing and prevention (HIV/chl/gon/syphilis):  see above, no additional testing desired by pt today ? ?Intimate partner violence: safe ? ?Sexual History/Pain during Intercourse: married  ? ?Menstrual History/LMP/Abnormal Bleeding: irregular cycles, monophasic june ?Patient's last menstrual period was 01/04/2022 (approximate). ? ?Incontinence Symptoms: yes ? ?Breast cancer:  mom with breast CA - pt concerned about it, she was diagnosed at age 58, 36 y/o  ?Last Mammogram: *see HM list above ?BRCA gene screening: none known ? ?Cervical cancer screening: due May 2023 ?Pt denies family hx of cancers - breast, ovarian, uterine, colon:    ? ?Osteoporosis:    ?Discussion on osteoporosis per age, including high calcium and vitamin D supplementation, weight bearing exercises ?Not applicable due to age ? ?Skin cancer:  Hx of skin CA -  NO  ?Discussed atypical lesions  ? ?Colorectal cancer:   ?Colonoscopy is not due per age ?Discussed concerning signs and sx of CRC, pt denies change in bowels blood in stool ? ?Lung cancer:   ?Low Dose CT Chest recommended if Age 42-80 years, 20 pack-year currently smoking OR have quit w/in 15years. Patient does not qualify.   ? ?Social History  ? ?Tobacco Use  ? Smoking status: Never  ? Smokeless tobacco: Never  ?Vaping Use  ? Vaping Use: Never used  ?Substance Use Topics  ? Alcohol use: Yes  ?  Comment: occassionally/once a month  ? Drug use: No  ?  ? ?[place] Office Visit from 10/25/2021 in [facility]  ?AUDIT-C Score 0  ? ?  ? ? ?Family History  ?Problem Relation Age of Onset  ? Migraines Mother   ? Hypertension Mother   ? Breast cancer Mother   ? Diabetes Father   ? Hypertension Father   ? Multiple sclerosis Sister   ? Cancer Maternal Grandfather   ?     prostate  ? Heart disease Paternal Grandfather   ? Stomach cancer Neg Hx   ? Rectal cancer Neg Hx   ? Esophageal cancer Neg Hx   ? Colon cancer Neg Hx   ?  ? ?Blood pressure/Hypertension: ?BP Readings from Last 3 Encounters:  ?02/04/22 118/80  ?11/22/21 117/85  ?10/25/21 118/72  ? ? ?Weight/Obesity: ?Wt Readings from Last 3 Encounters:  ?02/04/22 226 lb 9.6 oz (102.8 kg)  ?01/20/22 233 lb 3.2 oz (105.8 kg)  ?12/18/21 228 lb 9.6 oz (103.7 kg)  ? ?BMI Readings from Last 3 Encounters:  ?02/04/22 40.14 kg/m?  ?01/20/22 42.65 kg/m?  ?12/18/21 41.81 kg/m?  ?  ? ?Lipids:  ?Lab Results  ?Component Value Date  ? CHOL 138 01/27/2014  ? ?Lab Results  ?Component Value Date  ? HDL 42 01/27/2014  ? ?Lab Results  ?Component Value Date  ? [place] 58 01/27/2014  ? ?Lab Results  ?Component Value Date  ? TRIG 188 (A) 01/27/2014  ? ?No results found for: CHOLHDL ?No results found  for: LDLDIRECT ?Based on the results of lipid panel his/her cardiovascular risk factor ( using [facility] )  in the next 10 years is: ?The ASCVD Risk score (Arnett DK, et al., 2019)  failed to calculate for the following reasons: ?  The 2019 ASCVD risk score is only valid for ages 62 to 31 ? ?Glucose:  ?Glucose  ?Date Value Ref Range Status  ?07/19/2021 110 (H) 70 - 99 mg/dL Final  ?  Comment:  ?                **Please note reference interval change**  ? ?Glucose, Bld  ?Date Value Ref Range Status  ?09/20/2021 118 (H) 65 - 99 mg/dL Final  ?  Comment:  ?  . ?           Fasting reference interval ?. ?For someone without known diabetes, a glucose value ?between 100 and 125 mg/dL is consistent with ?prediabetes and should be confirmed with a ?follow-up test. ?. ?  ?11/15/2020 95 65 - 99 mg/dL Final  ?  Comment:  ?  . ?           Fasting reference interval ?. ?  ?04/13/2020 87 65 - 99 mg/dL Final  ?  Comment:  ?  . ?           Fasting reference interval ?. ?  ? ? ?Advanced Care Planning:  ?A voluntary discussion about advance care planning including the explanation and discussion of advance directives.   ?Discussed health care proxy and Living will, and the patient was able to identify a health care proxy as [employer].   ?Patient does not have a living will at present time.  ? ?Social History ?      ?Social History  ? ?Socioeconomic History  ? Marital status: Single  ?  Spouse name: Cephus Shelling  ? Number of children: 2  ? Years of education: Not on file  ? Highest education level: Master's degree (e.g., MA, MS, MEng, MEd, MSW, MBA)  ?Occupational History  ? Not on file  ?Tobacco Use  ? Smoking status: Never  ? Smokeless tobacco: Never  ?Vaping Use  ? Vaping Use: Never used  ?Substance and Sexual Activity  ? Alcohol use: Yes  ?  Comment: occassionally/once a month  ? Drug use: No  ? Sexual activity: Yes  ?  Partners: Male  ?Other Topics Concern  ? Not on file  ?Social History Narrative  ? ** Merged History Encounter ** Lives  at home with husband and daughter and son.  ? Right handed  ? Drinks 2 cups of caffeine daily  ? ?Social Determinants of Health  ? ?Financial Resource Strain: Low Risk   ? Difficulty of Paying Living Expenses

## 2022-02-06 ENCOUNTER — Ambulatory Visit: Payer: 59 | Attending: Family Medicine

## 2022-02-06 DIAGNOSIS — M6281 Muscle weakness (generalized): Secondary | ICD-10-CM | POA: Insufficient documentation

## 2022-02-06 DIAGNOSIS — N3946 Mixed incontinence: Secondary | ICD-10-CM | POA: Diagnosis present

## 2022-02-06 DIAGNOSIS — R279 Unspecified lack of coordination: Secondary | ICD-10-CM | POA: Insufficient documentation

## 2022-02-06 NOTE — Therapy (Signed)
?OUTPATIENT PHYSICAL THERAPY FEMALE PELVIC EVALUATION ? ? ?Patient Name: Sheri Malone ?MRN: [id number] ?DOB:Sep 29, 1986, 36 y.o., female ?Today's Date: 02/06/2022 ? ? PT End of Session - 02/06/22 1032   ? ? Visit Number 1   ? Date for PT Re-Evaluation 05/01/22   ? Authorization Type UHC   ? PT Start Time 1020   ? PT Stop Time 1052   ? PT Time Calculation (min) 32 min   ? Activity Tolerance Patient tolerated treatment well   ? Behavior During Therapy [facility] for tasks assessed/performed   ? ?  ?  ? ?  ? ? ?Past Medical History:  ?Diagnosis Date  ? Abnormal Pap smear   ? Asthma   ? GERD (gastroesophageal reflux disease)   ? Gestational HTN   ? Headache(784.0)   ? History of ectopic pregnancy   ? History of gestational diabetes   ? Low hemoglobin   ? on ironspan  ? Rhinitis   ? Vaginal Pap smear, abnormal   ? ?Past Surgical History:  ?Procedure Laterality Date  ? CESAREAN SECTION N/A 05/20/2013  ? Procedure: CESAREAN SECTION;  Surgeon: Genia Del, MD;  Location: [facility];  Service: Obstetrics;  Laterality: N/A;  primary  ? CESAREAN SECTION N/A 02/04/2018  ? Procedure: Repeat CESAREAN SECTION;  Surgeon: Noland Fordyce, MD;  Location: [facility] BIRTHING SUITES;  Service: Obstetrics;  Laterality: N/A;  EDD: 02/16/18  ? CHOLECYSTECTOMY N/A 09/28/2018  ? Procedure: LAPAROSCOPIC CHOLECYSTECTOMY;  Surgeon: Abigail Miyamoto, MD;  Location: [facility] OR;  Service: General;  Laterality: N/A;  ? DILATION AND CURETTAGE OF UTERUS  2009  ? SALPINGOOPHORECTOMY  2009  ? left  ? UNILATERAL SALPINGECTOMY Right 02/04/2018  ? Procedure: UNILATERAL SALPINGECTOMY;  Surgeon: Noland Fordyce, MD;  Location: [facility] BIRTHING SUITES;  Service: Obstetrics;  Laterality: Right;  ? WISDOM TOOTH EXTRACTION    ? ?Patient Active Problem List  ? Diagnosis Date Noted  ? Vertigo 11/24/2021  ? Splenic artery aneurysm (HCC) 08/06/2021  ? Effusion of right ankle 07/31/2021  ? 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  ? Iron deficiency anemia 11/15/2020  ? Slow transit  constipation 11/15/2020  ? Microcytosis 11/15/2020  ? Arthralgia of right ankle 08/15/2020  ? Stiffness of right ankle joint 08/15/2020  ? Closed trimalleolar fracture of right ankle 05/21/2020  ? Gastro-esophageal reflux disease without esophagitis 04/13/2020  ? Morbid obesity (HCC) 01/20/2020  ? Vitamin D deficiency 01/20/2020  ? Prediabetes 01/20/2020  ? Insulin resistance 01/20/2020  ? Metabolic syndrome 01/20/2020  ? Allergic rhinitis 11/23/2016  ? Allergic conjunctivitis 01/17/2016  ? Mild intermittent asthma 01/17/2016  ? Dermatitis, eczematoid 06/21/2015  ? H/O cesarean section 06/21/2015  ? History of diabetes mellitus arising in pregnancy 06/21/2015  ? Headache, migraine 06/21/2015  ? ? ?PCP: Danelle Berry, PA-C ? ?REFERRING PROVIDER: Danelle Berry, PA-C ? ?REFERRING DIAG: N39.46 (ICD-10-CM) - Mixed incontinence urge and stress ? ?THERAPY DIAG:  ?Muscle weakness (generalized) ? ?Unspecified lack of coordination ? ?ONSET DATE: 10/20/2017 ? ?SUBJECTIVE:                                                                                                                                                                                          ? ?  SUBJECTIVE STATEMENT: ?Pt states that she has two children and after second delivery she has had urgency and stress urinary incontinence. She states that issues are much worse when she is standing up. She states that she's not getting any worse, but issues are also not getting any better.  ?Fluid intake: Yes: 2 bottles of water a day   ? ?Patient confirms identification and approves PT to assess pelvic floor and treatment Yes ? ? ?PAIN:  ?Are you having pain? No ? ?PRECAUTIONS: None ? ?WEIGHT BEARING RESTRICTIONS No ? ?FALLS:  ?Has patient fallen in last 6 months? No ? ?LIVING ENVIRONMENT: ?Lives with: lives with their family ?Lives in: House/apartment ? ?OCCUPATION: [place] [employer] ? ?PLOF: Independent ? ?PATIENT GOALS Decrease leaking ? ?PERTINENT HISTORY:  ?2  cesareans ?Sexual abuse: No ? ?BOWEL MOVEMENT ?Pain with bowel movement: No ?Type of bowel movement:Frequency every other day and Strain No ?Fully empty rectum: Yes: - ?Leakage: No ?Pads: No ?Fiber supplement: No ? ?URINATION ?Pain with urination: No ?Fully empty bladder: No - post-void dribbling and return to bathroom 10-15 minutes after she urinates ?Stream:  normal ?Urgency: Yes: not always, she;ll sit for long periods of time and ignore sensation to go and then get into emergency ?Frequency: she can go 2 times during work day, but then 2-3x at night ?Leakage: Urge to void, Coughing, Sneezing, and Laughing ?Pads: No ? ?INTERCOURSE ?Pain with intercourse:  none ?Ability to have vaginal penetration:  Yes: - ?Climax: yes ? ?PREGNANCY ?Vaginal deliveries 0 ?Tearing No ?C-section deliveries 2 ?Currently pregnant No ? ? ? ? ?OBJECTIVE:  ? ? ?COGNITION: ? Overall cognitive status: Within functional limits for tasks assessed   ?  ?SENSATION: ? Light touch: Appears intact ? Proprioception: Appears intact ? ?GAIT: WNL ? ? ?PELVIC MMT: ?2/5 strength, poor coordination with significant abdominal contraction/bracing, endurance 4 seconds, 10 repetitions switching more to abdomen than pelvic floor throughout  ? ?      PALPATION: ?  General  No abdominal tenderness/restriction ? ?              External Perineal Exam WNL ?              ?              Internal Pelvic Floor no tenderness ? ?TONE: ?Moderate tone ? ?PROLAPSE: ?Small amount of anterior vaginal wall laxity ? ?TODAY'S TREATMENT 02/06/22: ?EVAL  ?Neuromuscular re-education: ?Pelvic floor contraction training: ?Quick flicks 2 x 10 with multi modal cues and breath coordination ?Long holds 5 x 10 sec holds ?Down training: ?Self-care: ?Double voiding ?The knack ?Pelvic floor anatomy ?Voiding schedule every 3 hours ? ? ? ?PATIENT EDUCATION:  ?Education details: See above self-care ?Person educated: Patient ?Education method: Explanation, Demonstration, Tactile cues, Verbal  cues, and Handouts ?Education comprehension: verbalized understanding ? ? ?HOME EXERCISE PROGRAM: ?[id number] ? ?ASSESSMENT: ? ?CLINICAL IMPRESSION: ?Patient is a 36 y.o. female who was seen today for physical therapy evaluation and treatment for stress and urge urinary incontinence. Exam findings notable for upper abdominal coning/weakness with sit-up test, pelvic floor weakness 2/5, decreased pelvic floor coordination and inability to contract pelvic floor without abdominals, decreased pelvic floor endurance 4 seconds, and moderate pelvic floor tone. Signs and symptoms are most consistent with pelvic floor/core weakness and poor bladder habits. Initial treatment consisted of voiding schedule, double-voiding, the knack, pelvic floor contraction training, and endurance holds. She will benefit from skilled PT intervention in order to address impairments and decrease  urinary incontinence.  ? ? ?OBJECTIVE IMPAIRMENTS decreased activity tolerance, decreased coordination, decreased endurance, decreased strength, and increased fascial restrictions.  ? ?ACTIVITY LIMITATIONS  work and situations that increase abdominal pressure .  ? ?PERSONAL FACTORS 1 comorbidity: 2 cesareans  are also affecting patient's functional outcome.  ? ? ?REHAB POTENTIAL: Good ? ?CLINICAL DECISION MAKING: Stable/uncomplicated ? ?EVALUATION COMPLEXITY: Low ? ? ?GOALS: ?Goals reviewed with patient? Yes ? ?SHORT TERM GOALS: Target date: 03/06/2022 ? ?Pt will be independent with HEP.  ? ?Baseline: ?Goal status: INITIAL ? ?2.  Pt will be independent with the knack, urge suppression technique, and double voiding in order to improve bladder habits and decrease urinary incontinence.  ? ?Baseline:  ?Goal status: INITIAL ? ? ?LONG TERM GOALS: Target date: 05/01/2022 ? ?Pt will be independent with advanced HEP.  ? ?Baseline:  ?Goal status: INITIAL ? ?2.  Pt will demonstrate normal pelvic floor muscle tone and A/ROM, able to achieve 4/5 strength with contractions  and 10 sec endurance, in order to provide appropriate lumbopelvic support in functional activities.  ? ?Baseline:  ?Goal status: INITIAL ? ?3.  Pt will report no episodes of urinary incontinence in order to improve

## 2022-02-06 NOTE — Patient Instructions (Addendum)
Double-voiding: ? ?This technique is to help with post-void dribbling, or leaking a little bit when ?you stand up right after urinating. ? ?Use relaxed toileting mechanics to urinate as much as you feel like you ?have to without straining. ? ?Sit back upright from leaning forward and relax this way for 10-20 seconds. ? ?Lean forward again to finish voiding any amount more. ? ? ?The knack: ?Use this technique while coughing, laughing, sneezing, or with any activities ?that causes you to leak urine a little. ?Right before you perform one of these activities that increase pressure in ?the abdomen and pushes a little urine out, perform a pelvic floor muscle ?contraction and hold. ?If that does not completely stop the leaking, try tightening your thighs ?together in addition to performing a pelvic floor muscle contraction. ?Make sure you are not trying to stifle a cough, sneeze, or laugh; allow these ?activities in full as it will cause less pressure down into the bladder and ?pelvic floor muscles. ? ?

## 2022-02-10 ENCOUNTER — Encounter: Payer: Self-pay | Admitting: Family Medicine

## 2022-02-17 ENCOUNTER — Encounter: Payer: Self-pay | Admitting: Skilled Nursing Facility1

## 2022-02-17 ENCOUNTER — Encounter: Payer: 59 | Attending: General Surgery | Admitting: Skilled Nursing Facility1

## 2022-02-17 DIAGNOSIS — E669 Obesity, unspecified: Secondary | ICD-10-CM | POA: Insufficient documentation

## 2022-02-17 NOTE — Progress Notes (Signed)
Supervised Weight Loss Visit ?Bariatric Nutrition Education ? ?Planned Surgery: RYGB  ? ?Pt completed visits. ?  ?Pt has cleared nutrition requirements.  ? ?3 out of 3 SWL Appointments  ? ?NUTRITION ASSESSMENT ? ?Anthropometrics  ?Start weight at [facility]: 226 lbs (date: 11/20/2021)  ?Weight: 227 ?Height: 62 in ?BMI: 40.21 kg/m2   ?  ?Clinical  ?Medical hx: vertigo, nausea, GERD ?Medications: see list  ?Labs: alkaline phosphatase 33, LDL 103, A1C 6.0 ?Notable signs/symptoms: monthly migraines  ?Any previous deficiencies? No ? ?Lifestyle & Dietary Hx ? ?Pt states after surgery she will not be able to eat as much and states she should be okay with that mentally, and accepting that.  Pt states she is trying not to shut myself out from my family, but participate with them without eating less and enjoying what she can. ? ?Pt states her mom is staying with her and likes vegetables.  Stating she will help motivate pt to eat more vegetables. ? ?Pt states she has learned over the past visits, the importance of drinking more water, choosing healthier snacks, and making mindful choices. ? ?Pt states she will focus on being mindful after surgery.  She also states she will focus on chewing more, and small portion sizes. ?Pt states she is fearful of being nauseous and throwing up after surgery. ? ?Estimated daily fluid intake: 36 oz ?Supplements:  ?Current average weekly physical activity: ADL's ? ?24-Hr Dietary Recall ?First Meal 8:30-9: breakfast sandwich ([place]) ?Snack 10-10:30: granola snack ?Second Meal 12-2: soup and salad  ?Snack: coconut cluster ?Third Meal: [employer] ?Snack:  ?Beverages: soda, sweet tea, bottle of water ? ?Estimated Energy Needs ?Calories: 1500 ? ? ?NUTRITION DIAGNOSIS  ?Overweight/obesity ([place]-3.3) related to past poor dietary habits and physical inactivity as evidenced by patient w/ planned RYGB surgery following dietary guidelines for continued weight loss. ? ? ?NUTRITION INTERVENTION  ?Nutrition  counseling (C-1) and education (E-2) to facilitate bariatric surgery goals. ? ? ?Pre-Op Goals Progress & New Goals ?Continue: Avoid all sugar-sweetened beverages (ex: regular soda, sports drinks): avoid soda ?Continue: Aim for 64-100 ounces of FLUID daily (with at least half of fluid intake being plain water) increase water ?continue: have a nutritious snack toward the end of the day and a chat ?continue: aim for 2 bottles of water per day, try for 2.5 bottles  ?continue: takes bites the size of a dime and chew very well ?continue: add large portions of non starchy vegetables to dinner and put away leftovers from dinner before you even start eating  ?continue: avoid drinking with meals ? ?Handouts Provided Include  ? ? ?Learning Style & Readiness for Change ?Teaching method utilized: Visual & Auditory  ?Demonstrated degree of understanding via: Teach Back  ?Readiness Level: action ?Barriers to learning/adherence to lifestyle change: change is tough but getting there ? ?RD's Notes for next Visit  ?Assess pts adherence to chosen goals ? ? ?MONITORING & EVALUATION ?Dietary intake, weekly physical activity, body weight ? ?Next Steps  ?Pt has completed visits. No further supervised visits required/recomended  ?Patient is to return to [facility] for pre-op class ?

## 2022-02-19 ENCOUNTER — Ambulatory Visit: Payer: 59 | Attending: Family Medicine

## 2022-02-19 DIAGNOSIS — M6281 Muscle weakness (generalized): Secondary | ICD-10-CM | POA: Diagnosis present

## 2022-02-19 DIAGNOSIS — R279 Unspecified lack of coordination: Secondary | ICD-10-CM | POA: Insufficient documentation

## 2022-02-19 NOTE — Patient Instructions (Signed)
Urge suppression technique: ?A technique to help you hold urine until it?s an appropriate time to go, ?whether this is making it home or trying to reach a specific voiding time ?frame according to your schedule. ?It helps to send signals from the bladder to the brain that say you don?t ?actually have to void urine right now. ?This most likely only give you several minutes of relief at first, but repeat as ?needed; the benefit will last longer as you use this technique more and get ?into better bladder habits. ??  ?The technique: ?o Perform 5 quick flicks (Kegels) rapidly, not worrying about fully ?relaxing in between each (only in this technique). ?o Then perform several deep belly breaths while focusing on relaxing ?the pelvic floor. ?o Go do something else to help distract yourself from the urge to ?urinate. ?o Repeat as needed. ? ?

## 2022-02-19 NOTE — Therapy (Signed)
?OUTPATIENT PHYSICAL THERAPY TREATMENT NOTE ? ? ?Patient Name: Sheri Malone ?MRN: [id number] ?DOB:12-27-1985, 36 y.o., female ?Today's Date: 02/19/2022 ? ?PCP: Debbe Odea, MD ?REFERRING PROVIDER: Danelle Berry, PA-C ? ?END OF SESSION:  ? PT End of Session - 02/19/22 0803   ? ? Visit Number 2   ? Date for PT Re-Evaluation 05/01/22   ? Authorization Type UHC   ? PT Start Time 0800   ? PT Stop Time [phone]   ? PT Time Calculation (min) 38 min   ? Activity Tolerance Patient tolerated treatment well   ? Behavior During Therapy [facility] for tasks assessed/performed   ? ?  ?  ? ?  ? ? ?Past Medical History:  ?Diagnosis Date  ? Abnormal Pap smear   ? Asthma   ? GERD (gastroesophageal reflux disease)   ? Gestational HTN   ? Headache(784.0)   ? History of ectopic pregnancy   ? History of gestational diabetes   ? Low hemoglobin   ? on ironspan  ? Rhinitis   ? Vaginal Pap smear, abnormal   ? ?Past Surgical History:  ?Procedure Laterality Date  ? CESAREAN SECTION N/A 05/20/2013  ? Procedure: CESAREAN SECTION;  Surgeon: Genia Del, MD;  Location: [facility];  Service: Obstetrics;  Laterality: N/A;  primary  ? CESAREAN SECTION N/A 02/04/2018  ? Procedure: Repeat CESAREAN SECTION;  Surgeon: Noland Fordyce, MD;  Location: [facility] BIRTHING SUITES;  Service: Obstetrics;  Laterality: N/A;  EDD: 02/16/18  ? CHOLECYSTECTOMY N/A 09/28/2018  ? Procedure: LAPAROSCOPIC CHOLECYSTECTOMY;  Surgeon: Abigail Miyamoto, MD;  Location: [facility] OR;  Service: General;  Laterality: N/A;  ? DILATION AND CURETTAGE OF UTERUS  2009  ? SALPINGOOPHORECTOMY  2009  ? left  ? UNILATERAL SALPINGECTOMY Right 02/04/2018  ? Procedure: UNILATERAL SALPINGECTOMY;  Surgeon: Noland Fordyce, MD;  Location: [facility] BIRTHING SUITES;  Service: Obstetrics;  Laterality: Right;  ? WISDOM TOOTH EXTRACTION    ? ?Patient Active Problem List  ? Diagnosis Date Noted  ? Vertigo 11/24/2021  ? Splenic artery aneurysm (HCC) 08/06/2021  ? Effusion of right ankle 07/31/2021  ? Traumatic arthritis of  ankle 07/31/2021  ? Iron deficiency anemia 11/15/2020  ? Slow transit constipation 11/15/2020  ? Microcytosis 11/15/2020  ? Arthralgia of right ankle 08/15/2020  ? Stiffness of right ankle joint 08/15/2020  ? Closed trimalleolar fracture of right ankle 05/21/2020  ? Gastro-esophageal reflux disease without esophagitis 04/13/2020  ? Morbid obesity (HCC) 01/20/2020  ? Vitamin D deficiency 01/20/2020  ? Prediabetes 01/20/2020  ? Insulin resistance 01/20/2020  ? Metabolic syndrome 01/20/2020  ? Allergic rhinitis 11/23/2016  ? Allergic conjunctivitis 01/17/2016  ? Mild intermittent asthma 01/17/2016  ? Dermatitis, eczematoid 06/21/2015  ? H/O cesarean section 06/21/2015  ? History of diabetes mellitus arising in pregnancy 06/21/2015  ? Headache, migraine 06/21/2015  ? ? ?REFERRING DIAG: N39.46 (ICD-10-CM) - Mixed incontinence urge and stress ? ?THERAPY DIAG:  ?Muscle weakness (generalized) ? ?Unspecified lack of coordination ? ?PERTINENT HISTORY: 2 c/s ? ?PRECAUTIONS: NA ? ?SUBJECTIVE: Pt states that she feels like she can control the leakage when she gets the strong urge to go now. Double voiding and not straining to go to the bathroom have been helpful. She is working on voiding more frequently, trying to stick to every 3 hours. She has been performing contractions and feels like they are beginning to get stronger.  ? ?SUBJECTIVE STATEMENT 02/06/22: ?Pt states that she has two children and after second delivery she has had urgency and stress  urinary incontinence. She states that issues are much worse when she is standing up. She states that she's not getting any worse, but issues are also not getting any better.  ?Fluid intake: Yes: 2 bottles of water a day   ?  ?Patient confirms identification and approves PT to assess pelvic floor and treatment Yes ?  ?  ? ?  ?PATIENT GOALS Decrease leaking ?  ?PERTINENT HISTORY:  ?2 cesareans ?Sexual abuse: No ?  ?BOWEL MOVEMENT ?Pain with bowel movement: No ?Type of bowel  movement:Frequency every other day and Strain No ?Fully empty rectum: Yes: - ?Leakage: No ?Pads: No ?Fiber supplement: No ?  ?URINATION ?Pain with urination: No ?Fully empty bladder: No - post-void dribbling and return to bathroom 10-15 minutes after she urinates ?Stream:  normal ?Urgency: Yes: not always, she;ll sit for long periods of time and ignore sensation to go and then get into emergency ?Frequency: she can go 2 times during work day, but then 2-3x at night ?Leakage: Urge to void, Coughing, Sneezing, and Laughing ?Pads: No ?  ?INTERCOURSE ?Pain with intercourse:  none ?Ability to have vaginal penetration:  Yes: - ?Climax: yes ?  ?PREGNANCY ?Vaginal deliveries 0 ?Tearing No ?C-section deliveries 2 ?Currently pregnant No ?  ?  ?  ?  ?OBJECTIVE:  ?  ?  ?COGNITION: ?           Overall cognitive status: Within functional limits for tasks assessed              ?            ?SENSATION: ?           Light touch: Appears intact ?           Proprioception: Appears intact ?  ?GAIT: WNL ?  ?  ?PELVIC MMT: ?2/5 strength, poor coordination with significant abdominal contraction/bracing, endurance 4 seconds, 10 repetitions switching more to abdomen than pelvic floor throughout  ?  ?      PALPATION: ?  General  No abdominal tenderness/restriction ?  ?              External Perineal Exam WNL ?              ?              Internal Pelvic Floor no tenderness ?  ?TONE: ?Moderate tone ?  ?PROLAPSE: ?Small amount of anterior vaginal wall laxity ?  ?TODAY'S TREATMENT 02/19/22: ?Manual: ?Soft tissue mobilization: ?Scar tissue mobilization: ?Myofascial release: ?Spinal mobilization: ?Internal pelvic floor techniques: ?Dry needling: ?Neuromuscular re-education: ?Core retraining:  ?Transversus abdominus training in supine/seated with multimodal cues ?Core facilitation: ?Supine march 2 x 10 ?Supine leg extensions 10x bil ?Seated horizontal abduction pull down 2 x 10 green band ?Reclining rotation 10x bil ?Form correction: ?Pelvic floor  contraction training: ?Regular stance quick flicks 10x ?Wide stance quick flicks 10x ?Staggered stance quick flicks 10x bil ?Down training: ?Exercises: ?Stretches/mobility: ?Strengthening: ?Therapeutic activities: ?Functional strengthening activities: ?Self-care: ?Urge suppression technique ? ? ?TREATMENT 02/06/22: ?EVAL  ?Neuromuscular re-education: ?Pelvic floor contraction training: ?Quick flicks 2 x 10 with multi modal cues and breath coordination ?Long holds 5 x 10 sec holds ?Down training: ?Self-care: ?Double voiding ?The knack ?Pelvic floor anatomy ?Voiding schedule every 3 hours ?  ?  ?  ?PATIENT EDUCATION:  ?Education details: See above self-care ?Person educated: Patient ?Education method: Explanation, Demonstration, Tactile cues, Verbal cues, and Handouts ?Education comprehension: verbalized understanding ?  ?  ?HOME EXERCISE  PROGRAM: ?[id number] ?  ?ASSESSMENT: ?  ?CLINICAL IMPRESSION: ?Pt overall doing very well demonstrated by progress after initial visit, feeling like she has more control over leaking. We did discuss urge suppression technique and she was encouraged to incorporate this into routine as well. She reported more difficulty with pelvic floor contractions in standing, but did well and could still feel good A/ROM. She was able to start core training with and incorporate core and pelvic floor contractions with breath coordination into strengthening activities with minimal correction/cuing. She will continue to benefit from skilled PT intervention in order to address impairments and decrease urinary incontinence.  ?  ?  ?OBJECTIVE IMPAIRMENTS decreased activity tolerance, decreased coordination, decreased endurance, decreased strength, and increased fascial restrictions.  ?  ?ACTIVITY LIMITATIONS  work and situations that increase abdominal pressure .  ?  ?PERSONAL FACTORS 1 comorbidity: 2 cesareans  are also affecting patient's functional outcome.  ?  ?  ?REHAB POTENTIAL: Good ?  ?CLINICAL  DECISION MAKING: Stable/uncomplicated ?  ?EVALUATION COMPLEXITY: Low ?  ?  ?GOALS: ?Goals reviewed with patient? Yes ?  ?SHORT TERM GOALS: Target date: 03/06/2022 ?  ?Pt will be independent with HEP.  ?  ?Baseline: ?Goal

## 2022-02-21 ENCOUNTER — Other Ambulatory Visit: Payer: Self-pay | Admitting: Nurse Practitioner

## 2022-02-21 DIAGNOSIS — I728 Aneurysm of other specified arteries: Secondary | ICD-10-CM

## 2022-02-25 ENCOUNTER — Ambulatory Visit: Payer: 59

## 2022-02-25 DIAGNOSIS — R279 Unspecified lack of coordination: Secondary | ICD-10-CM

## 2022-02-25 DIAGNOSIS — M6281 Muscle weakness (generalized): Secondary | ICD-10-CM

## 2022-02-25 NOTE — Therapy (Signed)
?OUTPATIENT PHYSICAL THERAPY TREATMENT NOTE ? ? ?Patient Name: Sheri Malone ?MRN: [id number] ?DOB:26-Jun-1986, 36 y.o., female ?Today's Date: 02/25/2022 ? ?PCP: Debbe OdeaAgbor-Etang, Brian, MD ?REFERRING PROVIDER: Danelle Berryapia, Leisa, PA-C ? ?END OF SESSION:  ? PT End of Session - 02/25/22 0931   ? ? Visit Number 3   ? Date for PT Re-Evaluation 05/01/22   ? Authorization Type [employer]   ? PT Start Time [phone]   ? PT Stop Time 1010   ? PT Time Calculation (min) 39 min   ? Activity Tolerance Patient tolerated treatment well   ? [facility] for tasks assessed/performed   ? ?  ?  ? ?  ? ? ? ?Past Medical History:  ?Diagnosis Date  ? Abnormal Pap smear   ? Asthma   ? GERD (gastroesophageal reflux disease)   ? Gestational HTN   ? Headache(784.0)   ? History of ectopic pregnancy   ? History of gestational diabetes   ? Low hemoglobin   ? on ironspan  ? Rhinitis   ? Vaginal Pap smear, abnormal   ? ?Past Surgical History:  ?Procedure Laterality Date  ? CESAREAN SECTION N/A 05/20/2013  ? Procedure: CESAREAN SECTION;  Surgeon: Genia DelMarie-Lyne Lavoie, MD;  Location: [facility];  Service: Obstetrics;  Laterality: N/A;  primary  ? CESAREAN SECTION N/A 02/04/2018  ? Procedure: Repeat CESAREAN SECTION;  Surgeon: Noland FordyceFogleman, Kelly, MD;  [facility];  Service: Obstetrics;  Laterality: N/A;  EDD: 02/16/18  ? CHOLECYSTECTOMY N/A 09/28/2018  ? Procedure: LAPAROSCOPIC CHOLECYSTECTOMY;  Surgeon: Abigail MiyamotoBlackman, Douglas, MD;  [facility];  Service: General;  Laterality: N/A;  ? DILATION AND CURETTAGE OF UTERUS  2009  ? SALPINGOOPHORECTOMY  2009  ? left  ? UNILATERAL SALPINGECTOMY Right 02/04/2018  ? Procedure: UNILATERAL SALPINGECTOMY;  Surgeon: Noland FordyceFogleman, Kelly, MD;  [facility];  Service: Obstetrics;  Laterality: Right;  ? WISDOM TOOTH EXTRACTION    ? ?Patient Active Problem List  ? Diagnosis Date Noted  ? Vertigo 11/24/2021  ? Splenic artery aneurysm (HCC) 08/06/2021  ? Effusion of right ankle 07/31/2021  ? Traumatic arthritis of  ankle 07/31/2021  ? Iron deficiency anemia 11/15/2020  ? Slow transit constipation 11/15/2020  ? Microcytosis 11/15/2020  ? Arthralgia of right ankle 08/15/2020  ? Stiffness of right ankle joint 08/15/2020  ? Closed trimalleolar fracture of right ankle 05/21/2020  ? Gastro-esophageal reflux disease without esophagitis 04/13/2020  ? Morbid obesity (HCC) 01/20/2020  ? Vitamin D deficiency 01/20/2020  ? Prediabetes 01/20/2020  ? Insulin resistance 01/20/2020  ? Metabolic syndrome 01/20/2020  ? Allergic rhinitis 11/23/2016  ? Allergic conjunctivitis 01/17/2016  ? Mild intermittent asthma 01/17/2016  ? Dermatitis, eczematoid 06/21/2015  ? H/O cesarean section 06/21/2015  ? History of diabetes mellitus arising in pregnancy 06/21/2015  ? Headache, migraine 06/21/2015  ? ? ?REFERRING DIAG: N39.46 (ICD-10-CM) - Mixed incontinence urge and stress ? ?THERAPY DIAG:  ?Muscle weakness (generalized) ? ?Unspecified lack of coordination ? ?PERTINENT HISTORY: 2 c/s ? ?PRECAUTIONS: NA ? ?SUBJECTIVE: Patient states that urinary incontinence is getting a lot better. She is only having issues if she waits too long - 30 minutes after urge. HEP going well, but standing pelvic floor exercises most difficult.  ? ?SUBJECTIVE STATEMENT 02/06/22: ?Pt states that she has two children and after second delivery she has had urgency and stress urinary incontinence. She states that issues are much worse when she is standing up. She states that she's not getting any worse, but issues are  also not getting any better.  ?Fluid intake: Yes: 2 bottles of water a day   ?  ?Patient confirms identification and approves PT to assess pelvic floor and treatment Yes ?  ?  ? ?  ?PATIENT GOALS Decrease leaking ?  ?PERTINENT HISTORY:  ?2 cesareans ?Sexual abuse: No ?  ?BOWEL MOVEMENT ?Pain with bowel movement: No ?Type of bowel movement:Frequency every other day and Strain No ?Fully empty rectum: Yes: - ?Leakage: No ?Pads: No ?Fiber supplement: No ?  ?URINATION ?Pain  with urination: No ?Fully empty bladder: No - post-void dribbling and return to bathroom 10-15 minutes after she urinates ?Stream:  normal ?Urgency: Yes: not always, she;ll sit for long periods of time and ignore sensation to go and then get into emergency ?Frequency: she can go 2 times during work day, but then 2-3x at night ?Leakage: Urge to void, Coughing, Sneezing, and Laughing ?Pads: No ?  ?INTERCOURSE ?Pain with intercourse:  none ?Ability to have vaginal penetration:  Yes: - ?Climax: yes ?  ?PREGNANCY ?Vaginal deliveries 0 ?Tearing No ?C-section deliveries 2 ?Currently pregnant No ?  ?  ?  ?  ?OBJECTIVE:  ?  ?  ?COGNITION: ?           Overall cognitive status: Within functional limits for tasks assessed              ?            ?SENSATION: ?           Light touch: Appears intact ?           Proprioception: Appears intact ?  ?GAIT: WNL ?  ?  ?PELVIC MMT: ?2/5 strength, poor coordination with significant abdominal contraction/bracing, endurance 4 seconds, 10 repetitions switching more to abdomen than pelvic floor throughout  ?  ?      PALPATION: ?  General  No abdominal tenderness/restriction ?  ?              External Perineal Exam WNL ?              ?              Internal Pelvic Floor no tenderness ?  ?TONE: ?Moderate tone ?  ?PROLAPSE: ?Small amount of anterior vaginal wall laxity ?  ?TODAY'S TREATMENT 02/25/22: ?Manual: ?Soft tissue mobilization: ?Scar tissue mobilization: ?Myofascial release: ?Spinal mobilization: ?Internal pelvic floor techniques: ?Dry needling: ?Neuromuscular re-education: ?Core retraining:  ?Core facilitation: ?Leg extensions 10x bil ?Deadbug 10x - too difficult ?Alternating supine foot taps 10x - too difficult ?Bird-dog 10x ?BUE extension 2 x 10 green band ?Form correction: ?Pelvic floor contraction training: ?Down training: ?Exercises: ?Stretches/mobility: ?Strengthening: ?Squats 2 x 10 ?3-way kick 10x ea bil ?Therapeutic activities: ?Functional strengthening  activities: ?Self-care: ? ? ? ?TREATMENT 02/19/22: ?Manual: ?Soft tissue mobilization: ?Scar tissue mobilization: ?Myofascial release: ?Spinal mobilization: ?Internal pelvic floor techniques: ?Dry needling: ?Neuromuscular re-education: ?Core retraining:  ?Transversus abdominus training in supine/seated with multimodal cues ?Core facilitation: ?Supine march 2 x 10 ?Supine leg extensions 10x bil ?Seated horizontal abduction pull down 2 x 10 green band ?Reclining rotation 10x bil ?Form correction: ?Pelvic floor contraction training: ?Regular stance quick flicks 10x ?Wide stance quick flicks 10x ?Staggered stance quick flicks 10x bil ?Down training: ?Exercises: ?Stretches/mobility: ?Strengthening: ?Therapeutic activities: ?Functional strengthening activities: ?Self-care: ?Urge suppression technique ? ? ?TREATMENT 02/06/22: ?EVAL  ?Neuromuscular re-education: ?Pelvic floor contraction training: ?Quick flicks 2 x 10 with multi modal cues and breath coordination ?Long holds 5  x 10 sec holds ?Down training: ?Self-care: ?Double voiding ?The knack ?Pelvic floor anatomy ?Voiding schedule every 3 hours ?  ?  ?  ?PATIENT EDUCATION:  ?Education details: See above self-care ?Person educated: Patient ?Education method: Explanation, Demonstration, Tactile cues, Verbal cues, and Handouts ?Education comprehension: verbalized understanding ?  ?  ?HOME EXERCISE PROGRAM: ?[id number] ?  ?ASSESSMENT: ?  ?CLINICAL IMPRESSION: ?Patient continues to do well, reporting improvement in leaking and only having difficulty when she waits 30 minutes after initial urge. We discussed d/C planning for next session or two based on progress. Progressions of core exercises to dead bug and supine heel taps too difficult at this time with abdominal distortion even with cuing. She was instructed to stick with leg extensions at home for now. She did well with all other progressions with good form and improving breath coordination. She will continue to benefit from  skilled PT intervention in order to address impairments and decrease urinary incontinence.  ?  ?  ?OBJECTIVE IMPAIRMENTS decreased activity tolerance, decreased coordination, decreased endurance, decreased strength, and inc

## 2022-02-27 ENCOUNTER — Ambulatory Visit
Admission: RE | Admit: 2022-02-27 | Discharge: 2022-02-27 | Disposition: A | Discharge status: Home / Self Care | Payer: 59 | Source: Ambulatory Visit | Attending: Nurse Practitioner | Admitting: Nurse Practitioner

## 2022-02-27 DIAGNOSIS — I728 Aneurysm of other specified arteries: Secondary | ICD-10-CM

## 2022-02-27 MED ORDER — IOPAMIDOL (ISOVUE-300) INJECTION 61%
100.0000 mL | Freq: Once | INTRAVENOUS | Status: AC | PRN
  Administered 2022-02-27: 100 mL via INTRAVENOUS

## 2022-02-28 ENCOUNTER — Other Ambulatory Visit: Payer: Self-pay | Admitting: Family Medicine

## 2022-03-03 ENCOUNTER — Other Ambulatory Visit: Payer: Self-pay | Admitting: General Surgery

## 2022-03-03 ENCOUNTER — Telehealth (INDEPENDENT_AMBULATORY_CARE_PROVIDER_SITE_OTHER): Payer: Self-pay

## 2022-03-03 ENCOUNTER — Encounter: Payer: Self-pay | Admitting: Skilled Nursing Facility1

## 2022-03-03 ENCOUNTER — Encounter: Payer: 59 | Admitting: Skilled Nursing Facility1

## 2022-03-03 DIAGNOSIS — I728 Aneurysm of other specified arteries: Secondary | ICD-10-CM

## 2022-03-03 DIAGNOSIS — E669 Obesity, unspecified: Secondary | ICD-10-CM | POA: Diagnosis not present

## 2022-03-03 NOTE — Progress Notes (Signed)
Pre-Operative Nutrition Class:   ? ?Patient was seen on 03/03/2022 for Pre-Operative Bariatric Surgery Education at the [facility].   ? ?Surgery date:  ?Surgery type: RYGB ?Start weight at [facility]: 226 ?Weight today: 226.6 pounds ? ?Samples given per MNT protocol. Patient educated on appropriate usage: ?Bariatric Advantage Multivitamin ?Lot # [id number] ?Exp:08/24 ?  ?Bariatric Advantage Calcium  ?Lot #[id number] ?Exp: 08/25/2022 ?  ?Protein Shake ?Lot # [id number] ?Exp: [id number] ? ?The following the learning objectives were met by the patient during this course: ?Identify Pre-Op Dietary Goals and will begin 2 weeks pre-operatively ?Identify appropriate sources of fluids and proteins  ?State protein recommendations and appropriate sources pre and post-operatively ?Identify Post-Operative Dietary Goals and will follow for 2 weeks post-operatively ?Identify appropriate multivitamin and calcium sources ?Describe the need for physical activity post-operatively and will follow MD recommendations ?State when to call healthcare provider regarding medication questions or post-operative complications ?When having a diagnosis of diabetes understanding hypoglycemia symptoms and the inclusion of 1 complex carbohydrate per meal ? ?Handouts given during class include: ?Pre-Op Bariatric Surgery Diet Handout ?Protein Shake Handout ?Post-Op Bariatric Surgery Nutrition Handout ?BELT Program Information Flyer ?Support Group Information Flyer ?[facility] Bariatric Supplements Price List ? ?Follow-Up Plan: ?Patient will follow-up at [facility] 2 weeks post operatively for diet advancement per MD. ? ?

## 2022-03-03 NOTE — Telephone Encounter (Signed)
Patient left a message stating that she had a CT done with another provider last week and on the scan that saw that aneurysm has grown in size. I spoke with Dr Gilda Crease and recommended for patient to come in the for office visit. A message was left on the patient voicemail to return call back the office to schedule appointment  ?

## 2022-03-04 ENCOUNTER — Telehealth (INDEPENDENT_AMBULATORY_CARE_PROVIDER_SITE_OTHER): Payer: Self-pay | Admitting: Vascular Surgery

## 2022-03-05 ENCOUNTER — Telehealth (INDEPENDENT_AMBULATORY_CARE_PROVIDER_SITE_OTHER): Payer: Self-pay | Admitting: Nurse Practitioner

## 2022-03-05 NOTE — Telephone Encounter (Signed)
Got a VM from [place] H. Looking for information for Dr. Gaynelle Adu at [facility] in reference to patient having bariatric surgery. Dr. Andrey Campanile wants information from Dr. Gilda Crease as to the patient's process is going to look like.  She can be reached at [phone] and she stated it is a secure line to leave a message. ?

## 2022-03-09 NOTE — Progress Notes (Signed)
MRN : [id number]  Sheri Malone is a 36 y.o. ([birth year]) female who presents with chief complaint of check circulation.  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 The aneurysm was found incidentally by CT scan done to evaluate coronary artery disease. Scan was done 07/25/2021.  Patient denies abdominal pain or unusual back pain, no other abdominal complaints.  No history of an acute onset of painful blue discoloration of the toes.   At today's visit she does describe some nonspecific left-sided abdominal or flank pains.  This has been a chronic issue there have been no acute changes.   No family history of AAA.    Patient denies amaurosis fugax or TIA symptoms. There is no history of claudication or rest pain symptoms of the lower extremities.  The patient denies angina or shortness of breath.   CT scan dated 02/27/2022 demonstrates a 2.3 cm splenic artery aneurysm.  Previous CT showed a calcified splenic artery aneurysm that measures 14 mm however the inferior aspect of the aneurysm was not completely imaged.  I have reviewed both studies and when comparing similar locations of the aneurysm it does appear to have increased significantly.  I have also reviewed the studies with the patient and her husband  No outpatient medications have been marked as taking for the 03/10/22 encounter (Appointment) with Gilda Crease, Latina Craver, MD.    Past Medical History:  Diagnosis Date   Abnormal Pap smear    Asthma    GERD (gastroesophageal reflux disease)    Gestational HTN    Headache(784.0)    History of ectopic pregnancy    History of gestational diabetes    Low hemoglobin    on ironspan   Rhinitis    Vaginal Pap smear, abnormal     Past Surgical History:  Procedure Laterality Date   CESAREAN SECTION N/A 05/20/2013   Procedure: CESAREAN SECTION;  Surgeon: Genia Del, MD;  Location: [facility];  Service:  Obstetrics;  Laterality: N/A;  primary   CESAREAN SECTION N/A 02/04/2018   Procedure: Repeat CESAREAN SECTION;  Surgeon: Noland Fordyce, MD;  Location: [facility];  Service: Obstetrics;  Laterality: N/A;  EDD: 02/16/18   CHOLECYSTECTOMY N/A 09/28/2018   Procedure: LAPAROSCOPIC CHOLECYSTECTOMY;  Surgeon: Abigail Miyamoto, MD;  Location: [facility] OR;  Service: General;  Laterality: N/A;   DILATION AND CURETTAGE OF UTERUS  2009   SALPINGOOPHORECTOMY  2009   left   UNILATERAL SALPINGECTOMY Right 02/04/2018   Procedure: UNILATERAL SALPINGECTOMY;  Surgeon: Noland Fordyce, MD;  Location: [facility] SU[facility];  Service: Obstetrics;  Laterality: Right;   WISDOM TOOTH EXTRACTION      Social History Social History   Tobacco Use   Smoking status: Never   Smokeless tobacco: Never  Vaping Use   Vaping Use: Never used  Substance Use Topics   Alcohol use: Yes    Comment: occasionally/once a month   Drug use: No    Family History Family History  Problem Relation Age of Onset   Migraines Mother    Hypertension Mother    Breast cancer Mother    Diabetes Father    Hypertension Father    Multiple sclerosis Sister    Cancer Maternal Grandfather        prostate   Heart disease Paternal Grandfather    Stomach cancer Neg Hx    Rectal cancer Neg Hx    Esophageal cancer Neg Hx  Colon cancer Neg Hx     Allergies  Allergen Reactions   Dust Mite Mixed Allergen Ext [Mite (D. Farinae)] Anaphylaxis   Other Anaphylaxis and Other (See Comments)    DUST ROACH     REVIEW OF SYSTEMS (Negative unless checked)  Constitutional: [] Weight loss  [] Fever  [] Chills Cardiac: [] Chest pain   [] Chest pressure   [] Palpitations   [] Shortness of breath when laying flat   [] Shortness of breath with exertion. Vascular:  [x] Pain in legs with walking   [] Pain in legs at rest  [] History of DVT   [] Phlebitis   [] Swelling in legs   [] Varicose veins   [] Non-healing ulcers Pulmonary:   [] Uses home oxygen    [] Productive cough   [] Hemoptysis   [] Wheeze  [] COPD   [x] Asthma Neurologic:  [] Dizziness   [] Seizures   [] History of stroke   [] History of TIA  [] Aphasia   [] Vissual changes   [] Weakness or numbness in arm   [] Weakness or numbness in leg Musculoskeletal:   [] Joint swelling   [] Joint pain   [] Low back pain Hematologic:  [] Easy bruising  [] Easy bleeding   [] Hypercoagulable state   [] Anemic Gastrointestinal:  [] Diarrhea   [] Vomiting  [x] Gastroesophageal reflux/heartburn   [] Difficulty swallowing. Genitourinary:  [] Chronic kidney disease   [] Difficult urination  [] Frequent urination   [] Blood in urine Skin:  [] Rashes   [] Ulcers  Psychological:  [] History of anxiety   []  History of major depression.  Physical Examination  There were no vitals filed for this visit. There is no height or weight on file to calculate BMI. Gen: WD/WN, NAD Head: [place]/AT, No temporalis wasting.  Ear/Nose/Throat: Hearing grossly intact, nares w/o erythema or drainage Eyes: PER, EOMI, sclera nonicteric.  Neck: Supple, no masses.  No bruit or JVD.  Pulmonary:  Good air movement, no audible wheezing, no use of accessory muscles.  Cardiac: RRR, normal S1, S2, no Murmurs. Vascular: No abdominal or flank bruits  Vessel Right Left  Radial Palpable Palpable  Gastrointestinal: soft, non-distended. No guarding/no peritoneal signs.  Musculoskeletal: M/S 5/5 throughout.  No visible deformity.  Neurologic: CN 2-12 intact. Pain and light touch intact in extremities.  Symmetrical.  Speech is fluent. Motor exam as listed above. Psychiatric: Judgment intact, Mood & affect appropriate for pt's clinical situation. Dermatologic: No rashes or ulcers noted.  No changes consistent with cellulitis.   CBC Lab Results  Component Value Date   WBC 10.3 09/20/2021   HGB 12.5 09/20/2021   HCT 38.7 09/20/2021   MCV 80.8 09/20/2021   PLT 356 09/20/2021    BMET    Component Value Date/Time   NA 138 09/20/2021 1358   NA 139 07/19/2021  0951   K 4.2 09/20/2021 1358   CL 101 09/20/2021 1358   CO2 26 09/20/2021 1358   GLUCOSE 118 (H) 09/20/2021 1358   BUN 11 09/20/2021 1358   BUN 10 07/19/2021 0951   CREATININE 0.81 09/20/2021 1358   CALCIUM 9.4 09/20/2021 1358   GFRNONAA 104 11/15/2020 0922   GFRAA 121 11/15/2020 0922   CrCl cannot be calculated (Patient's most recent lab result is older than the maximum 21 days allowed.).  COAG No results found for: INR, PROTIME  Radiology CT ABDOMEN PELVIS W CONTRAST  Result Date: 03/02/2022 CLINICAL DATA:  Splenic aneurysm EXAM: CT ABDOMEN AND PELVIS WITH CONTRAST TECHNIQUE: Multidetector CT imaging of the abdomen and pelvis was performed using the standard protocol following bolus administration of intravenous contrast. RADIATION DOSE REDUCTION: This exam was performed according  to the departmental dose-optimization program which includes automated exposure control, adjustment of the mA and/or kV according to patient size and/or use of iterative reconstruction technique. CONTRAST:  100mL ISOVUE-300 IOPAMIDOL (ISOVUE-300) INJECTION 61% COMPARISON:  CT angiography heart 07/25/2021 FINDINGS: Lower chest: No acute abnormality. Hepatobiliary: No focal liver abnormality is seen. Status post cholecystectomy. No biliary dilatation. Pancreas: Unremarkable. No pancreatic ductal dilatation or surrounding inflammatory changes. Spleen: Normal in size without focal abnormality. Adrenals/Urinary Tract: Adrenal glands are unremarkable. Kidneys are normal, without renal calculi, focal lesion, or hydronephrosis. Diffuse bladder wall thickening likely due to under distension. Chronic cystitis and outlet obstruction can have a similar appearance. Stomach/Bowel: Stomach is within normal limits. Appendix appears normal. No evidence of bowel wall thickening, distention, or inflammatory changes. Vascular/Lymphatic: No enlarged abdominal or pelvic lymph nodes. Splenic artery aneurysm measures 2.3 x 2.3 cm. This  appears to have increased in size since prior examination, however exact comparison is difficult as the entire aneurysm was visualized prior study. Reproductive: Uterus and bilateral adnexa are unremarkable. Other: No abdominal wall hernia or abnormality. No abdominopelvic ascites. Musculoskeletal: No acute or significant osseous findings. IMPRESSION: 2.3 x 2.3 cm splenic artery aneurysm. Aneurysm appears larger compared to prior examination, however exact comparison is difficult as the prior exam did completely image the aneurysm. Vascular surgery consultation is recommended. These results will be called to the ordering clinician or representative by the Radiologist Assistant, and communication documented in the PACS or [facility] Dashboard. Electronically Signed   By: Farhaan  Mir M.D.   On: 03/02/2022 13:18     Assessment/Plan 1. Splenic artery aneurysm (HCC) No surgery or intervention at this time.  The patient has an asymptomatic splenic artery aneurysm that is less than 2.4 cm in maximal diameter.  It also appears to have grown significantly over the past year.  She is now undergoing work-up for bariatric surgery.  The increased risk of rupture during pregnancy was also discussed and although she has not planning on having any more children she is still of childbearing age.  Given all these factors the increased size the upcoming abdominal surgery and her age I have recommended we proceed with angiography and repair.  I have described both stent placement as well as coil embolization.  I have also described particularly with the embolization scenario the loss of splenic mass associated with this treatment.  The risk and benefits as well as alternative therapies have been discussed.  All questions have been answered.  We are in agreement and we will move forward with angiography and the intention for splenic artery aneurysm repair to prevent fatal rupture.  The patient voices their  understanding.  2. Gastro-esophageal reflux disease without esophagitis Continue PPI as already ordered, this medication has been reviewed and there are no changes at this time.  Avoidence of caffeine and alcohol  Moderate elevation of the head of the bed    3. Mild intermittent asthma without complication Continue pulmonary medications and aerosols as already ordered, these medications have been reviewed and there are no changes at this time.      Galan Ghee, MD  03/09/2022 3:57 PM    

## 2022-03-09 NOTE — H&P (View-Only) (Signed)
MRN : [id number]  Sheri Malone is a 36 y.o. ([birth year]) female who presents with chief complaint of check circulation.  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 The aneurysm was found incidentally by CT scan done to evaluate coronary artery disease. Scan was done 07/25/2021.  Patient denies abdominal pain or unusual back pain, no other abdominal complaints.  No history of an acute onset of painful blue discoloration of the toes.   At today's visit she does describe some nonspecific left-sided abdominal or flank pains.  This has been a chronic issue there have been no acute changes.   No family history of AAA.    Patient denies amaurosis fugax or TIA symptoms. There is no history of claudication or rest pain symptoms of the lower extremities.  The patient denies angina or shortness of breath.   CT scan dated 02/27/2022 demonstrates a 2.3 cm splenic artery aneurysm.  Previous CT showed a calcified splenic artery aneurysm that measures 14 mm however the inferior aspect of the aneurysm was not completely imaged.  I have reviewed both studies and when comparing similar locations of the aneurysm it does appear to have increased significantly.  I have also reviewed the studies with the patient and her husband  No outpatient medications have been marked as taking for the 03/10/22 encounter (Appointment) with Gilda Crease, Latina Craver, MD.    Past Medical History:  Diagnosis Date   Abnormal Pap smear    Asthma    GERD (gastroesophageal reflux disease)    Gestational HTN    Headache(784.0)    History of ectopic pregnancy    History of gestational diabetes    Low hemoglobin    on ironspan   Rhinitis    Vaginal Pap smear, abnormal     Past Surgical History:  Procedure Laterality Date   CESAREAN SECTION N/A 05/20/2013   Procedure: CESAREAN SECTION;  Surgeon: Genia Del, MD;  Location: [facility];  Service:  Obstetrics;  Laterality: N/A;  primary   CESAREAN SECTION N/A 02/04/2018   Procedure: Repeat CESAREAN SECTION;  Surgeon: Noland Fordyce, MD;  Location: [facility] BIRTHING SUITES;  Service: Obstetrics;  Laterality: N/A;  EDD: 02/16/18   CHOLECYSTECTOMY N/A 09/28/2018   Procedure: LAPAROSCOPIC CHOLECYSTECTOMY;  Surgeon: Abigail Miyamoto, MD;  Location: [facility] OR;  Service: General;  Laterality: N/A;   DILATION AND CURETTAGE OF UTERUS  2009   SALPINGOOPHORECTOMY  2009   left   UNILATERAL SALPINGECTOMY Right 02/04/2018   Procedure: UNILATERAL SALPINGECTOMY;  Surgeon: Noland Fordyce, MD;  Location: [facility] BIRTHING SUITES;  Service: Obstetrics;  Laterality: Right;   WISDOM TOOTH EXTRACTION      Social History Social History   Tobacco Use   Smoking status: Never   Smokeless tobacco: Never  Vaping Use   Vaping Use: Never used  Substance Use Topics   Alcohol use: Yes    Comment: occasionally/once a month   Drug use: No    Family History Family History  Problem Relation Age of Onset   Migraines Mother    Hypertension Mother    Breast cancer Mother    Diabetes Father    Hypertension Father    Multiple sclerosis Sister    Cancer Maternal Grandfather        prostate   Heart disease Paternal Grandfather    Stomach cancer Neg Hx    Rectal cancer Neg Hx    Esophageal cancer Neg Hx  Colon cancer Neg Hx     Allergies  Allergen Reactions   Dust Mite Mixed Allergen Ext [Mite (D. Farinae)] Anaphylaxis   Other Anaphylaxis and Other (See Comments)    DUST ROACH     REVIEW OF SYSTEMS (Negative unless checked)  Constitutional: [] Weight loss  [] Fever  [] Chills Cardiac: [] Chest pain   [] Chest pressure   [] Palpitations   [] Shortness of breath when laying flat   [] Shortness of breath with exertion. Vascular:  [x] Pain in legs with walking   [] Pain in legs at rest  [] History of DVT   [] Phlebitis   [] Swelling in legs   [] Varicose veins   [] Non-healing ulcers Pulmonary:   [] Uses home oxygen    [] Productive cough   [] Hemoptysis   [] Wheeze  [] COPD   [x] Asthma Neurologic:  [] Dizziness   [] Seizures   [] History of stroke   [] History of TIA  [] Aphasia   [] Vissual changes   [] Weakness or numbness in arm   [] Weakness or numbness in leg Musculoskeletal:   [] Joint swelling   [] Joint pain   [] Low back pain Hematologic:  [] Easy bruising  [] Easy bleeding   [] Hypercoagulable state   [] Anemic Gastrointestinal:  [] Diarrhea   [] Vomiting  [x] Gastroesophageal reflux/heartburn   [] Difficulty swallowing. Genitourinary:  [] Chronic kidney disease   [] Difficult urination  [] Frequent urination   [] Blood in urine Skin:  [] Rashes   [] Ulcers  Psychological:  [] History of anxiety   []  History of major depression.  Physical Examination  There were no vitals filed for this visit. There is no height or weight on file to calculate BMI. Gen: WD/WN, NAD Head: [place]/AT, No temporalis wasting.  Ear/Nose/Throat: Hearing grossly intact, nares w/o erythema or drainage Eyes: PER, EOMI, sclera nonicteric.  Neck: Supple, no masses.  No bruit or JVD.  Pulmonary:  Good air movement, no audible wheezing, no use of accessory muscles.  Cardiac: RRR, normal S1, S2, no Murmurs. Vascular: No abdominal or flank bruits  Vessel Right Left  Radial Palpable Palpable  Gastrointestinal: soft, non-distended. No guarding/no peritoneal signs.  Musculoskeletal: M/S 5/5 throughout.  No visible deformity.  Neurologic: CN 2-12 intact. Pain and light touch intact in extremities.  Symmetrical.  Speech is fluent. Motor exam as listed above. Psychiatric: Judgment intact, Mood & affect appropriate for pt's clinical situation. Dermatologic: No rashes or ulcers noted.  No changes consistent with cellulitis.   CBC Lab Results  Component Value Date   WBC 10.3 09/20/2021   HGB 12.5 09/20/2021   HCT 38.7 09/20/2021   MCV 80.8 09/20/2021   PLT 356 09/20/2021    BMET    Component Value Date/Time   NA 138 09/20/2021 1358   NA 139 07/19/2021  0951   K 4.2 09/20/2021 1358   CL 101 09/20/2021 1358   CO2 26 09/20/2021 1358   GLUCOSE 118 (H) 09/20/2021 1358   BUN 11 09/20/2021 1358   BUN 10 07/19/2021 0951   CREATININE 0.81 09/20/2021 1358   CALCIUM 9.4 09/20/2021 1358   GFRNONAA 104 11/15/2020 0922   GFRAA 121 11/15/2020 0922   CrCl cannot be calculated (Patient's most recent lab result is older than the maximum 21 days allowed.).  COAG No results found for: INR, PROTIME  Radiology CT ABDOMEN PELVIS W CONTRAST  Result Date: 03/02/2022 CLINICAL DATA:  Splenic aneurysm EXAM: CT ABDOMEN AND PELVIS WITH CONTRAST TECHNIQUE: Multidetector CT imaging of the abdomen and pelvis was performed using the standard protocol following bolus administration of intravenous contrast. RADIATION DOSE REDUCTION: This exam was performed according  to the departmental dose-optimization program which includes automated exposure control, adjustment of the mA and/or kV according to patient size and/or use of iterative reconstruction technique. CONTRAST:  100mL ISOVUE-300 IOPAMIDOL (ISOVUE-300) INJECTION 61% COMPARISON:  CT angiography heart 07/25/2021 FINDINGS: Lower chest: No acute abnormality. Hepatobiliary: No focal liver abnormality is seen. Status post cholecystectomy. No biliary dilatation. Pancreas: Unremarkable. No pancreatic ductal dilatation or surrounding inflammatory changes. Spleen: Normal in size without focal abnormality. Adrenals/Urinary Tract: Adrenal glands are unremarkable. Kidneys are normal, without renal calculi, focal lesion, or hydronephrosis. Diffuse bladder wall thickening likely due to under distension. Chronic cystitis and outlet obstruction can have a similar appearance. Stomach/Bowel: Stomach is within normal limits. Appendix appears normal. No evidence of bowel wall thickening, distention, or inflammatory changes. Vascular/Lymphatic: No enlarged abdominal or pelvic lymph nodes. Splenic artery aneurysm measures 2.3 x 2.3 cm. This  appears to have increased in size since prior examination, however exact comparison is difficult as the entire aneurysm was visualized prior study. Reproductive: Uterus and bilateral adnexa are unremarkable. Other: No abdominal wall hernia or abnormality. No abdominopelvic ascites. Musculoskeletal: No acute or significant osseous findings. IMPRESSION: 2.3 x 2.3 cm splenic artery aneurysm. Aneurysm appears larger compared to prior examination, however exact comparison is difficult as the prior exam did completely image the aneurysm. Vascular surgery consultation is recommended. These results will be called to the ordering clinician or representative by the Radiologist Assistant, and communication documented in the PACS or [employer] Dashboard. Electronically Signed   By: Acquanetta BellingFarhaan  Mir M.D.   On: 03/02/2022 13:18     Assessment/Plan 1. Splenic artery aneurysm (HCC) No surgery or intervention at this time.  The patient has an asymptomatic splenic artery aneurysm that is less than 2.4 cm in maximal diameter.  It also appears to have grown significantly over the past year.  She is now undergoing work-up for bariatric surgery.  The increased risk of rupture during pregnancy was also discussed and although she has not planning on having any more children she is still of childbearing age.  Given all these factors the increased size the upcoming abdominal surgery and her age I have recommended we proceed with angiography and repair.  I have described both stent placement as well as coil embolization.  I have also described particularly with the embolization scenario the loss of splenic mass associated with this treatment.  The risk and benefits as well as alternative therapies have been discussed.  All questions have been answered.  We are in agreement and we will move forward with angiography and the intention for splenic artery aneurysm repair to prevent fatal rupture.  The patient voices their  understanding.  2. Gastro-esophageal reflux disease without esophagitis Continue PPI as already ordered, this medication has been reviewed and there are no changes at this time.  Avoidence of caffeine and alcohol  Moderate elevation of the head of the bed    3. Mild intermittent asthma without complication Continue pulmonary medications and aerosols as already ordered, these medications have been reviewed and there are no changes at this time.      Levora DredgeGregory Naveah Brave, MD  03/09/2022 3:57 PM

## 2022-03-10 ENCOUNTER — Encounter (INDEPENDENT_AMBULATORY_CARE_PROVIDER_SITE_OTHER): Payer: Self-pay | Admitting: Vascular Surgery

## 2022-03-10 ENCOUNTER — Ambulatory Visit (INDEPENDENT_AMBULATORY_CARE_PROVIDER_SITE_OTHER): Payer: 59 | Admitting: Vascular Surgery

## 2022-03-10 ENCOUNTER — Ambulatory Visit (INDEPENDENT_AMBULATORY_CARE_PROVIDER_SITE_OTHER): Payer: 59

## 2022-03-10 VITALS — BP 127/87 | HR 87 | Resp 16 | Wt 225.0 lb

## 2022-03-10 DIAGNOSIS — K219 Gastro-esophageal reflux disease without esophagitis: Secondary | ICD-10-CM

## 2022-03-10 DIAGNOSIS — J4520 Mild intermittent asthma, uncomplicated: Secondary | ICD-10-CM

## 2022-03-10 DIAGNOSIS — I728 Aneurysm of other specified arteries: Secondary | ICD-10-CM

## 2022-03-11 ENCOUNTER — Ambulatory Visit: Payer: 59

## 2022-03-11 ENCOUNTER — Encounter (INDEPENDENT_AMBULATORY_CARE_PROVIDER_SITE_OTHER): Payer: Self-pay | Admitting: Vascular Surgery

## 2022-03-11 DIAGNOSIS — M6281 Muscle weakness (generalized): Secondary | ICD-10-CM

## 2022-03-11 DIAGNOSIS — R279 Unspecified lack of coordination: Secondary | ICD-10-CM

## 2022-03-11 NOTE — Therapy (Signed)
OUTPATIENT PHYSICAL THERAPY TREATMENT NOTE   Patient Name: Sheri Malone MRN: [id number] DOB:Aug 28, 1986, 36 y.o., female Today's Date: 03/11/2022  PCP: Kate Sable, MD REFERRING PROVIDER: [name], PA-C  END OF SESSION:   PT End of Session - 03/11/22 0906     Visit Number 4    Date for PT Re-Evaluation 05/01/22    Authorization Type [employer]    PT Start Time 0853    PT Stop Time 0921    PT Time Calculation (min) 28 min    Activity Tolerance Patient tolerated treatment well    Behavior During Therapy [facility] for tasks assessed/performed               Past Medical History:  Diagnosis Date   Abnormal Pap smear    Asthma    GERD (gastroesophageal reflux disease)    Gestational HTN    Headache(784.0)    History of ectopic pregnancy    History of gestational diabetes    Low hemoglobin    on ironspan   Rhinitis    Vaginal Pap smear, abnormal    Past Surgical History:  Procedure Laterality Date   CESAREAN SECTION N/A 05/20/2013   Procedure: CESAREAN SECTION;  Surgeon: Princess Bruins, MD;  Location: [place] [facility];  Service: Obstetrics;  Laterality: N/A;  primary   CESAREAN SECTION N/A 02/04/2018   Procedure: Repeat CESAREAN SECTION;  Surgeon: Aloha Gell, MD;  Location: [place];  Service: Obstetrics;  Laterality: N/A;  EDD: 02/16/18   CHOLECYSTECTOMY N/A 09/28/2018   Procedure: LAPAROSCOPIC CHOLECYSTECTOMY;  Surgeon: Coralie Keens, MD;  Location: [facility] OR;  Service: General;  Laterality: N/A;   [place] OF UTERUS  2009   SALPINGOOPHORECTOMY  2009   left   UNILATERAL SALPINGECTOMY Right 02/04/2018   Procedure: UNILATERAL SALPINGECTOMY;  Surgeon: Aloha Gell, MD;  Location: [place];  Service: Obstetrics;  Laterality: Right;   WISDOM TOOTH EXTRACTION     Patient Active Problem List   Diagnosis Date Noted   Vertigo 11/24/2021   Splenic artery aneurysm ([place]) 08/06/2021   Effusion of right ankle 07/31/2021   Traumatic arthritis  of ankle 07/31/2021   Iron deficiency anemia 11/15/2020   Slow transit constipation 11/15/2020   Microcytosis 11/15/2020   Arthralgia of right ankle 08/15/2020   Stiffness of right ankle joint 08/15/2020   Closed trimalleolar fracture of right ankle 05/21/2020   Gastro-esophageal reflux disease without esophagitis 04/13/2020   Morbid obesity ([place]) 01/20/2020   Vitamin D deficiency 01/20/2020   Prediabetes 01/20/2020   Insulin resistance 66/03/3015   Metabolic syndrome 10/28/3233   Allergic rhinitis 11/23/2016   Allergic conjunctivitis 01/17/2016   Mild intermittent asthma 01/17/2016   Dermatitis, eczematoid 06/21/2015   H/O cesarean section 06/21/2015   History of diabetes mellitus arising in pregnancy 06/21/2015   Headache, migraine 06/21/2015    REFERRING DIAG: N39.46 (ICD-10-CM) - Mixed incontinence urge and stress  THERAPY DIAG:  Muscle weakness (generalized)  Unspecified lack of coordination  PERTINENT HISTORY: 2 c/s  PRECAUTIONS: NA  SUBJECTIVE: Patient states that she feels much better and she is working on not holding urine too long because that is when she has the most difficulty. She feels 75% better overall and that the 25% left is mostly getting into different habits.   SUBJECTIVE STATEMENT 02/06/22: Pt states that she has two children and after second delivery she has had urgency and stress urinary incontinence. She states that issues are much worse when she is standing up. She states that  she's not getting any worse, but issues are also not getting any better.  Fluid intake: Yes: 2 bottles of water a day     Patient confirms identification and approves PT to assess pelvic floor and treatment Yes        PATIENT GOALS Decrease leaking   PERTINENT HISTORY:  2 cesareans Sexual abuse: No   BOWEL MOVEMENT Pain with bowel movement: No Type of bowel movement:Frequency every other day and Strain No Fully empty rectum: Yes: - Leakage: No Pads: No Fiber  supplement: No   URINATION Pain with urination: No Fully empty bladder: No - post-void dribbling and return to bathroom 10-15 minutes after she urinates Stream:  normal Urgency: Yes: not always, she;ll sit for long periods of time and ignore sensation to go and then get into emergency Frequency: she can go 2 times during work day, but then 2-3x at night Leakage: Urge to void, Coughing, Sneezing, and Laughing Pads: No   INTERCOURSE Pain with intercourse:  none Ability to have vaginal penetration:  Yes: - Climax: yes   PREGNANCY Vaginal deliveries 0 Tearing No C-section deliveries 2 Currently pregnant No         OBJECTIVE:      COGNITION:            Overall cognitive status: Within functional limits for tasks assessed                          SENSATION:            Light touch: Appears intact            Proprioception: Appears intact   GAIT: WNL     PELVIC MMT: 2/5 strength, poor coordination with significant abdominal contraction/bracing, endurance 4 seconds, 10 repetitions switching more to abdomen than pelvic floor throughout          PALPATION:   General  No abdominal tenderness/restriction                 External Perineal Exam WNL                             Internal Pelvic Floor no tenderness   TONE: Moderate tone   PROLAPSE: Small amount of anterior vaginal wall laxity   TODAY'S TREATMENT 03/11/22: Neuromuscular re-education: Core facilitation: Leg extensions 10x bil Seated chop 5 lbs 10x bil Standing march with anterior weight hold 3 lbs 2 x 10 Exercises: Stretches/mobility: Strengthening: Fire hydrants 2 x 10 Donkey kicks 2 x 10 Sidestepping 2 laps yellow band    TREATMENT 02/25/22: Manual: Soft tissue mobilization: Scar tissue mobilization: Myofascial release: Spinal mobilization: Internal pelvic floor techniques: Dry needling: Neuromuscular re-education: Core retraining:  Core facilitation: Leg extensions 10x bil Deadbug 10x - too  difficult Alternating supine foot taps 10x - too difficult Bird-dog 10x BUE extension 2 x 10 green band Form correction: Pelvic floor contraction training: Down training: Exercises: Stretches/mobility: Strengthening: Squats 2 x 10 3-way kick 10x ea bil Therapeutic activities: Functional strengthening activities: Self-care:    TREATMENT 02/19/22: Manual: Soft tissue mobilization: Scar tissue mobilization: Myofascial release: Spinal mobilization: Internal pelvic floor techniques: Dry needling: Neuromuscular re-education: Core retraining:  Transversus abdominus training in supine/seated with multimodal cues Core facilitation: Supine march 2 x 10 Supine leg extensions 10x bil Seated horizontal abduction pull down 2 x 10 green band Reclining rotation 10x bil Form correction: Pelvic floor contraction training: Regular  stance quick flicks 85I Wide stance quick flicks 77O Staggered stance quick flicks 24M bil Down training: Exercises: Stretches/mobility: Strengthening: Therapeutic activities: Functional strengthening activities: Self-care: Urge suppression technique      PATIENT EDUCATION:  Education details: HEP updates Person educated: Patient Education method: Explanation, Demonstration, Tactile cues, Verbal cues, and Handouts Education comprehension: verbalized understanding     HOME EXERCISE PROGRAM: [id number]   ASSESSMENT:   CLINICAL IMPRESSION: Patient is overall doing very well. We discussed remaining dysfunction being due to habits and holding urine too long, causing urgency. She did well with all core/pelvic floor exercise progressions today with good breath coordination and appropriate challenge. Due to having met goals, she is prepared to D/C skilled PT intervention at this time; she was encouraged to call with any questions or concerns.      OBJECTIVE IMPAIRMENTS decreased activity tolerance, decreased coordination, decreased endurance, decreased  strength, and increased fascial restrictions.    ACTIVITY LIMITATIONS  work and situations that increase abdominal pressure .    PERSONAL FACTORS 1 comorbidity: 2 cesareans  are also affecting patient's functional outcome.      REHAB POTENTIAL: Good   CLINICAL DECISION MAKING: Stable/uncomplicated   EVALUATION COMPLEXITY: Low     GOALS: Goals reviewed with patient? Yes   SHORT TERM GOALS: Target date: 03/06/2022   Pt will be independent with HEP.    Baseline: Goal status: MET   2.  Pt will be independent with the knack, urge suppression technique, and double voiding in order to improve bladder habits and decrease urinary incontinence.    Baseline:  Goal status: MET     LONG TERM GOALS: Target date: 05/01/2022   Pt will be independent with advanced HEP.    Baseline:  Goal status: MET   2.  Pt will demonstrate normal pelvic floor muscle tone and A/ROM, able to achieve 4/5 strength with contractions and 10 sec endurance, in order to provide appropriate lumbopelvic support in functional activities.    Baseline:  Goal status: MET   3.  Pt will report no episodes of urinary incontinence in order to improve confidence in community activities and personal hygiene.    Baseline: will still have some urgency when she waits to long Goal status: MET   4.  Pt will report no leaks with laughing, coughing, sneezing in order to improve comfort with interpersonal relationships and community activities.    Baseline:  Goal status: MET       PLAN: PT FREQUENCY: 1x/week   PT DURATION: 12 weeks   PLANNED INTERVENTIONS: Therapeutic exercises, Therapeutic activity, Neuromuscular re-education, Balance training, Gait training, Patient/Family education, Joint mobilization, Dry Needling, Biofeedback, and Manual therapy   PLAN FOR NEXT SESSION: Progress functional strengthening.  PHYSICAL THERAPY DISCHARGE SUMMARY  Visits from Start of Care: 4  Current functional level related to  goals / functional outcomes: Independent   Remaining deficits: See above   Education / Equipment: HEP   Patient agrees to discharge. Patient goals were met. Patient is being discharged due to meeting the stated rehab goals.   Heather Roberts, PT, DPT05/23/239:23 AM

## 2022-03-13 ENCOUNTER — Telehealth (INDEPENDENT_AMBULATORY_CARE_PROVIDER_SITE_OTHER): Payer: Self-pay

## 2022-03-13 NOTE — Telephone Encounter (Signed)
A call came through regarding doing a peer to peer.

## 2022-03-14 NOTE — Telephone Encounter (Signed)
Spoke with the patient and she is scheduled with Dr. Gilda Crease on 03/25/22 with a 9:00 am arrival time to the [facility] for her repair splenic artery aneurysm. Pre-procedure instructions were discussed and will be mailed.

## 2022-03-25 ENCOUNTER — Encounter
Admission: RE | Disposition: A | Discharge status: Home / Self Care | Payer: Self-pay | Source: Home / Self Care | Attending: Vascular Surgery

## 2022-03-25 ENCOUNTER — Encounter: Payer: Self-pay | Admitting: Vascular Surgery

## 2022-03-25 ENCOUNTER — Other Ambulatory Visit: Payer: Self-pay

## 2022-03-25 ENCOUNTER — Ambulatory Visit
Admission: RE | Admit: 2022-03-25 | Discharge: 2022-03-25 | Disposition: A | Discharge status: Home / Self Care | Payer: 59 | Attending: Vascular Surgery | Admitting: Vascular Surgery

## 2022-03-25 DIAGNOSIS — K219 Gastro-esophageal reflux disease without esophagitis: Secondary | ICD-10-CM | POA: Diagnosis not present

## 2022-03-25 DIAGNOSIS — Z23 Encounter for immunization: Secondary | ICD-10-CM | POA: Diagnosis not present

## 2022-03-25 DIAGNOSIS — J4520 Mild intermittent asthma, uncomplicated: Secondary | ICD-10-CM | POA: Insufficient documentation

## 2022-03-25 DIAGNOSIS — I728 Aneurysm of other specified arteries: Secondary | ICD-10-CM | POA: Insufficient documentation

## 2022-03-25 HISTORY — PX: VISCERAL ARTERY INTERVENTION: CATH118277

## 2022-03-25 LAB — CREATININE, SERUM
Creatinine, Ser: 0.75 mg/dL (ref 0.44–1.00)
GFR, Estimated: >60 mL/min (ref >60)

## 2022-03-25 LAB — BUN: BUN: 10 mg/dL (ref 6–20)

## 2022-03-25 SURGERY — VISCERAL ARTERY INTERVENTION
Anesthesia: Moderate Sedation

## 2022-03-25 MED ORDER — ACETAMINOPHEN 325 MG PO TABS: 650.0000 mg | ORAL_TABLET | ORAL | Status: DC | PRN

## 2022-03-25 MED ORDER — CEFAZOLIN SODIUM-DEXTROSE 1-4 GM/50ML-% IV SOLN
INTRAVENOUS | Status: DC | PRN
  Administered 2022-03-25: 2 g via INTRAVENOUS

## 2022-03-25 MED ORDER — HEPARIN SODIUM (PORCINE) 1000 UNIT/ML IJ SOLN
INTRAMUSCULAR | Status: DC | PRN
Start: 2022-03-25 — End: 2022-03-25
  Administered 2022-03-25: 3000 [IU] via INTRAVENOUS

## 2022-03-25 MED ORDER — HEPARIN SODIUM (PORCINE) 1000 UNIT/ML IJ SOLN
INTRAMUSCULAR | Status: AC
  Filled 2022-03-25: qty 10

## 2022-03-25 MED ORDER — SODIUM CHLORIDE 0.9 % IV SOLN: 250.0000 mL | INTRAVENOUS | Status: DC | PRN

## 2022-03-25 MED ORDER — HYDROMORPHONE HCL 1 MG/ML IJ SOLN
1.0000 mg | Freq: Once | INTRAMUSCULAR | Status: AC | PRN
  Administered 2022-03-25: 1 mg via INTRAVENOUS

## 2022-03-25 MED ORDER — CEFAZOLIN SODIUM-DEXTROSE 2-4 GM/100ML-% IV SOLN
INTRAVENOUS | Status: AC
  Filled 2022-03-25: qty 100

## 2022-03-25 MED ORDER — ONDANSETRON HCL 4 MG/2ML IJ SOLN
INTRAMUSCULAR | Status: AC
  Filled 2022-03-25: qty 2

## 2022-03-25 MED ORDER — SODIUM CHLORIDE 0.9 % IV SOLN: INTRAVENOUS | Status: DC

## 2022-03-25 MED ORDER — MIDAZOLAM HCL 5 MG/5ML IJ SOLN
INTRAMUSCULAR | Status: AC
  Filled 2022-03-25: qty 5

## 2022-03-25 MED ORDER — OXYCODONE HCL 5 MG PO TABS
ORAL_TABLET | ORAL | Status: AC
  Filled 2022-03-25: qty 2

## 2022-03-25 MED ORDER — MIDAZOLAM HCL 2 MG/2ML IJ SOLN
INTRAMUSCULAR | Status: DC | PRN
  Administered 2022-03-25 (×2): .5 mg via INTRAVENOUS
  Administered 2022-03-25: 1 mg via INTRAVENOUS
  Administered 2022-03-25: .5 mg via INTRAVENOUS
  Administered 2022-03-25: 2 mg via INTRAVENOUS
  Administered 2022-03-25: .5 mg via INTRAVENOUS

## 2022-03-25 MED ORDER — OXYCODONE-ACETAMINOPHEN 5-325 MG PO TABS: 1.0000 | ORAL_TABLET | Freq: Four times a day (QID) | ORAL | 0 refills | Status: DC | PRN

## 2022-03-25 MED ORDER — FENTANYL CITRATE (PF) 100 MCG/2ML IJ SOLN
INTRAMUSCULAR | Status: AC
  Filled 2022-03-25: qty 2

## 2022-03-25 MED ORDER — ONDANSETRON HCL 4 MG/2ML IJ SOLN: 4.0000 mg | Freq: Four times a day (QID) | INTRAMUSCULAR | Status: DC | PRN

## 2022-03-25 MED ORDER — METHYLPREDNISOLONE SODIUM SUCC 125 MG IJ SOLR: 125.0000 mg | Freq: Once | INTRAMUSCULAR | Status: DC | PRN

## 2022-03-25 MED ORDER — MORPHINE SULFATE (PF) 4 MG/ML IV SOLN: 2.0000 mg | INTRAVENOUS | Status: DC | PRN

## 2022-03-25 MED ORDER — HYDROMORPHONE HCL 1 MG/ML IJ SOLN
INTRAMUSCULAR | Status: AC
  Filled 2022-03-25: qty 1

## 2022-03-25 MED ORDER — FAMOTIDINE 20 MG PO TABS: 40.0000 mg | ORAL_TABLET | Freq: Once | ORAL | Status: DC | PRN

## 2022-03-25 MED ORDER — SODIUM CHLORIDE 0.9% FLUSH: 3.0000 mL | Freq: Two times a day (BID) | INTRAVENOUS | Status: DC

## 2022-03-25 MED ORDER — MIDAZOLAM HCL 2 MG/ML PO SYRP: 8.0000 mg | ORAL_SOLUTION | Freq: Once | ORAL | Status: DC | PRN

## 2022-03-25 MED ORDER — FENTANYL CITRATE PF 50 MCG/ML IJ SOSY
PREFILLED_SYRINGE | INTRAMUSCULAR | Status: AC
  Filled 2022-03-25: qty 1

## 2022-03-25 MED ORDER — OXYCODONE HCL 5 MG PO TABS
5.0000 mg | ORAL_TABLET | ORAL | Status: DC | PRN
  Administered 2022-03-25: 10 mg via ORAL

## 2022-03-25 MED ORDER — IODIXANOL 320 MG/ML IV SOLN
INTRAVENOUS | Status: DC | PRN
  Administered 2022-03-25: 50 mL via INTRA_ARTERIAL

## 2022-03-25 MED ORDER — SODIUM CHLORIDE 0.9% FLUSH: 3.0000 mL | INTRAVENOUS | Status: DC | PRN

## 2022-03-25 MED ORDER — FENTANYL CITRATE (PF) 100 MCG/2ML IJ SOLN
INTRAMUSCULAR | Status: DC | PRN
  Administered 2022-03-25 (×3): 25 ug via INTRAVENOUS
  Administered 2022-03-25: 50 ug via INTRAVENOUS
  Administered 2022-03-25 (×2): 25 ug via INTRAVENOUS

## 2022-03-25 MED ORDER — CEFAZOLIN SODIUM-DEXTROSE 2-4 GM/100ML-% IV SOLN: 2.0000 g | INTRAVENOUS | Status: DC

## 2022-03-25 MED ORDER — DIPHENHYDRAMINE HCL 50 MG/ML IJ SOLN
50.0000 mg | Freq: Once | INTRAMUSCULAR | Status: DC | PRN
Start: 2022-03-25 — End: 2022-03-25

## 2022-03-25 MED ORDER — ONDANSETRON HCL 4 MG PO TABS: 4.0000 mg | ORAL_TABLET | Freq: Three times a day (TID) | ORAL | 0 refills | Status: DC | PRN

## 2022-03-25 MED ORDER — PNEUMOCOCCAL 20-VAL CONJ VACC 0.5 ML IM SUSY
0.5000 mL | PREFILLED_SYRINGE | INTRAMUSCULAR | Status: AC | PRN
  Administered 2022-03-25: 0.5 mL via INTRAMUSCULAR
  Filled 2022-03-25: qty 0.5

## 2022-03-25 MED ORDER — ONDANSETRON HCL 4 MG/2ML IJ SOLN
4.0000 mg | Freq: Four times a day (QID) | INTRAMUSCULAR | Status: DC | PRN
  Administered 2022-03-25: 4 mg via INTRAVENOUS

## 2022-03-25 SURGICAL SUPPLY — 34 items
CATH ANGIO 5F PIGTAIL 65CM (CATHETERS) ×1 IMPLANT
CATH BEACON 5 .035 65 KMP TIP (CATHETERS) ×1 IMPLANT
CATH MICROCATH PRGRT 2.8F 110 (CATHETERS) IMPLANT
CATH NAVICROSS ANGLED 90CM (MICROCATHETER) ×1 IMPLANT
CATH VS15FR (CATHETERS) ×1 IMPLANT
COIL 400 COMPLEX SOFT 6X30CM (Vascular Products) ×1 IMPLANT
COIL 400 COMPLEX SOFT 8X60CM (Vascular Products) ×1 IMPLANT
COIL 400 COMPLEX STD 24X57CM (Vascular Products) ×2 IMPLANT
COIL 400 COMPLEX STD 28X60CM (Vascular Products) ×1 IMPLANT
COIL 400 COMPLEX STD 32X60CM (Vascular Products) ×2 IMPLANT
COIL 400 COMPLEX STD 6X20CM (Vascular Products) ×1 IMPLANT
COVER PROBE U/S 5X48 (MISCELLANEOUS) ×1 IMPLANT
DEVICE OCCLUSION POD8 (Vascular Products) IMPLANT
DEVICE STARCLOSE SE CLOSURE (Vascular Products) ×1 IMPLANT
DEVICE TORQUE .025-.038 (MISCELLANEOUS) ×1 IMPLANT
GLIDEWIRE STIFF .35X180X3 HYDR (WIRE) ×1 IMPLANT
GUIDEWIRE ADV .018X180CM (WIRE) ×1 IMPLANT
GUIDEWIRE PFTE-COATED .018X300 (WIRE) ×1 IMPLANT
HANDLE DETACHMENT COIL (MISCELLANEOUS) ×1 IMPLANT
MICROCATH PROGREAT 2.8F 110 CM (CATHETERS) ×2
NDL ENTRY 21GA 7CM ECHOTIP (NEEDLE) IMPLANT
NEEDLE ENTRY 21GA 7CM ECHOTIP (NEEDLE) ×2 IMPLANT
OCCLUSION DEVICE POD8 (Vascular Products) ×2 IMPLANT
PACK ANGIOGRAPHY (CUSTOM PROCEDURE TRAY) ×1 IMPLANT
SET INTRO CAPELLA COAXIAL (SET/KITS/TRAYS/PACK) ×1 IMPLANT
SHEATH ANL2 6FRX45 HC (SHEATH) ×1 IMPLANT
SHEATH BRITE TIP 5FRX11 (SHEATH) ×1 IMPLANT
SHEATH BRITE TIP 6FR X 23 (SHEATH) ×1 IMPLANT
STENT VIABAHN 6X100X120 (Permanent Stent) IMPLANT
SYR MEDRAD MARK 7 150ML (SYRINGE) ×1 IMPLANT
TUBING CONTRAST HIGH PRESS 72 (TUBING) ×1 IMPLANT
WIRE AMPLATZ SSTIFF .035X260CM (WIRE) ×1 IMPLANT
WIRE G.018X260 SHT (WIRE) ×1 IMPLANT
WIRE GUIDERIGHT .035X150 (WIRE) ×1 IMPLANT

## 2022-03-25 NOTE — Discharge Instructions (Signed)
As Dr Gilda Crease said, please call his office in the AM tomorrow to get a follow up appointment.  Please remove your bandage tomorrow and applied a Band-Aid to the insertion site.

## 2022-03-25 NOTE — Op Note (Signed)
[facility]  Percutaneous Study/Intervention Procedural Note     Surgeon(s): Best boy: none  Pre-operative Diagnosis: Splenic artery aneurysm  Post-operative diagnosis:  Same  Procedure(s) Performed:             1.  Ultrasound guidance for vascular access right femoral artery             2.  Catheter placement into splenic artery third order catheter placement             3.  Aortogram and selective angiogram of the splenic artery and distal splenic vessels             4.  Ruby coil embolization of splenic artery aneurysm with a total of 9 Ruby coils.             5.  StarClose closure device right femoral artery  Anesthesia: Moderate conscious sedation for approximately 1 hour 37 minutes minutes using parenteral Versed and  Fentanyl.  Continuous ECG pulse oximetry and cardiopulmonary monitoring was performed throughout the entire procedure by the interventional radiology nurse total sedation time was documented above             EBL: 10  Fluoro Time: 21.6 minutes  Contrast: 50 cc              Indications:  Patient is a 36 y.o.female with the incidental finding of a large splenic artery aneurysm.  This was identified on a chest CT.  Review of the chart indicated for that this had increased in size from a previous study.  Given her young age as well as the high likelihood she will undergo bariatric surgery in the near future it was recommended that she undergo treatment of her splenic artery aneurysm at this time.  Risks and benefits are discussed and informed consent is obtained therapies including covered stent placement as well as coil embolization were discussed in detail.  Patient and her husband were present for the discussion patient has agreed to proceed.  Procedure:  The patient was identified and appropriate procedural time out was performed.  The patient was then placed supine on the table and prepped and draped in the usual sterile  fashion. Moderate conscious sedation was administered during a face to face encounter with the patient throughout the procedure with my supervision of the RN administering medicines and monitoring the patient's vital signs, pulse oximetry, telemetry and mental status throughout the entire procedure.   Ultrasound was used to evaluate the right common femoral artery.  It was echolucent and pulsatile indicating it was patent .  A digital ultrasound image was acquired.  A micropuncture needle was used to access the right common femoral artery under direct ultrasound guidance and a microwire was advanced followed by microsheath..  A 0.035 J wire was advanced without resistance and a 5Fr sheath was placed.  Pigtail catheter was placed into the aorta and an lateral aortogram was performed.   This demonstrated the visceral segment of the aorta and showed that the celiac axis as well as the SMA were widely patent and their origins.  A V S1 catheter was used to selectively cannulate the celiac and the stiff angle Glidewire was advanced out toward the periphery.  I was able to advance the V S1 catheter out and then the 6 [place] Ansell sheath.  Having achieved adequate purchase from the sheath the V S1 catheter was removed and we then transition back to the AP projection  and hand-injection of contrast this demonstrated the anatomy of splenic artery including a very large splenic artery aneurysm which now measures 26 mm in diameter.  I then exchanged the V S1 for a Kumpe catheter and using the stiff angle Glidewire was able to negotiate the Kumpe catheter into the sac of the aneurysm.  Then using a combination of a prograde wire catheter as well as a 0.018 advantage wire and the Kumpe catheter I was able to negotiate the wire and Kumpe catheter through the aneurysm into the outflow tract where hand-injection of contrast demonstrated the distal most splenic artery anatomy.  At this point I was able to hub the [facility] sheath.   Initially I exchanged the 0.018 advantage wire for a 0.018 Platinum Plus wire.  Once I had achieved proper wire purchase I attempted to advance a 6 x 100 Viabahn stent across the origin of the aneurysm however due to the sharp angle of the distal anatomy this stent would not go and actually kicked the sheath back.  I removed the 6 mm and was hoping that a smaller 5 mm shorter stent (a 5 mm x 50 mm Viabahn stent) would be able to cross unfortunately this did the same thing it pushed the sheath back and would not cross into the outflow.  After multiple attempts and numerous wire exchanges I did not feel I had any option for covering the origin of the aneurysm and therefore move forward with the plan to coil embolize the aneurysm.  With a Kumpe catheter approximately 1 cm past the aneurysm distally I advanced a prograde catheter and then placed 2 Ruby coils the first was a 6 mm x 20 standard and next was a 6 mm x 30 cm soft.  These packed nicely into the outflow vessel.  I then readjusted the Kumpe and the prograde catheter into the sac itself where hand-injection contrast confirmed location.  I then placed a 24 mm x 60 cm standard x2 followed by a 28 mm x 60 cm standard and then 232 mm x 60 cm standard coils.  I then repositioned the Kumpe into the splenic artery proper and just proximal to the aneurysm I placed a 8 mm pod 60 cm in length followed by a 8 mm x 60 cm soft Ruby coil.  At this point there appears to be excellent placement with good packing.  Hand-injection contrast demonstrated there was still minimal flow but essentially the splenic artery aneurysm had been excluded.  Having successfully treated the aneurysm I elected to end the case.  Catheter was exchanged for an 11 cm Pinnacle sheath and a and RAO projection of the right groin was obtained.  StarClose closure device was deployed in usual fashion with excellent hemostatic result. The patient was taken to the recovery room in stable condition  having tolerated the procedure well.     Findings: Initial imaging of the splenic artery demonstrated normal splenic artery measuring approximately 6 to 7 mm in diameter.  Near the splenic hilum there is a large 26 mm in diameter splenic artery aneurysm.  Outflow appears to be relatively normal with the vessel measuring approximately 4 to 5 mm in diameter.  After attempts at covering this area with a Viabahn stent were not successful I elected to move forward with coil embolization to prevent future rupture.  Ruby coils were deployed without difficulty as described above with an excellent result.  Disposition: Patient was taken to the recovery room in stable condition having tolerated  the procedure well.  Complications:  None  Levora Dredge 03/25/2022 2:59 PM   This note was created with Dragon Medical transcription system. Any errors in dictation are purely unintentional.

## 2022-03-25 NOTE — Interval H&P Note (Signed)
History and Physical Interval Note:  03/25/2022 11:34 AM  Sheri Malone  has presented today for surgery, with the diagnosis of Splenic artery aneurysm repair   Splenic artery aneurysm  GORE Rep.  The various methods of treatment have been discussed with the patient and family. After consideration of risks, benefits and other options for treatment, the patient has consented to  Procedure(s): VISCERAL ARTERY INTERVENTION (N/A) as a surgical intervention.  The patient's history has been reviewed, patient examined, no change in status, stable for surgery.  I have reviewed the patient's chart and labs.  Questions were answered to the patient's satisfaction.     [name]

## 2022-03-26 ENCOUNTER — Encounter: Payer: Self-pay | Admitting: Vascular Surgery

## 2022-03-28 ENCOUNTER — Encounter (INDEPENDENT_AMBULATORY_CARE_PROVIDER_SITE_OTHER): Payer: Self-pay | Admitting: Vascular Surgery

## 2022-03-29 ENCOUNTER — Other Ambulatory Visit: Payer: Self-pay

## 2022-03-29 ENCOUNTER — Observation Stay
Admission: EM | Admit: 2022-03-29 | Discharge: 2022-03-30 | Disposition: A | Discharge status: Home / Self Care | Payer: 59 | Attending: Hospitalist | Admitting: Hospitalist

## 2022-03-29 ENCOUNTER — Emergency Department: Payer: 59

## 2022-03-29 DIAGNOSIS — Z20822 Contact with and (suspected) exposure to covid-19: Secondary | ICD-10-CM | POA: Diagnosis not present

## 2022-03-29 DIAGNOSIS — R6510 Systemic inflammatory response syndrome (SIRS) of non-infectious origin without acute organ dysfunction: Secondary | ICD-10-CM | POA: Diagnosis not present

## 2022-03-29 DIAGNOSIS — D735 Infarction of spleen: Secondary | ICD-10-CM | POA: Insufficient documentation

## 2022-03-29 DIAGNOSIS — R1012 Left upper quadrant pain: Secondary | ICD-10-CM | POA: Diagnosis present

## 2022-03-29 DIAGNOSIS — Z6841 Body Mass Index (BMI) 40.0 and over, adult: Secondary | ICD-10-CM | POA: Diagnosis not present

## 2022-03-29 DIAGNOSIS — J189 Pneumonia, unspecified organism: Principal | ICD-10-CM | POA: Insufficient documentation

## 2022-03-29 DIAGNOSIS — Z8679 Personal history of other diseases of the circulatory system: Secondary | ICD-10-CM

## 2022-03-29 DIAGNOSIS — K219 Gastro-esophageal reflux disease without esophagitis: Secondary | ICD-10-CM | POA: Diagnosis present

## 2022-03-29 DIAGNOSIS — Z9889 Other specified postprocedural states: Secondary | ICD-10-CM | POA: Diagnosis not present

## 2022-03-29 DIAGNOSIS — A419 Sepsis, unspecified organism: Secondary | ICD-10-CM

## 2022-03-29 DIAGNOSIS — J4520 Mild intermittent asthma, uncomplicated: Secondary | ICD-10-CM | POA: Insufficient documentation

## 2022-03-29 DIAGNOSIS — Z79899 Other long term (current) drug therapy: Secondary | ICD-10-CM | POA: Insufficient documentation

## 2022-03-29 DIAGNOSIS — E66813 Obesity, class 3: Secondary | ICD-10-CM

## 2022-03-29 LAB — CBC
HCT: 35 % — ABNORMAL LOW (ref 36.0–46.0)
Hemoglobin: 10.9 g/dL — ABNORMAL LOW (ref 12.0–15.0)
MCH: 25.3 pg — ABNORMAL LOW (ref 26.0–34.0)
MCHC: 31.1 g/dL (ref 30.0–36.0)
MCV: 81.4 fL (ref 80.0–100.0)
Platelets: 437 10*3/uL — ABNORMAL HIGH (ref 150–400)
RBC: 4.3 MIL/uL (ref 3.87–5.11)
RDW: 12.9 % (ref 11.5–15.5)
WBC: 14.6 10*3/uL — ABNORMAL HIGH (ref 4.0–10.5)
nRBC: 0 % (ref 0.0–0.2)

## 2022-03-29 LAB — LACTIC ACID, PLASMA: Lactic Acid, Venous: 0.8 mmol/L (ref 0.5–1.9)

## 2022-03-29 LAB — COMPREHENSIVE METABOLIC PANEL
ALT: 12 U/L (ref 0–44)
AST: 13 U/L — ABNORMAL LOW (ref 15–41)
Albumin: 3.5 g/dL (ref 3.5–5.0)
Alkaline Phosphatase: 34 U/L — ABNORMAL LOW (ref 38–126)
Anion gap: 9 (ref 5–15)
BUN: 9 mg/dL (ref 6–20)
CO2: 25 mmol/L (ref 22–32)
Calcium: 8.9 mg/dL (ref 8.9–10.3)
Chloride: 102 mmol/L (ref 98–111)
Creatinine, Ser: 0.74 mg/dL (ref 0.44–1.00)
GFR, Estimated: >60 mL/min (ref >60)
Glucose, Bld: 107 mg/dL — ABNORMAL HIGH (ref 70–99)
Potassium: 3.7 mmol/L (ref 3.5–5.1)
Sodium: 136 mmol/L (ref 135–145)
Total Bilirubin: 0.6 mg/dL (ref 0.3–1.2)
Total Protein: 7.1 g/dL (ref 6.5–8.1)

## 2022-03-29 LAB — POC URINE PREG, ED: Preg Test, Ur: NEGATIVE

## 2022-03-29 MED ORDER — MORPHINE SULFATE (PF) 4 MG/ML IV SOLN
4.0000 mg | Freq: Once | INTRAVENOUS | Status: AC
  Administered 2022-03-29: 4 mg via INTRAVENOUS
  Filled 2022-03-29: qty 1

## 2022-03-29 MED ORDER — HYDROMORPHONE HCL 1 MG/ML IJ SOLN
1.0000 mg | Freq: Once | INTRAMUSCULAR | Status: AC
  Administered 2022-03-29: 1 mg via INTRAVENOUS
  Filled 2022-03-29: qty 1

## 2022-03-29 MED ORDER — SODIUM CHLORIDE 0.9 % IV BOLUS
1000.0000 mL | Freq: Once | INTRAVENOUS | Status: AC
  Administered 2022-03-29: 1000 mL via INTRAVENOUS

## 2022-03-29 MED ORDER — MORPHINE SULFATE (PF) 4 MG/ML IV SOLN: 4.0000 mg | Freq: Once | INTRAVENOUS | Status: DC

## 2022-03-29 MED ORDER — OXYCODONE-ACETAMINOPHEN 5-325 MG PO TABS: 1.0000 | ORAL_TABLET | Freq: Four times a day (QID) | ORAL | 0 refills | Status: DC | PRN

## 2022-03-29 MED ORDER — ONDANSETRON HCL 4 MG/2ML IJ SOLN
4.0000 mg | Freq: Once | INTRAMUSCULAR | Status: AC
  Administered 2022-03-29: 4 mg via INTRAVENOUS
  Filled 2022-03-29: qty 2

## 2022-03-29 MED ORDER — IOHEXOL 300 MG/ML  SOLN
100.0000 mL | Freq: Once | INTRAMUSCULAR | Status: AC | PRN
  Administered 2022-03-29: 100 mL via INTRAVENOUS

## 2022-03-29 NOTE — Discharge Instructions (Signed)
Please follow-up with Dr. Gilda Crease on Monday for recheck/reevaluation.  Please take your pain medication but only as prescribed.  Do not drink alcohol or drive while taking this medication.  Return to the emergency department for any acute worsening pain or any other symptom personally concerning to yourself.

## 2022-03-29 NOTE — ED Triage Notes (Signed)
Pt presents post op on Tuesday after aneurysm repair on her spleen by vascular surgery.  Pt reports uncontrolled pain, fingers going numb, chills, no fever.  Nausea, which she felt was expected but no vomiting or diarrhea.  Feeling shob d/t pain

## 2022-03-29 NOTE — ED Notes (Signed)
Pt ambulated to bathroom and back.  In doing so, pain increased, pt HR increased 120s, pt experienced DOE.  Pt back in bed and states relief at this time.

## 2022-03-29 NOTE — ED Provider Notes (Signed)
[facility] Provider Note    Event Date/Time   First MD Initiated Contact with Patient 03/29/22 2112     (approximate)  History   Chief Complaint: Post-op Problem  HPI  Sheri Malone is a 36 y.o. female with a past medical history of gastric reflux, 4 days status post splenic artery aneurysm coiling who presents to the emergency department for left upper quadrant abdominal pain.  According to the patient since the surgery she has been experiencing left upper quadrant abdominal pain that she states has somewhat worsened and has not improved.  Patient denies any vomiting diarrhea or fever.  Patient was concerned given the degree of pain she is in currently rates as a 8/10 in severity and so she came to the emergency department for evaluation.  Physical Exam   Triage Vital Signs: ED Triage Vitals  Enc Vitals Group     BP 03/29/22 2059 (!) 146/98     Pulse Rate 03/29/22 2059 (!) 115     Resp 03/29/22 2059 16     Temp 03/29/22 2100 99.6 F (37.6 C)     Temp Source 03/29/22 2100 Oral     SpO2 03/29/22 2059 95 %     Weight --      Height --      Head Circumference --      Peak Flow --      Pain Score 03/29/22 2057 10     Pain Loc --      Pain Edu? --      Excl. in [place]? --     Most recent vital signs: Vitals:   03/29/22 2059 03/29/22 2100  BP: (!) 146/98   Pulse: (!) 115   Resp: 16   Temp:  99.6 F (37.6 C)  SpO2: 95%     General: Awake, no distress.  CV:  Good peripheral perfusion.  Regular rate and rhythm  Resp:  Normal effort.  Equal breath sounds bilaterally.  Abd:  No distention.  Soft, nontender.  No rebound or guarding.  Benign abdomen to palpation   ED Results / Procedures / Treatments   RADIOLOGY  I reviewed CT images.  Possible splenic infarction on my interpretation.  Awaiting radiology read.   MEDICATIONS ORDERED IN ED: Medications  morphine (PF) 4 MG/ML injection 4 mg (has no administration in time range)  ondansetron  (ZOFRAN) injection 4 mg (has no administration in time range)  sodium chloride 0.9 % bolus 1,000 mL (has no administration in time range)     IMPRESSION / MDM / ASSESSMENT AND PLAN / ED COURSE  I reviewed the triage vital signs and the nursing notes.  Patient's presentation is most consistent with acute presentation with potential threat to life or bodily function.  Patient presents to the emergency department for left upper quadrant abdominal pain.  Patient is 4 days status post splenic artery aneurysm coiling.  Differential would include splenic infarction, intra-abdominal infection such as abscess, other intra-abdominal infection such as colitis, diverticulitis less likely UTI or pyelonephritis.  We will check labs including blood work, urine.  We will obtain a CT scan with contrast of the abdomen/pelvis.  We will IV hydrate treat pain and nausea while awaiting CT imaging.  Patient agreeable to plan of care.  I reviewed the CT images.  Somewhat obscured by coiling interference.  CT scan does appear to show possible splenic infarction which could be the cause of the patient's pain.  Awaiting radiology read.  Patient's  blood work is reassuring besides a mild leukocytosis on CBC.  Normal chemistry.  Negative pregnancy test.  Normal lactic acid.  CT scan shows splenic infarction but no other acute abnormality.  We will dose an additional dose of pain medication.  I informed the patient of the findings.  She is agreeable to go home will follow-up with her surgeon on Monday.  We will prescribe more medication for the patient.  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   Harvest Dark, MD 03/29/22 [phone]

## 2022-03-30 ENCOUNTER — Encounter: Payer: Self-pay | Admitting: Family Medicine

## 2022-03-30 ENCOUNTER — Encounter: Payer: Self-pay | Admitting: Internal Medicine

## 2022-03-30 ENCOUNTER — Emergency Department: Payer: 59

## 2022-03-30 DIAGNOSIS — R6510 Systemic inflammatory response syndrome (SIRS) of non-infectious origin without acute organ dysfunction: Secondary | ICD-10-CM

## 2022-03-30 DIAGNOSIS — J189 Pneumonia, unspecified organism: Secondary | ICD-10-CM | POA: Diagnosis present

## 2022-03-30 DIAGNOSIS — Z9889 Other specified postprocedural states: Secondary | ICD-10-CM

## 2022-03-30 DIAGNOSIS — Z8679 Personal history of other diseases of the circulatory system: Secondary | ICD-10-CM

## 2022-03-30 LAB — CBC
HCT: 30.3 % — ABNORMAL LOW (ref 36.0–46.0)
Hemoglobin: 9.5 g/dL — ABNORMAL LOW (ref 12.0–15.0)
MCH: 25.3 pg — ABNORMAL LOW (ref 26.0–34.0)
MCHC: 31.4 g/dL (ref 30.0–36.0)
MCV: 80.6 fL (ref 80.0–100.0)
Platelets: 389 K/uL (ref 150–400)
RBC: 3.76 MIL/uL — ABNORMAL LOW (ref 3.87–5.11)
RDW: 12.9 % (ref 11.5–15.5)
WBC: 10.8 K/uL — ABNORMAL HIGH (ref 4.0–10.5)
nRBC: 0 % (ref 0.0–0.2)

## 2022-03-30 LAB — URINALYSIS, ROUTINE W REFLEX MICROSCOPIC
Bacteria, UA: NONE SEEN
Bilirubin Urine: NEGATIVE
Glucose, UA: NEGATIVE mg/dL
Ketones, ur: 5 mg/dL — AB
Leukocytes,Ua: NEGATIVE
Nitrite: NEGATIVE
Protein, ur: NEGATIVE mg/dL
Specific Gravity, Urine: 1.035 — ABNORMAL HIGH (ref 1.005–1.030)
pH: 7 (ref 5.0–8.0)

## 2022-03-30 LAB — LACTIC ACID, PLASMA
Lactic Acid, Venous: 1.2 mmol/L (ref 0.5–1.9)
Lactic Acid, Venous: 1.1 mmol/L (ref 0.5–1.9)

## 2022-03-30 LAB — PROTIME-INR
INR: 1.3 — ABNORMAL HIGH (ref 0.8–1.2)
Prothrombin Time: 15.8 s — ABNORMAL HIGH (ref 11.4–15.2)

## 2022-03-30 LAB — CREATININE, SERUM
Creatinine, Ser: 0.76 mg/dL (ref 0.44–1.00)
GFR, Estimated: >60 mL/min (ref >60)

## 2022-03-30 LAB — HIV ANTIBODY (ROUTINE TESTING W REFLEX): HIV Screen 4th Generation wRfx: NONREACTIVE

## 2022-03-30 LAB — PROCALCITONIN: Procalcitonin: <0.1 ng/mL

## 2022-03-30 LAB — SARS CORONAVIRUS 2 BY RT PCR: SARS Coronavirus 2 by RT PCR: NEGATIVE

## 2022-03-30 LAB — CORTISOL-AM, BLOOD: Cortisol - AM: 9.9 ug/dL (ref 6.7–22.6)

## 2022-03-30 MED ORDER — LIDOCAINE VISCOUS HCL 2 % MT SOLN
15.0000 mL | Freq: Once | OROMUCOSAL | Status: AC
  Administered 2022-03-30: 15 mL via ORAL
  Filled 2022-03-30: qty 15

## 2022-03-30 MED ORDER — ACETAMINOPHEN 650 MG RE SUPP: 650.0000 mg | Freq: Four times a day (QID) | RECTAL | Status: DC | PRN

## 2022-03-30 MED ORDER — ONDANSETRON HCL 4 MG/2ML IJ SOLN: 4.0000 mg | Freq: Four times a day (QID) | INTRAMUSCULAR | Status: DC | PRN

## 2022-03-30 MED ORDER — PANTOPRAZOLE SODIUM 40 MG PO TBEC
40.0000 mg | DELAYED_RELEASE_TABLET | Freq: Every day | ORAL | Status: DC
  Administered 2022-03-30: 40 mg via ORAL
  Filled 2022-03-30: qty 1

## 2022-03-30 MED ORDER — MORPHINE SULFATE (PF) 2 MG/ML IV SOLN: 2.0000 mg | INTRAVENOUS | Status: DC | PRN

## 2022-03-30 MED ORDER — VANCOMYCIN HCL 1500 MG/300ML IV SOLN
1500.0000 mg | Freq: Once | INTRAVENOUS | Status: AC
  Administered 2022-03-30: 1500 mg via INTRAVENOUS
  Filled 2022-03-30 (×2): qty 300

## 2022-03-30 MED ORDER — SODIUM CHLORIDE 0.9 % IV SOLN
2.0000 g | Freq: Once | INTRAVENOUS | Status: AC
  Administered 2022-03-30: 2 g via INTRAVENOUS
  Filled 2022-03-30: qty 12.5

## 2022-03-30 MED ORDER — VANCOMYCIN HCL 1500 MG/300ML IV SOLN: 1500.0000 mg | INTRAVENOUS | Status: DC

## 2022-03-30 MED ORDER — ACETAMINOPHEN 500 MG PO TABS
1000.0000 mg | ORAL_TABLET | Freq: Once | ORAL | Status: AC
  Administered 2022-03-30: 1000 mg via ORAL
  Filled 2022-03-30: qty 2

## 2022-03-30 MED ORDER — ONDANSETRON HCL 4 MG PO TABS: 4.0000 mg | ORAL_TABLET | Freq: Four times a day (QID) | ORAL | Status: DC | PRN

## 2022-03-30 MED ORDER — AZITHROMYCIN 250 MG PO TABS
500.0000 mg | ORAL_TABLET | Freq: Every day | ORAL | Status: DC
  Filled 2022-03-30: qty 2

## 2022-03-30 MED ORDER — VANCOMYCIN HCL IN DEXTROSE 1-5 GM/200ML-% IV SOLN
1000.0000 mg | Freq: Once | INTRAVENOUS | Status: AC
  Administered 2022-03-30: 1000 mg via INTRAVENOUS
  Filled 2022-03-30: qty 200

## 2022-03-30 MED ORDER — HYDROCODONE-ACETAMINOPHEN 5-325 MG PO TABS
1.0000 | ORAL_TABLET | ORAL | Status: DC | PRN
  Administered 2022-03-30: 1 via ORAL
  Filled 2022-03-30: qty 1

## 2022-03-30 MED ORDER — PANTOPRAZOLE SODIUM 40 MG PO TBEC
40.0000 mg | DELAYED_RELEASE_TABLET | Freq: Once | ORAL | Status: AC
Start: 2022-03-30 — End: 2022-03-30
  Administered 2022-03-30: 40 mg via ORAL
  Filled 2022-03-30: qty 1

## 2022-03-30 MED ORDER — ENOXAPARIN SODIUM 60 MG/0.6ML IJ SOSY
0.5000 mg/kg | PREFILLED_SYRINGE | INTRAMUSCULAR | Status: DC
  Filled 2022-03-30: qty 0.6

## 2022-03-30 MED ORDER — SODIUM CHLORIDE 0.9 % IV SOLN
2.0000 g | Freq: Three times a day (TID) | INTRAVENOUS | Status: DC
  Filled 2022-03-30: qty 12.5

## 2022-03-30 MED ORDER — ALUM & MAG HYDROXIDE-SIMETH 200-200-20 MG/5ML PO SUSP
30.0000 mL | Freq: Once | ORAL | Status: AC
  Administered 2022-03-30: 30 mL via ORAL
  Filled 2022-03-30: qty 30

## 2022-03-30 MED ORDER — ACETAMINOPHEN 325 MG PO TABS: 650.0000 mg | ORAL_TABLET | Freq: Four times a day (QID) | ORAL | Status: DC | PRN

## 2022-03-30 MED ORDER — SODIUM CHLORIDE 0.9 % IV SOLN
2.0000 g | INTRAVENOUS | Status: DC
  Filled 2022-03-30: qty 20

## 2022-03-30 MED ORDER — LACTATED RINGERS IV SOLN: INTRAVENOUS | Status: DC

## 2022-03-30 MED ORDER — IOHEXOL 350 MG/ML SOLN
75.0000 mL | Freq: Once | INTRAVENOUS | Status: AC | PRN
Start: 2022-03-30 — End: 2022-03-30
  Administered 2022-03-30: 75 mL via INTRAVENOUS

## 2022-03-30 MED ORDER — SODIUM CHLORIDE 0.9 % IV BOLUS (SEPSIS)
1000.0000 mL | Freq: Once | INTRAVENOUS | Status: AC
  Administered 2022-03-30: 1000 mL via INTRAVENOUS

## 2022-03-30 MED ORDER — AMOXICILLIN-POT CLAVULANATE 875-125 MG PO TABS: 1.0000 | ORAL_TABLET | Freq: Two times a day (BID) | ORAL | 0 refills | Status: AC

## 2022-03-30 MED ORDER — IBUPROFEN 800 MG PO TABS
800.0000 mg | ORAL_TABLET | Freq: Once | ORAL | Status: AC
  Administered 2022-03-30: 800 mg via ORAL
  Filled 2022-03-30: qty 1

## 2022-03-30 NOTE — ED Provider Notes (Addendum)
12:45 AM  Assumed care of patient at shift change.  Patient here for increasing abdominal pain after she had a splenic artery aneurysm coiled.  She was told that she would have splenic infarct and have abdominal pain but she felt like her pain medication at home was not controlling the pain.  Here her work-up shows a new leukocytosis of 14,000.  She has been tachycardic which initially was thought due to pain but now her pain is well controlled after morphine and Dilaudid and she is still tachycardic to 118.  Repeat oral temperature is 99.7.  I wonder if she has a fever and that is why she is tachycardic.  We will check a rectal temperature.  Lactic earlier today was normal at 0.8.  She denies any cough, chest pain.  States she does have some shortness of breath due to the abdominal pain.  No vomiting, diarrhea, urinary symptoms.  No numbness, tingling in her legs.  Her catheterization site in the right femoral area is healed and there is no ecchymosis, redness, warmth, bleeding or drainage.  Lower extremities are warm well perfused without discoloration, numbness.  12:55 AM  Pt's rectal temperature is 100.3.  She is nontoxic in appearance and denies any infectious symptoms but will add on a chest x-ray, urinalysis and COVID swab.   I suspect that her heart rate will improve once her temperature has improved.  Will give Tylenol here.  Patient states it is not abnormal for her heart rate to run high but on review of her records it looks like her heart rate is normally in the 70s to low 100s.   1:45 AM  Pt's urinalysis shows no sign of infection.  COVID swab negative.  Chest x-ray reviewed/interpreted by myself and radiologist and shows left lower lobe pneumonia.  Will give antibiotics here for healthcare associated pneumonia and obtain blood cultures.  She is still quite tachycardic and tachypneic.  Heart rate of 127 and respiratory rate of 32.  She does tell me that she has felt short of breath but attributed  this to pain in her left upper quadrant.  We did discuss that she is also at risk for PE given recent surgery and hospitalization.  No previous history of PE or DVT.  Discussed with radiology and they report we are able to proceed with low-dose contrast given normal renal function at this time.  Will obtain CTA to rule out pulmonary infarct, PE.     2:30 AM  Pt's CTA reviewed by myself and radiologist and shows left pleural effusion and infiltrate but no pulmonary embolus.  Will discuss with medicine for admission for HCAP, SIRS.   2:43 AM  Consulted and discussed patient's case with hospitalist, Dr. Damita Dunnings.  I have recommended admission and consulting physician agrees and will place admission orders.  Patient (and family if present) agree with this plan.   I reviewed all nursing notes, vitals, pertinent previous records.  All labs, EKGs, imaging ordered have been independently reviewed and interpreted by myself.      Kiele Heavrin, Delice Bison, DO 03/30/22 [phone]

## 2022-03-30 NOTE — Progress Notes (Signed)
CODE SEPSIS - PHARMACY COMMUNICATION  **Broad Spectrum Antibiotics should be administered within 1 hour of Sepsis diagnosis**  Time Code Sepsis Called/Page Received: 6/11 @ 0234   Antibiotics Ordered: Vanc, Cefepime   Time of 1st antibiotic administration: 6/11 @ 0249   Additional action taken by pharmacy:   If necessary, Name of Provider/Nurse Contacted:     Pedram Goodchild D ,PharmD Clinical Pharmacist  03/30/2022  3:07 AM

## 2022-03-30 NOTE — Assessment & Plan Note (Signed)
Patient being worked up for future bariatric surgery

## 2022-03-30 NOTE — Clinical Social Work Note (Signed)
  Transition of Care [facility]) Screening Note   Patient Details  Name: Sheri Malone Date of Birth: November 13, 1985   Transition of Care [facility] CM/SW Contact:    Eileen Stanford, LCSW Phone [phone] 03/30/2022, 9:15 AM    Transition of Care Department [facility]) has reviewed patient and no TOC needs have been identified at this time. We will continue to monitor patient advancement through interdisciplinary progression rounds. If new patient transition needs arise, please place a TOC consult.

## 2022-03-30 NOTE — H&P (Signed)
History and Physical    Patient: Sheri Malone QTM:[id number] DOB: 04/14/1986 DOA: 03/29/2022 DOS: the patient was seen and examined on 03/30/2022 PCP: Danelle Berry, PA-C  Patient coming from: Home  Chief Complaint:  Chief Complaint  Patient presents with   Post-op Problem    HPI: Sheri Malone is a 36 y.o. 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  The pain has been inadequately relieved with the pain meds that she received following her procedure.  She denies cough, fever, chills, diarrhea or dysuria and denies nausea or vomiting. ED course and data review: Tmax 100.3 with pulse up to 120 tachypnea to 31 with SBP in the 130s to 140s.  Labs with WBC 14,600, lactic acid 0.8 hemoglobin 10.9, down from 12.5 09/20/2021.  CMP unremarkable.  Procalcitonin pending urinalysis unremarkable.   CT abdomen and pelvis shows the following: IMPRESSION: Status post splenic artery aneurysm coiling. Large wedge-shaped low-density areas throughout the spleen compatible with splenic infarcts. This involves proximally half of the splenic volume.  Bibasilar atelectasis.  Trace left pleural effusion CTA chest PE protocol shows the following IMPRESSION: 1. No CT evidence of pulmonary artery embolus. 2. Bibasilar atelectasis or infiltrate. Trace left pleural effusion. Patient started on IV sepsis fluids cefepime and vancomycin for left lower lobe pneumonia and hospitalist consulted for admission.   Review of Systems: As mentioned in the history of present illness. All other systems reviewed and are negative.  Past Medical History:  Diagnosis  Date   Abnormal Pap smear    Asthma    GERD (gastroesophageal reflux disease)    Gestational HTN    Headache(784.0)    History of ectopic pregnancy    History of gestational diabetes    Low hemoglobin    on ironspan   Rhinitis    Vaginal Pap smear, abnormal    Past Surgical History:  Procedure Laterality Date   CESAREAN SECTION N/A 05/20/2013   Procedure: CESAREAN SECTION;  Surgeon: Genia Del, MD;  Location: [facility] ORS;  Service: Obstetrics;  Laterality: N/A;  primary   CESAREAN SECTION N/A 02/04/2018   Procedure: Repeat CESAREAN SECTION;  Surgeon: Noland Fordyce, MD;  Location: [facility] BIRTHING SUITES;  Service: Obstetrics;  Laterality: N/A;  EDD: 02/16/18   CHOLECYSTECTOMY N/A 09/28/2018   Procedure: LAPAROSCOPIC CHOLECYSTECTOMY;  Surgeon: Abigail Miyamoto, MD;  Location: [facility] OR;  Service: General;  Laterality: N/A;   DILATION AND CURETTAGE OF UTERUS  2009   SALPINGOOPHORECTOMY  2009   left   UNILATERAL SALPINGECTOMY Right 02/04/2018   Procedure: UNILATERAL SALPINGECTOMY;  Surgeon: Noland Fordyce, MD;  Location: [facility] BIRTHING SUITES;  Service: Obstetrics;  Laterality: Right;   VISCERAL ARTERY INTERVENTION N/A 03/25/2022   Procedure: VISCERAL ARTERY INTERVENTION;  Surgeon: Renford Dills, MD;  Location: [facility] INVASIVE CV LAB;  Service: Cardiovascular;  Laterality: N/A;   WISDOM TOOTH EXTRACTION     Social History:  reports that she has never smoked. She has never used smokeless tobacco. She reports current alcohol use. She reports that she does not use drugs.  Allergies  Allergen Reactions   Dust Mite Mixed Allergen Ext [Mite (D. Farinae)] Anaphylaxis   Other Anaphylaxis and Other (See  Comments)    DUST ROACH    Family History  Problem Relation Age of Onset   Migraines Mother    Hypertension Mother    Breast cancer Mother    Diabetes Father    Hypertension Father    Multiple sclerosis Sister    Cancer Maternal Grandfather        prostate   Heart disease Paternal  Grandfather    Stomach cancer Neg Hx    Rectal cancer Neg Hx    Esophageal cancer Neg Hx    Colon cancer Neg Hx     Prior to Admission medications   Medication Sig Start Date End Date Taking? Authorizing Provider  oxyCODONE-acetaminophen (PERCOCET) 5-325 MG tablet Take 1 tablet by mouth every 6 (six) hours as needed for moderate pain. 03/29/22  Yes Minna Antis, MD  albuterol (PROVENTIL HFA;VENTOLIN HFA) 108 (90 Base) MCG/ACT inhaler Inhale 1 puff into the lungs every 6 (six) hours as needed for wheezing or shortness of breath.    [provider]  azelastine (ASTELIN) 0.1 % nasal spray INHALE 1 2 PUFFS IN EACH NOSTRIL TWICE A DAY 02/25/19   [provider]  azelastine (OPTIVAR) 0.05 % ophthalmic solution PUT 1 DROP INTO AFFECTED EYE 2 TIMES A DAY 02/25/19   [provider]  EPINEPHrine 0.3 mg/0.3 mL IJ SOAJ injection epinephrine 0.3 mg/0.3 mL injection, auto-injector  INJECT 0.3 MLS (0.3 MG TOTAL) INTO THE MUSCLE ONCE FOR 1 DOSE.    [provider]  fluticasone (FLONASE) 50 MCG/ACT nasal spray Place into the nose. 10/21/21   [provider]  ipratropium-albuterol (DUONEB) 0.5-2.5 (3) MG/3ML SOLN Take 3 mLs by nebulization every 6 (six) hours as needed (for wheeze, coughing spells, chest tightness, SOB). 10/05/19   Danelle Berry, PA-C  levocetirizine (XYZAL) 5 MG tablet Take by mouth.    [provider]  meclizine (ANTIVERT) 25 MG tablet Take 0.5-1 tablets (12.5-25 mg total) by mouth 3 (three) times daily. Patient not taking: Reported on 03/25/2022 11/22/21   Anson Fret, MD  meloxicam (MOBIC) 7.5 MG tablet Take 7.5 mg by mouth 2 (two) times daily. Patient not taking: Reported on 03/25/2022 04/18/21   [provider]  methylPREDNISolone (MEDROL DOSEPAK) 4 MG TBPK tablet Take pills daily all together with food. Take the first dose (6 pills) as soon as possible. Take the rest each morning. For 6 days total 6-5-4-3-2-1. 11/27/21   Anson Fret, MD  montelukast (SINGULAIR) 10 MG tablet Take by mouth.    [provider]  norethindrone-ethinyl estradiol (JUNEL 1/20) 1-20 MG-MCG tablet Take 1 tablet by mouth daily. Patient not taking: Reported on 03/25/2022 02/28/22   Danelle Berry, PA-C  ondansetron (ZOFRAN) 4 MG tablet Take 1 tablet (4 mg total) by mouth every 8 (eight) hours as needed for nausea or vomiting. 03/25/22   Schnier, Latina Craver, MD  ondansetron (ZOFRAN-ODT) 4 MG disintegrating tablet Take 1 tablet (4 mg total) by mouth every 8 (eight) hours as needed. Patient not taking: Reported on 03/25/2022 11/22/21   Anson Fret, MD  pantoprazole (PROTONIX) 40 MG tablet TAKE 1 TABLET (40 MG TOTAL) BY MOUTH IN THE MORNING. 12/04/21   Berniece Salines, FNP  SUMAtriptan (IMITREX) 100 MG tablet Take 1 tablet (100 mg total) by mouth once as needed for up to 2 doses. May repeat in 2 hours if headache persists or recurs. 11/22/21   Anson Fret, MD  SYMBICORT 160-4.5 MCG/ACT inhaler INHALE 2 PUFFS INTO THE LUNGS  IN THE MORNING AND AT BEDTIME. 07/10/21   Danelle Berryapia, Leisa, PA-C    Physical Exam: Vitals:   03/30/22 0130 03/30/22 0215 03/30/22 0215 03/30/22 0236  BP: 127/88 (!) 142/96    Pulse: (!) 118 (!) 120    Resp: (!) 27 (!) 24    Temp:    98.9 F (37.2 C)  TempSrc:    Oral  SpO2: 96% 96%    Weight:   102.6 kg    Physical Exam Vitals and nursing note reviewed.  Constitutional:      General: She is not in acute distress. HENT:     Head: Normocephalic and atraumatic.  Cardiovascular:     Rate and Rhythm: Normal rate and regular rhythm.     Heart sounds: Normal heart sounds.  Pulmonary:     Effort: Pulmonary effort is normal.     Breath sounds: Normal breath sounds.  Abdominal:     Palpations: Abdomen is soft.     Tenderness: There is no abdominal tenderness.  Neurological:     Mental Status: Mental status is at baseline.     Labs on Admission: I have personally reviewed following labs and imaging  studies  CBC: Recent Labs  Lab 03/29/22 2105  WBC 14.6*  HGB 10.9*  HCT 35.0*  MCV 81.4  PLT 437*   Basic Metabolic Panel: Recent Labs  Lab 03/25/22 0932 03/29/22 2105  NA  --  136  K  --  3.7  CL  --  102  CO2  --  25  GLUCOSE  --  107*  BUN 10 9  CREATININE 0.75 0.74  CALCIUM  --  8.9   GFR: Estimated Creatinine Clearance: 109.1 mL/min (by C-G formula based on SCr of 0.74 mg/dL). Liver Function Tests: Recent Labs  Lab 03/29/22 2105  AST 13*  ALT 12  ALKPHOS 34*  BILITOT 0.6  PROT 7.1  ALBUMIN 3.5   No results for input(s): "LIPASE", "AMYLASE" in the last 168 hours. No results for input(s): "AMMONIA" in the last 168 hours. Coagulation Profile: No results for input(s): "INR", "PROTIME" in the last 168 hours. Cardiac Enzymes: No results for input(s): "CKTOTAL", "CKMB", "CKMBINDEX", "TROPONINI" in the last 168 hours. BNP (last 3 results) No results for input(s): "PROBNP" in the last 8760 hours. HbA1C: No results for input(s): "HGBA1C" in the last 72 hours. CBG: No results for input(s): "GLUCAP" in the last 168 hours. Lipid Profile: No results for input(s): "CHOL", "HDL", "LDLCALC", "TRIG", "CHOLHDL", "LDLDIRECT" in the last 72 hours. Thyroid Function Tests: No results for input(s): "TSH", "T4TOTAL", "FREET4", "T3FREE", "THYROIDAB" in the last 72 hours. Anemia Panel: No results for input(s): "VITAMINB12", "FOLATE", "FERRITIN", "TIBC", "IRON", "RETICCTPCT" in the last 72 hours. Urine analysis:    Component Value Date/Time   COLORURINE STRAW (A) 03/30/2022 0055   APPEARANCEUR CLEAR (A) 03/30/2022 0055   LABSPEC 1.035 (H) 03/30/2022 0055   PHURINE 7.0 03/30/2022 0055   GLUCOSEU NEGATIVE 03/30/2022 0055   HGBUR MODERATE (A) 03/30/2022 0055   BILIRUBINUR NEGATIVE 03/30/2022 0055   KETONESUR 5 (A) 03/30/2022 0055   PROTEINUR NEGATIVE 03/30/2022 0055   UROBILINOGEN 0.2 05/20/2013 0305   NITRITE NEGATIVE 03/30/2022 0055   LEUKOCYTESUR NEGATIVE 03/30/2022  0055    Radiological Exams on Admission: CT Angio Chest PE W and/or Wo Contrast  Result Date: 03/30/2022 CLINICAL DATA:  Concern for pulmonary embolism. EXAM: CT ANGIOGRAPHY CHEST WITH CONTRAST TECHNIQUE: Multidetector CT imaging of the chest was performed using the standard protocol during bolus administration  of intravenous contrast. Multiplanar CT image reconstructions and MIPs were obtained to evaluate the vascular anatomy. RADIATION DOSE REDUCTION: This exam was performed according to the departmental dose-optimization program which includes automated exposure control, adjustment of the mA and/or kV according to patient size and/or use of iterative reconstruction technique. CONTRAST:  75mL OMNIPAQUE IOHEXOL 350 MG/ML SOLN COMPARISON:  Chest radiograph dated 03/30/2022. FINDINGS: Cardiovascular: There is no cardiomegaly or pericardial effusion. The thoracic aorta is unremarkable. The origins of the great vessels of the aortic arch appear patent. No pulmonary artery embolus identified. Mediastinum/Nodes: No hilar or mediastinal adenopathy. The esophagus and the thyroid gland are grossly unremarkable. No mediastinal fluid collection. Lungs/Pleura: Bibasilar linear and streaky atelectasis. Patchy areas of consolidation along the lung bases, left greater right may represent atelectasis or infiltrate. There is a trace left pleural effusion. No pneumothorax. The central airways are patent. Upper Abdomen: Vascular coil with associated artifact in the left upper abdomen. Musculoskeletal: No chest wall abnormality. No acute or significant osseous findings. Review of the MIP images confirms the above findings. IMPRESSION: 1. No CT evidence of pulmonary artery embolus. 2. Bibasilar atelectasis or infiltrate. Trace left pleural effusion. Electronically Signed   By: Elgie Collard M.D.   On: 03/30/2022 02:23   DG Chest 2 View  Result Date: 03/30/2022 CLINICAL DATA:  Fever EXAM: CHEST - 2 VIEW COMPARISON:   05/02/2021 FINDINGS: There is a new focus of retrocardiac consolidation at the left lung base. Lungs are otherwise clear. No pleural effusion or pneumothorax. IMPRESSION: Left basilar consolidation, which may indicate pneumonia. Electronically Signed   By: Deatra Robinson M.D.   On: 03/30/2022 01:20   CT ABDOMEN PELVIS W CONTRAST  Result Date: 03/29/2022 CLINICAL DATA:  Left upper quadrant pain status post splenic aneurysm coiling five days ago. EXAM: CT ABDOMEN AND PELVIS WITH CONTRAST TECHNIQUE: Multidetector CT imaging of the abdomen and pelvis was performed using the standard protocol following bolus administration of intravenous contrast. RADIATION DOSE REDUCTION: This exam was performed according to the departmental dose-optimization program which includes automated exposure control, adjustment of the mA and/or kV according to patient size and/or use of iterative reconstruction technique. CONTRAST:  OMNIPAQUE IOHEXOL 300 MG/ML  SOLN COMPARISON:  02/27/2022 FINDINGS: Lower chest: Bibasilar airspace opacities are noted, likely atelectasis. Trace left pleural effusion. Hepatobiliary: No focal liver abnormality is seen. Status post cholecystectomy. No biliary dilatation. Pancreas: No focal abnormality or ductal dilatation. Spleen: Large low-density wedge-shaped areas within the spleen compatible with splenic infarcts status post splenic artery aneurysm coiling. Adrenals/Urinary Tract: No adrenal abnormality. No focal renal abnormality. No stones or hydronephrosis. Urinary bladder is unremarkable. Stomach/Bowel: Normal appendix. Stomach, large and small bowel grossly unremarkable. Vascular/Lymphatic: No evidence of aneurysm or adenopathy. Aneurysm coils noted in the left upper quadrant compatible with splenic artery aneurysm coiling. Reproductive: Uterus and adnexa unremarkable.  No mass. Other: No free fluid or free air. Musculoskeletal: No acute bony abnormality. IMPRESSION: Status post splenic artery  aneurysm coiling. Large wedge-shaped low-density areas throughout the spleen compatible with splenic infarcts. This involves proximally half of the splenic volume. Bibasilar atelectasis.  Trace left pleural effusion. Electronically Signed   By: Charlett Nose M.D.   On: 03/29/2022 23:20     Data Reviewed: Relevant notes from primary care and specialist visits, past discharge summaries as available in EHR, including Care Everywhere. Prior diagnostic testing as pertinent to current admission diagnoses Updated medications and problem lists for reconciliation ED course, including vitals, labs, imaging, treatment and response to  treatment Triage notes, nursing and pharmacy notes and ED provider's notes Notable results as noted in HPI   Assessment and Plan: * Left lower lobe pneumonia SIRS, possible sepsis Patient with fever, tachycardia, tachypnea, leukocytosis with small pleural effusion and infiltrate/atelectasis left lower lobe in the setting of recent procedure of spleen Received IV fluid boluses in the ED We treat as HCAP with cefepime and vancomycin Follow procalcitonin currently evaluate for sepsis or pneumonia versus reaction to recent procedure  S/P coil embolization of splenic artery aneurysm 03/25/22 No acute complication seen on CT Pain control  Gastro-esophageal reflux disease without esophagitis Continue Protonix  Obesity, Class III, BMI 40-49.9 (morbid obesity) (HCC) Patient being worked up for future bariatric surgery  Mild intermittent asthma Albuterol as needed      DVT prophylaxis: Lovenox  Consults: none  Advance Care Planning:   Code Status: Prior   Family Communication: none  Disposition Plan: Back to previous home environment  Severity of Illness: The appropriate patient status for this patient is OBSERVATION. Observation status is judged to be reasonable and necessary in order to provide the required intensity of service to ensure the patient's safety.  The patient's presenting symptoms, physical exam findings, and initial radiographic and laboratory data in the context of their medical condition is felt to place them at decreased risk for further clinical deterioration. Furthermore, it is anticipated that the patient will be medically stable for discharge from the hospital within 2 midnights of admission.   Author: Andris Baumann, MD 03/30/2022 2:59 AM  For on call review [email or web address].

## 2022-03-30 NOTE — Discharge Summary (Signed)
Physician Discharge Summary   KENYETTE SANFT  female DOB: 31-Jul-1986  [id number]  PCP: [name], PA-C  Admit date: 03/29/2022 Discharge date: 03/30/2022  Admitted From: home Disposition:  home CODE STATUS: Full code  Hospital Course:  For full details, please see H&P, progress notes, consult notes and ancillary notes.  Briefly,  Sheri Malone is a 36 y.o. 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  The pain has been inadequately relieved with the pain meds that she received following her procedure.   * Left lower lobe pneumonia, ruled out Sepsis, ruled out --temp 100.3, tachycardia, leukocytosis can be due to inflammatory response from splenic infarct.  CT chest showed bibasilar atelectasis or infiltrate, however, pt reported no respiratory symptoms, no cough and no hypoxia.  Procal neg. --Pt was started on vanc and cefepime on presentation which were not continued.   --pt was discharged on 5 days of empiric Augmentin (also for ppx tx of abdominal infection) and advised to follow up with PCP for repeat CXR.   S/P coil embolization of splenic artery aneurysm [id number] No acute complication seen on CT.  Pain was much improved the next day and pt didn't need IV opioids.  Pt was discharged home with Percocet. Pt was discharged on 5 days of Augmentin for ppx tx of abdominal infection.     Gastro-esophageal reflux disease without esophagitis Continue home Protonix   Obesity, Class III, BMI 40-49.9 (morbid obesity) ([place]) Patient being worked up for future bariatric surgery   Mild intermittent asthma Cont home Sumbicort  and DuoNeb PRN   Discharge Diagnoses:  Principal Problem:   Left lower lobe pneumonia Active Problems:   SIRS, possible sepsis (systemic inflammatory response syndrome) (HCC)   S/P coil embolization of splenic artery aneurysm 03/25/22   Mild intermittent asthma   Obesity, Class III, BMI 40-49.9 (morbid obesity) ([place])   Gastro-esophageal reflux disease without esophagitis     Discharge Instructions:  Allergies as of 03/30/2022       Reactions   Dust Mite Mixed Allergen Ext [mite (d. Farinae)] Anaphylaxis   Other Anaphylaxis, Other (See Comments)   DUST ROACH        Medication List     STOP taking these medications    meclizine 25 MG tablet Commonly known as: ANTIVERT   meloxicam 7.5 MG tablet Commonly known as: MOBIC   methylPREDNISolone 4 MG Tbpk tablet Commonly known as: MEDROL DOSEPAK   norethindrone-ethinyl estradiol 1-20 MG-MCG tablet Commonly known as: Junel 1/20   ondansetron 4 MG disintegrating tablet Commonly known as: ZOFRAN-ODT       TAKE these medications    albuterol 108 (90 Base) MCG/ACT inhaler Commonly known as: VENTOLIN HFA Inhale 1 puff into the lungs every 6 (six) hours as needed for wheezing or shortness of breath.   amoxicillin-clavulanate 875-125 MG tablet Commonly known as: AUGMENTIN Take 1 tablet by mouth 2 (two) times daily for 5 days.   azelastine 0.05 % ophthalmic solution Commonly known as: OPTIVAR PUT 1 DROP INTO AFFECTED EYE 2 TIMES A DAY   azelastine 0.1 % nasal spray Commonly known as: ASTELIN INHALE 1 2 PUFFS IN EACH NOSTRIL TWICE A  DAY   EPINEPHrine 0.3 mg/0.3 mL Soaj injection Commonly known as: EPI-PEN epinephrine 0.3 mg/0.3 mL injection, auto-injector  INJECT 0.3 MLS (0.3 MG TOTAL) INTO THE MUSCLE ONCE FOR 1 DOSE.   fluticasone 50 MCG/ACT nasal spray Commonly known as: FLONASE Place into the nose.   ipratropium-albuterol 0.5-2.5 (3) MG/3ML Soln Commonly known as: DUONEB Take 3 mLs by nebulization every 6  (six) hours as needed (for wheeze, coughing spells, chest tightness, SOB).   levocetirizine 5 MG tablet Commonly known as: XYZAL Take by mouth.   montelukast 10 MG tablet Commonly known as: SINGULAIR Take by mouth.   ondansetron 4 MG tablet Commonly known as: Zofran Take 1 tablet (4 mg total) by mouth every 8 (eight) hours as needed for nausea or vomiting.   oxyCODONE-acetaminophen 5-325 MG tablet Commonly known as: Percocet Take 1 tablet by mouth every 6 (six) hours as needed for moderate pain. What changed:  how much to take reasons to take this   pantoprazole 40 MG tablet Commonly known as: PROTONIX TAKE 1 TABLET (40 MG TOTAL) BY MOUTH IN THE MORNING.   SUMAtriptan 100 MG tablet Commonly known as: IMITREX Take 1 tablet (100 mg total) by mouth once as needed for up to 2 doses. May repeat in 2 hours if headache persists or recurs.   Symbicort 160-4.5 MCG/ACT inhaler Generic drug: budesonide-formoterol INHALE 2 PUFFS INTO THE LUNGS IN THE MORNING AND AT BEDTIME.         Follow-up Information     Schnier, Dolores Lory, MD. Schedule an appointment as soon as possible for a visit .   Specialties: Vascular Surgery, Cardiology, Radiology, Vascular Surgery Contact information: [place] [address] [phone]         [name], PA-C Follow up in 1 week(s).   Specialty: Family Medicine Contact information: [address] [place] [address] [phone]                 Allergies  Allergen Reactions   Dust Mite Mixed Allergen Ext [Mite (D. Farinae)] Anaphylaxis   Other Anaphylaxis and Other (See Comments)    DUST ROACH     The results of significant diagnostics from this hospitalization (including imaging, microbiology, ancillary and laboratory) are listed below for reference.   Consultations:   Procedures/Studies: CT Angio Chest PE W and/or Wo Contrast  Result Date: 03/30/2022 CLINICAL DATA:  Concern for  pulmonary embolism. EXAM: CT ANGIOGRAPHY CHEST WITH CONTRAST TECHNIQUE: Multidetector CT imaging of the chest was performed using the standard protocol during bolus administration of intravenous contrast. Multiplanar CT image reconstructions and MIPs were obtained to evaluate the vascular anatomy. RADIATION DOSE REDUCTION: This exam was performed according to the departmental dose-optimization program which includes automated exposure control, adjustment of the mA and/or kV according to patient size and/or use of iterative reconstruction technique. CONTRAST:  34mL OMNIPAQUE IOHEXOL 350 MG/ML SOLN COMPARISON:  Chest radiograph dated 03/30/2022. FINDINGS: Cardiovascular: There is no cardiomegaly or pericardial effusion. The thoracic aorta is unremarkable. The origins of the great vessels of the aortic arch appear patent. No pulmonary artery embolus identified. Mediastinum/Nodes: No hilar or mediastinal adenopathy. The esophagus and the thyroid gland are grossly unremarkable. No mediastinal fluid collection. Lungs/Pleura: Bibasilar linear and streaky atelectasis. Patchy areas of consolidation along the lung bases, left greater right may represent atelectasis or infiltrate. There is a trace left pleural effusion. No pneumothorax. The central airways are patent. Upper Abdomen: Vascular coil with associated artifact in the left upper  abdomen. Musculoskeletal: No chest wall abnormality. No acute or significant osseous findings. Review of the MIP images confirms the above findings. IMPRESSION: 1. No CT evidence of pulmonary artery embolus. 2. Bibasilar atelectasis or infiltrate. Trace left pleural effusion. Electronically Signed   By: Anner Crete M.D.   On: 03/30/2022 02:23   DG Chest 2 View  Result Date: 03/30/2022 CLINICAL DATA:  Fever EXAM: CHEST - 2 VIEW COMPARISON:  05/02/2021 FINDINGS: There is a new focus of retrocardiac consolidation at the left lung base. Lungs are otherwise clear. No pleural effusion or  pneumothorax. IMPRESSION: Left basilar consolidation, which may indicate pneumonia. Electronically Signed   By: Ulyses Jarred M.D.   On: 03/30/2022 01:20   CT ABDOMEN PELVIS W CONTRAST  Result Date: 03/29/2022 CLINICAL DATA:  Left upper quadrant pain status post splenic aneurysm coiling five days ago. EXAM: CT ABDOMEN AND PELVIS WITH CONTRAST TECHNIQUE: Multidetector CT imaging of the abdomen and pelvis was performed using the standard protocol following bolus administration of intravenous contrast. RADIATION DOSE REDUCTION: This exam was performed according to the departmental dose-optimization program which includes automated exposure control, adjustment of the mA and/or kV according to patient size and/or use of iterative reconstruction technique. CONTRAST:  19mL OMNIPAQUE IOHEXOL 300 MG/ML  SOLN COMPARISON:  02/27/2022 FINDINGS: Lower chest: Bibasilar airspace opacities are noted, likely atelectasis. Trace left pleural effusion. Hepatobiliary: No focal liver abnormality is seen. Status post cholecystectomy. No biliary dilatation. Pancreas: No focal abnormality or ductal dilatation. Spleen: Large low-density wedge-shaped areas within the spleen compatible with splenic infarcts status post splenic artery aneurysm coiling. Adrenals/Urinary Tract: No adrenal abnormality. No focal renal abnormality. No stones or hydronephrosis. Urinary bladder is unremarkable. Stomach/Bowel: Normal appendix. Stomach, large and small bowel grossly unremarkable. Vascular/Lymphatic: No evidence of aneurysm or adenopathy. Aneurysm coils noted in the left upper quadrant compatible with splenic artery aneurysm coiling. Reproductive: Uterus and adnexa unremarkable.  No mass. Other: No free fluid or free air. Musculoskeletal: No acute bony abnormality. IMPRESSION: Status post splenic artery aneurysm coiling. Large wedge-shaped low-density areas throughout the spleen compatible with splenic infarcts. This involves proximally half of the  splenic volume. Bibasilar atelectasis.  Trace left pleural effusion. Electronically Signed   By: Rolm Baptise M.D.   On: 03/29/2022 23:20   PERIPHERAL VASCULAR CATHETERIZATION  Result Date: 03/25/2022 See surgical note for result.  VAS [name] MESENTERIC  Result Date: 03/13/2022 ABDOMINAL VISCERAL Patient Name:  JOELLEN YAMANE  Date of Exam:   03/10/2022 Medical Rec #: [id number]        Accession #:    [id number] Date of Birth: 1985-12-21        Patient Gender: F Patient Age:   32 years Exam Location:  [facility] Procedure:      VAS [name] MESENTERIC Referring Phys: [id number] [place] -------------------------------------------------------------------------------- Indications: Splenic artery aneurysm Limitations: Air/bowel gas. Comparison Study: CT on 02/27/22 demonstrated 2.3 x 2.3cm splenic artery aneurysm Performing Technologist: Blondell Reveal RT, RDMS, RVT  Examination Guidelines: A complete evaluation includes B-mode imaging, spectral Doppler, color Doppler, and power Doppler as needed of all accessible portions of each vessel. Bilateral testing is considered an integral part of a complete examination. Limited examinations for reoccurring indications may be performed as noted.  Duplex Findings: +--------------------+--------+--------+------+--------------+ Mesenteric          PSV cm/sEDV cm/sPlaque   Comments    +--------------------+--------+--------+------+--------------+ Aorta Prox            125                                +--------------------+--------+--------+------+--------------+  Celiac Artery Origin  197                                +--------------------+--------+--------+------+--------------+ SMA Proximal          171      25                        +--------------------+--------+--------+------+--------------+ SMA Mid               180      26                        +--------------------+--------+--------+------+--------------+ CHA                    158      45                        +--------------------+--------+--------+------+--------------+ Splenic                94      31                        +--------------------+--------+--------+------+--------------+ IMA                                       not visualized +--------------------+--------+--------+------+--------------+  Summary: Mesenteric:  Patent celiac, SMA, hepatic and splenic arteries with no significant stenosis. Focally dilated 2.3 x 2.4cm vessel near the splenic hilum with turbulent arterial flow. This finding appear to correlate with a splenic artery aneurysm noted on previous CT.  *See table(s) above for measurements and observations.  Diagnosing physician: [name] MD  Electronically signed by [name] MD on 03/13/2022 at 7:15:29 PM.    Final       Labs: BNP (last 3 results) No results for input(s): "BNP" in the last 8760 hours. Basic Metabolic Panel: Recent Labs  Lab 03/25/22 0932 03/29/22 2105 03/30/22 0533  NA  --  136  --   K  --  3.7  --   CL  --  102  --   CO2  --  25  --   GLUCOSE  --  107*  --   BUN 10 9  --   CREATININE 0.75 0.74 0.76  CALCIUM  --  8.9  --    Liver Function Tests: Recent Labs  Lab 03/29/22 2105  AST 13*  ALT 12  ALKPHOS 34*  BILITOT 0.6  PROT 7.1  ALBUMIN 3.5   No results for input(s): "LIPASE", "AMYLASE" in the last 168 hours. No results for input(s): "AMMONIA" in the last 168 hours. CBC: Recent Labs  Lab 03/29/22 2105 03/30/22 0533  WBC 14.6* 10.8*  HGB 10.9* 9.5*  HCT 35.0* 30.3*  MCV 81.4 80.6  PLT 437* 389   Cardiac Enzymes: No results for input(s): "CKTOTAL", "CKMB", "CKMBINDEX", "TROPONINI" in the last 168 hours. BNP: Invalid input(s): "POCBNP" CBG: No results for input(s): "GLUCAP" in the last 168 hours. D-Dimer No results for input(s): "DDIMER" in the last 72 hours. Hgb A1c No results for input(s): "HGBA1C" in the last 72 hours. Lipid Profile No results for input(s):  "CHOL", "HDL", "LDLCALC", "TRIG", "CHOLHDL", "LDLDIRECT" in the last 72 hours. Thyroid function studies No results for input(s): "  TSH", "T4TOTAL", "T3FREE", "THYROIDAB" in the last 72 hours.  Invalid input(s): "FREET3" Anemia work up No results for input(s): "VITAMINB12", "FOLATE", "FERRITIN", "TIBC", "IRON", "RETICCTPCT" in the last 72 hours. Urinalysis    Component Value Date/Time   COLORURINE STRAW (A) 03/30/2022 0055   APPEARANCEUR CLEAR (A) 03/30/2022 0055   LABSPEC 1.035 (H) 03/30/2022 0055   PHURINE 7.0 03/30/2022 0055   GLUCOSEU NEGATIVE 03/30/2022 0055   HGBUR MODERATE (A) 03/30/2022 0055   BILIRUBINUR NEGATIVE 03/30/2022 0055   KETONESUR 5 (A) 03/30/2022 0055   PROTEINUR NEGATIVE 03/30/2022 0055   UROBILINOGEN 0.2 05/20/2013 0305   NITRITE NEGATIVE 03/30/2022 0055   LEUKOCYTESUR NEGATIVE 03/30/2022 0055   Sepsis Labs Recent Labs  Lab 03/29/22 2105 03/30/22 0533  WBC 14.6* 10.8*   Microbiology Recent Results (from the past 240 hour(s))  SARS Coronavirus 2 by RT PCR (hospital order, performed in [place] hospital lab) *cepheid single result test* Anterior Nasal Swab     Status: None   Collection Time: 03/30/22 12:56 AM   Specimen: Anterior Nasal Swab  Result Value Ref Range Status   SARS Coronavirus 2 by RT PCR NEGATIVE NEGATIVE Final    Comment: (NOTE) SARS-CoV-2 target nucleic acids are NOT DETECTED.  The SARS-CoV-2 RNA is generally detectable in upper and lower respiratory specimens during the acute phase of infection. The lowest concentration of SARS-CoV-2 viral copies this assay can detect is 250 copies / mL. A negative result does not preclude SARS-CoV-2 infection and should not be used as the sole basis for treatment or other patient management decisions.  A negative result may occur with improper specimen collection / handling, submission of specimen other than nasopharyngeal swab, presence of viral mutation(s) within the areas targeted by this  assay, and inadequate number of viral copies (<250 copies / mL). A negative result must be combined with clinical observations, patient history, and epidemiological information.  Fact Sheet for Patients:   https://www.patel.info/  Fact Sheet for Healthcare Providers: https://hall.com/  This test is not yet approved or  cleared by the [place] FDA and has been authorized for detection and/or diagnosis of SARS-CoV-2 by FDA under an Emergency Use Authorization (EUA).  This EUA will remain in effect (meaning this test can be used) for the duration of the COVID-19 declaration under Section 564(b)(1) of the Act, 21 U.S.C. section 360bbb-3(b)(1), unless the authorization is terminated or revoked sooner.  Performed at [facility], [address] [place], [place] [address]   Blood culture (routine x 2)     Status: None (Preliminary result)   Collection Time: 03/30/22  2:14 AM   Specimen: BLOOD  Result Value Ref Range Status   Specimen Description BLOOD LEFT ANTECUBITAL  Final   Special Requests   Final    BOTTLES DRAWN AEROBIC AND ANAEROBIC Blood Culture results may not be optimal due to an inadequate volume of blood received in culture bottles   Culture   Final    NO GROWTH < 12 HOURS Performed at [facility], [address] [place], [place] [address]    Report Status PENDING  Incomplete  Blood culture (routine x 2)     Status: None (Preliminary result)   Collection Time: 03/30/22  2:14 AM   Specimen: BLOOD  Result Value Ref Range Status   Specimen Description BLOOD RIGHT ANTECUBITAL  Final   Special Requests   Final    BOTTLES DRAWN AEROBIC AND ANAEROBIC Blood Culture adequate volume   Culture   Final  NO GROWTH < 12 HOURS Performed at [facility], [address] [place], [place] [address]    Report Status PENDING  Incomplete     Total time spend on discharging this patient, including the  last patient exam, discussing the hospital stay, instructions for ongoing care as it relates to all pertinent caregivers, as well as preparing the medical discharge records, prescriptions, and/or referrals as applicable, is 40 minutes.    Enzo Bi, MD  [facility] 03/30/2022, 8:59 AM

## 2022-03-30 NOTE — Assessment & Plan Note (Signed)
Albuterol as needed 

## 2022-03-30 NOTE — Progress Notes (Signed)
Anticoagulation monitoring(Lovenox):  36 yo female ordered Lovenox 40 mg Q24h    Filed Weights   03/30/22 0215  Weight: 102.6 kg (226 lb 3.1 oz)   BMI 40    Lab Results  Component Value Date   CREATININE 0.74 03/29/2022   CREATININE 0.75 03/25/2022   CREATININE 0.81 09/20/2021   Estimated Creatinine Clearance: 109.1 mL/min (by C-G formula based on SCr of 0.74 mg/dL). Hemoglobin & Hematocrit     Component Value Date/Time   HGB 10.9 (L) 03/29/2022 2105   HGB 11.1 11/10/2012 0000   HCT 35.0 (L) 03/29/2022 2105   HCT 33 11/10/2012 0000     Per Protocol for Patient with estCrcl > 30 ml/min and BMI > 30, will transition to Lovenox 60 mg Q24h.

## 2022-03-30 NOTE — Plan of Care (Incomplete)
Pt AAOx4, moderate pain when she takes a deep breath and during exertion. VS are stable. IV removed with no complications. AVS and education provided. Pt transported by [facility] to main lobby where family will transport her home.

## 2022-03-30 NOTE — Assessment & Plan Note (Signed)
No acute complication seen on CT Pain control

## 2022-03-30 NOTE — Assessment & Plan Note (Signed)
Continue Protonix °

## 2022-03-30 NOTE — Progress Notes (Signed)
Pharmacy Antibiotic Note  Sheri Malone is a 36 y.o. female admitted on 03/29/2022 with PNA.  Pharmacy has been consulted for Vancomycin, Cefepime dosing.  Plan: Cefepime 2 gm IV X 1 given on 6/11 @ 0234. Cefepime 2 gm IV Q8H ordered to continue 6/11 @ 1030.   Vancomycin 1 gm IV X 1 given on 6/11 @ 0319. Additional Vanc 1500 mg IV X 1 ordered to make total loading dose of 2500 mg. Vancomycin 1500 mg IV Q24H ordered to start on 6/12 @ 0300.  AUC = 439 Vanc trough = 8.1   Height: 5\' 2"  (157.5 cm) Weight: 102 kg (224 lb 13.9 oz) IBW/kg (Calculated) : 50.1  Temp (24hrs), Avg:99.3 F (37.4 C), Min:98.2 F (36.8 C), Max:100.3 F (37.9 C)  Recent Labs  Lab 03/25/22 0932 03/29/22 2105  WBC  --  14.6*  CREATININE 0.75 0.74  LATICACIDVEN  --  0.8    Estimated Creatinine Clearance: 106.7 mL/min (by C-G formula based on SCr of 0.74 mg/dL).    Allergies  Allergen Reactions   Dust Mite Mixed Allergen Ext [Mite (D. Farinae)] Anaphylaxis   Other Anaphylaxis and Other (See Comments)    DUST ROACH    Antimicrobials this admission:   >>    >>   Dose adjustments this admission:   Microbiology results:  BCx:   UCx:    Sputum:    MRSA PCR:   Thank you for allowing pharmacy to be a part of this patient's care.  Jillisa Harris D 03/30/2022 3:47 AM

## 2022-03-30 NOTE — Assessment & Plan Note (Addendum)
SIRS, possible sepsis Patient with fever, tachycardia, tachypnea, leukocytosis with small pleural effusion and infiltrate/atelectasis left lower lobe in the setting of recent procedure of spleen Received IV fluid boluses in the ED We treat as HCAP with cefepime and vancomycin Follow procalcitonin currently evaluate for sepsis or pneumonia versus reaction to recent procedure

## 2022-03-30 NOTE — ED Notes (Signed)
Sandwich tray given to pt at this time per her request.

## 2022-03-30 NOTE — Sepsis Progress Note (Signed)
Elink following code sepsis °

## 2022-03-30 NOTE — Progress Notes (Signed)
PHARMACY -  BRIEF ANTIBIOTIC NOTE   Pharmacy has received consult(s) for Vancomycin from an ED provider.  The patient's profile has been reviewed for ht/wt/allergies/indication/available labs.    One time order(s) placed for Vancomycin 2500 mg IV X 1.   Further antibiotics/pharmacy consults should be ordered by admitting physician if indicated.                       Thank you, Josel Keo D 03/30/2022  2:19 AM

## 2022-03-31 ENCOUNTER — Telehealth: Payer: Self-pay

## 2022-03-31 NOTE — Telephone Encounter (Signed)
Transition Care Management Follow-up Telephone Call Date of discharge and from where: 03/30/2022 How have you been since you were released from the hospital? Still not able to take in a deep breath. Reports using albuterol in nebulizer due to inability to use inhalers.  Reports continues to have wheezing. Denies fever.  Any questions or concerns? Yes  Reports she does not know what to do about work  Items Reviewed: Did the pt receive and understand the discharge instructions provided? Yes  Medications obtained and verified? Yes  Other? No  Any new allergies since your discharge? No  Dietary orders reviewed? Yes Do you have support at home? Yes   Home Care and Equipment/Supplies: Were home health services ordered? no If so, what is the name of the agency?  Has the agency set up a time to come to the patient's home? not applicable Were any new equipment or medical supplies ordered?  No What is the name of the medical supply agency?  Were you able to get the supplies/equipment? not applicable Do you have any questions related to the use of the equipment or supplies? No  Functional Questionnaire: (I = Independent and D = Dependent) ADLs: I  Bathing/Dressing- I  Meal Prep- I  Eating- I  Maintaining continence- I  Transferring/Ambulation- I  Managing Meds- I  Follow up appointments reviewed:  PCP Hospital f/u appt confirmed? Yes  Scheduled to see Danelle Berry on 04/03/2022 Specialist Hospital f/u appt confirmed? Yes  Are transportation arrangements needed?  Reports husband can drive on Tuesday or Thursdays If their condition worsens, is the pt aware to call PCP or go to the Emergency Dept.? Yes Was the patient provided with contact information for the PCP's office or ED? Yes Was to pt encouraged to call back with questions or concerns? Yes   Patient has send a my chart message to MD about work and her admission.  No response as of yet. Patient reports she is not able to use  her symbicort due to not being able to take a depp breath. I placed call to MD office and got appointment move up to tomorrow. Confirmed appointment change with patient.   Rowe Pavy, RN, BSN, CEN [facility] [employer] [phone]

## 2022-04-01 ENCOUNTER — Ambulatory Visit: Payer: 59 | Admitting: Family Medicine

## 2022-04-01 ENCOUNTER — Encounter: Payer: Self-pay | Admitting: Family Medicine

## 2022-04-01 VITALS — BP 124/72 | HR 99 | Temp 98.8°F | Resp 16 | Ht 63.0 in | Wt 219.5 lb

## 2022-04-01 DIAGNOSIS — J189 Pneumonia, unspecified organism: Secondary | ICD-10-CM | POA: Diagnosis not present

## 2022-04-01 DIAGNOSIS — J4521 Mild intermittent asthma with (acute) exacerbation: Secondary | ICD-10-CM | POA: Diagnosis not present

## 2022-04-01 DIAGNOSIS — Z09 Encounter for follow-up examination after completed treatment for conditions other than malignant neoplasm: Secondary | ICD-10-CM

## 2022-04-01 DIAGNOSIS — Z8679 Personal history of other diseases of the circulatory system: Secondary | ICD-10-CM

## 2022-04-01 DIAGNOSIS — R071 Chest pain on breathing: Secondary | ICD-10-CM

## 2022-04-01 DIAGNOSIS — D649 Anemia, unspecified: Secondary | ICD-10-CM

## 2022-04-01 DIAGNOSIS — Z9889 Other specified postprocedural states: Secondary | ICD-10-CM

## 2022-04-01 MED ORDER — BUDESONIDE 0.5 MG/2ML IN SUSP: 0.5000 mg | Freq: Two times a day (BID) | RESPIRATORY_TRACT | 0 refills | Status: DC

## 2022-04-01 MED ORDER — IPRATROPIUM-ALBUTEROL 0.5-2.5 (3) MG/3ML IN SOLN: 3.0000 mL | Freq: Four times a day (QID) | RESPIRATORY_TRACT | 1 refills | Status: DC | PRN

## 2022-04-01 MED ORDER — PREDNISONE 20 MG PO TABS: 40.0000 mg | ORAL_TABLET | Freq: Every day | ORAL | 0 refills | Status: AC

## 2022-04-01 NOTE — Patient Instructions (Signed)
Get the chest xray done the last week of June at outpatient imaging.

## 2022-04-01 NOTE — Progress Notes (Signed)
Patient ID: ANISHKA ALWAN, female    DOB: 16-Oct-1986, 36 y.o.   MRN: [id number]  PCP: [name], PA-C  Chief Complaint  Patient presents with   Hospitalization Follow-up    Pt was dx with pneumonia.    Subjective:   BONNY BELIN is a 36 y.o. 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 Delana Meyer -imaging initially showed the incidental finding October 2022 follow-up scans December 28, 2021 showed the aneurysm did increase in size significantly, she proceeded with angiography and repair with coil immobilization -this was done on 03/25/2022. She experienced chest and abdominal pain much worse with deep inspiration and she presented to the ER for follow-up CT angio was negative for PE but did show bibasilar linear streaky atelectasis patchy areas of consolidation left greater than right and trace left pleural effusion She had mild leukocytosis, tachycardia with tachypnea She was briefly admitted and started antibiotics to cover for community-acquired pneumonia -left lower lobe She got vancomycin & cefepime in the hospital and was discharged with 5 days of Augmentin today is day 2 of 5 Discharge summary also states Augmentin was to cover intra-abdominal infection She was told she could expect abdominal pain for 2 weeks after the procedure and she would have some splenic infarct and atrophy  She presents today with continued pain, shortness of breath and tachycardia She still is having difficulty taking a deep breath, she was not given an incentive spirometer So difficult for her to take a deep breath that she is unable to correctly do her doses of her asthma maintenance inhalers she is feeling short of breath wheezy and having some tightness in her chest.  Instead she has been doing  nebulizer treatments twice a day  She has no worsening abdominal pain or chest pain She is not using pain medicine frequently Her labs from the hospital and ER were reviewed She has follow-up with vascular specialist   Patient Active Problem List   Diagnosis Date Noted   Left lower lobe pneumonia 03/30/2022   SIRS, possible sepsis (systemic inflammatory response syndrome) ([place]) 03/30/2022   S/P coil embolization of splenic artery aneurysm 03/25/22 03/30/2022   Vertigo 11/24/2021   Splenic artery aneurysm ([place]) 08/06/2021   Effusion of right ankle 07/31/2021   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   Iron deficiency anemia 11/15/2020   Slow transit constipation 11/15/2020   Microcytosis 11/15/2020   Arthralgia of right ankle 08/15/2020   Stiffness of right ankle joint 08/15/2020   Closed trimalleolar fracture of right ankle 05/21/2020   Gastro-esophageal reflux disease without esophagitis 04/13/2020   Obesity, Class III, BMI 40-49.9 (morbid obesity) ([place]) 01/20/2020   Vitamin D deficiency 01/20/2020   Prediabetes 01/20/2020   Insulin resistance [id number]   Metabolic syndrome [id number]   Allergic rhinitis 11/23/2016   Allergic conjunctivitis 01/17/2016   Mild intermittent asthma 01/17/2016   Dermatitis, eczematoid 06/21/2015   H/O cesarean section 06/21/2015   History of diabetes mellitus arising in pregnancy 06/21/2015   Headache, migraine 06/21/2015      Current Outpatient Medications:    albuterol (PROVENTIL HFA;VENTOLIN HFA) 108 (90 Base) MCG/ACT inhaler, Inhale 1 puff into the lungs every 6 (six) hours as needed for wheezing or shortness of breath., Disp: , Rfl:    amoxicillin-clavulanate (AUGMENTIN) 875-125 MG tablet, Take 1 tablet  by mouth 2 (two) times daily for 5 days., Disp: 10 tablet, Rfl: 0   azelastine (ASTELIN) 0.1 % nasal spray, INHALE 1 2 PUFFS IN EACH NOSTRIL TWICE A DAY, Disp: , Rfl:    azelastine (OPTIVAR) 0.05 % ophthalmic solution, PUT 1 DROP INTO  AFFECTED EYE 2 TIMES A DAY, Disp: , Rfl:    EPINEPHrine 0.3 mg/0.3 mL IJ SOAJ injection, epinephrine 0.3 mg/0.3 mL injection, auto-injector  INJECT 0.3 MLS (0.3 MG TOTAL) INTO THE MUSCLE ONCE FOR 1 DOSE., Disp: , Rfl:    fluticasone (FLONASE) 50 MCG/ACT nasal spray, Place into the nose., Disp: , Rfl:    ipratropium-albuterol (DUONEB) 0.5-2.5 (3) MG/3ML SOLN, Take 3 mLs by nebulization every 6 (six) hours as needed (for wheeze, coughing spells, chest tightness, SOB)., Disp: 180 mL, Rfl: 1   levocetirizine (XYZAL) 5 MG tablet, Take by mouth., Disp: , Rfl:    montelukast (SINGULAIR) 10 MG tablet, Take by mouth., Disp: , Rfl:    ondansetron (ZOFRAN) 4 MG tablet, Take 1 tablet (4 mg total) by mouth every 8 (eight) hours as needed for nausea or vomiting., Disp: 20 tablet, Rfl: 0   oxyCODONE-acetaminophen (PERCOCET) 5-325 MG tablet, Take 1 tablet by mouth every 6 (six) hours as needed for moderate pain., Disp: 20 tablet, Rfl: 0   pantoprazole (PROTONIX) 40 MG tablet, TAKE 1 TABLET (40 MG TOTAL) BY MOUTH IN THE MORNING., Disp: 30 tablet, Rfl: 5   SUMAtriptan (IMITREX) 100 MG tablet, Take 1 tablet (100 mg total) by mouth once as needed for up to 2 doses. May repeat in 2 hours if headache persists or recurs., Disp: 10 tablet, Rfl: 12   SYMBICORT 160-4.5 MCG/ACT inhaler, INHALE 2 PUFFS INTO THE LUNGS IN THE MORNING AND AT BEDTIME., Disp: 10.2 each, Rfl: 1   Allergies  Allergen Reactions   Dust Mite Mixed Allergen Ext [Mite (D. Farinae)] Anaphylaxis   Other Anaphylaxis and Other (See Comments)    DUST ROACH     Social History   Tobacco Use   Smoking status: Never   Smokeless tobacco: Never  Vaping Use   Vaping Use: Never used  Substance Use Topics   Alcohol use: Yes    Comment: occasionally/once a month   Drug use: No      Chart Review Today: I personally reviewed active problem list, medication list, allergies, family history, social history, health maintenance, notes from last encounter,  lab results, imaging with the patient/caregiver today.   Review of Systems  Constitutional: Negative.   HENT: Negative.    Eyes: Negative.   Respiratory: Negative.    Cardiovascular: Negative.   Gastrointestinal: Negative.   Endocrine: Negative.   Genitourinary: Negative.   Musculoskeletal: Negative.   Skin: Negative.   Allergic/Immunologic: Negative.   Neurological: Negative.   Hematological: Negative.   Psychiatric/Behavioral: Negative.    All other systems reviewed and are negative.      Objective:   Vitals:   04/01/22 0934 04/01/22 0937  BP: 124/72   Pulse: (!) 102 99  Resp: 16   Temp: 98.8 F (37.1 C)   TempSrc: Oral   SpO2: 98%   Weight: 219 lb 8 oz (99.6 kg)   Height: 5\' 3"  (1.6 m)     Body mass index is 38.88 kg/m.  Physical Exam Vitals and nursing note reviewed.  Constitutional:      General: She is not in acute distress.    Appearance: Normal appearance. She is well-developed. She is not ill-appearing, toxic-appearing or diaphoretic.  HENT:     Head: Normocephalic and atraumatic.     Nose: Nose normal.  Eyes:     General:        Right eye: No discharge.        Left eye: No discharge.     Conjunctiva/sclera: Conjunctivae normal.  Neck:     Trachea: No tracheal deviation.  Cardiovascular:     Rate and Rhythm: Regular rhythm. Tachycardia present.     Pulses: Normal pulses.     Heart sounds: Normal heart sounds. No murmur heard.    No friction rub. No gallop.  Pulmonary:     Effort: Pulmonary effort is normal. No tachypnea, accessory muscle usage, respiratory distress or retractions.     Breath sounds: No stridor. Decreased breath sounds present.     Comments: Splinted inspiratory effort very diminished breath sounds throughout Skin:    General: Skin is warm and dry.     Findings: No rash.  Neurological:     Mental Status: She is alert.     Motor: No abnormal muscle tone.     Coordination: Coordination normal.  Psychiatric:        Mood and  Affect: Mood normal.        Behavior: Behavior normal.      Results for orders placed or performed during the hospital encounter of 03/29/22  SARS Coronavirus 2 by RT PCR (hospital order, performed in [place] hospital lab) *cepheid single result test* Anterior Nasal Swab   Specimen: Anterior Nasal Swab  Result Value Ref Range   SARS Coronavirus 2 by RT PCR NEGATIVE NEGATIVE  Blood culture (routine x 2)   Specimen: BLOOD  Result Value Ref Range   Specimen Description BLOOD LEFT ANTECUBITAL    Special Requests      BOTTLES DRAWN AEROBIC AND ANAEROBIC Blood Culture results may not be optimal due to an inadequate volume of blood received in culture bottles   Culture      NO GROWTH 2 DAYS Performed at [facility], [address], [place], [place] [address]    Report Status PENDING   Blood culture (routine x 2)   Specimen: BLOOD  Result Value Ref Range   Specimen Description BLOOD RIGHT ANTECUBITAL    Special Requests      BOTTLES DRAWN AEROBIC AND ANAEROBIC Blood Culture adequate volume   Culture      NO GROWTH 2 DAYS Performed at [facility], [place], [place] [address]    Report Status PENDING   Comprehensive metabolic panel  Result Value Ref Range   Sodium 136 135 - 145 mmol/L   Potassium 3.7 3.5 - 5.1 mmol/L   Chloride 102 98 - 111 mmol/L   CO2 25 22 - 32 mmol/L   Glucose, Bld 107 (H) 70 - 99 mg/dL   BUN 9 6 - 20 mg/dL   Creatinine, Ser 0.74 0.44 - 1.00 mg/dL   Calcium 8.9 8.9 - 10.3 mg/dL   Total Protein 7.1 6.5 - 8.1 g/dL   Albumin 3.5 3.5 - 5.0 g/dL   AST 13 (L) 15 - 41 U/L   ALT 12 0 - 44 U/L   Alkaline Phosphatase 34 (L) 38 - 126 U/L   Total Bilirubin 0.6 0.3 - 1.2 mg/dL   GFR, Estimated >60 >60 mL/min   Anion gap 9 5 - 15  CBC  Result Value Ref Range   WBC 14.6 (H) 4.0 - 10.5 K/uL   RBC 4.30 3.87 -  5.11 MIL/uL   Hemoglobin 10.9 (L) 12.0 - 15.0 g/dL   HCT 35.0 (L) 36.0 - 46.0 %   MCV 81.4 80.0 - 100.0 fL   MCH 25.3  (L) 26.0 - 34.0 pg   MCHC 31.1 30.0 - 36.0 g/dL   RDW 12.9 11.5 - 15.5 %   Platelets 437 (H) 150 - 400 K/uL   nRBC 0.0 0.0 - 0.2 %  Lactic acid, plasma  Result Value Ref Range   Lactic Acid, Venous 0.8 0.5 - 1.9 mmol/L  Urinalysis, Routine w reflex microscopic Anterior Nasal Swab  Result Value Ref Range   Color, Urine STRAW (A) YELLOW   APPearance CLEAR (A) CLEAR   Specific Gravity, Urine 1.035 (H) 1.005 - 1.030   pH 7.0 5.0 - 8.0   Glucose, UA NEGATIVE NEGATIVE mg/dL   Hgb urine dipstick MODERATE (A) NEGATIVE   Bilirubin Urine NEGATIVE NEGATIVE   Ketones, ur 5 (A) NEGATIVE mg/dL   Protein, ur NEGATIVE NEGATIVE mg/dL   Nitrite NEGATIVE NEGATIVE   Leukocytes,Ua NEGATIVE NEGATIVE   RBC / HPF 0-5 0 - 5 RBC/hpf   WBC, UA 0-5 0 - 5 WBC/hpf   Bacteria, UA NONE SEEN NONE SEEN   Squamous Epithelial / LPF 0-5 0 - 5  Procalcitonin - Baseline  Result Value Ref Range   Procalcitonin <0.10 ng/mL  HIV Antibody (routine testing w rflx)  Result Value Ref Range   HIV Screen 4th Generation wRfx Non Reactive Non Reactive  Protime-INR  Result Value Ref Range   Prothrombin Time 15.8 (H) 11.4 - 15.2 seconds   INR 1.3 (H) 0.8 - 1.2  Cortisol-am, blood  Result Value Ref Range   Cortisol - AM 9.9 6.7 - 22.6 ug/dL  CBC  Result Value Ref Range   WBC 10.8 (H) 4.0 - 10.5 K/uL   RBC 3.76 (L) 3.87 - 5.11 MIL/uL   Hemoglobin 9.5 (L) 12.0 - 15.0 g/dL   HCT 30.3 (L) 36.0 - 46.0 %   MCV 80.6 80.0 - 100.0 fL   MCH 25.3 (L) 26.0 - 34.0 pg   MCHC 31.4 30.0 - 36.0 g/dL   RDW 12.9 11.5 - 15.5 %   Platelets 389 150 - 400 K/uL   nRBC 0.0 0.0 - 0.2 %  Creatinine, serum  Result Value Ref Range   Creatinine, Ser 0.76 0.44 - 1.00 mg/dL   GFR, Estimated >60 >60 mL/min  Lactic acid, plasma  Result Value Ref Range   Lactic Acid, Venous 1.2 0.5 - 1.9 mmol/L  Lactic acid, plasma  Result Value Ref Range   Lactic Acid, Venous 1.1 0.5 - 1.9 mmol/L  POC urine preg, ED  Result Value Ref Range   Preg Test, Ur  Negative Negative       Assessment & Plan:      ICD-10-CM   1. Encounter for examination following treatment at hospital  Z09 DG Chest 2 View   Procedures, specialist, ER and hospital notes and results all reviewed    2. Chest pain on breathing  R07.1 ipratropium-albuterol (DUONEB) 0.5-2.5 (3) MG/3ML SOLN    DG Chest 2 View   splinted efforts, PE ruled out, mildly tachycardic, encouraged nebs, cough meds, incentive spirometry, monitoring - back to ER if any worsening    3. Mild intermittent asthma with acute exacerbation  J45.21 ipratropium-albuterol (DUONEB) 0.5-2.5 (3) MG/3ML SOLN    budesonide (PULMICORT) 0.5 MG/2ML nebulizer solution    DG Chest 2 View    predniSONE (DELTASONE) 20 MG tablet  Unable to currently use inhalers we will try ICS nebs with DuoNebs, she does request steroid burst, resume maintenance inhalers ASAP    4. Pneumonia of left lower lobe due to infectious organism  J18.9 DG Chest 2 View   Continue Augmentin and encouraged to take deep breaths (she is familiar with incentive spirometry)    5. S/P coil embolization of splenic artery aneurysm 03/25/22  Z98.890    Z86.79    stable post/op sx, pain is managable, using meds less, she has f/up with vascular     6. Anemia, unspecified type  D64.9    expected with procedure, likely some hemedilution - can do f/up labs in the next 3 months          [name], PA-C 04/01/22 10:02 AM

## 2022-04-03 ENCOUNTER — Inpatient Hospital Stay: Payer: 59 | Admitting: Family Medicine

## 2022-04-03 ENCOUNTER — Ambulatory Visit (INDEPENDENT_AMBULATORY_CARE_PROVIDER_SITE_OTHER): Payer: 59 | Admitting: Vascular Surgery

## 2022-04-03 ENCOUNTER — Encounter (INDEPENDENT_AMBULATORY_CARE_PROVIDER_SITE_OTHER): Payer: Self-pay | Admitting: Vascular Surgery

## 2022-04-03 VITALS — BP 119/81 | HR 90 | Resp 16 | Wt 220.0 lb

## 2022-04-03 DIAGNOSIS — K219 Gastro-esophageal reflux disease without esophagitis: Secondary | ICD-10-CM

## 2022-04-03 DIAGNOSIS — J4520 Mild intermittent asthma, uncomplicated: Secondary | ICD-10-CM

## 2022-04-03 DIAGNOSIS — I728 Aneurysm of other specified arteries: Secondary | ICD-10-CM | POA: Diagnosis not present

## 2022-04-03 NOTE — Progress Notes (Unsigned)
MRN : [id number]  Sheri Malone is a 36 y.o. (26-Sep-1986) female who presents with chief complaint of check circulation.  History of Present Illness:   The patient returns to the office for followup and review status post angiogram with intervention on 03/25/2022.   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 No interval shortening of the patient's claudication distance or rest pain symptoms. No new ulcers or wounds have occurred since the last visit.  There have been no significant changes to the patient's overall health care.  No documented history of amaurosis fugax or recent TIA symptoms. There are no recent neurological changes noted. No documented history of DVT, PE or superficial thrombophlebitis. The patient denies recent episodes of angina or shortness of breath.   ABI's Rt=*** and Lt=***  (previous ABI's Rt=*** and Lt=***) Duplex US of the *** lower extremity arterial system shows ***   No outpatient medications have been marked as taking for the 04/03/22 encounter (Appointment) with Gilda Crease, Latina Craver, MD.    Past Medical History:  Diagnosis Date   Abnormal Pap smear    Asthma    GERD (gastroesophageal reflux disease)    Gestational HTN    Headache(784.0)    History of ectopic pregnancy    History of gestational diabetes    Low hemoglobin    on ironspan   Rhinitis    Vaginal Pap smear, abnormal     Past Surgical History:  Procedure Laterality Date   CESAREAN SECTION N/A 05/20/2013   Procedure: CESAREAN SECTION;  Surgeon: Genia Del, MD;  Location: [facility];  Service: Obstetrics;  Laterality: N/A;  primary   CESAREAN SECTION N/A 02/04/2018   Procedure: Repeat CESAREAN SECTION;  Surgeon: Noland Fordyce, MD;  Location: [facility] BIRTHING SUITES;  Service: Obstetrics;  Laterality: N/A;  EDD: 02/16/18   CHOLECYSTECTOMY N/A 09/28/2018   Procedure: LAPAROSCOPIC CHOLECYSTECTOMY;   Surgeon: Abigail Miyamoto, MD;  Location: [facility] OR;  Service: General;  Laterality: N/A;   DILATION AND CURETTAGE OF UTERUS  2009   SALPINGOOPHORECTOMY  2009   left   UNILATERAL SALPINGECTOMY Right 02/04/2018   Procedure: UNILATERAL SALPINGECTOMY;  Surgeon: Noland Fordyce, MD;  Location: [facility] BIRTHING SUITES;  Service: Obstetrics;  Laterality: Right;   VISCERAL ARTERY INTERVENTION N/A 03/25/2022   Procedure: VISCERAL ARTERY INTERVENTION;  Surgeon: Renford Dills, MD;  Location: [facility] INVASIVE CV LAB;  Service: Cardiovascular;  Laterality: N/A;   WISDOM TOOTH EXTRACTION      Social History Social History   Tobacco Use   Smoking status: Never   Smokeless tobacco: Never  Vaping Use   Vaping Use: Never used  Substance Use Topics   Alcohol use: Yes    Comment: occasionally/once a month   Drug use: No    Family History Family History  Problem Relation Age of Onset   Migraines Mother    Hypertension Mother    Breast cancer Mother    Diabetes Father    Hypertension Father    Multiple sclerosis Sister    Cancer Maternal Grandfather        prostate   Heart disease Paternal Grandfather    Stomach cancer Neg Hx    Rectal cancer Neg Hx    Esophageal cancer Neg Hx    Colon cancer Neg Hx     Allergies  Allergen Reactions   Dust Mite Mixed Allergen Ext [Mite (D. Farinae)] Anaphylaxis   Other Anaphylaxis and  Other (See Comments)    DUST ROACH     REVIEW OF SYSTEMS (Negative unless checked)  Constitutional: [] Weight loss  [] Fever  [] Chills Cardiac: [] Chest pain   [] Chest pressure   [] Palpitations   [] Shortness of breath when laying flat   [] Shortness of breath with exertion. Vascular:  [x] Pain in legs with walking   [] Pain in legs at rest  [] History of DVT   [] Phlebitis   [] Swelling in legs   [] Varicose veins   [] Non-healing ulcers Pulmonary:   [] Uses home oxygen   [] Productive cough   [] Hemoptysis   [] Wheeze  [] COPD   [] Asthma Neurologic:  [] Dizziness   [] Seizures   [] History of  stroke   [] History of TIA  [] Aphasia   [] Vissual changes   [] Weakness or numbness in arm   [] Weakness or numbness in leg Musculoskeletal:   [] Joint swelling   [] Joint pain   [] Low back pain Hematologic:  [] Easy bruising  [] Easy bleeding   [] Hypercoagulable state   [] Anemic Gastrointestinal:  [] Diarrhea   [] Vomiting  [] Gastroesophageal reflux/heartburn   [] Difficulty swallowing. Genitourinary:  [] Chronic kidney disease   [] Difficult urination  [] Frequent urination   [] Blood in urine Skin:  [] Rashes   [] Ulcers  Psychological:  [] History of anxiety   []  History of major depression.  Physical Examination  There were no vitals filed for this visit. There is no height or weight on file to calculate BMI. Gen: WD/WN, NAD Head: [place]/AT, No temporalis wasting.  Ear/Nose/Throat: Hearing grossly intact, nares w/o erythema or drainage Eyes: PER, EOMI, sclera nonicteric.  Neck: Supple, no masses.  No bruit or JVD.  Pulmonary:  Good air movement, no audible wheezing, no use of accessory muscles.  Cardiac: RRR, normal S1, S2, no Murmurs. Vascular:  mild trophic changes, no open wounds Vessel Right Left  Radial Palpable Palpable  PT Not Palpable Not Palpable  DP Not Palpable Not Palpable  Gastrointestinal: soft, non-distended. No guarding/no peritoneal signs.  Musculoskeletal: M/S 5/5 throughout.  No visible deformity.  Neurologic: CN 2-12 intact. Pain and light touch intact in extremities.  Symmetrical.  Speech is fluent. Motor exam as listed above. Psychiatric: Judgment intact, Mood & affect appropriate for pt's clinical situation. Dermatologic: No rashes or ulcers noted.  No changes consistent with cellulitis.   CBC Lab Results  Component Value Date   WBC 10.8 (H) 03/30/2022   HGB 9.5 (L) 03/30/2022   HCT 30.3 (L) 03/30/2022   MCV 80.6 03/30/2022   PLT 389 03/30/2022    BMET    Component Value Date/Time   NA 136 03/29/2022 2105   NA 139 07/19/2021 0951   K 3.7 03/29/2022 2105   CL 102  03/29/2022 2105   CO2 25 03/29/2022 2105   GLUCOSE 107 (H) 03/29/2022 2105   BUN 9 03/29/2022 2105   BUN 10 07/19/2021 0951   CREATININE 0.76 03/30/2022 0533   CREATININE 0.81 09/20/2021 1358   CALCIUM 8.9 03/29/2022 2105   GFRNONAA >60 03/30/2022 0533   GFRNONAA 104 11/15/2020 0922   GFRAA 121 11/15/2020 0922   Estimated Creatinine Clearance: 107.3 mL/min (by C-G formula based on SCr of 0.76 mg/dL).  COAG Lab Results  Component Value Date   INR 1.3 (H) 03/30/2022    Radiology CT Angio Chest PE W and/or Wo Contrast  Result Date: 03/30/2022 CLINICAL DATA:  Concern for pulmonary embolism. EXAM: CT ANGIOGRAPHY CHEST WITH CONTRAST TECHNIQUE: Multidetector CT imaging of the chest was performed using the standard protocol during bolus administration of intravenous contrast. Multiplanar  CT image reconstructions and MIPs were obtained to evaluate the vascular anatomy. RADIATION DOSE REDUCTION: This exam was performed according to the departmental dose-optimization program which includes automated exposure control, adjustment of the mA and/or kV according to patient size and/or use of iterative reconstruction technique. CONTRAST:  70mL OMNIPAQUE IOHEXOL 350 MG/ML SOLN COMPARISON:  Chest radiograph dated 03/30/2022. FINDINGS: Cardiovascular: There is no cardiomegaly or pericardial effusion. The thoracic aorta is unremarkable. The origins of the great vessels of the aortic arch appear patent. No pulmonary artery embolus identified. Mediastinum/Nodes: No hilar or mediastinal adenopathy. The esophagus and the thyroid gland are grossly unremarkable. No mediastinal fluid collection. Lungs/Pleura: Bibasilar linear and streaky atelectasis. Patchy areas of consolidation along the lung bases, left greater right may represent atelectasis or infiltrate. There is a trace left pleural effusion. No pneumothorax. The central airways are patent. Upper Abdomen: Vascular coil with associated artifact in the left upper  abdomen. Musculoskeletal: No chest wall abnormality. No acute or significant osseous findings. Review of the MIP images confirms the above findings. IMPRESSION: 1. No CT evidence of pulmonary artery embolus. 2. Bibasilar atelectasis or infiltrate. Trace left pleural effusion. Electronically Signed   By: Elgie Collard M.D.   On: 03/30/2022 02:23   DG Chest 2 View  Result Date: 03/30/2022 CLINICAL DATA:  Fever EXAM: CHEST - 2 VIEW COMPARISON:  05/02/2021 FINDINGS: There is a new focus of retrocardiac consolidation at the left lung base. Lungs are otherwise clear. No pleural effusion or pneumothorax. IMPRESSION: Left basilar consolidation, which may indicate pneumonia. Electronically Signed   By: Deatra Robinson M.D.   On: 03/30/2022 01:20   CT ABDOMEN PELVIS W CONTRAST  Result Date: 03/29/2022 CLINICAL DATA:  Left upper quadrant pain status post splenic aneurysm coiling five days ago. EXAM: CT ABDOMEN AND PELVIS WITH CONTRAST TECHNIQUE: Multidetector CT imaging of the abdomen and pelvis was performed using the standard protocol following bolus administration of intravenous contrast. RADIATION DOSE REDUCTION: This exam was performed according to the departmental dose-optimization program which includes automated exposure control, adjustment of the mA and/or kV according to patient size and/or use of iterative reconstruction technique. CONTRAST:  OMNIPAQUE IOHEXOL 300 MG/ML  SOLN COMPARISON:  02/27/2022 FINDINGS: Lower chest: Bibasilar airspace opacities are noted, likely atelectasis. Trace left pleural effusion. Hepatobiliary: No focal liver abnormality is seen. Status post cholecystectomy. No biliary dilatation. Pancreas: No focal abnormality or ductal dilatation. Spleen: Large low-density wedge-shaped areas within the spleen compatible with splenic infarcts status post splenic artery aneurysm coiling. Adrenals/Urinary Tract: No adrenal abnormality. No focal renal abnormality. No stones or  hydronephrosis. Urinary bladder is unremarkable. Stomach/Bowel: Normal appendix. Stomach, large and small bowel grossly unremarkable. Vascular/Lymphatic: No evidence of aneurysm or adenopathy. Aneurysm coils noted in the left upper quadrant compatible with splenic artery aneurysm coiling. Reproductive: Uterus and adnexa unremarkable.  No mass. Other: No free fluid or free air. Musculoskeletal: No acute bony abnormality. IMPRESSION: Status post splenic artery aneurysm coiling. Large wedge-shaped low-density areas throughout the spleen compatible with splenic infarcts. This involves proximally half of the splenic volume. Bibasilar atelectasis.  Trace left pleural effusion. Electronically Signed   By: Charlett Nose M.D.   On: 03/29/2022 23:20   PERIPHERAL VASCULAR CATHETERIZATION  Result Date: 03/25/2022 See surgical note for result.  VAS [name] MESENTERIC  Result Date: 03/13/2022 ABDOMINAL VISCERAL Patient Name:  Sheri Malone  Date of Exam:   03/10/2022 Medical Rec #: [id number]        Accession #:    [id number]  Date of Birth: 1985/11/28        Patient Gender: F Patient Age:   54 years Exam Location:  [facility] Procedure:      VAS [name] MESENTERIC Referring Phys: [id number] Latina Craver Lucendia Leard -------------------------------------------------------------------------------- Indications: Splenic artery aneurysm Limitations: Air/bowel gas. Comparison Study: CT on 02/27/22 demonstrated 2.3 x 2.3cm splenic artery aneurysm Performing Technologist: Jamse Mead RT, RDMS, RVT  Examination Guidelines: A complete evaluation includes B-mode imaging, spectral Doppler, color Doppler, and power Doppler as needed of all accessible portions of each vessel. Bilateral testing is considered an integral part of a complete examination. Limited examinations for reoccurring indications may be performed as noted.  Duplex Findings: +--------------------+--------+--------+------+--------------+ Mesenteric          PSV cm/sEDV  cm/sPlaque   Comments    +--------------------+--------+--------+------+--------------+ Aorta Prox            125                                +--------------------+--------+--------+------+--------------+ Celiac Artery Origin  197                                +--------------------+--------+--------+------+--------------+ SMA Proximal          171      25                        +--------------------+--------+--------+------+--------------+ SMA Mid               180      26                        +--------------------+--------+--------+------+--------------+ CHA                   158      45                        +--------------------+--------+--------+------+--------------+ Splenic                94      31                        +--------------------+--------+--------+------+--------------+ IMA                                       not visualized +--------------------+--------+--------+------+--------------+  Summary: Mesenteric:  Patent celiac, SMA, hepatic and splenic arteries with no significant stenosis. Focally dilated 2.3 x 2.4cm vessel near the splenic hilum with turbulent arterial flow. This finding appear to correlate with a splenic artery aneurysm noted on previous CT.  *See table(s) above for measurements and observations.  Diagnosing physician: Levora Dredge MD  Electronically signed by Levora Dredge MD on 03/13/2022 at 7:15:29 PM.    Final      Assessment/Plan There are no diagnoses linked to this encounter.   Levora Dredge, MD  04/03/2022 1:04 PM

## 2022-04-04 LAB — CULTURE, BLOOD (ROUTINE X 2)
Culture: NO GROWTH
Culture: NO GROWTH
Special Requests: ADEQUATE

## 2022-04-06 ENCOUNTER — Encounter (INDEPENDENT_AMBULATORY_CARE_PROVIDER_SITE_OTHER): Payer: Self-pay | Admitting: Vascular Surgery

## 2022-04-08 ENCOUNTER — Encounter (INDEPENDENT_AMBULATORY_CARE_PROVIDER_SITE_OTHER): Payer: Self-pay | Admitting: Vascular Surgery

## 2022-04-08 ENCOUNTER — Other Ambulatory Visit: Payer: Self-pay | Admitting: Family Medicine

## 2022-04-08 DIAGNOSIS — J4521 Mild intermittent asthma with (acute) exacerbation: Secondary | ICD-10-CM

## 2022-04-09 ENCOUNTER — Encounter (INDEPENDENT_AMBULATORY_CARE_PROVIDER_SITE_OTHER): Payer: Self-pay | Admitting: Nurse Practitioner

## 2022-04-11 ENCOUNTER — Encounter (INDEPENDENT_AMBULATORY_CARE_PROVIDER_SITE_OTHER): Payer: Self-pay | Admitting: Vascular Surgery

## 2022-04-14 ENCOUNTER — Ambulatory Visit (INDEPENDENT_AMBULATORY_CARE_PROVIDER_SITE_OTHER): Payer: 59 | Admitting: Vascular Surgery

## 2022-04-16 ENCOUNTER — Ambulatory Visit
Admission: RE | Admit: 2022-04-16 | Discharge: 2022-04-16 | Disposition: A | Discharge status: Home / Self Care | Payer: 59 | Source: Ambulatory Visit | Attending: Family Medicine | Admitting: Family Medicine

## 2022-04-16 ENCOUNTER — Ambulatory Visit
Admission: RE | Admit: 2022-04-16 | Discharge: 2022-04-16 | Disposition: A | Discharge status: Home / Self Care | Payer: 59 | Attending: Family Medicine | Admitting: Family Medicine

## 2022-04-16 DIAGNOSIS — Z09 Encounter for follow-up examination after completed treatment for conditions other than malignant neoplasm: Secondary | ICD-10-CM | POA: Insufficient documentation

## 2022-04-16 DIAGNOSIS — J4521 Mild intermittent asthma with (acute) exacerbation: Secondary | ICD-10-CM

## 2022-04-16 DIAGNOSIS — R071 Chest pain on breathing: Secondary | ICD-10-CM | POA: Diagnosis present

## 2022-04-16 DIAGNOSIS — J189 Pneumonia, unspecified organism: Secondary | ICD-10-CM

## 2022-05-08 ENCOUNTER — Encounter (INDEPENDENT_AMBULATORY_CARE_PROVIDER_SITE_OTHER): Payer: Self-pay | Admitting: Nurse Practitioner

## 2022-05-08 ENCOUNTER — Ambulatory Visit (INDEPENDENT_AMBULATORY_CARE_PROVIDER_SITE_OTHER): Payer: 59

## 2022-05-08 ENCOUNTER — Ambulatory Visit (INDEPENDENT_AMBULATORY_CARE_PROVIDER_SITE_OTHER): Payer: 59 | Admitting: Nurse Practitioner

## 2022-05-08 VITALS — BP 130/80 | HR 90 | Resp 16 | Wt 223.8 lb

## 2022-05-08 DIAGNOSIS — I728 Aneurysm of other specified arteries: Secondary | ICD-10-CM

## 2022-05-08 DIAGNOSIS — J189 Pneumonia, unspecified organism: Secondary | ICD-10-CM

## 2022-05-08 NOTE — Progress Notes (Signed)
Subjective:    Patient ID: Sheri Malone, female    DOB: Dec 25, 1985, 36 y.o.   MRN: [id number] Chief Complaint  Patient presents with   Follow-up    Ultrasound follow up     Sheri Malone is a 36 y.o. (03/30/1986) female who presents with chief complaint of check circulation.   History of Present Illness:    The patient returns to the office for followup and review status post angiogram with intervention on 03/25/2022.    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  Since that time her symptoms have resolved her breathing is doing much better.   There are no recent neurological changes noted. No documented history of DVT, PE or superficial thrombophlebitis. The patient denies recent episodes of angina.    Noninvasive study showed no evidence of splenic artery aneurysm.  Vessels are normal caliber.  There is arterial flow seen in the hilum of the spleen.    Review of Systems  All other systems reviewed and are negative.      Objective:   Physical Exam Vitals reviewed.  HENT:     Head: Normocephalic.  Cardiovascular:     Rate and Rhythm: Normal rate and regular rhythm.  Pulmonary:     Effort: Pulmonary effort is normal.  Skin:    General: Skin is warm and dry.  Neurological:     Mental Status: She is alert and oriented to person, place, and time.  Psychiatric:        Mood and Affect: Mood normal.        Behavior: Behavior normal.        Thought Content: Thought content normal.        Judgment: Judgment normal.     BP 130/80 (BP Location: Right Arm)   Pulse 90   Resp 16   Wt 223 lb 12.8 oz (101.5 kg)   BMI 39.64 kg/m   Past Medical History:  Diagnosis Date   Abnormal Pap smear    Asthma    GERD (gastroesophageal reflux disease)    Gestational HTN    Headache(784.0)    History of ectopic pregnancy    History of gestational diabetes    Low hemoglobin     on ironspan   Rhinitis    Vaginal Pap smear, abnormal     Social History   Socioeconomic History   Marital status: Married    Spouse name: Hayward   Number of children: 2   Years of education: Not on file   Highest education level: Master's degree (e.g., MA, MS, MEng, MEd, MSW, MBA)  Occupational History   Not on file  Tobacco Use   Smoking status: Never   Smokeless tobacco: Never  Vaping Use   Vaping Use: Never used  Substance and Sexual Activity   Alcohol use: Yes    Comment: occasionally/once a month   Drug use: No   Sexual activity: Yes    Partners: Male    Birth control/protection: Pill  Other Topics Concern   Not on file  Social History Narrative   Lives at home with husband and daughter and son.   Right handed   Social Determinants of Health   Financial Resource Strain: Low Risk  (02/04/2022)   Overall Financial Resource Strain (CARDIA)    Difficulty of Paying Living Expenses: Not hard at all  Food Insecurity: No Food Insecurity (02/04/2022)  Hunger Vital Sign    Worried About Running Out of Food in the Last Year: Never true    Ran Out of Food in the Last Year: Never true  Transportation Needs: Unmet Transportation Needs (02/04/2022)   PRAPARE - Transportation    Lack of Transportation (Medical): Yes    Lack of Transportation (Non-Medical): Yes  Physical Activity: Insufficiently Active (02/04/2022)   Exercise Vital Sign    Days of Exercise per Week: 3 days    Minutes of Exercise per Session: 30 min  Stress: No Stress Concern Present (02/04/2022)   [employer] of Occupational Health - Occupational Stress Questionnaire    Feeling of Stress : Only a little  Social Connections: Moderately Integrated (02/04/2022)   Social Connection and Isolation Panel [NHANES]    Frequency of Communication with Friends and Family: Three times a week    Frequency of Social Gatherings with Friends and Family: Three times a week    Attends Religious Services: 1 to 4 times per  year    Active Member of Clubs or Organizations: Yes    Attends Banker Meetings: 1 to 4 times per year    Marital Status: Never married  Intimate Partner Violence: Not At Risk (02/04/2022)   Humiliation, Afraid, Rape, and Kick questionnaire    Fear of Current or Ex-Partner: No    Emotionally Abused: No    Physically Abused: No    Sexually Abused: No    Past Surgical History:  Procedure Laterality Date   CESAREAN SECTION N/A 05/20/2013   Procedure: CESAREAN SECTION;  Surgeon: Genia Del, MD;  Location: [facility];  Service: Obstetrics;  Laterality: N/A;  primary   CESAREAN SECTION N/A 02/04/2018   Procedure: Repeat CESAREAN SECTION;  Surgeon: Noland Fordyce, MD;  Location: [facility] BIRTHING SUITES;  Service: Obstetrics;  Laterality: N/A;  EDD: 02/16/18   CHOLECYSTECTOMY N/A 09/28/2018   Procedure: LAPAROSCOPIC CHOLECYSTECTOMY;  Surgeon: Abigail Miyamoto, MD;  Location: [facility] OR;  Service: General;  Laterality: N/A;   DILATION AND CURETTAGE OF UTERUS  2009   SALPINGOOPHORECTOMY  2009   left   UNILATERAL SALPINGECTOMY Right 02/04/2018   Procedure: UNILATERAL SALPINGECTOMY;  Surgeon: Noland Fordyce, MD;  Location: [facility] BIRTHING SUITES;  Service: Obstetrics;  Laterality: Right;   VISCERAL ARTERY INTERVENTION N/A 03/25/2022   Procedure: VISCERAL ARTERY INTERVENTION;  Surgeon: Renford Dills, MD;  Location: [facility] INVASIVE CV LAB;  Service: Cardiovascular;  Laterality: N/A;   WISDOM TOOTH EXTRACTION      Family History  Problem Relation Age of Onset   Migraines Mother    Hypertension Mother    Breast cancer Mother    Diabetes Father    Hypertension Father    Multiple sclerosis Sister    Cancer Maternal Grandfather        prostate   Heart disease Paternal Grandfather    Stomach cancer Neg Hx    Rectal cancer Neg Hx    Esophageal cancer Neg Hx    Colon cancer Neg Hx     Allergies  Allergen Reactions   Dust Mite Mixed Allergen Ext [Mite (D. Farinae)] Anaphylaxis   Other  Anaphylaxis and Other (See Comments)    DUST ROACH       Latest Ref Rng & Units 05/08/2022    8:06 AM 03/30/2022    5:33 AM 03/29/2022    9:05 PM  CBC  WBC 3.8 - 10.8 Thousand/uL 10.1  10.8  14.6   Hemoglobin 11.7 - 15.5 g/dL 51.0  9.5  10.9   Hematocrit 35.0 - 45.0 % 34.4  30.3  35.0   Platelets 140 - 400 Thousand/uL 410  389  437       CMP     Component Value Date/Time   NA 136 03/29/2022 2105   NA 139 07/19/2021 0951   K 3.7 03/29/2022 2105   CL 102 03/29/2022 2105   CO2 25 03/29/2022 2105   GLUCOSE 107 (H) 03/29/2022 2105   BUN 9 03/29/2022 2105   BUN 10 07/19/2021 0951   CREATININE 0.76 03/30/2022 0533   CREATININE 0.81 09/20/2021 1358   CALCIUM 8.9 03/29/2022 2105   PROT 7.1 03/29/2022 2105   ALBUMIN 3.5 03/29/2022 2105   AST 13 (L) 03/29/2022 2105   ALT 12 03/29/2022 2105   ALKPHOS 34 (L) 03/29/2022 2105   BILITOT 0.6 03/29/2022 2105   GFRNONAA >60 03/30/2022 0533   GFRNONAA 104 11/15/2020 0922   GFRAA 121 11/15/2020 0922     No results found.     Assessment & Plan:   1. Splenic artery aneurysm [facility]) She is s/p successful coil embolization of a large splenic artery aneurysm.  Her post procedure course has been complication by some pulmonary issues but she does that she no longer continues to have any issues with that.  This is not an unusual sequela of embolization.  Review of the CT scan from the ER visit shows roughly half the spleen is still well perfused which is excellent.   The patient return in 1 year for reevaluation.  2. Pneumonia of left lower lobe due to infectious organism Patient notes that her breathing is much improved.   Current Outpatient Medications on File Prior to Visit  Medication Sig Dispense Refill   albuterol (PROVENTIL HFA;VENTOLIN HFA) 108 (90 Base) MCG/ACT inhaler Inhale 1 puff into the lungs every 6 (six) hours as needed for wheezing or shortness of breath.     azelastine (ASTELIN) 0.1 % nasal spray INHALE 1 2 PUFFS IN EACH  NOSTRIL TWICE A DAY     azelastine (OPTIVAR) 0.05 % ophthalmic solution PUT 1 DROP INTO AFFECTED EYE 2 TIMES A DAY     EPINEPHrine 0.3 mg/0.3 mL IJ SOAJ injection epinephrine 0.3 mg/0.3 mL injection, auto-injector  INJECT 0.3 MLS (0.3 MG TOTAL) INTO THE MUSCLE ONCE FOR 1 DOSE.     fluticasone (FLONASE) 50 MCG/ACT nasal spray Place into the nose.     ipratropium-albuterol (DUONEB) 0.5-2.5 (3) MG/3ML SOLN Take 3 mLs by nebulization every 6 (six) hours as needed (for wheeze, coughing spells, chest tightness, SOB). 180 mL 1   levocetirizine (XYZAL) 5 MG tablet Take by mouth.     montelukast (SINGULAIR) 10 MG tablet Take by mouth.     ondansetron (ZOFRAN) 4 MG tablet Take 1 tablet (4 mg total) by mouth every 8 (eight) hours as needed for nausea or vomiting. 20 tablet 0   oxyCODONE-acetaminophen (PERCOCET) 5-325 MG tablet Take 1 tablet by mouth every 6 (six) hours as needed for moderate pain. 20 tablet 0   pantoprazole (PROTONIX) 40 MG tablet TAKE 1 TABLET (40 MG TOTAL) BY MOUTH IN THE MORNING. 30 tablet 5   SUMAtriptan (IMITREX) 100 MG tablet Take 1 tablet (100 mg total) by mouth once as needed for up to 2 doses. May repeat in 2 hours if headache persists or recurs. 10 tablet 12   SYMBICORT 160-4.5 MCG/ACT inhaler INHALE 2 PUFFS INTO THE LUNGS IN THE MORNING AND AT BEDTIME. 10.2 each 1   budesonide (PULMICORT)  0.5 MG/2ML nebulizer solution Take 2 mLs (0.5 mg total) by nebulization in the morning and at bedtime for 14 days. 56 mL 0   No current facility-administered medications on file prior to visit.    There are no Patient Instructions on file for this visit. No follow-ups on file.   Georgiana Spinner, NP

## 2022-05-09 ENCOUNTER — Encounter (INDEPENDENT_AMBULATORY_CARE_PROVIDER_SITE_OTHER): Payer: Self-pay | Admitting: Nurse Practitioner

## 2022-05-09 LAB — CBC WITH DIFFERENTIAL/PLATELET
Absolute Monocytes: 636 cells/uL (ref 200–950)
Basophils Absolute: 61 cells/uL (ref 0–200)
Basophils Relative: 0.6 %
Eosinophils Absolute: 101 cells/uL (ref 15–500)
Eosinophils Relative: 1 %
HCT: 34.4 % — ABNORMAL LOW (ref 35.0–45.0)
Hemoglobin: 11.2 g/dL — ABNORMAL LOW (ref 11.7–15.5)
Lymphs Abs: 2606 cells/uL (ref 850–3900)
MCH: 25.6 pg — ABNORMAL LOW (ref 27.0–33.0)
MCHC: 32.6 g/dL (ref 32.0–36.0)
MCV: 78.5 fL — ABNORMAL LOW (ref 80.0–100.0)
MPV: 9.2 fL (ref 7.5–12.5)
Monocytes Relative: 6.3 %
Neutro Abs: 6696 cells/uL (ref 1500–7800)
Neutrophils Relative %: 66.3 %
Platelets: 410 10*3/uL — ABNORMAL HIGH (ref 140–400)
RBC: 4.38 10*6/uL (ref 3.80–5.10)
RDW: 13.1 % (ref 11.0–15.0)
Total Lymphocyte: 25.8 %
WBC: 10.1 10*3/uL (ref 3.8–10.8)

## 2022-05-09 LAB — COMPLETE METABOLIC PANEL WITH GFR
AG Ratio: 1.5 (calc) (ref 1.0–2.5)
ALT: 8 U/L (ref 6–29)
AST: 9 U/L — ABNORMAL LOW (ref 10–30)
Albumin: 3.9 g/dL (ref 3.6–5.1)
Alkaline phosphatase (APISO): 24 U/L — ABNORMAL LOW (ref 31–125)
BUN: 8 mg/dL (ref 7–25)
CO2: 25 mmol/L (ref 20–32)
Calcium: 9.2 mg/dL (ref 8.6–10.2)
Chloride: 107 mmol/L (ref 98–110)
Creat: 0.84 mg/dL (ref 0.50–0.97)
Globulin: 2.6 g/dL (calc) (ref 1.9–3.7)
Glucose, Bld: 95 mg/dL (ref 65–99)
Potassium: 4.2 mmol/L (ref 3.5–5.3)
Sodium: 141 mmol/L (ref 135–146)
Total Bilirubin: 0.3 mg/dL (ref 0.2–1.2)
Total Protein: 6.5 g/dL (ref 6.1–8.1)
eGFR: 91 mL/min/{1.73_m2} (ref >=60)

## 2022-05-09 LAB — LIPID PANEL
Cholesterol: 135 mg/dL (ref <200)
HDL: 48 mg/dL — ABNORMAL LOW (ref >=50)
LDL Cholesterol (Calc): 66 mg/dL (calc)
Non-HDL Cholesterol (Calc): 87 mg/dL (calc) (ref <130)
Total CHOL/HDL Ratio: 2.8 (calc) (ref <5.0)
Triglycerides: 120 mg/dL (ref <150)

## 2022-05-09 LAB — HEMOGLOBIN A1C
Hgb A1c MFr Bld: 5.6 % of total Hgb (ref <5.7)
Mean Plasma Glucose: 114 mg/dL
eAG (mmol/L): 6.3 mmol/L

## 2022-05-09 LAB — TSH: TSH: 1.14 mIU/L

## 2022-06-04 ENCOUNTER — Telehealth: Payer: Self-pay | Admitting: Skilled Nursing Facility1

## 2022-06-04 NOTE — Telephone Encounter (Signed)
Dietitian called pt to assess their understanding of the pre-op nutrition recommendations through the teach back method to ensure the pts knowledge readiness in preparation for surgery.    LVM 

## 2022-06-06 ENCOUNTER — Other Ambulatory Visit: Payer: Self-pay | Admitting: Nurse Practitioner

## 2022-06-06 DIAGNOSIS — K219 Gastro-esophageal reflux disease without esophagitis: Secondary | ICD-10-CM

## 2022-06-06 NOTE — Telephone Encounter (Signed)
Requested Prescriptions  Pending Prescriptions Disp Refills  . pantoprazole (PROTONIX) 40 MG tablet [Pharmacy Med Name: PANTOPRAZOLE SOD DR 40 MG TAB] 30 tablet 5    Sig: TAKE 1 TABLET (40 MG TOTAL) BY MOUTH IN THE MORNING     Gastroenterology: Proton Pump Inhibitors Passed - 06/06/2022 12:23 PM      Passed - Valid encounter within last 12 months    Recent Outpatient Visits          2 months ago Encounter for examination following treatment at hospital   [facility] Danelle Berry, PA-C   4 months ago Annual physical exam   [facility] Danelle Berry, PA-C   7 months ago Class 2 severe obesity with serious comorbidity and body mass index (BMI) of 39.0 to 39.9 in adult, unspecified obesity type [facility])   [facility] Berniece Salines, FNP   7 months ago Dizziness   [facility] Berniece Salines, FNP   8 months ago Dizziness   [facility] Berniece Salines, FNP      Future Appointments            In 8 months Danelle Berry, PA-C [facility]

## 2022-06-09 ENCOUNTER — Telehealth: Payer: Self-pay | Admitting: Skilled Nursing Facility1

## 2022-06-09 NOTE — Telephone Encounter (Signed)
Dietitian called pt to assess their understanding of the pre-op nutrition recommendations through the teach back method to ensure the pts knowledge readiness in preparation for surgery.     Pt states she feels prepared.

## 2022-06-17 ENCOUNTER — Ambulatory Visit: Payer: Self-pay | Admitting: *Deleted

## 2022-06-17 ENCOUNTER — Telehealth: Payer: 59

## 2022-06-17 ENCOUNTER — Ambulatory Visit (HOSPITAL_COMMUNITY)
Admission: EM | Admit: 2022-06-17 | Discharge: 2022-06-17 | Disposition: A | Discharge status: Home / Self Care | Payer: 59

## 2022-06-17 ENCOUNTER — Encounter (HOSPITAL_COMMUNITY): Payer: Self-pay | Admitting: *Deleted

## 2022-06-17 DIAGNOSIS — T7840XA Allergy, unspecified, initial encounter: Secondary | ICD-10-CM | POA: Diagnosis not present

## 2022-06-17 DIAGNOSIS — L509 Urticaria, unspecified: Secondary | ICD-10-CM

## 2022-06-17 MED ORDER — METHYLPREDNISOLONE SODIUM SUCC 125 MG IJ SOLR
125.0000 mg | Freq: Once | INTRAMUSCULAR | Status: AC
  Administered 2022-06-17: 125 mg via INTRAMUSCULAR

## 2022-06-17 MED ORDER — METHYLPREDNISOLONE SODIUM SUCC 125 MG IJ SOLR
INTRAMUSCULAR | Status: AC
  Filled 2022-06-17: qty 2

## 2022-06-17 MED ORDER — PREDNISONE 10 MG (21) PO TBPK: ORAL_TABLET | Freq: Every day | ORAL | 0 refills | Status: DC

## 2022-06-17 NOTE — Discharge Instructions (Signed)
Today you are being treated for your hives, the cause of them are unknown, please monitor food intake and things that you come in close contact with over the next few days  You have been given an injection of steroids today in the office today to help reduce the inflammatory process that occurs with this rash which will help minimize your itching as well as begin to clear  Starting tomorrow take prednisone every morning with food as directed, to continue the above process  You may continue use of topical calamine or Benadryl cream to help manage itching, you may also continue oral Benadryl  Please avoid long exposures to heat such as a hot steamy shower or being outside as this may cause further irritation to your rash  At any point if you begin to have difficulty breathing please administer your EpiPen and then go to the nearest emergency department for further evaluation and treatment

## 2022-06-17 NOTE — ED Provider Notes (Signed)
[facility]    CSN: [id number] Arrival date & time: 06/17/22  1255      History   Chief Complaint Chief Complaint  Patient presents with   Allergic Reaction    HPI Sheri Malone is a 36 y.o. female.   Patient presents with generalized hives and a sensation of a scratchy and tight throat beginning 1 day ago.  Endorses that symptoms began after she ate dinner, consumed a steak and a salad that she prepared within her home.  No other members of household has similar symptoms.  Took a dose of Benadryl last night and went to bed, symptoms continue to persist this morning.  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  Denies shortness of breath, wheezing, coughing, chest pain or chest tightness.  Past Medical History:  Diagnosis Date   Abnormal Pap smear    Asthma    GERD (gastroesophageal reflux disease)    Gestational HTN    Headache(784.0)    History of ectopic pregnancy    History of gestational diabetes    Low hemoglobin    on ironspan   Rhinitis    Vaginal Pap smear, abnormal     Patient Active Problem List   Diagnosis Date Noted   Left lower lobe pneumonia 03/30/2022   SIRS, possible sepsis (systemic inflammatory response syndrome) (HCC) 03/30/2022   S/P coil embolization of splenic artery aneurysm 03/25/22 03/30/2022   Vertigo 11/24/2021   BPPV (benign paroxysmal positional vertigo), left 11/15/2021   Splenic artery aneurysm (HCC) 08/06/2021   Effusion of right ankle 07/31/2021   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   Iron deficiency anemia 11/15/2020   Slow transit constipation 11/15/2020   Microcytosis 11/15/2020   Arthralgia of right ankle 08/15/2020   Stiffness of right ankle joint 08/15/2020   Closed trimalleolar fracture of right ankle 05/21/2020   Gastro-esophageal reflux disease without esophagitis 04/13/2020   Obesity, Class III, BMI 40-49.9 (morbid obesity) (HCC) 01/20/2020   Vitamin D deficiency 01/20/2020   Prediabetes 01/20/2020   Insulin  resistance 01/20/2020   Metabolic syndrome 01/20/2020   Allergic rhinitis 11/23/2016   Allergic conjunctivitis 01/17/2016   Mild intermittent asthma 01/17/2016   Dermatitis, eczematoid 06/21/2015   H/O cesarean section 06/21/2015   History of diabetes mellitus arising in pregnancy 06/21/2015   Headache, migraine 06/21/2015    Past Surgical History:  Procedure Laterality Date   CESAREAN SECTION N/A 05/20/2013   Procedure: CESAREAN SECTION;  Surgeon: Genia Del, MD;  Location: [facility];  Service: Obstetrics;  Laterality: N/A;  primary   CESAREAN SECTION N/A 02/04/2018   Procedure: Repeat CESAREAN SECTION;  Surgeon: Noland Fordyce, MD;  Location: [facility] BIRTHING SUITES;  Service: Obstetrics;  Laterality: N/A;  EDD: 02/16/18   CHOLECYSTECTOMY N/A 09/28/2018   Procedure: LAPAROSCOPIC CHOLECYSTECTOMY;  Surgeon: Abigail Miyamoto, MD;  Location: [facility] OR;  Service: General;  Laterality: N/A;   DILATION AND CURETTAGE OF UTERUS  2009   SALPINGOOPHORECTOMY  2009   left   UNILATERAL SALPINGECTOMY Right 02/04/2018   Procedure: UNILATERAL SALPINGECTOMY;  Surgeon: Noland Fordyce, MD;  Location: [facility] BIRTHING SUITES;  Service: Obstetrics;  Laterality: Right;   VISCERAL ARTERY INTERVENTION N/A 03/25/2022   Procedure: VISCERAL ARTERY INTERVENTION;  Surgeon: Renford Dills, MD;  Location: [facility] INVASIVE CV LAB;  Service: Cardiovascular;  Laterality: N/A;   WISDOM TOOTH EXTRACTION      OB History     Gravida  3   Para  2   Term  2   Preterm  0  AB  1   Living  2      SAB  0   IAB  0   Ectopic  1   Multiple      Live Births  2            Home Medications    Prior to Admission medications   Medication Sig Start Date End Date Taking? Authorizing Provider  albuterol (PROVENTIL HFA;VENTOLIN HFA) 108 (90 Base) MCG/ACT inhaler Inhale 1 puff into the lungs every 6 (six) hours as needed for wheezing or shortness of breath.   Yes [provider]  azelastine (ASTELIN) 0.1 %  nasal spray INHALE 1 2 PUFFS IN EACH NOSTRIL TWICE A DAY 02/25/19  Yes [provider]  azelastine (OPTIVAR) 0.05 % ophthalmic solution PUT 1 DROP INTO AFFECTED EYE 2 TIMES A DAY 02/25/19  Yes [provider]  budesonide (PULMICORT) 0.5 MG/2ML nebulizer solution Take 2 mLs (0.5 mg total) by nebulization in the morning and at bedtime for 14 days. 04/01/22 06/17/22 Yes Danelle Berry, PA-C  EPINEPHrine 0.3 mg/0.3 mL IJ SOAJ injection epinephrine 0.3 mg/0.3 mL injection, auto-injector  INJECT 0.3 MLS (0.3 MG TOTAL) INTO THE MUSCLE ONCE FOR 1 DOSE.   Yes [provider]  ipratropium-albuterol (DUONEB) 0.5-2.5 (3) MG/3ML SOLN Take 3 mLs by nebulization every 6 (six) hours as needed (for wheeze, coughing spells, chest tightness, SOB). 04/01/22  Yes Danelle Berry, PA-C  levocetirizine (XYZAL) 5 MG tablet Take by mouth.   Yes [provider]  montelukast (SINGULAIR) 10 MG tablet Take by mouth.   Yes [provider]  ondansetron (ZOFRAN) 4 MG tablet Take 1 tablet (4 mg total) by mouth every 8 (eight) hours as needed for nausea or vomiting. 03/25/22  Yes Schnier, Latina Craver, MD  pantoprazole (PROTONIX) 40 MG tablet TAKE 1 TABLET (40 MG TOTAL) BY MOUTH IN THE MORNING 06/06/22  Yes Danelle Berry, PA-C  predniSONE (STERAPRED UNI-PAK 21 TAB) 10 MG (21) TBPK tablet Take by mouth daily. Take 6 tabs by mouth daily  for 2 days, then 5 tabs for 2 days, then 4 tabs for 2 days, then 3 tabs for 2 days, 2 tabs for 2 days, then 1 tab by mouth daily for 2 days 06/17/22  Yes Liz Pinho R, NP  SUMAtriptan (IMITREX) 100 MG tablet Take 1 tablet (100 mg total) by mouth once as needed for up to 2 doses. May repeat in 2 hours if headache persists or recurs. 11/22/21  Yes Anson Fret, MD  SYMBICORT 160-4.5 MCG/ACT inhaler INHALE 2 PUFFS INTO THE LUNGS IN THE MORNING AND AT BEDTIME. 07/10/21  Yes Danelle Berry, PA-C  fluticasone (FLONASE) 50 MCG/ACT nasal spray Place into the nose. 10/21/21   [provider]  Norethindrone Acetate-Ethinyl Estrad-FE (JUNEL FE 24) 1-20 MG-MCG(24) tablet     [provider]  oxyCODONE-acetaminophen (PERCOCET) 5-325 MG tablet Take 1 tablet by mouth every 6 (six) hours as needed for moderate pain. 03/29/22   Minna Antis, MD    Family History Family History  Problem Relation Age of Onset   Migraines Mother    Hypertension Mother    Breast cancer Mother    Diabetes Father    Hypertension Father    Multiple sclerosis Sister    Cancer Maternal Grandfather        prostate   Heart disease Paternal Grandfather    Stomach cancer Neg Hx    Rectal cancer Neg Hx    Esophageal cancer Neg  Hx    Colon cancer Neg Hx     Social History Social History   Tobacco Use   Smoking status: Never   Smokeless tobacco: Never  Vaping Use   Vaping Use: Never used  Substance Use Topics   Alcohol use: Yes    Comment: occasionally/once a month   Drug use: No     Allergies   Dust mite mixed allergen ext [mite (d. farinae)], Other, and Shellfish allergy   Review of Systems Review of Systems  Constitutional: Negative.   Respiratory: Negative.    Cardiovascular: Negative.   Skin:  Positive for rash. Negative for color change, pallor and wound.     Physical Exam Triage Vital Signs ED Triage Vitals  Enc Vitals Group     BP 06/17/22 1320 (!) 161/95     Pulse Rate 06/17/22 1320 97     Resp 06/17/22 1320 18     Temp 06/17/22 1320 98.4 F (36.9 C)     Temp Source 06/17/22 1320 Oral     SpO2 06/17/22 1320 98 %     Weight --      Height --      Head Circumference --      Peak Flow --      Pain Score 06/17/22 1317 0     Pain Loc --      Pain Edu? --      Excl. in GC? --    No data found.  Updated Vital Signs BP (!) 161/95 (BP Location: Left Arm)   Pulse 97   Temp 98.4 F (36.9 C) (Oral)   Resp 18   LMP 06/13/2022 (Approximate)   SpO2 98%   Visual Acuity Right Eye Distance:   Left Eye Distance:   Bilateral Distance:     Right Eye Near:   Left Eye Near:    Bilateral Near:     Physical Exam Constitutional:      Appearance: Normal appearance.  HENT:     Mouth/Throat:     Mouth: Mucous membranes are moist.     Pharynx: Oropharynx is clear.  Cardiovascular:     Rate and Rhythm: Normal rate and regular rhythm.     Pulses: Normal pulses.     Heart sounds: Normal heart sounds.  Pulmonary:     Effort: Pulmonary effort is normal.     Breath sounds: Normal breath sounds.  Skin:    Comments: Hives present generalized to the body.  Neurological:     Mental Status: She is alert.      UC Treatments / Results  Labs (all labs ordered are listed, but only abnormal results are displayed) Labs Reviewed - No data to display  EKG   Radiology No results found.  Procedures Procedures (including critical care time)  Medications Ordered in UC Medications  methylPREDNISolone sodium succinate (SOLU-MEDROL) 125 mg/2 mL injection 125 mg (125 mg Intramuscular Given 06/17/22 1325)    Initial Impression / Assessment and Plan / UC Course  I have reviewed the triage vital signs and the nursing notes.  Pertinent labs & imaging results that were available during my care of the patient were reviewed by me and considered in my medical decision making (see chart for details).  Hives, allergic reaction, initial encounter  Vital signs are stable, O2 saturation 98% on room air, lungs are clear to auscultation and the pharynx is clear without obstruction, methylprednisolone injection given in office and on reevaluation after 15 minutes patient endorses that some of  the hives have begun to clear, prednisone 60 mg taper prescribed for outpatient use and may continue use of oral or topical antihistamines and calamine lotion for management of pruritus, given strict precautions at any point if she begins to have difficulty breathing she is to administer her EpiPen and go to the nearest emergency department for further  evaluation and management , as offending agent is unknown patient is to monitor closely food intake as well as contact within household Final Clinical Impressions(s) / UC Diagnoses   Final diagnoses:  Hives  Allergic reaction, initial encounter     Discharge Instructions      Today you are being treated for your hives, the cause of them are unknown, please monitor food intake and things that you come in close contact with over the next few days  You have been given an injection of steroids today in the office today to help reduce the inflammatory process that occurs with this rash which will help minimize your itching as well as begin to clear  Starting tomorrow take prednisone every morning with food as directed, to continue the above process  You may continue use of topical calamine or Benadryl cream to help manage itching, you may also continue oral Benadryl  Please avoid long exposures to heat such as a hot steamy shower or being outside as this may cause further irritation to your rash  At any point if you begin to have difficulty breathing please administer your EpiPen and then go to the nearest emergency department for further evaluation and treatment    ED Prescriptions     Medication Sig Dispense Auth. Provider   predniSONE (STERAPRED UNI-PAK 21 TAB) 10 MG (21) TBPK tablet Take by mouth daily. Take 6 tabs by mouth daily  for 2 days, then 5 tabs for 2 days, then 4 tabs for 2 days, then 3 tabs for 2 days, 2 tabs for 2 days, then 1 tab by mouth daily for 2 days 42 tablet Danylah Holden, Elita Boone, NP      PDMP not reviewed this encounter.   Valinda Hoar, NP 06/17/22 1349

## 2022-06-17 NOTE — ED Triage Notes (Signed)
Pt states that she ate dinner last night and started breaking out in hives and she states that her throat feels a little tight but not like its closing. She is allergic to shell fish but she doesn't think her food contained shellfish. Hives all over her body, she did take benadryl last night.   She did state that the hives have spread today.

## 2022-06-17 NOTE — Telephone Encounter (Signed)
  Chief Complaint: Hives Symptoms: Few small hives on arms. States last night 7pm broke out widespread, took benadryl, resolved until this AM, on arms. States sees allergist, allergic to dust, mites. Allergist not in, advised to alert PCP. Frequency: Last evening Pertinent Negatives: Patient denies SOB, wheezing, fever. Disposition: [] ED /[] Urgent Care (no appt availability in office) / [] Appointment(In office/virtual)/ [x]  [facility] [facility]/ [] Home Care/ [] Refused Recommended Disposition /[] [facility]/ []  Follow-up with PCP Additional Notes: States cannot take benadryl as at work presently. No availability at practice today. [facility] secured for pt. Care advise provided, verbalizes understanding.  Reason for Disposition  [1] MODERATE-SEVERE hives persist (i.e., hives interfere with normal activities or work) AND [2] taking antihistamine (e.g., Benadryl, Claritin) > 24 hours  Answer Assessment - Initial Assessment Questions 1. APPEARANCE: "What does the rash look like?"      Hives 2. LOCATION: "Where is the rash located?"      Arms  3. NUMBER: "How many hives are there?"      Multiple 4. SIZE: "How big are the hives?" (inches, cm, compare to coins) "Do they all look the same or is there lots of variation in shape and size?"      Pencil eraser size or smaller 5. ONSET: "When did the hives begin?" (Hours or days ago)      Last night 7pm, back neck legs and stomach last night 6. ITCHING: "Does it itch?" If Yes, ask: "How bad is the itch?"    - MILD: doesn't interfere with normal activities   - MODERATE-SEVERE: interferes with work, school, sleep, or other activities      severe 7. RECURRENT PROBLEM: "Have you had hives before?" If Yes, ask: "When was the last time?" and "What happened that time?"      Yes. Sees allergist, not in today 8. TRIGGERS: "Were you exposed to any new food, plant, cosmetic product or animal just before the hives began?"     Allergies, dust  mites 9. OTHER SYMPTOMS: "Do you have any other symptoms?" (e.g., fever, tongue swelling, difficulty breathing, abdomen pain)     No  Protocols used: Hives-A-AH

## 2022-06-18 NOTE — Progress Notes (Signed)
Sent message, via epic in basket, requesting order in epic from surgeon     06/18/22 1422  Preop Orders  Has preop orders? No  Name of staff/physician contacted for orders(Indicate phone or IB message) E. Andrey Campanile, MD.

## 2022-06-19 NOTE — Progress Notes (Signed)
Sent message, via epic in basket, requesting order in epic from surgeon     06/19/22 1435  Preop Orders  Has preop orders? No  Name of staff/physician contacted for orders(Indicate phone or IB message) E. Andrey Campanile, MD.

## 2022-06-25 ENCOUNTER — Ambulatory Visit: Payer: Self-pay | Admitting: General Surgery

## 2022-06-25 DIAGNOSIS — D735 Infarction of spleen: Secondary | ICD-10-CM

## 2022-06-25 NOTE — Progress Notes (Signed)
Please place orders for PAT appointment scheduled 06/26/22.

## 2022-06-25 NOTE — Patient Instructions (Addendum)
SURGICAL WAITING ROOM VISITATION Patients having surgery or a procedure may have no more than 2 support people in the waiting area - these visitors may rotate.   Children under the age of 38 must have an adult with them who is not the patient. If the patient needs to stay at the hospital during part of their recovery, the visitor guidelines for inpatient rooms apply. Pre-op nurse will coordinate an appropriate time for 1 support person to accompany patient in pre-op.  This support person may not rotate.    Please refer to the [facility] website for the visitor guidelines for Inpatients (after your surgery is over and you are in a regular room).    Your procedure is scheduled on: 07/07/22   [facility] Main [facility]    Report to admitting at 5:15 AM   Call this number if you have problems the morning of surgery [phone]   MORNING OF SURGERY DRINK:   DRINK 1 G2 drink BEFORE YOU LEAVE HOME, DRINK ALL OF THE  G2 DRINK AT ONE TIME.   NO SOLID FOOD AFTER 600 PM THE NIGHT BEFORE YOUR SURGERY. YOU MAY DRINK CLEAR FLUIDS. THE G2 DRINK YOU DRINK BEFORE YOU LEAVE HOME WILL BE THE LAST FLUIDS YOU DRINK BEFORE SURGERY.  PAIN IS EXPECTED AFTER SURGERY AND WILL NOT BE COMPLETELY ELIMINATED. AMBULATION AND TYLENOL WILL HELP REDUCE INCISIONAL AND GAS PAIN. MOVEMENT IS KEY!  YOU ARE EXPECTED TO BE OUT OF BED WITHIN 4 HOURS OF ADMISSION TO YOUR PATIENT ROOM.  SITTING IN THE RECLINER THROUGHOUT THE DAY IS IMPORTANT FOR DRINKING FLUIDS AND MOVING GAS THROUGHOUT THE GI TRACT.  [facility]PRESS[facility] ST[facility] UNLESS YOU ARE WALKING.   INCENTIVE SPIROMETER SHOULD BE USED EVERY HOUR WHILE AWAKE TO DECREASE POST-OPERATIVE COMPLICATIONS SUCH AS PNEUMONIA.  WHEN DISCHARGED HOME, IT IS IMPORTANT TO CONTINUE TO WALK EVERY HOUR AND USE THE INCENTIVE SPIROMETER EVERY HOUR.    You may have the following liquids until 4:30 AM DAY OF SURGERY  Water Non-Citrus  Juices (without pulp, NO RED) Carbonated Beverages Black Coffee (NO MILK/CREAM OR CREAMERS, sugar ok)  Clear Tea (NO MILK/CREAM OR CREAMERS, sugar ok) regular and decaf                             Plain Jell-O (NO RED)                                           Fruit ices (not with fruit pulp, NO RED)                                     Popsicles (NO RED)                                                               Sports drinks like Gatorade (NO RED)               The day of surgery:  Drink ONE (1) Pre-Surgery G2 at 4:30 AM the morning of  surgery. Drink in one sitting. Do not sip.  This drink was given to you during your hospital  pre-op appointment visit. Nothing else to drink after completing the  Pre-Surgery G2.          If you have questions, please contact your surgeon's office.   FOLLOW BOWEL PREP AND ANY ADDITIONAL PRE OP INSTRUCTIONS YOU RECEIVED FROM YOUR SURGEON'S OFFICE!!!     Oral Hygiene is also important to reduce your risk of infection.                                    Remember - BRUSH YOUR TEETH THE MORNING OF SURGERY WITH YOUR REGULAR TOOTHPASTE   Take these medicines the morning of surgery with A SIP OF WATER: Inhalers, Pantoprazole, Prednisone                               You may not have any metal on your body including hair pins, jewelry, and body piercing             Do not wear make-up, lotions, powders, perfumes, or deodorant  Do not wear nail polish including gel and S&S, artificial/acrylic nails, or any other type of covering on natural nails including finger and toenails. If you have artificial nails, gel coating, etc. that needs to be removed by a nail salon please have this removed prior to surgery or surgery may need to be canceled/ delayed if the surgeon/ anesthesia feels like they are unable to be safely monitored.   Do not shave  48 hours prior to surgery.    Do not bring valuables to the hospital. [facility] IS NOT             RESPONSIBLE    FOR VALUABLES.   Contacts, dentures or bridgework may not be worn into surgery.  DO NOT BRING YOUR HOME MEDICATIONS TO THE HOSPITAL. PHARMACY WILL DISPENSE MEDICATIONS LISTED ON YOUR MEDICATION LIST TO YOU DURING YOUR ADMISSION IN THE HOSPITAL!    Patients discharged on the day of surgery will not be allowed to drive home.  Someone NEEDS to stay with you for the first 24 hours after anesthesia.              Please read over the following fact sheets you were given: IF YOU HAVE QUESTIONS ABOUT YOUR PRE-OP INSTRUCTIONS PLEASE CALL [phone]- [facility] - Preparing for Surgery Before surgery, you can play an important role.  Because skin is not sterile, your skin needs to be as free of germs as possible.  You can reduce the number of germs on your skin by washing with CHG (chlorahexidine gluconate) soap before surgery.  CHG is an antiseptic cleaner which kills germs and bonds with the skin to continue killing germs even after washing. Please DO NOT use if you have an allergy to CHG or antibacterial soaps.  If your skin becomes reddened/irritated stop using the CHG and inform your nurse when you arrive at Short Stay. Do not shave (including legs and underarms) for at least 48 hours prior to the first CHG shower.  You may shave your face/neck.  Please follow these instructions carefully:  1.  Shower with CHG Soap the night before surgery and the  morning of surgery.  2.  If you choose to wash your hair, wash  your hair first as usual with your normal  shampoo.  3.  After you shampoo, rinse your hair and body thoroughly to remove the shampoo.                             4.  Use CHG as you would any other liquid soap.  You can apply chg directly to the skin and wash.  Gently with a scrungie or clean washcloth.  5.  Apply the CHG Soap to your body ONLY FROM THE NECK DOWN.   Do   not use on face/ open                           Wound or open sores. Avoid contact with eyes, ears mouth and    genitals (private parts).                       Wash face,  Genitals (private parts) with your normal soap.             6.  Wash thoroughly, paying special attention to the area where your    surgery  will be performed.  7.  Thoroughly rinse your body with warm water from the neck down.  8.  DO NOT shower/wash with your normal soap after using and rinsing off the CHG Soap.                9.  Pat yourself dry with a clean towel.            10.  Wear clean pajamas.            11.  Place clean sheets on your bed the night of your first shower and do not  sleep with pets. Day of Surgery : Do not apply any lotions/deodorants the morning of surgery.  Please wear clean clothes to the hospital/surgery center.  FAILURE TO FOLLOW THESE INSTRUCTIONS MAY RESULT IN THE CANCELLATION OF YOUR SURGERY  PATIENT SIGNATURE_________________________________  NURSE SIGNATURE__________________________________  ________________________________________________________________________   WHAT IS A BLOOD TRANSFUSION? Blood Transfusion Information  A transfusion is the replacement of blood or some of its parts. Blood is made up of multiple cells which provide different functions. Red blood cells carry oxygen and are used for blood loss replacement. White blood cells fight against infection. Platelets control bleeding. Plasma helps clot blood. Other blood products are available for specialized needs, such as hemophilia or other clotting disorders. BEFORE THE TRANSFUSION  Who gives blood for transfusions?  Healthy volunteers who are fully evaluated to make sure their blood is safe. This is blood bank blood. Transfusion therapy is the safest it has ever been in the practice of medicine. Before blood is taken from a donor, a complete history is taken to make sure that person has no history of diseases nor engages in risky social behavior (examples are intravenous drug use or sexual activity with multiple partners).  The donor's travel history is screened to minimize risk of transmitting infections, such as malaria. The donated blood is tested for signs of infectious diseases, such as HIV and hepatitis. The blood is then tested to be sure it is compatible with you in order to minimize the chance of a transfusion reaction. If you or a relative donates blood, this is often done in anticipation of surgery and is not appropriate for emergency situations. It takes many days to  process the donated blood. RISKS AND COMPLICATIONS Although transfusion therapy is very safe and saves many lives, the main dangers of transfusion include:  Getting an infectious disease. Developing a transfusion reaction. This is an allergic reaction to something in the blood you were given. Every precaution is taken to prevent this. The decision to have a blood transfusion has been considered carefully by your caregiver before blood is given. Blood is not given unless the benefits outweigh the risks. AFTER THE TRANSFUSION Right after receiving a blood transfusion, you will usually feel much better and more energetic. This is especially true if your red blood cells have gotten low (anemic). The transfusion raises the level of the red blood cells which carry oxygen, and this usually causes an energy increase. The nurse administering the transfusion will monitor you carefully for complications. HOME CARE INSTRUCTIONS  No special instructions are needed after a transfusion. You may find your energy is better. Speak with your caregiver about any limitations on activity for underlying diseases you may have. SEEK MEDICAL CARE IF:  Your condition is not improving after your transfusion. You develop redness or irritation at the intravenous (IV) site. SEEK IMMEDIATE MEDICAL CARE IF:  Any of the following symptoms occur over the next 12 hours: Shaking chills. You have a temperature by mouth above 102 F (38.9 C), not controlled by medicine. Chest,  back, or muscle pain. People around you feel you are not acting correctly or are confused. Shortness of breath or difficulty breathing. Dizziness and fainting. You get a rash or develop hives. You have a decrease in urine output. Your urine turns a dark color or changes to pink, red, or brown. Any of the following symptoms occur over the next 10 days: You have a temperature by mouth above 102 F (38.9 C), not controlled by medicine. Shortness of breath. Weakness after normal activity. The white part of the eye turns yellow (jaundice). You have a decrease in the amount of urine or are urinating less often. Your urine turns a dark color or changes to pink, red, or brown. Document Released: 10/03/2000 Document Revised: 12/29/2011 Document Reviewed: 05/22/2008 [employer] Patient Information 2014 [place], [place].  _______________________________________________________________________   Incentive Spirometer  An incentive spirometer is a tool that can help keep your lungs clear and active. This tool measures how well you are filling your lungs with each breath. Taking long deep breaths may help reverse or decrease the chance of developing breathing (pulmonary) problems (especially infection) following: A long period of time when you are unable to move or be active. BEFORE THE PROCEDURE  If the spirometer includes an indicator to show your best effort, your nurse or respiratory therapist will set it to a desired goal. If possible, sit up straight or lean slightly forward. Try not to slouch. Hold the incentive spirometer in an upright position. INSTRUCTIONS FOR USE  Sit on the edge of your bed if possible, or sit up as far as you can in bed or on a chair. Hold the incentive spirometer in an upright position. Breathe out normally. Place the mouthpiece in your mouth and seal your lips tightly around it. Breathe in slowly and as deeply as possible, raising the piston or the ball toward the top of  the column. Hold your breath for 3-5 seconds or for as long as possible. Allow the piston or ball to fall to the bottom of the column. Remove the mouthpiece from your mouth and breathe out normally. Rest for a few seconds and  repeat Steps 1 through 7 at least 10 times every 1-2 hours when you are awake. Take your time and take a few normal breaths between deep breaths. The spirometer may include an indicator to show your best effort. Use the indicator as a goal to work toward during each repetition. After each set of 10 deep breaths, practice coughing to be sure your lungs are clear. If you have an incision (the cut made at the time of surgery), support your incision when coughing by placing a pillow or rolled up towels firmly against it. Once you are able to get out of bed, walk around indoors and cough well. You may stop using the incentive spirometer when instructed by your caregiver.  RISKS AND COMPLICATIONS Take your time so you do not get dizzy or light-headed. If you are in pain, you may need to take or ask for pain medication before doing incentive spirometry. It is harder to take a deep breath if you are having pain. AFTER USE Rest and breathe slowly and easily. It can be helpful to keep track of a log of your progress. Your caregiver can provide you with a simple table to help with this. If you are using the spirometer at home, follow these instructions: SEEK MEDICAL CARE IF:  You are having difficultly using the spirometer. You have trouble using the spirometer as often as instructed. Your pain medication is not giving enough relief while using the spirometer. You develop fever of 100.5 F (38.1 C) or higher. SEEK IMMEDIATE MEDICAL CARE IF:  You cough up bloody sputum that had not been present before. You develop fever of 102 F (38.9 C) or greater. You develop worsening pain at or near the incision site. MAKE SURE YOU:  Understand these instructions. Will watch your  condition. Will get help right away if you are not doing well or get worse. Document Released: 02/16/2007 Document Revised: 12/29/2011 Document Reviewed: 04/19/2007 [facility] Patient Information 2014 [place], [place].   ________________________________________________________________________

## 2022-06-25 NOTE — Progress Notes (Addendum)
COVID Vaccine Completed: yes x4  Date of COVID positive in last 90 days: no  PCP - Danelle Berry, PA Cardiologist - Debbe Odea, MD  Chest x-ray - 04/16/22 Epic EKG - 07/19/21 Epic  Stress Test - years ago per pt ECHO - 08/08/21 Epic Cardiac Cath - n/a Pacemaker/ICD device last checked: n/a Spinal Cord Stimulator:n/a  Bowel Prep - no solids after 6pm the night before surgery  Sleep Study - n/a CPAP -   Fasting Blood Sugar - Gestational DM Checks Blood Sugar _____ times a day  Blood Thinner Instructions: n/a Aspirin Instructions: Last Dose:  Activity level: Can go up a flight of stairs and perform activities of daily living without stopping and without symptoms of chest pain. SOB due to history of asthma, not new   Anesthesia review: Splenic artery aneurysm and pneumonia 03/25/22, asthma, SIRS, WBC 16.7  Patient denies shortness of breath, fever, cough and chest pain at PAT appointment  Patient verbalized understanding of instructions that were given to them at the PAT appointment. Patient was also instructed that they will need to review over the PAT instructions again at home before surgery.

## 2022-06-26 ENCOUNTER — Encounter (HOSPITAL_COMMUNITY)
Admission: RE | Admit: 2022-06-26 | Discharge: 2022-06-26 | Disposition: A | Discharge status: Home / Self Care | Payer: 59 | Source: Ambulatory Visit | Attending: General Surgery | Admitting: General Surgery

## 2022-06-26 ENCOUNTER — Encounter (HOSPITAL_COMMUNITY): Payer: Self-pay

## 2022-06-26 VITALS — BP 137/95 | HR 92 | Temp 98.8°F | Resp 12 | Ht 62.5 in | Wt 221.2 lb

## 2022-06-26 DIAGNOSIS — Z8679 Personal history of other diseases of the circulatory system: Secondary | ICD-10-CM | POA: Insufficient documentation

## 2022-06-26 DIAGNOSIS — D735 Infarction of spleen: Secondary | ICD-10-CM | POA: Diagnosis not present

## 2022-06-26 DIAGNOSIS — Z01812 Encounter for preprocedural laboratory examination: Secondary | ICD-10-CM | POA: Insufficient documentation

## 2022-06-26 DIAGNOSIS — R6510 Systemic inflammatory response syndrome (SIRS) of non-infectious origin without acute organ dysfunction: Secondary | ICD-10-CM

## 2022-06-26 DIAGNOSIS — Z79899 Other long term (current) drug therapy: Secondary | ICD-10-CM | POA: Diagnosis not present

## 2022-06-26 DIAGNOSIS — I728 Aneurysm of other specified arteries: Secondary | ICD-10-CM

## 2022-06-26 HISTORY — DX: Unspecified osteoarthritis, unspecified site: M19.90

## 2022-06-26 HISTORY — DX: Pneumonia, unspecified organism: J18.9

## 2022-06-26 LAB — CBC WITH DIFFERENTIAL/PLATELET
Abs Immature Granulocytes: 0.25 10*3/uL — ABNORMAL HIGH (ref 0.00–0.07)
Basophils Absolute: 0.1 10*3/uL (ref 0.0–0.1)
Basophils Relative: 1 %
Eosinophils Absolute: 0.1 10*3/uL (ref 0.0–0.5)
Eosinophils Relative: 1 %
HCT: 38.3 % (ref 36.0–46.0)
Hemoglobin: 12.1 g/dL (ref 12.0–15.0)
Immature Granulocytes: 2 %
Lymphocytes Relative: 31 %
Lymphs Abs: 5.1 10*3/uL — ABNORMAL HIGH (ref 0.7–4.0)
MCH: 25.6 pg — ABNORMAL LOW (ref 26.0–34.0)
MCHC: 31.6 g/dL (ref 30.0–36.0)
MCV: 81.1 fL (ref 80.0–100.0)
Monocytes Absolute: 1.2 10*3/uL — ABNORMAL HIGH (ref 0.1–1.0)
Monocytes Relative: 7 %
Neutro Abs: 9.9 10*3/uL — ABNORMAL HIGH (ref 1.7–7.7)
Neutrophils Relative %: 58 %
Platelets: 416 10*3/uL — ABNORMAL HIGH (ref 150–400)
RBC: 4.72 MIL/uL (ref 3.87–5.11)
RDW: 14.3 % (ref 11.5–15.5)
WBC: 16.7 10*3/uL — ABNORMAL HIGH (ref 4.0–10.5)
nRBC: 0 % (ref 0.0–0.2)

## 2022-06-26 LAB — COMPREHENSIVE METABOLIC PANEL
ALT: 14 U/L (ref 0–44)
AST: 13 U/L — ABNORMAL LOW (ref 15–41)
Albumin: 3.4 g/dL — ABNORMAL LOW (ref 3.5–5.0)
Alkaline Phosphatase: 21 U/L — ABNORMAL LOW (ref 38–126)
Anion gap: 8 (ref 5–15)
BUN: 17 mg/dL (ref 6–20)
CO2: 25 mmol/L (ref 22–32)
Calcium: 9 mg/dL (ref 8.9–10.3)
Chloride: 107 mmol/L (ref 98–111)
Creatinine, Ser: 0.87 mg/dL (ref 0.44–1.00)
GFR, Estimated: >60 mL/min (ref >60)
Glucose, Bld: 87 mg/dL (ref 70–99)
Potassium: 3.5 mmol/L (ref 3.5–5.1)
Sodium: 140 mmol/L (ref 135–145)
Total Bilirubin: 0.4 mg/dL (ref 0.3–1.2)
Total Protein: 6.7 g/dL (ref 6.5–8.1)

## 2022-06-26 LAB — TYPE AND SCREEN
ABO/RH(D): O POS
Antibody Screen: NEGATIVE

## 2022-06-26 NOTE — Progress Notes (Signed)
WBC 16.7, results routed to Dr. Andrey Campanile

## 2022-07-06 NOTE — Anesthesia Preprocedure Evaluation (Addendum)
Anesthesia Evaluation  Patient identified by MRN, date of birth, ID band Patient awake    Reviewed: Allergy & Precautions, NPO status , Patient's Chart, lab work & pertinent test results  History of Anesthesia Complications Negative for: history of anesthetic complications  Airway Mallampati: II  TM Distance: >3 FB Neck ROM: Full    Dental  (+) Teeth Intact, Dental Advisory Given   Pulmonary asthma ,    Pulmonary exam normal        Cardiovascular hypertension, Normal cardiovascular exam  Splenic artery aneurysm s/p embolization  Echo 08/08/21: EF 60-65%, no RWMA, mild LVH, g1dd, normal RVSF, mild MR   Neuro/Psych  Headaches,    GI/Hepatic Neg liver ROS, GERD  ,  Endo/Other  Morbid obesity  Renal/GU negative Renal ROS  negative genitourinary   Musculoskeletal  (+) Arthritis ,   Abdominal   Peds  Hematology negative hematology ROS (+)   Anesthesia Other Findings   Reproductive/Obstetrics                            Anesthesia Physical Anesthesia Plan  ASA: 3  Anesthesia Plan: General   Post-op Pain Management: Tylenol PO (pre-op)*   Induction: Intravenous  PONV Risk Score and Plan: 4 or greater and Ondansetron, Dexamethasone, Treatment may vary due to age or medical condition and Midazolam  Airway Management Planned: Oral ETT  Additional Equipment: None  Intra-op Plan:   Post-operative Plan: Extubation in OR  Informed Consent: I have reviewed the patients History and Physical, chart, labs and discussed the procedure including the risks, benefits and alternatives for the proposed anesthesia with the patient or authorized representative who has indicated his/her understanding and acceptance.     Dental advisory given  Plan Discussed with:   Anesthesia Plan Comments:       Anesthesia Quick Evaluation

## 2022-07-06 NOTE — H&P (Signed)
PROVIDER: Kaislyn Gulas Leanne Chang, MD  MRN: [id number] DOB: 18-Apr-1986 DATE OF ENCOUNTER: 06/26/2022 Subjective   Chief Complaint: Follow-up (Pre op LGB surgery date 07/07/22)   History of Present Illness: Sheri Malone is a 36 y.o. female who is seen today for long-term follow-up regarding her obesity and GERD and small sliding hiatal hernia. She has completed the bariatric surgery evaluation process. 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 She has also had an upper endoscopy since I last saw her. Otherwise she denies any medical events other than going to urgent care after having an allergic reaction to shellfish requiring steroid treatment. She has ongoing reflux issues but it is no worse than what she is chronically managed with. She denies any nausea or vomiting. She denies any diarrhea or constipation. She has some mild occasional left-sided discomfort after having the splenic artery aneurysm embolized. No dysuria. No chest pain or chest pressure or shortness of breath.  Review of Systems: A complete review of systems was obtained from the patient. I have reviewed this information and discussed as appropriate with the patient. See HPI as well for other ROS.  ROS   Medical History: Past Medical History:  Diagnosis Date  Anemia  Asthma, unspecified asthma severity, unspecified whether complicated, unspecified whether persistent  GERD (gastroesophageal reflux disease)  Sliding hiatal hernia   There is no problem list on file for this patient.  Past Surgical History:  Procedure Laterality Date  CESAREAN SECTION  CHOLECYSTECTOMY  LAPAROSCOPIC TUBAL LIGATION    No Known Allergies  Current Outpatient Medications on File Prior to Visit  Medication Sig Dispense Refill  levocetirizine (XYZAL) 5 MG tablet TAKE 1 TABLET BY MOUTH EVERY DAY IN THE EVENING FOR 30 DAYS  montelukast (SINGULAIR) 10 mg tablet  montelukast 10 mg tablet TAKE 1 TABLET BY MOUTH EVERY DAY FOR 30 DAYS  norethindrone-ethinyl estradiol (JUNEL 1/20) 1-20 mg-mcg tablet Take 1 tablet by mouth once daily  pantoprazole (PROTONIX) 40 MG DR tablet pantoprazole 40 mg tablet,delayed release TAKE 1 TABLET (40 MG TOTAL) BY MOUTH IN THE MORNING.   No current facility-administered medications on file prior to visit.   Family History  Problem Relation Age of Onset  Obesity Mother  High blood pressure (Hypertension) Mother  Breast cancer Mother  Obesity Father  High blood pressure (Hypertension) Father  Diabetes Father  Obesity Sister  Obesity Brother    Social History   Tobacco Use  Smoking Status Never  Smokeless Tobacco Never    Social History   Socioeconomic History  Marital status: Unknown  Tobacco Use  Smoking status: Never  Smokeless tobacco: Never  Substance and Sexual Activity  Alcohol use: Yes  Comment: socially  Drug use: Never   Objective:   Vitals:  06/26/22 1444  BP: 136/88  Pulse: 96  Resp: 20  Temp: 36.6 C (97.9 F)  SpO2: 98%  Weight: (!) 101.2 kg (223 lb)  Height: 160 cm (5' 3" )   Body mass index is 39.5 kg/m.  Gen: alert, NAD, non-toxic appearing Pupils: equal, no scleral icterus Pulm: Lungs clear to auscultation, symmetric chest rise CV: regular rate and rhythm Abd: soft, nontender, nondistended. old trocar sites. No cellulitis. No incisional hernia Ext: no edema,  Skin: no rash, no jaundice  Labs, Imaging and Diagnostic Testing:  June 11 CT angio chest IMPRESSION: 1. No CT evidence of pulmonary artery embolus. 2. Bibasilar atelectasis or infiltrate. Trace left pleural effusion.  CT  abdomen pelvis June 10 IMPRESSION: Status post splenic artery aneurysm coiling. Large wedge-shaped low-density areas throughout the spleen compatible with splenic infarcts. This involves proximally half of the splenic volume.  Bibasilar atelectasis. Trace left pleural effusion.  JUNE  6th Pre-operative Diagnosis: Splenic artery aneurysm  Post-operative diagnosis: Same  Procedure(s) Performed: 1. Ultrasound guidance for vascular access right femoral artery 2. Catheter placement into splenic artery third order catheter placement 3. Aortogram and selective angiogram of the splenic artery and distal splenic vessels 4. Ruby coil embolization of splenic artery aneurysm with a total of 9 Ruby coils. 5. StarClose closure device right femoral artery  Labs from earlier today showed a white blood cell count of 16.7, hemoglobin 12.1, hematocrit 38, platelet count 416; c-Met-essentially within normal limits-albumin mildly low at 3.4  Labs from May 08, 2022 showed a normal TSH, hemoglobin A1c, lipid panel except for mildly low HDL level of 48, CBC was normal except for a mildly low hemoglobin hematocrit of 11 and 34.4, comprehensive metabolic panel was normal except for low alk phos and AST  Upper GI December 2021 IMPRESSION: 1. Moderate gastroesophageal reflux without evidence of mucosal ulceration, stricture or hiatal hernia. 2. Sessile polyps within the gastric fundus. Correlate with previous endoscopy and biopsies. 3. Otherwise normal examination.  ED visit from August 29 for hives  Upper endoscopy from February 2022 showed a Hill grade 3 valve, 1 cm hiatal hernia, irregular Z-line at 36 cm from the esophagus, multiple small sessile polyps in the gastric fundus and body-some were removed  Assessment and Plan:  Diagnoses and all orders for this visit:  Severe obesity (CMS-HCC)  GERD without esophagitis  Sliding hiatal hernia    SHe has completed her bariatric surgery evaluation. She has previously attended her bariatric she has previously attended her bariatric preop education class. We reviewed the findings of her upper GI and upper endoscopy which showed a small sliding hiatal hernia. We discussed that we would test for 1 intraoperatively. If found to have 1 we  would refer. At the time of surgery. I discussed what that would involve. We also rediscussed the typical hospitalization and the typical issues that we see after surgery. I offered to rediscussed the actual steps of the procedure along with risk and benefits but she declined since she is done ample research. She reviewed the surgical consent form and signed it. We also discussed her lab evaluation. We also discussed her recent ER evaluation for hives after eating shellfish. We discussed her recent use of steroids to treat her allergic reaction. We did discuss a small increased risk for wound healing issues and leak as a result. We discussed the pros and cons of this. She understands the risk and would like to proceed with surgery as scheduled. I think that is acceptable since she has no additional risk factors. We reviewed her splenic artery aneurysm treatment. I believe the small bump in her blood cell count is due to being on steroids temporarily. She has no other symptoms of systemic illness or signs of infection  We discussed the importance of the preoperative meal plan.  This patient encounter took 30 minute and endoscopys today to perform the following: take history, perform exam, review outside records, interpret imaging, counsel the patient on their diagnosis and document encounter, findings & plan in the EHR  No follow-ups on file.  Leighton Ruff. Redmond Pulling MD FACS General, Minimally Invasive, & Bariatric Surgery Electronically signed by Rudean Curt, MD at 06/26/2022 3:16 PM EDT

## 2022-07-07 ENCOUNTER — Other Ambulatory Visit: Payer: Self-pay

## 2022-07-07 ENCOUNTER — Observation Stay (HOSPITAL_COMMUNITY)
Admission: RE | Admit: 2022-07-07 | Discharge: 2022-07-08 | Disposition: A | Discharge status: Home / Self Care | Payer: 59 | Attending: General Surgery | Admitting: General Surgery

## 2022-07-07 ENCOUNTER — Ambulatory Visit (HOSPITAL_COMMUNITY): Payer: 59 | Admitting: Physician Assistant

## 2022-07-07 ENCOUNTER — Encounter (HOSPITAL_COMMUNITY): Payer: Self-pay | Admitting: General Surgery

## 2022-07-07 ENCOUNTER — Encounter (HOSPITAL_COMMUNITY)
Admission: RE | Disposition: A | Discharge status: Home / Self Care | Payer: Self-pay | Source: Home / Self Care | Attending: General Surgery

## 2022-07-07 ENCOUNTER — Ambulatory Visit (HOSPITAL_BASED_OUTPATIENT_CLINIC_OR_DEPARTMENT_OTHER): Payer: 59 | Admitting: Certified Registered Nurse Anesthetist

## 2022-07-07 DIAGNOSIS — Z9884 Bariatric surgery status: Secondary | ICD-10-CM

## 2022-07-07 DIAGNOSIS — Z6841 Body Mass Index (BMI) 40.0 and over, adult: Secondary | ICD-10-CM | POA: Diagnosis not present

## 2022-07-07 DIAGNOSIS — I10 Essential (primary) hypertension: Secondary | ICD-10-CM

## 2022-07-07 DIAGNOSIS — Z8679 Personal history of other diseases of the circulatory system: Secondary | ICD-10-CM

## 2022-07-07 DIAGNOSIS — K219 Gastro-esophageal reflux disease without esophagitis: Secondary | ICD-10-CM | POA: Insufficient documentation

## 2022-07-07 DIAGNOSIS — Z9049 Acquired absence of other specified parts of digestive tract: Secondary | ICD-10-CM | POA: Insufficient documentation

## 2022-07-07 DIAGNOSIS — D735 Infarction of spleen: Secondary | ICD-10-CM

## 2022-07-07 DIAGNOSIS — K449 Diaphragmatic hernia without obstruction or gangrene: Secondary | ICD-10-CM | POA: Diagnosis not present

## 2022-07-07 DIAGNOSIS — J45909 Unspecified asthma, uncomplicated: Secondary | ICD-10-CM | POA: Diagnosis not present

## 2022-07-07 DIAGNOSIS — M1990 Unspecified osteoarthritis, unspecified site: Secondary | ICD-10-CM

## 2022-07-07 HISTORY — PX: GASTRIC ROUX-EN-Y: SHX5262

## 2022-07-07 HISTORY — PX: HIATAL HERNIA REPAIR: SHX195

## 2022-07-07 LAB — CBC
HCT: 37.7 % (ref 36.0–46.0)
Hemoglobin: 11.5 g/dL — ABNORMAL LOW (ref 12.0–15.0)
MCH: 25.4 pg — ABNORMAL LOW (ref 26.0–34.0)
MCHC: 30.5 g/dL (ref 30.0–36.0)
MCV: 83.2 fL (ref 80.0–100.0)
Platelets: 363 10*3/uL (ref 150–400)
RBC: 4.53 MIL/uL (ref 3.87–5.11)
RDW: 14.4 % (ref 11.5–15.5)
WBC: 14.7 10*3/uL — ABNORMAL HIGH (ref 4.0–10.5)
nRBC: 0 % (ref 0.0–0.2)

## 2022-07-07 LAB — POCT PREGNANCY, URINE: Preg Test, Ur: NEGATIVE

## 2022-07-07 LAB — CREATININE, SERUM
Creatinine, Ser: 0.98 mg/dL (ref 0.44–1.00)
GFR, Estimated: >60 mL/min (ref >60)

## 2022-07-07 LAB — ABO/RH: ABO/RH(D): O POS

## 2022-07-07 LAB — HEMOGLOBIN AND HEMATOCRIT, BLOOD
HCT: 37.3 % (ref 36.0–46.0)
Hemoglobin: 11.8 g/dL — ABNORMAL LOW (ref 12.0–15.0)

## 2022-07-07 SURGERY — LAPAROSCOPIC ROUX-EN-Y GASTRIC BYPASS WITH UPPER ENDOSCOPY
Anesthesia: General

## 2022-07-07 MED ORDER — CHLORHEXIDINE GLUCONATE 4 % EX LIQD: Freq: Once | CUTANEOUS | Status: DC

## 2022-07-07 MED ORDER — IPRATROPIUM-ALBUTEROL 0.5-2.5 (3) MG/3ML IN SOLN: 3.0000 mL | Freq: Four times a day (QID) | RESPIRATORY_TRACT | Status: DC | PRN

## 2022-07-07 MED ORDER — ALBUTEROL SULFATE HFA 108 (90 BASE) MCG/ACT IN AERS: 1.0000 | INHALATION_SPRAY | Freq: Four times a day (QID) | RESPIRATORY_TRACT | Status: DC | PRN

## 2022-07-07 MED ORDER — ONDANSETRON HCL 4 MG/2ML IJ SOLN
INTRAMUSCULAR | Status: DC | PRN
  Administered 2022-07-07: 4 mg via INTRAVENOUS

## 2022-07-07 MED ORDER — BUPIVACAINE-EPINEPHRINE (PF) 0.25% -1:200000 IJ SOLN
INTRAMUSCULAR | Status: AC
  Filled 2022-07-07: qty 30

## 2022-07-07 MED ORDER — GLYCOPYRROLATE 0.2 MG/ML IJ SOLN
INTRAMUSCULAR | Status: AC
  Filled 2022-07-07: qty 1

## 2022-07-07 MED ORDER — ROCURONIUM BROMIDE 10 MG/ML (PF) SYRINGE
PREFILLED_SYRINGE | INTRAVENOUS | Status: DC | PRN
  Administered 2022-07-07: 20 mg via INTRAVENOUS
  Administered 2022-07-07: 100 mg via INTRAVENOUS

## 2022-07-07 MED ORDER — OXYCODONE HCL 5 MG PO TABS: 5.0000 mg | ORAL_TABLET | Freq: Once | ORAL | Status: DC | PRN

## 2022-07-07 MED ORDER — KCL IN DEXTROSE-NACL 20-5-0.45 MEQ/L-%-% IV SOLN
INTRAVENOUS | Status: DC
  Filled 2022-07-07 (×3): qty 1000

## 2022-07-07 MED ORDER — FENTANYL CITRATE PF 50 MCG/ML IJ SOSY
25.0000 ug | PREFILLED_SYRINGE | INTRAMUSCULAR | Status: DC | PRN
  Administered 2022-07-07 (×2): 50 ug via INTRAVENOUS

## 2022-07-07 MED ORDER — BUPIVACAINE LIPOSOME 1.3 % IJ SUSP
INTRAMUSCULAR | Status: DC | PRN
  Administered 2022-07-07: 20 mL

## 2022-07-07 MED ORDER — ACETAMINOPHEN 500 MG PO TABS
1000.0000 mg | ORAL_TABLET | ORAL | Status: AC
  Administered 2022-07-07: 1000 mg via ORAL
  Filled 2022-07-07: qty 2

## 2022-07-07 MED ORDER — MIDAZOLAM HCL 5 MG/5ML IJ SOLN
INTRAMUSCULAR | Status: DC | PRN
  Administered 2022-07-07: 2 mg via INTRAVENOUS

## 2022-07-07 MED ORDER — ONDANSETRON HCL 4 MG/2ML IJ SOLN: 4.0000 mg | Freq: Once | INTRAMUSCULAR | Status: DC | PRN

## 2022-07-07 MED ORDER — SODIUM CHLORIDE 0.9 % IV SOLN
2.0000 g | INTRAVENOUS | Status: AC
  Administered 2022-07-07: 2 g via INTRAVENOUS
  Filled 2022-07-07: qty 2

## 2022-07-07 MED ORDER — DEXAMETHASONE SODIUM PHOSPHATE 10 MG/ML IJ SOLN
INTRAMUSCULAR | Status: AC
  Filled 2022-07-07: qty 1

## 2022-07-07 MED ORDER — FENTANYL CITRATE (PF) 100 MCG/2ML IJ SOLN
INTRAMUSCULAR | Status: DC | PRN
  Administered 2022-07-07: 50 ug via INTRAVENOUS
  Administered 2022-07-07 (×2): 100 ug via INTRAVENOUS

## 2022-07-07 MED ORDER — PHENYLEPHRINE 80 MCG/ML (10ML) SYRINGE FOR IV PUSH (FOR BLOOD PRESSURE SUPPORT)
PREFILLED_SYRINGE | INTRAVENOUS | Status: AC
  Filled 2022-07-07: qty 10

## 2022-07-07 MED ORDER — BUPIVACAINE LIPOSOME 1.3 % IJ SUSP: 20.0000 mL | Freq: Once | INTRAMUSCULAR | Status: DC

## 2022-07-07 MED ORDER — SUGAMMADEX SODIUM 200 MG/2ML IV SOLN
INTRAVENOUS | Status: DC | PRN
  Administered 2022-07-07: 300 mg via INTRAVENOUS

## 2022-07-07 MED ORDER — PANTOPRAZOLE SODIUM 40 MG IV SOLR
40.0000 mg | Freq: Every day | INTRAVENOUS | Status: DC
  Administered 2022-07-07: 40 mg via INTRAVENOUS
  Filled 2022-07-07: qty 10

## 2022-07-07 MED ORDER — FENTANYL CITRATE PF 50 MCG/ML IJ SOSY
PREFILLED_SYRINGE | INTRAMUSCULAR | Status: AC
  Filled 2022-07-07: qty 1

## 2022-07-07 MED ORDER — MIDAZOLAM HCL 2 MG/2ML IJ SOLN
INTRAMUSCULAR | Status: AC
  Filled 2022-07-07: qty 2

## 2022-07-07 MED ORDER — MECLIZINE HCL 25 MG PO TABS: 12.5000 mg | ORAL_TABLET | Freq: Three times a day (TID) | ORAL | Status: DC | PRN

## 2022-07-07 MED ORDER — AMISULPRIDE (ANTIEMETIC) 5 MG/2ML IV SOLN
10.0000 mg | Freq: Once | INTRAVENOUS | Status: DC | PRN
Start: 2022-07-07 — End: 2022-07-07

## 2022-07-07 MED ORDER — 0.9 % SODIUM CHLORIDE (POUR BTL) OPTIME
TOPICAL | Status: DC | PRN
  Administered 2022-07-07: 1000 mL

## 2022-07-07 MED ORDER — OXYCODONE HCL 5 MG/5ML PO SOLN
5.0000 mg | Freq: Four times a day (QID) | ORAL | Status: DC | PRN
  Administered 2022-07-07 – 2022-07-08 (×4): 5 mg via ORAL
  Filled 2022-07-07 (×4): qty 5

## 2022-07-07 MED ORDER — PHENYLEPHRINE 80 MCG/ML (10ML) SYRINGE FOR IV PUSH (FOR BLOOD PRESSURE SUPPORT)
PREFILLED_SYRINGE | INTRAVENOUS | Status: DC | PRN
  Administered 2022-07-07: 120 ug via INTRAVENOUS
  Administered 2022-07-07: 80 ug via INTRAVENOUS
  Administered 2022-07-07: 120 ug via INTRAVENOUS
  Administered 2022-07-07: 80 ug via INTRAVENOUS

## 2022-07-07 MED ORDER — LIDOCAINE HCL (PF) 2 % IJ SOLN
INTRAMUSCULAR | Status: AC
  Filled 2022-07-07: qty 15

## 2022-07-07 MED ORDER — ALBUTEROL SULFATE (2.5 MG/3ML) 0.083% IN NEBU: 2.5000 mg | INHALATION_SOLUTION | Freq: Four times a day (QID) | RESPIRATORY_TRACT | Status: DC | PRN

## 2022-07-07 MED ORDER — DIPHENHYDRAMINE HCL 50 MG/ML IJ SOLN: 12.5000 mg | Freq: Three times a day (TID) | INTRAMUSCULAR | Status: DC | PRN

## 2022-07-07 MED ORDER — PROPOFOL 500 MG/50ML IV EMUL
INTRAVENOUS | Status: AC
  Filled 2022-07-07: qty 50

## 2022-07-07 MED ORDER — PROPOFOL 10 MG/ML IV BOLUS
INTRAVENOUS | Status: DC | PRN
  Administered 2022-07-07: 200 mg via INTRAVENOUS

## 2022-07-07 MED ORDER — KETOTIFEN FUMARATE 0.025 % OP SOLN
1.0000 [drp] | Freq: Two times a day (BID) | OPHTHALMIC | Status: DC
  Filled 2022-07-07: qty 5

## 2022-07-07 MED ORDER — OXYCODONE HCL 5 MG/5ML PO SOLN: 5.0000 mg | Freq: Once | ORAL | Status: DC | PRN

## 2022-07-07 MED ORDER — MOMETASONE FURO-FORMOTEROL FUM 200-5 MCG/ACT IN AERO
2.0000 | INHALATION_SPRAY | Freq: Two times a day (BID) | RESPIRATORY_TRACT | Status: DC
  Administered 2022-07-07 – 2022-07-08 (×2): 2 via RESPIRATORY_TRACT
  Filled 2022-07-07: qty 8.8

## 2022-07-07 MED ORDER — APREPITANT 40 MG PO CAPS
40.0000 mg | ORAL_CAPSULE | ORAL | Status: AC
  Administered 2022-07-07: 40 mg via ORAL
  Filled 2022-07-07: qty 1

## 2022-07-07 MED ORDER — PROPOFOL 10 MG/ML IV BOLUS
INTRAVENOUS | Status: AC
  Filled 2022-07-07: qty 20

## 2022-07-07 MED ORDER — ACETAMINOPHEN 160 MG/5ML PO SOLN
1000.0000 mg | Freq: Three times a day (TID) | ORAL | Status: DC
  Administered 2022-07-07: 1000 mg via ORAL
  Filled 2022-07-07: qty 40.6

## 2022-07-07 MED ORDER — AZELASTINE HCL 0.1 % NA SOLN
1.0000 | Freq: Every day | NASAL | Status: DC
  Filled 2022-07-07 (×2): qty 30

## 2022-07-07 MED ORDER — STERILE WATER FOR IRRIGATION IR SOLN
Status: DC | PRN
  Administered 2022-07-07: 2000 mL

## 2022-07-07 MED ORDER — LIDOCAINE HCL (PF) 2 % IJ SOLN
INTRAMUSCULAR | Status: DC | PRN
  Administered 2022-07-07: 1.5 mg/kg/h

## 2022-07-07 MED ORDER — SCOPOLAMINE 1 MG/3DAYS TD PT72
1.0000 | MEDICATED_PATCH | TRANSDERMAL | Status: DC
  Administered 2022-07-07: 1.5 mg via TRANSDERMAL
  Filled 2022-07-07: qty 1

## 2022-07-07 MED ORDER — DEXAMETHASONE SODIUM PHOSPHATE 4 MG/ML IJ SOLN
4.0000 mg | INTRAMUSCULAR | Status: AC
  Administered 2022-07-07: 4 mg via INTRAVENOUS

## 2022-07-07 MED ORDER — LIDOCAINE 2% (20 MG/ML) 5 ML SYRINGE
INTRAMUSCULAR | Status: DC | PRN
  Administered 2022-07-07: 100 mg via INTRAVENOUS

## 2022-07-07 MED ORDER — LACTATED RINGERS IR SOLN
Status: DC | PRN
  Administered 2022-07-07: 1000 mL

## 2022-07-07 MED ORDER — ACETAMINOPHEN 500 MG PO TABS
1000.0000 mg | ORAL_TABLET | Freq: Three times a day (TID) | ORAL | Status: DC
  Administered 2022-07-07 – 2022-07-08 (×3): 1000 mg via ORAL
  Filled 2022-07-07 (×3): qty 2

## 2022-07-07 MED ORDER — ORAL CARE MOUTH RINSE: 15.0000 mL | Freq: Once | OROMUCOSAL | Status: AC

## 2022-07-07 MED ORDER — MORPHINE SULFATE (PF) 2 MG/ML IV SOLN
1.0000 mg | INTRAVENOUS | Status: DC | PRN
  Administered 2022-07-08: 2 mg via INTRAVENOUS
  Filled 2022-07-07: qty 1

## 2022-07-07 MED ORDER — SODIUM CHLORIDE (PF) 0.9 % IJ SOLN
INTRAMUSCULAR | Status: AC
  Filled 2022-07-07: qty 50

## 2022-07-07 MED ORDER — HEPARIN SODIUM (PORCINE) 5000 UNIT/ML IJ SOLN
5000.0000 [IU] | Freq: Three times a day (TID) | INTRAMUSCULAR | Status: DC
  Administered 2022-07-07 – 2022-07-08 (×3): 5000 [IU] via SUBCUTANEOUS
  Filled 2022-07-07 (×3): qty 1

## 2022-07-07 MED ORDER — PROPOFOL 500 MG/50ML IV EMUL
INTRAVENOUS | Status: DC | PRN
  Administered 2022-07-07: 25 ug/kg/min via INTRAVENOUS

## 2022-07-07 MED ORDER — "VISTASEAL 4 ML SINGLE DOSE KIT "
4.0000 mL | PACK | Freq: Once | CUTANEOUS | Status: AC
  Administered 2022-07-07: 4 mL via TOPICAL
  Filled 2022-07-07: qty 4

## 2022-07-07 MED ORDER — HYDRALAZINE HCL 20 MG/ML IJ SOLN: 10.0000 mg | INTRAMUSCULAR | Status: DC | PRN

## 2022-07-07 MED ORDER — FENTANYL CITRATE (PF) 250 MCG/5ML IJ SOLN
INTRAMUSCULAR | Status: AC
  Filled 2022-07-07: qty 5

## 2022-07-07 MED ORDER — CHLORHEXIDINE GLUCONATE 0.12 % MT SOLN
15.0000 mL | Freq: Once | OROMUCOSAL | Status: AC
Start: 2022-07-07 — End: 2022-07-07
  Administered 2022-07-07: 15 mL via OROMUCOSAL

## 2022-07-07 MED ORDER — BUPIVACAINE LIPOSOME 1.3 % IJ SUSP
INTRAMUSCULAR | Status: AC
  Filled 2022-07-07: qty 20

## 2022-07-07 MED ORDER — HEPARIN SODIUM (PORCINE) 5000 UNIT/ML IJ SOLN
5000.0000 [IU] | INTRAMUSCULAR | Status: AC
  Administered 2022-07-07: 5000 [IU] via SUBCUTANEOUS
  Filled 2022-07-07: qty 1

## 2022-07-07 MED ORDER — ENSURE MAX PROTEIN PO LIQD
2.0000 [oz_av] | ORAL | Status: DC
  Administered 2022-07-08 (×5): 2 [oz_av] via ORAL

## 2022-07-07 MED ORDER — SUGAMMADEX SODIUM 500 MG/5ML IV SOLN
INTRAVENOUS | Status: AC
  Filled 2022-07-07: qty 5

## 2022-07-07 MED ORDER — INFLUENZA VAC SPLIT QUAD 0.5 ML IM SUSY: 0.5000 mL | PREFILLED_SYRINGE | INTRAMUSCULAR | Status: DC

## 2022-07-07 MED ORDER — BUPIVACAINE-EPINEPHRINE 0.25% -1:200000 IJ SOLN
INTRAMUSCULAR | Status: DC | PRN
  Administered 2022-07-07: 30 mL

## 2022-07-07 MED ORDER — KETAMINE HCL 10 MG/ML IJ SOLN
INTRAMUSCULAR | Status: DC | PRN
  Administered 2022-07-07: 10 mg via INTRAVENOUS
  Administered 2022-07-07: 40 mg via INTRAVENOUS

## 2022-07-07 MED ORDER — FIBRIN SEALANT 2 ML SINGLE DOSE KIT
2.0000 mL | PACK | Freq: Once | CUTANEOUS | Status: AC
  Administered 2022-07-07: 2 mL via TOPICAL
  Filled 2022-07-07: qty 2

## 2022-07-07 MED ORDER — ACETAMINOPHEN 500 MG PO TABS: 1000.0000 mg | ORAL_TABLET | Freq: Once | ORAL | Status: DC

## 2022-07-07 MED ORDER — SIMETHICONE 80 MG PO CHEW
80.0000 mg | CHEWABLE_TABLET | Freq: Four times a day (QID) | ORAL | Status: DC | PRN
  Administered 2022-07-07 – 2022-07-08 (×4): 80 mg via ORAL
  Filled 2022-07-07 (×4): qty 1

## 2022-07-07 MED ORDER — ONDANSETRON HCL 4 MG/2ML IJ SOLN
INTRAMUSCULAR | Status: AC
  Filled 2022-07-07: qty 2

## 2022-07-07 MED ORDER — LACTATED RINGERS IV SOLN: INTRAVENOUS | Status: DC

## 2022-07-07 MED ORDER — ONDANSETRON HCL 4 MG/2ML IJ SOLN: 4.0000 mg | Freq: Four times a day (QID) | INTRAMUSCULAR | Status: DC | PRN

## 2022-07-07 MED ORDER — KETAMINE HCL 50 MG/5ML IJ SOSY
PREFILLED_SYRINGE | INTRAMUSCULAR | Status: AC
  Filled 2022-07-07: qty 5

## 2022-07-07 SURGICAL SUPPLY — 101 items
APPLICATOR COTTON TIP 6 STRL (MISCELLANEOUS) ×1 IMPLANT
APPLICATOR COTTON TIP 6IN STRL (MISCELLANEOUS) ×1
APPLICATOR VISTASEAL 35 (MISCELLANEOUS) ×2 IMPLANT
APPLIER CLIP ROT 13.4 12 LRG (CLIP)
BAG COUNTER SPONGE SURGICOUNT (BAG) IMPLANT
BLADE EXTENDED COATED 6.5IN (ELECTRODE) IMPLANT
BLADE HEX COATED 2.75 (ELECTRODE) ×1 IMPLANT
BLADE SURG SZ11 CARB STEEL (BLADE) ×1 IMPLANT
CABLE HIGH FREQUENCY MONO STRZ (ELECTRODE) IMPLANT
CHLORAPREP W/TINT 26 (MISCELLANEOUS) ×2 IMPLANT
CLIP APPLIE ROT 13.4 12 LRG (CLIP) IMPLANT
CLIP SUT LAPRA TY ABSORB (SUTURE) ×2 IMPLANT
COVER MAYO STAND STRL (DRAPES) IMPLANT
CUTTER FLEX LINEAR 45M (STAPLE) IMPLANT
DEVICE SUT QUICK LOAD TK 5 (SUTURE) IMPLANT
DEVICE SUT TI-KNOT TK 5X26 (SUTURE) IMPLANT
DEVICE SUTURE ENDOST 10MM (ENDOMECHANICALS) ×1 IMPLANT
DRAIN PENROSE 0.25X18 (DRAIN) ×1 IMPLANT
DRAIN PENROSE 0.5X18 (DRAIN) ×1 IMPLANT
DRAPE LAPAROSCOPIC ABDOMINAL (DRAPES) ×1 IMPLANT
DRAPE UTILITY XL STRL (DRAPES) ×1 IMPLANT
DRAPE WARM FLUID 44X44 (DRAPES) IMPLANT
DRSG TEGADERM 2-3/8X2-3/4 SM (GAUZE/BANDAGES/DRESSINGS) ×6 IMPLANT
ELECT REM PT RETURN 15FT ADLT (MISCELLANEOUS) ×1 IMPLANT
GAUZE 4X4 16PLY ~~LOC~~+RFID DBL (SPONGE) ×1 IMPLANT
GAUZE SPONGE 2X2 8PLY STRL LF (GAUZE/BANDAGES/DRESSINGS) ×1 IMPLANT
GAUZE SPONGE 4X4 12PLY STRL (GAUZE/BANDAGES/DRESSINGS) ×1 IMPLANT
GLOVE BIO SURGEON STRL SZ7.5 (GLOVE) ×1 IMPLANT
GLOVE INDICATOR 8.0 STRL GRN (GLOVE) ×1 IMPLANT
GOWN STRL REUS W/ TWL XL LVL3 (GOWN DISPOSABLE) ×4 IMPLANT
GOWN STRL REUS W/TWL XL LVL3 (GOWN DISPOSABLE) ×4
HANDLE SUCTION POOLE (INSTRUMENTS) IMPLANT
IRRIG SUCT STRYKERFLOW 2 WTIP (MISCELLANEOUS) ×1
IRRIGATION SUCT STRKRFLW 2 WTP (MISCELLANEOUS) ×1 IMPLANT
KIT BASIN OR (CUSTOM PROCEDURE TRAY) ×1 IMPLANT
KIT GASTRIC LAVAGE 34FR ADT (SET/KITS/TRAYS/PACK) ×1 IMPLANT
KIT TURNOVER KIT A (KITS) IMPLANT
MARKER SKIN DUAL TIP RULER LAB (MISCELLANEOUS) ×1 IMPLANT
MAT PREVALON FULL STRYKER (MISCELLANEOUS) ×1 IMPLANT
NDL SPNL 22GX3.5 QUINCKE BK (NEEDLE) ×1 IMPLANT
NEEDLE SPNL 22GX3.5 QUINCKE BK (NEEDLE) ×1 IMPLANT
NS IRRIG 1000ML POUR BTL (IV SOLUTION) ×1 IMPLANT
PACK CARDIOVASCULAR III (CUSTOM PROCEDURE TRAY) ×1 IMPLANT
PACK GENERAL/GYN (CUSTOM PROCEDURE TRAY) ×1 IMPLANT
PENCIL SMOKE EVACUATOR (MISCELLANEOUS) IMPLANT
RELOAD 45 VASCULAR/THIN (ENDOMECHANICALS) IMPLANT
RELOAD ENDO STITCH 2.0 (ENDOMECHANICALS) ×9
RELOAD STAPLE 45 2.5 WHT GRN (ENDOMECHANICALS) IMPLANT
RELOAD STAPLE 45 3.5 BLU ETS (ENDOMECHANICALS) IMPLANT
RELOAD STAPLE 60 2.6 WHT THN (STAPLE) ×2 IMPLANT
RELOAD STAPLE 60 3.6 BLU REG (STAPLE) ×2 IMPLANT
RELOAD STAPLE 60 3.8 GOLD REG (STAPLE) ×1 IMPLANT
RELOAD STAPLE TA45 3.5 REG BLU (ENDOMECHANICALS) IMPLANT
RELOAD STAPLER BLUE 60MM (STAPLE) ×4 IMPLANT
RELOAD STAPLER GOLD 60MM (STAPLE) ×1 IMPLANT
RELOAD STAPLER WHITE 60MM (STAPLE) ×2 IMPLANT
RELOAD SUT SNGL STCH ABSRB 2-0 (ENDOMECHANICALS) ×5 IMPLANT
RELOAD SUT SNGL STCH BLK 2-0 (ENDOMECHANICALS) ×4 IMPLANT
SCISSORS LAP 5X45 EPIX DISP (ENDOMECHANICALS) ×1 IMPLANT
SET TUBE SMOKE EVAC HIGH FLOW (TUBING) ×1 IMPLANT
SHEARS HARMONIC ACE PLUS 45CM (MISCELLANEOUS) ×1 IMPLANT
SLEEVE ADV FIXATION 12X100MM (TROCAR) ×2 IMPLANT
SLEEVE ADV FIXATION 5X100MM (TROCAR) IMPLANT
SOL ANTI FOG 6CC (MISCELLANEOUS) ×1 IMPLANT
SOLUTION ANTI FOG 6CC (MISCELLANEOUS) ×1
STAPLER ECHELON BIOABSB 60 FLE (MISCELLANEOUS) IMPLANT
STAPLER ECHELON LONG 3000 60 (ENDOMECHANICALS) IMPLANT
STAPLER ECHELON LONG 60 440 (INSTRUMENTS) ×1 IMPLANT
STAPLER RELOAD BLUE 60MM (STAPLE) ×4
STAPLER RELOAD GOLD 60MM (STAPLE) ×1
STAPLER RELOAD WHITE 60MM (STAPLE) ×2
STAPLER VISISTAT 35W (STAPLE) ×1 IMPLANT
STRIP CLOSURE SKIN 1/2X4 (GAUZE/BANDAGES/DRESSINGS) ×1 IMPLANT
SUCTION POOLE HANDLE (INSTRUMENTS)
SURGILUBE 2OZ TUBE FLIPTOP (MISCELLANEOUS) ×1 IMPLANT
SUT MNCRL AB 4-0 PS2 18 (SUTURE) ×1 IMPLANT
SUT RELOAD ENDO STITCH 2 48X1 (ENDOMECHANICALS) ×5
SUT RELOAD ENDO STITCH 2.0 (ENDOMECHANICALS) ×4
SUT SILK 2 0 (SUTURE)
SUT SILK 2 0 SH CR/8 (SUTURE) IMPLANT
SUT SILK 2-0 18XBRD TIE 12 (SUTURE) IMPLANT
SUT SILK 3 0 (SUTURE)
SUT SILK 3 0 SH CR/8 (SUTURE) IMPLANT
SUT SILK 3-0 18XBRD TIE 12 (SUTURE) IMPLANT
SUT SURGIDAC NAB ES-9 0 48 120 (SUTURE) IMPLANT
SUT VIC AB 2-0 SH 27 (SUTURE) ×1
SUT VIC AB 2-0 SH 27X BRD (SUTURE) ×1 IMPLANT
SUT VICRYL 2 0 18  UND BR (SUTURE)
SUT VICRYL 2 0 18 UND BR (SUTURE) IMPLANT
SUTURE RELOAD END STTCH 2 48X1 (ENDOMECHANICALS) ×5 IMPLANT
SUTURE RELOAD ENDO STITCH 2.0 (ENDOMECHANICALS) ×4 IMPLANT
SYR 20ML LL LF (SYRINGE) ×2 IMPLANT
TOWEL OR 17X26 10 PK STRL BLUE (TOWEL DISPOSABLE) ×2 IMPLANT
TOWEL OR NON WOVEN STRL DISP B (DISPOSABLE) ×1 IMPLANT
TRAY FOLEY MTR SLVR 16FR STAT (SET/KITS/TRAYS/PACK) IMPLANT
TROCAR ADV FIXATION 12X100MM (TROCAR) ×1 IMPLANT
TROCAR ADV FIXATION 5X100MM (TROCAR) ×1 IMPLANT
TROCAR XCEL NON-BLD 5MMX100MML (ENDOMECHANICALS) IMPLANT
TROCAR Z-THREAD OPTICAL 5X100M (TROCAR) ×1 IMPLANT
TUBING CONNECTING 10 (TUBING) ×2 IMPLANT
YANKAUER SUCT BULB TIP NO VENT (SUCTIONS) IMPLANT

## 2022-07-07 NOTE — Progress Notes (Signed)
   07/07/22 1244  Assess: MEWS Score  Temp 98.4 F (36.9 C)  BP (!) 166/102  MAP (mmHg) 118  Pulse Rate (!) 135 (pt was hacking up sputum because she states it is hard to swallow)  Resp 18  SpO2 100 %  Assess: MEWS Score  MEWS Temp 0  MEWS Systolic 0  MEWS Pulse 3  MEWS RR 0  MEWS LOC 0  MEWS Score 3  MEWS Score Color Yellow  Assess: if the MEWS score is Yellow or Red  Were vital signs taken at a resting state? No  Focused Assessment Change from prior assessment (see assessment flowsheet)  Does the patient meet 2 or more of the SIRS criteria? No  MEWS guidelines implemented *See Row Information* Yes (Rapid will be notified and Anesthesiologist)  Treat  MEWS Interventions Escalated (See documentation below);Other (Comment) Editor, commissioning to assess pt)  Pain Scale 0-10  Pain Score 0  Take Vital Signs  Increase Vital Sign Frequency  Yellow: Q 2hr X 2 then Q 4hr X 2, if remains yellow, continue Q 4hrs  Escalate  MEWS: Escalate Yellow: discuss with charge nurse/RN and consider discussing with provider and RRT  Notify: Charge Nurse/RN  Name of Charge Nurse/RN Notified Manuela Schwartz RN  Date Charge Nurse/RN Notified 07/07/22  Time Charge Nurse/RN Notified 1244  Assess: SIRS CRITERIA  SIRS Temperature  0  SIRS Pulse 1  SIRS Respirations  0  SIRS WBC 1  SIRS Score Sum  2

## 2022-07-07 NOTE — TOC Progression Note (Signed)
Transition of Care [facility]) - Progression Note    Patient Details  Name: ANALYSE ANGST MRN: [id number] Date of Birth: Mar 29, 1986  Transition of Care [facility]) CM/SW Contact  Servando Snare, [place] Phone Number: 07/07/2022, 12:50 PM  Clinical Narrative:    Transition of Care (TOC) Screening Note   Patient Details  Name: LYNNSIE LINDERS Date of Birth: 08-Jun-1986   Transition of Care [facility]) CM/SW Contact:    Servando Snare, LCSW Phone Number: 07/07/2022, 12:50 PM    Transition of Care Department [facility]) has reviewed patient and no TOC needs have been identified at this time. We will continue to monitor patient advancement through interdisciplinary progression rounds. If new patient transition needs arise, please place a TOC consult.           Expected Discharge Plan and Services                                                 Social Determinants of Health (SDOH) Interventions    Readmission Risk Interventions     No data to display

## 2022-07-07 NOTE — Op Note (Signed)
Sheri Malone [id number] Jun 17, 1986. 07/07/2022  Preoperative diagnosis:  Severe obesity (BMI 40)  GERD without esophagitis  Sliding hiatal hernia Low HDL  Postoperative  diagnosis:  1. same  Surgical procedure: Laparoscopic Roux-en-Y gastric bypass (ante-colic, ante-gastric) with sliding hiatal hernia repair; upper endoscopy  Surgeon: Gayland Curry, M.D. FACS  Asst.: Gurney Maxin MD FACS  Anesthesia: General plus exparel/marcaine mix  Complications: None   EBL: Minimal   Drains: None   Disposition: PACU in good condition   Indications for procedure: 36 y.o. yo female with morbid obesity who has been unsuccessful at sustained weight loss. The patient's comorbidities are listed above. We discussed the risk and benefits of surgery including but not limited to anesthesia risk, bleeding, infection, blood clot formation, anastomotic leak, anastomotic stricture, ulcer formation, death, respiratory complications, intestinal blockage, internal hernia, gallstone formation, vitamin and nutritional deficiencies, injury to surrounding structures, failure to lose weight and mood changes.   Description of procedure: Patient is brought to the operating room and general anesthesia induced. The patient had received preoperative broad-spectrum IV antibiotics and subcutaneous heparin. The abdomen was widely sterilely prepped with Chloraprep and draped. Patient timeout was performed and correct patient and procedure confirmed. Access was obtained with a 5 mm Optiview trocar in the left upper quadrant and pneumoperitoneum established without difficulty. Under direct vision 12 mm trocars were placed laterally in the right upper quadrant, right upper quadrant midclavicular line, and to the left and above the umbilicus for the camera port. A 5 mm trocar was placed laterally in the left upper quadrant.  Exparel/marcaine mix was infiltrated in bilateral lateral abdominal walls as a TAP block for  postoperative pain relief.  The patient had omentum adhered to her umbilicus and to her lower midline.  This was taken down with harmonic scalpel.  I did not see any fascial defect within the abdominal wall after taking this down. The omentum was brought into the upper abdomen and the transverse mesocolon elevated and the ligament of Treitz clearly identified. A 50 cm biliopancreatic limb was then carefully measured from the ligament of Treitz. The small intestine was divided at this point with a single firing of the white load linear stapler. A Penrose drain was sutured to the end of the Roux-en-Y limb for later identification. A 100 cm Roux-en-Y limb was then carefully measured. At this point a side-to-side anastomosis was created between the Roux limb and the end of the biliopancreatic limb. This was accomplished with a single firing of the 60 mm white load linear stapler. The common enterotomy was closed with a running 2-0 Vicryl begun at either end of the enterotomy and tied centrally.  Vistaseal tissue sealant was placed over the anastomosis. The mesenteric defect was then closed with running 2-0 silk. The omentum was then divided with the harmonic scalpel up towards the transverse colon to allow mobility of the Roux limb toward the gastric pouch. The patient was then placed in steep reversed Trendelenburg. Through a 5 mm subxiphoid site the [facility] retractor was placed and the left lobe of the liver elevated with excellent exposure of the upper stomach and hiatus.   The patient had preoperative imaging which revealed a small sliding hiatal hernia.  There was evidence of a dimple at her anterior diaphragm.  The gastrohepatic ligament was incised with harmonic scalpel.  We identified the right crura of the diaphragm.  I gently incised the peritoneum and fat overlying the right crus.  Then using some gentle blunt dissection I  identified the confluence of the left and right crura.  There was a small defect  between the 2 pillars of the diaphragm.  The esophagus had been identified and lifted up out of the way by my assistant.  Ended up placing 1-0 Ethibond Surgidac to reapproximate the left and right crura secured with a titanium tie knot.  The angle of Hiss was then mobilized with the harmonic scalpel. A 5-6 cm gastric pouch was then carefully measured along the lesser curve of the stomach (3 blood vessels down along the lesser curvature of the stomach). Dissection was carried along the lesser curve at this point with the Harmonic scalpel working carefully back toward the lesser sac at right angles to the lesser curve. The free lesser sac was then entered. After being sure all tubes were removed from the stomach an initial firing of the gold load 60 mm linear stapler was fired at right angles across the lesser curve for about 4 cm. The gastric pouch was further mobilized posteriorly and then the pouch was completed with 3 further firings of the 60 mm blue load linear stapler up through the previously dissected angle of His (the last 2 firings were reinforced with staple line reinforcement with seam guard). It was ensured that the pouch was completely mobilized away from the gastric remnant. This created a nice tubular 5 cm gastric pouch. The Roux limb was then brought up in an antecolic fashion with the candycane facing to the patient's left without undue tension. The gastrojejunostomy was created with an initial posterior row of 2-0 Vicryl between the Roux limb and the staple line of the gastric pouch. Enterotomies were then made in the gastric pouch and the Roux limb with the harmonic scalpel and at approximately 2-2-1/2 cm anastomosis was created with a single firing of the 27mm blue load linear stapler. The staple line was inspected and was intact without bleeding. The common enterotomy was then closed with running 2-0 Vicryl begun at either end and tied centrally. The Ewall tube was then easily passed through  the anastomosis and an outer anterior layer of running 2-0 Vicryl was placed. The Ewald tube was removed. With the outlet of the gastrojejunostomy clamped and under saline irrigation the assistant performed upper endoscopy and with the gastric pouch tensely distended with air-there was no evidence of leak on this test. The pouch was desufflated. The Vonita Moss defect was closed with running 2-0 silk. The abdomen was inspected for any evidence of bleeding or bowel injury and everything looked fine. The Nathanson retractor was removed under direct vision after coating the anastomosis with Vistaseal tissue sealant. All CO2 was evacuated and trochars removed. Skin incisions were closed with 4-0 monocryl in a subcuticular fashion followed by  steri-strips and bandages. Sponge needle and instrument counts were correct. The patient was taken to the PACU in good condition.    Mary Sella. Andrey Campanile, MD, FACS General, Bariatric, & Minimally Invasive Surgery [facility], [place]

## 2022-07-07 NOTE — Anesthesia Procedure Notes (Signed)
Procedure Name: Intubation Date/Time: 07/07/2022 7:40 AM  Performed by: Claudia Desanctis, CRNAPre-anesthesia Checklist: Patient identified, Emergency Drugs available, Suction available and Patient being monitored Patient Re-evaluated:Patient Re-evaluated prior to induction Oxygen Delivery Method: Circle system utilized Preoxygenation: Pre-oxygenation with 100% oxygen Induction Type: IV induction Ventilation: Mask ventilation without difficulty Laryngoscope Size: Miller and 3 Grade View: Grade I Tube type: Oral Tube size: 7.0 mm Number of attempts: 1 Airway Equipment and Method: Stylet Placement Confirmation: ETT inserted through vocal cords under direct vision, positive ETCO2 and breath sounds checked- equal and bilateral Secured at: 22 cm Tube secured with: Tape Dental Injury: Teeth and Oropharynx as per pre-operative assessment

## 2022-07-07 NOTE — Progress Notes (Signed)

## 2022-07-07 NOTE — Interval H&P Note (Signed)
History and Physical Interval Note:  07/07/2022 7:26 AM  Sheri Malone  has presented today for surgery, with the diagnosis of MORBID OBESITY.  The various methods of treatment have been discussed with the patient and family. After consideration of risks, benefits and other options for treatment, the patient has consented to  Procedure(s): LAPAROSCOPIC ROUX-EN-Y GASTRIC BYPASS WITH UPPER ENDOSCOPY (N/A) HERNIA REPAIR HIATAL (N/A) as a surgical intervention.  The patient's history has been reviewed, patient examined, no change in status, stable for surgery.  I have reviewed the patient's chart and labs.  Questions were answered to the patient's satisfaction.    Leighton Ruff. Redmond Pulling, MD, [place], Bariatric, & Minimally Invasive Surgery [facility],  [place]  [place]

## 2022-07-07 NOTE — Op Note (Signed)
Preoperative diagnosis: Roux-en-Y gastric bypass  Postoperative diagnosis: Same   Procedure: Upper endoscopy   Surgeon: Gurney Maxin, M.D.  Anesthesia: Gen.   Indications for procedure: This patient was undergoing a Roux-en-Y gastric bypass.   Description of procedure: The endoscopy was placed in the mouth and into the oropharynx and under endoscopic vision it was advanced to the esophagogastric junction.  The stomach was insufflated and no bleeding or bubbles were seen.  The GEJ was identified at 39cm from the teeth. The anastomosis was widely patent at 46 cm from the teeth. No bleeding or leaks were detected. The scope was withdrawn without difficulty.    Gurney Maxin, M.D. General, Bariatric, & Minimally Invasive Surgery [facility] [place]

## 2022-07-07 NOTE — Discharge Instructions (Signed)

## 2022-07-07 NOTE — Progress Notes (Signed)
PHARMACY CONSULT FOR:  Risk Assessment for Post-Discharge VTE Following Bariatric Surgery  Post-Discharge VTE Risk Assessment: This patient's probability of 30-day post-discharge VTE is increased due to the factors marked:  Sleeve gastrectomy   Liver disorder (transplant, cirrhosis, or nonalcoholic steatohepatitis)   Hx of VTE   Hemorrhage requiring transfusion   GI perforation, leak, or obstruction   ====================================================    Female    Age >/=60 years    BMI >/=50 kg/m2    CHF    Dyspnea at Rest    Paraplegia   x Non-gastric-band surgery    Operation Time >/=3 hr    Return to OR     Length of Stay >/= 3 d   Hypercoagulable condition   Significant venous stasis      Predicted probability of 30-day post-discharge VTE: 0.16%  Other patient-specific factors to consider: n/a   Recommendation for Discharge: No pharmacologic prophylaxis post-discharge   Sheri Malone is a 36 y.o. female who underwent  laparoscopic Roux-en-Y gastric bypass on 07/07/22   Case start: 0803  Case end: 1002    Allergies  Allergen Reactions   Dust Mite Mixed Allergen Ext [Mite (D. Farinae)] Anaphylaxis   Other Anaphylaxis and Other (See Comments)    DUST ROACH   Shellfish Allergy     Patient Measurements: Height: 5' 2.5" (158.8 cm) Weight: 101.6 kg (224 lb) IBW/kg (Calculated) : 51.25 Body mass index is 40.32 kg/m.  Recent Labs    07/07/22 1110  WBC 14.7*  HGB 11.5*  HCT 37.7  PLT 363  CREATININE 0.98   Estimated Creatinine Clearance: 87.7 mL/min (by C-G formula based on SCr of 0.98 mg/dL).    Past Medical History:  Diagnosis Date   Abnormal Pap smear    Arthritis    ankle   Asthma    GERD (gastroesophageal reflux disease)    Gestational HTN    Headache(784.0)    History of ectopic pregnancy    History of gestational diabetes    Low hemoglobin    on ironspan   Pneumonia    Rhinitis    Vaginal Pap smear, abnormal      Medications  Prior to Admission  Medication Sig Dispense Refill Last Dose   azelastine (OPTIVAR) 0.05 % ophthalmic solution Place 1 drop into both eyes daily as needed (Allergies).   Past Week   EPINEPHrine 0.3 mg/0.3 mL IJ SOAJ injection Inject 0.3 mg into the muscle as needed for anaphylaxis.   never   fluticasone (FLONASE) 50 MCG/ACT nasal spray Place 1 spray into the nose daily as needed for allergies.   Past Month   levocetirizine (XYZAL) 5 MG tablet Take 5 mg by mouth every evening.   07/06/2022   montelukast (SINGULAIR) 10 MG tablet Take 10 mg by mouth at bedtime.   07/06/2022   Norethindrone Acetate-Ethinyl Estrad-FE (JUNEL FE 24) 1-20 MG-MCG(24) tablet Take 1 tablet by mouth daily.   07/06/2022   pantoprazole (PROTONIX) 40 MG tablet TAKE 1 TABLET (40 MG TOTAL) BY MOUTH IN THE MORNING 90 tablet 2 07/07/2022 at 0415   predniSONE (STERAPRED UNI-PAK 21 TAB) 10 MG (21) TBPK tablet Take by mouth daily. Take 6 tabs by mouth daily  for 2 days, then 5 tabs for 2 days, then 4 tabs for 2 days, then 3 tabs for 2 days, 2 tabs for 2 days, then 1 tab by mouth daily for 2 days 42 tablet 0 completed   SUMAtriptan (IMITREX) 100 MG tablet Take 1 tablet (100  mg total) by mouth once as needed for up to 2 doses. May repeat in 2 hours if headache persists or recurs. 10 tablet 12 07/06/2022   SYMBICORT 160-4.5 MCG/ACT inhaler INHALE 2 PUFFS INTO THE LUNGS IN THE MORNING AND AT BEDTIME. 10.2 each 1 07/07/2022 at 0415   albuterol (PROVENTIL HFA;VENTOLIN HFA) 108 (90 Base) MCG/ACT inhaler Inhale 1 puff into the lungs every 6 (six) hours as needed for wheezing or shortness of breath.   More than a month   azelastine (ASTELIN) 0.1 % nasal spray Place 1 spray into both nostrils daily.   More than a month   budesonide (PULMICORT) 0.5 MG/2ML nebulizer solution Take 2 mLs (0.5 mg total) by nebulization in the morning and at bedtime for 14 days. (Patient taking differently: Take 0.5 mg by nebulization daily as needed (Shortness of breath).) 56  mL 0 More than a month   ipratropium-albuterol (DUONEB) 0.5-2.5 (3) MG/3ML SOLN Take 3 mLs by nebulization every 6 (six) hours as needed (for wheeze, coughing spells, chest tightness, SOB). 180 mL 1 More than a month   meclizine (ANTIVERT) 25 MG tablet Take 12.5-25 mg by mouth 3 (three) times daily as needed for dizziness.   More than a month   ondansetron (ZOFRAN) 4 MG tablet Take 1 tablet (4 mg total) by mouth every 8 (eight) hours as needed for nausea or vomiting. 20 tablet 0 More than a month       Berkley Harvey 07/07/2022,12:34 PM

## 2022-07-07 NOTE — Transfer of Care (Signed)
Immediate Anesthesia Transfer of Care Note  Patient: Sheri Malone  Procedure(s) Performed: LAPAROSCOPIC ROUX-EN-Y GASTRIC BYPASS WITH UPPER ENDOSCOPY HERNIA REPAIR HIATAL  Patient Location: PACU  Anesthesia Type:General  Level of Consciousness: awake and patient cooperative  Airway & Oxygen Therapy: Patient Spontanous Breathing and Patient connected to face mask  Post-op Assessment: Report given to RN and Post -op Vital signs reviewed and stable  Post vital signs: Reviewed and stable  Last Vitals:  Vitals Value Taken Time  BP 141/82 07/07/22 1019  Temp    Pulse 105 07/07/22 1022  Resp 17 07/07/22 1022  SpO2 100 % 07/07/22 1022  Vitals shown include unvalidated device data.  Last Pain:  Vitals:   07/07/22 0550  TempSrc: Oral  PainSc:          Complications: No notable events documented.

## 2022-07-07 NOTE — Anesthesia Postprocedure Evaluation (Signed)
Anesthesia Post Note  Patient: [name]  Procedure(s) Performed: LAPAROSCOPIC ROUX-EN-Y GASTRIC BYPASS WITH UPPER ENDOSCOPY HERNIA REPAIR HIATAL     Anesthesia Type: General Level of consciousness: awake and alert Pain management: pain level controlled Vital Signs Assessment: post-procedure vital signs reviewed and stable Respiratory status: spontaneous breathing, nonlabored ventilation and respiratory function stable Cardiovascular status: blood pressure returned to baseline and stable Postop Assessment: no apparent nausea or vomiting Anesthetic complications: no Comments: Called to see patient on floor because of globus sensation and swollen uvula with associated gagging. No respiratory distress. Uvula did appear swollen which could be related to a variety of factors including ETT/Ewall tube/EGD which I explained to her. I advised ice chips/popsicles and possibly NSAIDs if approved by surgeon. I would expect swelling to resolve on its own with time.    No notable events documented.  Last Vitals:  Vitals:   07/07/22 1306 07/07/22 1337  BP:  (!) 141/85  Pulse: (!) 119 (!) 104  Resp:  18  Temp:  36.4 C  SpO2: 100% 99%    Last Pain:  Vitals:   07/07/22 1355  TempSrc:   PainSc: 7                  Teeghan Hammer E Elan Mcelvain

## 2022-07-07 NOTE — Progress Notes (Signed)
Pt admitted to [facility] and was placed in chair. A few minutes later after speaking to pt, pt began to hack and have bloody mucous/saliva output.  Suction set up and oral suctioned. Vital signs noted to have elevated HR.  Oxygen sats 100% on RA. Pt states " its hard to swallow and I feel like I need to spit up some more stuff". Oral assessment noted to have swollen uvula and is sitting on top of pts tongue.  Charge RN notified and aware. Crystal RN aware and rapid called by Agricultural consultant. MD CRNA notified and came to assess pt.  @1308  - reassessed pts throat and uvula noted to return to normal anatomical position. Pt states " it feels better but still feels like I have more mucous in there to come up. This RN provided reassurance and reassessed vitals. HR did come down but still mildly elevated.  Will continue to monitor.

## 2022-07-08 ENCOUNTER — Encounter (HOSPITAL_COMMUNITY): Payer: Self-pay | Admitting: General Surgery

## 2022-07-08 LAB — CBC WITH DIFFERENTIAL/PLATELET
Abs Immature Granulocytes: 0.06 10*3/uL (ref 0.00–0.07)
Basophils Absolute: 0 10*3/uL (ref 0.0–0.1)
Basophils Relative: 0 %
Eosinophils Absolute: 0 10*3/uL (ref 0.0–0.5)
Eosinophils Relative: 0 %
HCT: 35.9 % — ABNORMAL LOW (ref 36.0–46.0)
Hemoglobin: 11.3 g/dL — ABNORMAL LOW (ref 12.0–15.0)
Immature Granulocytes: 0 %
Lymphocytes Relative: 11 %
Lymphs Abs: 1.5 10*3/uL (ref 0.7–4.0)
MCH: 25.6 pg — ABNORMAL LOW (ref 26.0–34.0)
MCHC: 31.5 g/dL (ref 30.0–36.0)
MCV: 81.2 fL (ref 80.0–100.0)
Monocytes Absolute: 0.9 10*3/uL (ref 0.1–1.0)
Monocytes Relative: 7 %
Neutro Abs: 11.5 10*3/uL — ABNORMAL HIGH (ref 1.7–7.7)
Neutrophils Relative %: 82 %
Platelets: 332 10*3/uL (ref 150–400)
RBC: 4.42 MIL/uL (ref 3.87–5.11)
RDW: 14.3 % (ref 11.5–15.5)
WBC: 13.9 10*3/uL — ABNORMAL HIGH (ref 4.0–10.5)
nRBC: 0 % (ref 0.0–0.2)

## 2022-07-08 LAB — COMPREHENSIVE METABOLIC PANEL
ALT: 39 U/L (ref 0–44)
AST: 45 U/L — ABNORMAL HIGH (ref 15–41)
Albumin: 3.3 g/dL — ABNORMAL LOW (ref 3.5–5.0)
Alkaline Phosphatase: 23 U/L — ABNORMAL LOW (ref 38–126)
Anion gap: 8 (ref 5–15)
BUN: 6 mg/dL (ref 6–20)
CO2: 24 mmol/L (ref 22–32)
Calcium: 9 mg/dL (ref 8.9–10.3)
Chloride: 106 mmol/L (ref 98–111)
Creatinine, Ser: 0.81 mg/dL (ref 0.44–1.00)
GFR, Estimated: >60 mL/min (ref >60)
Glucose, Bld: 154 mg/dL — ABNORMAL HIGH (ref 70–99)
Potassium: 4.6 mmol/L (ref 3.5–5.1)
Sodium: 138 mmol/L (ref 135–145)
Total Bilirubin: 0.9 mg/dL (ref 0.3–1.2)
Total Protein: 6.5 g/dL (ref 6.5–8.1)

## 2022-07-08 MED ORDER — ACETAMINOPHEN 500 MG PO TABS: 1000.0000 mg | ORAL_TABLET | Freq: Three times a day (TID) | ORAL | 0 refills | Status: AC

## 2022-07-08 MED ORDER — ONDANSETRON HCL 4 MG PO TABS: 4.0000 mg | ORAL_TABLET | Freq: Three times a day (TID) | ORAL | 0 refills | Status: AC | PRN

## 2022-07-08 MED ORDER — OXYCODONE HCL 5 MG PO TABS: 5.0000 mg | ORAL_TABLET | Freq: Four times a day (QID) | ORAL | 0 refills | Status: DC | PRN

## 2022-07-08 MED ORDER — KETOROLAC TROMETHAMINE 15 MG/ML IJ SOLN
15.0000 mg | Freq: Once | INTRAMUSCULAR | Status: AC
  Administered 2022-07-08: 15 mg via INTRAVENOUS
  Filled 2022-07-08: qty 1

## 2022-07-08 NOTE — Discharge Summary (Signed)
Physician Discharge Summary  BRACHA FRANKOWSKI ASN:[id number] DOB: October 20, 1986 DOA: 07/07/2022  PCP: Danelle Berry, PA-C  Admit date: 07/07/2022 Discharge date: 07/08/2022  Recommendations for Outpatient Follow-up:     Follow-up Information     Gaynelle Adu, MD. Go on 07/30/2022.   Specialty: General Surgery Why: at 9:30am. Please arrive 15 minutes prior to your appointment time.  Thank you. Contact information: [address] [place] [address] [phone]         Maczis, Hedda Slade, [place]. Go on 08/28/2022.   Specialty: General Surgery Why: at 9am for Dr. Andrey Campanile.  Please arrive 15 minutes prior to your appointment time.  Thank you. Contact information: [address] [place] [address] [phone]                Discharge Diagnoses:  Principal Problem:   S/P gastric bypass GERD without esophagitis  Sliding hiatal hernia Low HDL  Surgical Procedure: Laparoscopic Roux-en-Y gastric bypass with sliding hiatal hernia repair, upper endoscopy  Discharge Condition: Good Disposition: Home  Diet recommendation: Postoperative gastric bypass diet  Filed Weights   07/07/22 0530  Weight: 101.6 kg     Hospital Course:  The patient was admitted for a planned laparoscopic Roux-en-Y gastric bypass.  He was found to have a small sliding hiatal hernia which was also repaired.  Please see operative note. Preoperatively the patient was given 5000 units of subcutaneous heparin for DVT prophylaxis. ERAS protocol was used. Postoperative prophylactic heparin dosing was started on the evening of postoperative day 0.  She did have some inflammation of her uvula immediately after surgery.  The patient was started on ice chips and water on the evening of POD 0 which they tolerated. On postoperative day 1 The patient's diet was advanced to protein shakes which they also tolerated. On POD 1, The patient was ambulating without difficulty. Their vital signs are stable  without fever or tachycardia. Their hemoglobin had remained stable. The patient had received discharge instructions and counseling. They were deemed stable for discharge.  BP (!) 161/102 (BP Location: Left Arm)   Pulse 87   Temp 98.2 F (36.8 C) (Oral)   Resp 18   Ht 5' 2.5" (1.588 m)   Wt 101.6 kg   LMP 06/13/2022 (Approximate) Comment: negative urine preg.07/07/22  SpO2 100%   BMI 40.32 kg/m   Gen: alert, NAD, non-toxic appearing Pupils: equal, no scleral icterus Pulm: Lungs clear to auscultation, symmetric chest rise CV: regular rate and rhythm Abd: soft, min tender, nondistended. No cellulitis. No incisional hernia Ext: no edema, no calf tenderness Skin: no rash, no jaundice  Discharge Instructions  Discharge Instructions     Ambulate hourly while awake   Complete by: As directed    Call MD for:  difficulty breathing, headache or visual disturbances   Complete by: As directed    Call MD for:  persistant dizziness or light-headedness   Complete by: As directed    Call MD for:  persistant nausea and vomiting   Complete by: As directed    Call MD for:  redness, tenderness, or signs of infection (pain, swelling, redness, odor or green/yellow discharge around incision site)   Complete by: As directed    Call MD for:  severe uncontrolled pain   Complete by: As directed    Call MD for:  temperature >101 F   Complete by: As directed    Diet bariatric full liquid   Complete by: As directed  Discharge instructions   Complete by: As directed    See bariatric discharge instructions   Incentive spirometry   Complete by: As directed    Perform hourly while awake      Allergies as of 07/08/2022       Reactions   Dust Mite Mixed Allergen Ext [mite (d. Farinae)] Anaphylaxis   Other Anaphylaxis, Other (See Comments)   DUST ROACH   Shellfish Allergy         Medication List     STOP taking these medications    predniSONE 10 MG (21) Tbpk tablet Commonly known as:  STERAPRED UNI-PAK 21 TAB       TAKE these medications    acetaminophen 500 MG tablet Commonly known as: TYLENOL Take 2 tablets (1,000 mg total) by mouth every 8 (eight) hours for 5 days.   albuterol 108 (90 Base) MCG/ACT inhaler Commonly known as: VENTOLIN HFA Inhale 1 puff into the lungs every 6 (six) hours as needed for wheezing or shortness of breath.   azelastine 0.05 % ophthalmic solution Commonly known as: OPTIVAR Place 1 drop into both eyes daily as needed (Allergies).   azelastine 0.1 % nasal spray Commonly known as: ASTELIN Place 1 spray into both nostrils daily.   budesonide 0.5 MG/2ML nebulizer solution Commonly known as: PULMICORT Take 2 mLs (0.5 mg total) by nebulization in the morning and at bedtime for 14 days. What changed:  when to take this reasons to take this   EPINEPHrine 0.3 mg/0.3 mL Soaj injection Commonly known as: EPI-PEN Inject 0.3 mg into the muscle as needed for anaphylaxis.   fluticasone 50 MCG/ACT nasal spray Commonly known as: FLONASE Place 1 spray into the nose daily as needed for allergies.   ipratropium-albuterol 0.5-2.5 (3) MG/3ML Soln Commonly known as: DUONEB Take 3 mLs by nebulization every 6 (six) hours as needed (for wheeze, coughing spells, chest tightness, SOB).   Junel Fe 24 1-20 MG-MCG(24) tablet Generic drug: Norethindrone Acetate-Ethinyl Estrad-FE Take 1 tablet by mouth daily. Notes to patient: Medication may not be effective for the next 30 days.  Use precaution if necessary.    levocetirizine 5 MG tablet Commonly known as: XYZAL Take 5 mg by mouth every evening.   meclizine 25 MG tablet Commonly known as: ANTIVERT Take 12.5-25 mg by mouth 3 (three) times daily as needed for dizziness.   montelukast 10 MG tablet Commonly known as: SINGULAIR Take 10 mg by mouth at bedtime.   ondansetron 4 MG tablet Commonly known as: Zofran Take 1 tablet (4 mg total) by mouth every 8 (eight) hours as needed for nausea or  vomiting.   oxyCODONE 5 MG immediate release tablet Commonly known as: Oxy IR/ROXICODONE Take 1 tablet (5 mg total) by mouth every 6 (six) hours as needed for severe pain.   pantoprazole 40 MG tablet Commonly known as: PROTONIX TAKE 1 TABLET (40 MG TOTAL) BY MOUTH IN THE MORNING   SUMAtriptan 100 MG tablet Commonly known as: IMITREX Take 1 tablet (100 mg total) by mouth once as needed for up to 2 doses. May repeat in 2 hours if headache persists or recurs.   Symbicort 160-4.5 MCG/ACT inhaler Generic drug: budesonide-formoterol INHALE 2 PUFFS INTO THE LUNGS IN THE MORNING AND AT BEDTIME.        Follow-up Information     Gaynelle Adu, MD. Go on 07/30/2022.   Specialty: General Surgery Why: at 9:30am. Please arrive 15 minutes prior to your appointment time.  Thank you. Contact information: [address] [place] [address] [phone]         Maczis, Carlena Hurl, [place]. Go on 08/28/2022.   Specialty: General Surgery Why: at 9am for Dr. Redmond Pulling.  Please arrive 15 minutes prior to your appointment time.  Thank you. Contact information: [address] [place] [address] [phone]                  The results of significant diagnostics from this hospitalization (including imaging, microbiology, ancillary and laboratory) are listed below for reference.    Significant Diagnostic Studies: No results found.  Labs: Basic Metabolic Panel: Recent Labs  Lab 07/07/22 1110 07/08/22 0436  NA  --  138  K  --  4.6  CL  --  106  CO2  --  24  GLUCOSE  --  154*  BUN  --  6  CREATININE 0.98 0.81  CALCIUM  --  9.0   Liver Function Tests: Recent Labs  Lab 07/08/22 0436  AST 45*  ALT 39  ALKPHOS 23*  BILITOT 0.9  PROT 6.5  ALBUMIN 3.3*    CBC: Recent Labs  Lab 07/07/22 1110 07/07/22 1407 07/08/22 0436  WBC 14.7*  --  13.9*  NEUTROABS  --   --  11.5*  HGB 11.5* 11.8* 11.3*  HCT 37.7 37.3 35.9*  MCV 83.2  --  81.2  PLT 363   --  332    CBG: No results for input(s): "GLUCAP" in the last 168 hours.  Principal Problem:   S/P gastric bypass   Time coordinating discharge: 15 min  Signed:  Gayland Curry, MD [facility], [place] [phone] 07/08/2022, 3:46 PM

## 2022-07-08 NOTE — Progress Notes (Signed)
Mobility Specialist - Progress Note   07/08/22 0921  Mobility  HOB Elevated/Bed Position Self regulated  Activity Ambulated independently in hallway  Range of Motion/Exercises Active  Level of Assistance Independent after set-up  Assistive Device None  Distance Ambulated (ft) 480 ft  Activity Response Tolerated well  $Mobility charge 1 Mobility   Pt was found in bed and agreeable to mobilize. Pt had pain from abdomin but stated that it gets better while ambulating. At EOS returned to recliner chair with all necessities in reach.  Ferd Hibbs Mobility Specialist

## 2022-07-08 NOTE — Progress Notes (Signed)
Discharge instructions given to patient and all questions were answered.  

## 2022-07-08 NOTE — Progress Notes (Signed)
Patient alert and oriented, pain is controlled. Patient is tolerating fluids, advanced to protein shake today, patient is tolerating well. Reviewed Gastric Bypass discharge instructions with patient and patient is able to articulate understanding. Provided information on BELT program, Support Group and [facility] outpatient pharmacy. All questions answered, will continue to monitor.  24hr fluid recall prior to discharge: 615mL.  Per dehydration protocol, will call pt to f/u wtihin one week post op.

## 2022-07-10 ENCOUNTER — Telehealth (HOSPITAL_COMMUNITY): Payer: Self-pay | Admitting: *Deleted

## 2022-07-10 NOTE — Telephone Encounter (Signed)
1.  Tell me about your pain and pain management? Pt denies any pain. States that she only took the pain medication the night of discharge and that Tylenol has been working.  2.  Let's talk about fluid intake.  How much total fluid are you taking in? Pt states that she is getting in at least 60oz of fluid including protein shakes/protein powder, bottled water, Gatorade and broth.  Pt instructed to assess status and suggestions daily utilizing Hydration Action Plan on discharge folder and to call [facility] if in the "red zone".   3.  How much protein have you taken in the last 2 days? Pt states she is close to meeting her goal of 60g of protein each day with the protein shakes and protein powders. She consumed 56g of protein yesterday.  4.  Have you had nausea?  Tell me about when have experienced nausea and what you did to help? Pt denies nausea.   5.  Has the frequency or color changed with your urine? Pt states that she is urinating "not as much as in the hospital, but enough" with no changes in frequency or urgency.     6.  Tell me what your incisions look like? "Incisions look fine". Pt denies a fever, chills.  Pt states incisions are not swollen, open, or draining.  Pt encouraged to call [facility] if incisions change.   7.  Have you been passing gas? BM? Pt states that she is having BMs. Last BM 07/10/22.     8.  If a problem or question were to arise who would you call?  Do you know contact numbers for [facility], [facility], and [facility]? Pt denies dehydration symptoms.  Pt can describe s/sx of dehydration.  Pt knows to call [facility] for surgical, [facility] for nutrition, and [facility] for non-urgent questions or concerns.   9.  How has the walking going? Pt states she is walking around and able to be active without difficulty.   10. Are you still using your incentive spirometer?  If so, how often? Pt states that she is doing the I.S.   Pt encouraged to use incentive spirometer, at least 10x every hour while awake until she sees  the surgeon.  11.  How are your vitamins and calcium going?  How are you taking them? Pt states that she is taking her supplements and vitamins without difficulty. Reinforced education about taking supplements at least two hours apart.    Reminded patient that the first 30 days post-operatively are important for successful recovery.  Practice good hand hygiene, wearing a mask when appropriate (since optional in most places), and minimizing exposure to people who live outside of the home, especially if they are exhibiting any respiratory, GI, or illness-like symptoms.

## 2022-07-22 ENCOUNTER — Encounter: Payer: 59 | Attending: Family Medicine | Admitting: Dietician

## 2022-07-22 ENCOUNTER — Encounter: Payer: Self-pay | Admitting: Dietician

## 2022-07-22 DIAGNOSIS — E669 Obesity, unspecified: Secondary | ICD-10-CM

## 2022-07-22 DIAGNOSIS — Z713 Dietary counseling and surveillance: Secondary | ICD-10-CM | POA: Diagnosis not present

## 2022-07-22 DIAGNOSIS — Z6837 Body mass index (BMI) 37.0-37.9, adult: Secondary | ICD-10-CM | POA: Insufficient documentation

## 2022-07-22 NOTE — Progress Notes (Signed)
2 Week Post-Operative Nutrition Class   Patient was seen on 07/22/2022 for Post-Operative Nutrition education at the [facility].    Surgery date: 07/07/2022 Surgery type: RYGB  NUTRITION ASSESSMENT   Anthropometrics  Start weight at [facility]: 226 lbs (date: 11/20/2021)  Height: 62 in Weight today: 205.5 lbs BMI: 37.59 kg/m2     Clinical  Medical hx: vertigo, nausea, GERD Medications: see list  Labs: alkaline phosphatase 33, LDL 103, A1C 6.0 Notable signs/symptoms: monthly migraines  Any previous deficiencies? No Bowel Habits: Every day to every other day no complaints   Body Composition Scale 07/22/2022  Current Body Weight 205.5  Total Body Fat % 40.9  Visceral Fat 12  Fat-Free Mass % 59.0   Total Body Water % 44.0  Muscle-Mass lbs 30.5  BMI 37.4  Body Fat Displacement          Torso  lbs 52.0         Left Leg  lbs 10.4         Right Leg  lbs 10.4         Left Arm  lbs 5.2         Right Arm   lbs 5.2      The following the learning objectives were met by the patient during this course: Identifies Phase 3 (Soft, High Proteins) Dietary Goals and will begin from 2 weeks post-operatively to 2 months post-operatively Identifies appropriate sources of fluids and proteins  Identifies appropriate fat sources and healthy verses unhealthy fat types   States protein recommendations and appropriate sources post-operatively Identifies the need for appropriate texture modifications, mastication, and bite sizes when consuming solids Identifies appropriate fat consumption and sources Identifies appropriate multivitamin and calcium sources post-operatively Describes the need for physical activity post-operatively and will follow MD recommendations States when to call healthcare provider regarding medication questions or post-operative complications   Handouts given during class include: Phase 3A: Soft, High Protein Diet Handout Phase 3 High Protein  Meals Healthy Fats   Follow-Up Plan: Patient will follow-up at [facility] in 6 weeks for 2 month post-op nutrition visit for diet advancement per MD.

## 2022-07-28 ENCOUNTER — Telehealth: Payer: Self-pay | Admitting: Dietician

## 2022-07-28 NOTE — Telephone Encounter (Signed)
RD called pt to verify fluid intake once starting soft, solid proteins 2 week post-bariatric surgery.   Daily Fluid intake: struggle (50 oz) Daily Protein intake: 60 grams Bowel Habits:  a little constipated right now, recommended pushing fluids, and use Milk of Magnesia as tolerated.  Pt states it was giving her diarrhea  Concerns/issues: tolerating protein foods

## 2022-07-31 ENCOUNTER — Encounter (INDEPENDENT_AMBULATORY_CARE_PROVIDER_SITE_OTHER): Payer: 59

## 2022-07-31 ENCOUNTER — Ambulatory Visit (INDEPENDENT_AMBULATORY_CARE_PROVIDER_SITE_OTHER): Payer: 59 | Admitting: Vascular Surgery

## 2022-08-18 ENCOUNTER — Encounter (INDEPENDENT_AMBULATORY_CARE_PROVIDER_SITE_OTHER): Payer: Self-pay

## 2022-08-26 ENCOUNTER — Telehealth: Payer: 59 | Admitting: Physician Assistant

## 2022-08-26 DIAGNOSIS — J208 Acute bronchitis due to other specified organisms: Secondary | ICD-10-CM

## 2022-08-26 MED ORDER — PREDNISONE 20 MG PO TABS: 40.0000 mg | ORAL_TABLET | Freq: Every day | ORAL | 0 refills | Status: DC

## 2022-08-26 MED ORDER — BENZONATATE 100 MG PO CAPS: 100.0000 mg | ORAL_CAPSULE | Freq: Three times a day (TID) | ORAL | 0 refills | Status: DC | PRN

## 2022-08-26 NOTE — Progress Notes (Signed)
We are sorry that you are not feeling well.  Here is how we plan to help!  Based on your presentation I believe you most likely have A cough due to a virus.  This is called viral bronchitis and is best treated by rest, plenty of fluids and control of the cough.  You may use Ibuprofen or Tylenol as directed to help your symptoms.     In addition you may use A prescription cough medication called Tessalon Perles 100mg . You may take 1-2 capsules every 8 hours as needed for your cough. I have also prescribed a short burst of prednisone as well to calm cough, relax airways and reduce inflammation.   From your responses in the eVisit questionnaire you describe inflammation in the upper respiratory tract which is causing a significant cough.  This is commonly called Bronchitis and has four common causes:   Allergies Viral Infections Acid Reflux Bacterial Infection Allergies, viruses and acid reflux are treated by controlling symptoms or eliminating the cause. An example might be a cough caused by taking certain blood pressure medications. You stop the cough by changing the medication. Another example might be a cough caused by acid reflux. Controlling the reflux helps control the cough.  USE OF BRONCHODILATOR ("RESCUE") INHALERS: There is a risk from using your bronchodilator too frequently.  The risk is that over-reliance on a medication which only relaxes the muscles surrounding the breathing tubes can reduce the effectiveness of medications prescribed to reduce swelling and congestion of the tubes themselves.  Although you feel brief relief from the bronchodilator inhaler, your asthma may actually be worsening with the tubes becoming more swollen and filled with mucus.  This can delay other crucial treatments, such as oral steroid medications. If you need to use a bronchodilator inhaler daily, several times per day, you should discuss this with your provider.  There are probably better treatments that  could be used to keep your asthma under control.     HOME CARE Only take medications as instructed by your medical team. Complete the entire course of an antibiotic. Drink plenty of fluids and get plenty of rest. Avoid close contacts especially the very young and the elderly Cover your mouth if you cough or cough into your sleeve. Always remember to wash your hands A steam or ultrasonic humidifier can help congestion.   GET HELP RIGHT AWAY IF: You develop worsening fever. You become short of breath You cough up blood. Your symptoms persist after you have completed your treatment plan MAKE SURE YOU  Understand these instructions. Will watch your condition. Will get help right away if you are not doing well or get worse.    Thank you for choosing an e-visit.  Your e-visit answers were reviewed by a board certified advanced clinical practitioner to complete your personal care plan. Depending upon the condition, your plan could have included both over the counter or prescription medications.  Please review your pharmacy choice. Make sure the pharmacy is open so you can pick up prescription now. If there is a problem, you may contact your provider through [employer] and have the prescription routed to another pharmacy.  Your safety is important to [name]. If you have drug allergies check your prescription carefully.   For the next 24 hours you can use MyChart to ask questions about today's visit, request a non-urgent call back, or ask for a work or school excuse. You will get an email in the next two days asking about your  experience. I hope that your e-visit has been valuable and will speed your recovery.

## 2022-09-02 ENCOUNTER — Encounter: Payer: Self-pay | Admitting: Skilled Nursing Facility1

## 2022-09-02 ENCOUNTER — Encounter: Payer: 59 | Attending: General Surgery | Admitting: Skilled Nursing Facility1

## 2022-09-02 VITALS — Ht 62.0 in | Wt 189.1 lb

## 2022-09-02 DIAGNOSIS — E669 Obesity, unspecified: Secondary | ICD-10-CM | POA: Diagnosis not present

## 2022-09-02 NOTE — Progress Notes (Signed)
Bariatric Nutrition Follow-Up Visit Medical Nutrition Therapy   Surgery date: 07/07/2022 Surgery type: RYGB  NUTRITION ASSESSMENT   Anthropometrics  Start weight at [facility]: 226 lbs (date: 11/20/2021)  Height: 62 in Weight today: 189.1 pounds    Clinical  Medical hx: vertigo, nausea, GERD Medications: see list  Labs: alkaline phosphatase 33, LDL 103, A1C 6.0 Notable signs/symptoms: monthly migraines  Any previous deficiencies? No   Body Composition Scale 07/22/2022 09/02/2022  Current Body Weight 205.5 189.1  Total Body Fat % 40.9 38.4  Visceral Fat 12 10  Fat-Free Mass % 59.0 61.5   Total Body Water % 44.0 45.2  Muscle-Mass lbs 30.5 30.3  BMI 37.4 34.4  Body Fat Displacement           Torso  lbs 52.0 44.9         Left Leg  lbs 10.4 8.9         Right Leg  lbs 10.4 8.9         Left Arm  lbs 5.2 4.4         Right Arm   lbs 5.2 4.4     Lifestyle & Dietary Hx  Pt states chicken does not work.   Pt states this diet progression has been mentally tough so advancing to more food groups would be really helpful.   Estimated daily fluid intake: 57-64 oz Estimated daily protein intake: 70+ g Supplements: multi and calcium  Current average weekly physical activity: 15-30 minutes treadmill and 15-30 minutes light weights 5 days a week   24-Hr Dietary Recall First Meal 8:45-9:30am: protein shake or 2 boiled eggs or skipped Snack:  early lunch if no breakfast and then yogurt and nuts Second Meal: salmi and cheese + vinagerrette or moes steak and cheese Snack:  yogurt or protein shake Third Meal: shredded beef + sauce  Snack: maybe nuts or half a protein shake Beverages: coffee, water, gatorade zero  Post-Op Goals/ Signs/ Symptoms Using straws: no Drinking while eating: no Chewing/swallowing difficulties: no Changes in vision: no Changes to mood/headaches: no Hair loss/changes to skin/nails: no Difficulty focusing/concentrating: no Sweating: no Limb weakness:  on Dizziness/lightheadedness: no Palpitations: no  Carbonated/caffeinated beverages: no N/V/D/C/Gas: no Abdominal pain: no Dumping syndrome: no    NUTRITION DIAGNOSIS  Overweight/obesity (-3.3) related to past poor dietary habits and physical inactivity as evidenced by completed bariatric surgery and following dietary guidelines for continued weight loss and healthy nutrition status.     NUTRITION INTERVENTION Nutrition counseling (C-1) and education (E-2) to facilitate bariatric surgery goals, including: The importance of consuming adequate calories as well as certain nutrients daily due to the body's need for essential vitamins, minerals, and fats The importance of daily physical activity and to reach a goal of at least 150 minutes of moderate to vigorous physical activity weekly (or as directed by their physician) due to benefits such as increased musculature and improved lab values The importance of intuitive eating specifically learning hunger-satiety cues and understanding the importance of learning a new body: The importance of mindful eating to avoid grazing behaviors  Encouraged patient to honor their body's internal hunger and fullness cues.  Throughout the day, check in mentally and rate hunger. Stop eating when satisfied not full regardless of how much food is left on the plate.  Get more if still hungry 20-30 minutes later.  The key is to honor satisfaction so throughout the meal, rate fullness factor and stop when comfortably satisfied not physically full. The key is to  honor hunger and fullness without any feelings of guilt or shame.  Pay attention to what the internal cues are, rather than any external factors. This will enhance the confidence you have in listening to your own body and following those internal cues enabling you to increase how often you eat when you are hungry not out of appetite and stop when you are satisfied not full.  Encouraged pt to continue to eat balanced  meals inclusive of non starchy vegetables 2 times a day 7 days a week Encouraged pt to choose lean protein sources: limiting beef, pork, sausage, hotdogs, and lunch meat Encourage pt to choose healthy fats such as plant based limiting animal fats Encouraged pt to continue to drink a minium 64 fluid ounces with half being plain water to satisfy proper hydration   Handouts Provided Include  Phase 4  Learning Style & Readiness for Change Teaching method utilized: Visual & Auditory  Demonstrated degree of understanding via: Teach Back  Readiness Level: contemplative Barriers to learning/adherence to lifestyle change: unidentified   RD's Notes for Next Visit Assess adherence to pt chosen goals    MONITORING & EVALUATION Dietary intake, weekly physical activity, body weight  Next Steps Patient is to follow-up in 3 months

## 2022-09-09 NOTE — Progress Notes (Signed)
I have spent 5 minutes in review of e-visit questionnaire, review and updating patient chart, medical decision making and response to patient.   Christino Mcglinchey Cody Wilmary Levit, PA-C    

## 2022-09-15 ENCOUNTER — Encounter: Payer: Self-pay | Admitting: Family Medicine

## 2022-09-19 ENCOUNTER — Ambulatory Visit: Payer: 59 | Admitting: Nurse Practitioner

## 2022-09-19 ENCOUNTER — Encounter: Payer: Self-pay | Admitting: Nurse Practitioner

## 2022-09-19 VITALS — BP 136/78 | HR 100 | Temp 99.7°F | Resp 16 | Ht 63.0 in | Wt 187.0 lb

## 2022-09-19 DIAGNOSIS — N926 Irregular menstruation, unspecified: Secondary | ICD-10-CM

## 2022-09-19 NOTE — Progress Notes (Signed)
BP 136/78   Pulse 100   Temp 99.7 F (37.6 C) (Oral)   Resp 16   Ht 5\' 3"  (1.6 m)   Wt 187 lb (84.8 kg)   SpO2 100%   BMI 33.13 kg/m    Subjective:    Patient ID: , female    DOB: 06-20-1986, 36 y.o.   MRN: [id number]  HPI: Sheri Malone is a 36 y.o. female  Chief Complaint  Patient presents with   Menstrual Problem    Pt had recent surgery for weight loss. She's been having irregular periods on spotting here and there. Pt concerned on having so much bleeding. 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  She reports that her menstrual cycle started on time was a little heavier than normal.  October cycle was normal, November : She says that her period it started early then lasted over a week and then she continued to spot.  She says she has had irregular periods in the past but was being controlled with birth control.  She says she may have gotten her birthcontrol packs mixed up. She is currently taking Junel FE 24. She says she consistently takes it.  She says she is not currently bleeding.Will get labs. She is upto date on her pap.  Discussed with patient to see how next months period is if it is abnormal to go to gyn. She verbalized understanding.   Relevant past medical, surgical, family and social history reviewed and updated as indicated. Interim medical history since our last visit reviewed. Allergies and medications reviewed and updated.  Review of Systems  Constitutional: Negative for fever or weight change.  Respiratory: Negative for cough and shortness of breath.   Cardiovascular: Negative for chest pain or palpitations.  Gastrointestinal: Negative for abdominal pain, no bowel changes.  Musculoskeletal: Negative for gait problem or joint swelling.  Skin: Negative for rash.  Neurological: Negative for  dizziness or headache.  No other specific complaints in a complete review of systems (except as listed in HPI above).      Objective:    BP 136/78   Pulse 100   Temp 99.7 F (37.6 C) (Oral)   Resp 16   Ht 5\' 3"  (1.6 m)   Wt 187 lb (84.8 kg)   SpO2 100%   BMI 33.13 kg/m   Wt Readings from Last 3 Encounters:  09/19/22 187 lb (84.8 kg)  09/02/22 189 lb 1.6 oz (85.8 kg)  07/22/22 205 lb 8 oz (93.2 kg)    Physical Exam  Constitutional: Patient appears well-developed and well-nourished. Obese No distress.  HEENT: head atraumatic, normocephalic, pupils equal and reactive to light, neck supple Cardiovascular: Normal rate, regular rhythm and normal heart sounds.  No murmur heard. No BLE edema. Pulmonary/Chest: Effort normal and breath sounds normal. No respiratory distress. Abdominal: Soft.  There is no tenderness. Psychiatric: Patient has a normal mood and affect. behavior is normal. Judgment and thought content normal.   Results for orders placed or performed during the hospital encounter of 07/07/22  CBC  Result Value Ref Range   WBC 14.7 (H) 4.0 - 10.5 K/uL   RBC 4.53 3.87 - 5.11 MIL/uL   Hemoglobin 11.5 (L) 12.0 - 15.0 g/dL   HCT 09/21/22 07/09/22 - 93.8 %   MCV 83.2 80.0 - 100.0 fL  MCH 25.4 (L) 26.0 - 34.0 pg   MCHC 30.5 30.0 - 36.0 g/dL   RDW 96.2 83.6 - 62.9 %   Platelets 363 150 - 400 K/uL   nRBC 0.0 0.0 - 0.2 %  Creatinine, serum  Result Value Ref Range   Creatinine, Ser 0.98 0.44 - 1.00 mg/dL   GFR, Estimated >47 >65 mL/min  Hemoglobin and hematocrit, blood  Result Value Ref Range   Hemoglobin 11.8 (L) 12.0 - 15.0 g/dL   HCT 46.5 03.5 - 46.5 %  CBC with Differential  Result Value Ref Range   WBC 13.9 (H) 4.0 - 10.5 K/uL   RBC 4.42 3.87 - 5.11 MIL/uL   Hemoglobin 11.3 (L) 12.0 - 15.0 g/dL   HCT 68.1 (L) 27.5 - 17.0 %   MCV 81.2 80.0 - 100.0 fL   MCH 25.6 (L) 26.0 - 34.0 pg   MCHC 31.5 30.0 - 36.0 g/dL   RDW 01.7 49.4 - 49.6 %   Platelets 332 150 - 400 K/uL    nRBC 0.0 0.0 - 0.2 %   Neutrophils Relative % 82 %   Neutro Abs 11.5 (H) 1.7 - 7.7 K/uL   Lymphocytes Relative 11 %   Lymphs Abs 1.5 0.7 - 4.0 K/uL   Monocytes Relative 7 %   Monocytes Absolute 0.9 0.1 - 1.0 K/uL   Eosinophils Relative 0 %   Eosinophils Absolute 0.0 0.0 - 0.5 K/uL   Basophils Relative 0 %   Basophils Absolute 0.0 0.0 - 0.1 K/uL   Immature Granulocytes 0 %   Abs Immature Granulocytes 0.06 0.00 - 0.07 K/uL  Comprehensive metabolic panel  Result Value Ref Range   Sodium 138 135 - 145 mmol/L   Potassium 4.6 3.5 - 5.1 mmol/L   Chloride 106 98 - 111 mmol/L   CO2 24 22 - 32 mmol/L   Glucose, Bld 154 (H) 70 - 99 mg/dL   BUN 6 6 - 20 mg/dL   Creatinine, Ser 7.59 0.44 - 1.00 mg/dL   Calcium 9.0 8.9 - 16.3 mg/dL   Total Protein 6.5 6.5 - 8.1 g/dL   Albumin 3.3 (L) 3.5 - 5.0 g/dL   AST 45 (H) 15 - 41 U/L   ALT 39 0 - 44 U/L   Alkaline Phosphatase 23 (L) 38 - 126 U/L   Total Bilirubin 0.9 0.3 - 1.2 mg/dL   GFR, Estimated >84 >66 mL/min   Anion gap 8 5 - 15  Pregnancy, urine POC  Result Value Ref Range   Preg Test, Ur NEGATIVE NEGATIVE  ABO/Rh  Result Value Ref Range   ABO/RH(D)      O POS Performed at [facility], [address] [place], [place] [address]       Assessment & Plan:   Problem List Items Addressed This Visit   None Visit Diagnoses     Irregular periods    -  Primary   getting labs, continue taking OCP.  if irregular period next month seek obgyn   Relevant Orders   CBC with Differential/Platelet   COMPLETE METABOLIC PANEL WITH GFR   TSH        Follow up plan: Return if symptoms worsen or fail to improve.

## 2022-09-20 LAB — COMPLETE METABOLIC PANEL WITH GFR
AG Ratio: 1.7 (calc) (ref 1.0–2.5)
ALT: 12 U/L (ref 6–29)
AST: 14 U/L (ref 10–30)
Albumin: 4.2 g/dL (ref 3.6–5.1)
Alkaline phosphatase (APISO): 31 U/L (ref 31–125)
BUN: 8 mg/dL (ref 7–25)
CO2: 25 mmol/L (ref 20–32)
Calcium: 9.4 mg/dL (ref 8.6–10.2)
Chloride: 103 mmol/L (ref 98–110)
Creat: 0.83 mg/dL (ref 0.50–0.97)
Globulin: 2.5 g/dL (calc) (ref 1.9–3.7)
Glucose, Bld: 81 mg/dL (ref 65–99)
Potassium: 4 mmol/L (ref 3.5–5.3)
Sodium: 139 mmol/L (ref 135–146)
Total Bilirubin: 0.7 mg/dL (ref 0.2–1.2)
Total Protein: 6.7 g/dL (ref 6.1–8.1)
eGFR: 92 mL/min/{1.73_m2} (ref >=60)

## 2022-09-20 LAB — CBC WITH DIFFERENTIAL/PLATELET
Absolute Monocytes: 576 cells/uL (ref 200–950)
Basophils Absolute: 67 cells/uL (ref 0–200)
Basophils Relative: 0.7 %
Eosinophils Absolute: 96 cells/uL (ref 15–500)
Eosinophils Relative: 1 %
HCT: 36 % (ref 35.0–45.0)
Hemoglobin: 11.7 g/dL (ref 11.7–15.5)
Lymphs Abs: 4109 cells/uL — ABNORMAL HIGH (ref 850–3900)
MCH: 26.5 pg — ABNORMAL LOW (ref 27.0–33.0)
MCHC: 32.5 g/dL (ref 32.0–36.0)
MCV: 81.6 fL (ref 80.0–100.0)
MPV: 9.8 fL (ref 7.5–12.5)
Monocytes Relative: 6 %
Neutro Abs: 4752 cells/uL (ref 1500–7800)
Neutrophils Relative %: 49.5 %
Platelets: 370 10*3/uL (ref 140–400)
RBC: 4.41 10*6/uL (ref 3.80–5.10)
RDW: 13.8 % (ref 11.0–15.0)
Total Lymphocyte: 42.8 %
WBC: 9.6 10*3/uL (ref 3.8–10.8)

## 2022-09-20 LAB — TSH: TSH: 1.31 mIU/L

## 2022-12-01 ENCOUNTER — Encounter (HOSPITAL_COMMUNITY): Payer: Self-pay

## 2022-12-01 ENCOUNTER — Telehealth: Payer: Self-pay | Admitting: Family Medicine

## 2022-12-01 ENCOUNTER — Ambulatory Visit (HOSPITAL_COMMUNITY)
Admission: EM | Admit: 2022-12-01 | Discharge: 2022-12-01 | Disposition: A | Discharge status: Home / Self Care | Payer: BC Managed Care – PPO | Attending: Internal Medicine | Admitting: Internal Medicine

## 2022-12-01 DIAGNOSIS — R059 Cough, unspecified: Secondary | ICD-10-CM | POA: Diagnosis not present

## 2022-12-01 DIAGNOSIS — J069 Acute upper respiratory infection, unspecified: Secondary | ICD-10-CM | POA: Diagnosis not present

## 2022-12-01 DIAGNOSIS — Z8709 Personal history of other diseases of the respiratory system: Secondary | ICD-10-CM | POA: Diagnosis not present

## 2022-12-01 DIAGNOSIS — Z1152 Encounter for screening for COVID-19: Secondary | ICD-10-CM | POA: Insufficient documentation

## 2022-12-01 MED ORDER — PROMETHAZINE-DM 6.25-15 MG/5ML PO SYRP: 5.0000 mL | ORAL_SOLUTION | Freq: Every evening | ORAL | 0 refills | Status: DC | PRN

## 2022-12-01 MED ORDER — BENZONATATE 100 MG PO CAPS: 100.0000 mg | ORAL_CAPSULE | Freq: Three times a day (TID) | ORAL | 0 refills | Status: DC

## 2022-12-01 NOTE — ED Triage Notes (Signed)
Patient c/o a non productive cough, nasal congestion, and left ear pain x 4-5 days.  Patient states she has been taking Mucinex DM and Nyquil/Dayquil. Patient states she took Nyquil last night.

## 2022-12-01 NOTE — Discharge Instructions (Addendum)
You have a viral upper respiratory infection.  COVID-19 testing is pending. We will call you with results if positive. If your COVID test is positive, you must stay at home until day 6 of symptoms. On day 6, you may go out into public and go back to work, but you must wear a mask until day 11 of symptoms to prevent spread to others.  Use the following medicines to help with symptoms: - Plain Mucinex (guaifenesin) over the counter as directed every 12 hours to thin mucous so that you are able to get it out of your body easier. Drink plenty of water while taking this medication so that it works well in your body (at least 8 cups a day).  - Tylenol 1,063m and/or ibuprofen [id number] 6 hours with food as needed for aches/pains or fever/chills.  - Tessalon perles every 8 hours as needed for cough. - Take Promethazine DM cough medication to help with your cough at nighttime so that you are able to sleep. Do not drive, drink alcohol, or go to work while taking this medication since it can make you sleepy. Only take this at nighttime.   1 tablespoon of honey in warm water and/or salt water gargles may also help with symptoms. Humidifier to your room will help add water to the air and reduce coughing.  If you develop any new or worsening symptoms, please return.  If your symptoms are severe, please go to the emergency room.  Follow-up with your primary care provider for further evaluation and management of your symptoms as well as ongoing wellness visits.  I hope you feel better!

## 2022-12-01 NOTE — ED Provider Notes (Signed)
[place]    CSN: [id number] Arrival date & time: 12/01/22  [id number]      History   Chief Complaint Chief Complaint  Patient presents with   Cough   Nasal Congestion   Otalgia    HPI Sheri Malone is a 37 y.o. female.   Patient presents to urgent care for evaluation of cough, nasal congestion, bilateral ear pain, sore throat, generalized fatigue, and generalized weakness that started 5 days ago on November 27, 2022.  Symptoms began with a sore throat that improved initially, but then worsened with development of other symptoms.  Cough sounds wet but is dry and nonproductive.  Sore throat is not currently bothering her as much is the cough and nasal congestion.  Denies headache, dizziness, extremity weakness, shortness of breath, chest pain, heart palpitations, and vision changes.  Patient states she had pneumonia lung infection 6 months ago and is very concerned that she may be developing same.  No recent antibiotic or steroid use.  History of asthma that is well-controlled with as needed use of albuterol inhaler as well as Symbicort daily.  She has been using her Symbicort as prescribed and has not needed to use her albuterol inhaler while ill recently.  Her son has been sick with similar symptoms and so was her coworker.  She has been using DayQuil and NyQuil as well as Mucinex DM with little relief of symptoms.   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   Left lower lobe pneumonia 03/30/2022   SIRS, possible  sepsis (systemic inflammatory response syndrome) ([place]) 03/30/2022   S/P coil embolization of splenic artery aneurysm 03/25/22 03/30/2022   Vertigo 11/24/2021   BPPV (benign paroxysmal positional vertigo), left 11/15/2021   Splenic artery aneurysm ([place]) 08/06/2021   Effusion of right ankle 07/31/2021   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   Iron deficiency anemia 11/15/2020   Slow transit constipation 11/15/2020   Microcytosis 11/15/2020   Arthralgia of right ankle 08/15/2020   Stiffness of right ankle joint 08/15/2020   Closed trimalleolar fracture of right ankle 05/21/2020   Gastro-esophageal reflux disease without esophagitis 04/13/2020   Obesity, Class III, BMI 40-49.9 (morbid obesity) ([place]) 01/20/2020   Vitamin D deficiency 01/20/2020   Prediabetes 01/20/2020   Insulin resistance [id number]   Metabolic syndrome [id number]   Allergic rhinitis 11/23/2016   Allergic conjunctivitis 01/17/2016   Mild intermittent asthma 01/17/2016   Dermatitis, eczematoid 06/21/2015   H/O cesarean section 06/21/2015   History of diabetes mellitus arising in pregnancy 06/21/2015   Headache, migraine 06/21/2015    Past Surgical History:  Procedure Laterality Date   ANKLE FRACTURE SURGERY Right    x2   CESAREAN SECTION N/A 05/20/2013   Procedure: CESAREAN SECTION;  Surgeon: Princess Bruins, MD;  Location: [facility];  Service: Obstetrics;  Laterality: N/A;  primary   CESAREAN SECTION N/A 02/04/2018   Procedure: Repeat CESAREAN  SECTION;  Surgeon: Aloha Gell, MD;  Location: [facility];  Service: Obstetrics;  Laterality: N/A;  EDD: 02/16/18   CHOLECYSTECTOMY N/A 09/28/2018   Procedure: LAPAROSCOPIC CHOLECYSTECTOMY;  Surgeon: Coralie Keens, MD;  Location: [facility];  Service: General;  Laterality: N/A;   DILATION AND CURETTAGE OF UTERUS  10/21/2007   GASTRIC ROUX-EN-Y N/A 07/07/2022   Procedure: LAPAROSCOPIC ROUX-EN-Y GASTRIC BYPASS WITH UPPER ENDOSCOPY;  Surgeon: Greer Pickerel, MD;   Location: [facility];  Service: General;  Laterality: N/A;   HIATAL HERNIA REPAIR N/A 07/07/2022   Procedure: HERNIA REPAIR HIATAL;  Surgeon: Greer Pickerel, MD;  Location: [facility];  Service: General;  Laterality: N/A;   SALPINGOOPHORECTOMY  10/21/2007   left   UNILATERAL SALPINGECTOMY Right 02/04/2018   Procedure: UNILATERAL SALPINGECTOMY;  Surgeon: Aloha Gell, MD;  Location: [place];  Service: Obstetrics;  Laterality: Right;   VISCERAL ARTERY INTERVENTION N/A 03/25/2022   Procedure: VISCERAL ARTERY INTERVENTION;  Surgeon: Katha Cabal, MD;  Location: [facility];  Service: Cardiovascular;  Laterality: N/A;   WISDOM TOOTH EXTRACTION      OB History     Gravida  3   Para  2   Term  2   Preterm  0   AB  1   Living  2      SAB  0   IAB  0   Ectopic  1   Multiple      Live Births  2            Home Medications    Prior to Admission medications   Medication Sig Start Date End Date Taking? Authorizing Provider  benzonatate (TESSALON) 100 MG capsule Take 1 capsule (100 mg total) by mouth every 8 (eight) hours. 12/01/22  Yes Talbot Grumbling, FNP  promethazine-dextromethorphan (PROMETHAZINE-DM) 6.25-15 MG/5ML syrup Take 5 mLs by mouth at bedtime as needed for cough. 12/01/22  Yes Talbot Grumbling, FNP  albuterol (PROVENTIL HFA;VENTOLIN HFA) 108 (90 Base) MCG/ACT inhaler Inhale 1 puff into the lungs every 6 (six) hours as needed for wheezing or shortness of breath.    [provider]  azelastine (ASTELIN) 0.1 % nasal spray Place 1 spray into both nostrils daily. 02/25/19   [provider]  azelastine (OPTIVAR) 0.05 % ophthalmic solution Place 1 drop into both eyes daily as needed (Allergies). 02/25/19   [provider]  budesonide (PULMICORT) 0.5 MG/2ML nebulizer solution Take 2 mLs (0.5 mg total) by nebulization in the morning and at bedtime for 14 days. Patient taking differently: Take 0.5 mg by nebulization daily  as needed (Shortness of breath). 04/01/22 06/18/22  [name], PA-C  EPINEPHrine 0.3 mg/0.3 mL IJ SOAJ injection Inject 0.3 mg into the muscle as needed for anaphylaxis.    [provider]  fluticasone (FLONASE) 50 MCG/ACT nasal spray Place 1 spray into the nose daily as needed for allergies. 10/21/21   [provider]  ipratropium-albuterol (DUONEB) 0.5-2.5 (3) MG/3ML SOLN Take 3 mLs by nebulization every 6 (six) hours as needed (for wheeze, coughing spells, chest tightness, SOB). 04/01/22   [name], PA-C  levocetirizine (XYZAL) 5 MG tablet Take 5 mg by mouth every evening.    [provider]  meclizine (ANTIVERT) 25 MG tablet Take 12.5-25 mg by mouth 3 (three) times daily as needed for dizziness. 05/24/22   [provider]  montelukast (SINGULAIR) 10 MG tablet Take 10 mg by mouth at bedtime.    [provider]  Norethindrone  Acetate-Ethinyl Estrad-FE (JUNEL FE 24) 1-20 MG-MCG(24) tablet Take 1 tablet by mouth daily.    [provider]  ondansetron (ZOFRAN) 4 MG tablet Take 1 tablet (4 mg total) by mouth every 8 (eight) hours as needed for nausea or vomiting. 07/08/22   Greer Pickerel, MD  pantoprazole (PROTONIX) 40 MG tablet TAKE 1 TABLET (40 MG TOTAL) BY MOUTH IN THE MORNING 06/06/22   [name], PA-C  predniSONE (DELTASONE) 20 MG tablet Take 2 tablets (40 mg total) by mouth daily with breakfast. 08/26/22   Brunetta Jeans, PA-C  SUMAtriptan (IMITREX) 100 MG tablet Take 1 tablet (100 mg total) by mouth once as needed for up to 2 doses. May repeat in 2 hours if headache persists or recurs. 11/22/21   Melvenia Beam, MD  SYMBICORT 160-4.5 MCG/ACT inhaler INHALE 2 PUFFS INTO THE LUNGS IN THE MORNING AND AT BEDTIME. 07/10/21   [name], PA-C    Family History Family History  Problem Relation Age of Onset   Migraines Mother    Hypertension Mother    Breast cancer Mother    Diabetes Father    Hypertension Father    Multiple sclerosis  Sister    Cancer Maternal Grandfather        prostate   Heart disease Paternal Grandfather    Stomach cancer Neg Hx    Rectal cancer Neg Hx    Esophageal cancer Neg Hx    Colon cancer Neg Hx     Social History Social History   Tobacco Use   Smoking status: Never   Smokeless tobacco: Never  Vaping Use   Vaping Use: Never used  Substance Use Topics   Alcohol use: Yes    Comment: occasionally/once a month   Drug use: No     Allergies   Dust mite mixed allergen ext [mite (d. farinae)], Other, and Shellfish allergy   Review of Systems Review of Systems  HENT:  Positive for ear pain.   Respiratory:  Positive for cough.   Per HPI   Physical Exam Triage Vital Signs ED Triage Vitals  Enc Vitals Group     BP 12/01/22 0815 128/85     Pulse Rate 12/01/22 0815 100     Resp 12/01/22 0815 18     Temp 12/01/22 0815 99.1 F (37.3 C)     Temp Source 12/01/22 0815 Oral     SpO2 12/01/22 0815 98 %     Weight --      Height --      Head Circumference --      Peak Flow --      Pain Score 12/01/22 0817 8     Pain Loc --      Pain Edu? --      Excl. in [place]? --    No data found.  Updated Vital Signs BP 128/85 (BP Location: Left Arm)   Pulse 100   Temp 99.1 F (37.3 C) (Oral)   Resp 18   LMP 11/12/2022 (Approximate)   SpO2 98%   Visual Acuity Right Eye Distance:   Left Eye Distance:   Bilateral Distance:    Right Eye Near:   Left Eye Near:    Bilateral Near:     Physical Exam Vitals and nursing note reviewed.  Constitutional:      Appearance: She is ill-appearing. She is not toxic-appearing.  HENT:     Head: Normocephalic and atraumatic.     Right Ear: Hearing, tympanic membrane, ear canal  and external ear normal.     Left Ear: Hearing, tympanic membrane, ear canal and external ear normal.     Nose: Congestion present.     Mouth/Throat:     Lips: Pink.     Mouth: Mucous membranes are moist. No injury.     Tongue: No lesions. Tongue does not deviate from  midline.     Palate: No mass and lesions.     Pharynx: Oropharynx is clear. Uvula midline. Posterior oropharyngeal erythema present. No pharyngeal swelling, oropharyngeal exudate or uvula swelling.     Tonsils: No tonsillar exudate or tonsillar abscesses.  Eyes:     General: Lids are normal. Vision grossly intact. Gaze aligned appropriately.        Right eye: No discharge.        Left eye: No discharge.     Extraocular Movements: Extraocular movements intact.     Conjunctiva/sclera: Conjunctivae normal.  Cardiovascular:     Rate and Rhythm: Normal rate and regular rhythm.     Heart sounds: Normal heart sounds, S1 normal and S2 normal.  Pulmonary:     Effort: Pulmonary effort is normal. No respiratory distress.     Breath sounds: Normal breath sounds and air entry. No wheezing, rhonchi or rales.  Chest:     Chest wall: No tenderness.  Musculoskeletal:     Cervical back: Neck supple.     Right lower leg: No edema.     Left lower leg: No edema.  Lymphadenopathy:     Cervical: No cervical adenopathy.  Skin:    General: Skin is warm and dry.     Capillary Refill: Capillary refill takes less than 2 seconds.     Findings: No rash.  Neurological:     General: No focal deficit present.     Mental Status: She is alert and oriented to person, place, and time. Mental status is at baseline.     Cranial Nerves: No dysarthria or facial asymmetry.  Psychiatric:        Mood and Affect: Mood normal.        Speech: Speech normal.        Behavior: Behavior normal.        Thought Content: Thought content normal.        Judgment: Judgment normal.      UC Treatments / Results  Labs (all labs ordered are listed, but only abnormal results are displayed) Labs Reviewed  SARS CORONAVIRUS 2 (TAT 6-24 HRS)    EKG   Radiology No results found.  Procedures Procedures (including critical care time)  Medications Ordered in UC Medications - No data to display  Initial Impression /  Assessment and Plan / UC Course  I have reviewed the triage vital signs and the nursing notes.  Pertinent labs & imaging results that were available during my care of the patient were reviewed by me and considered in my medical decision making (see chart for details).   1. Viral URI with cough Symptoms and physical exam consistent with a viral upper respiratory tract infection that will likely resolve with rest, fluids, and prescriptions for symptomatic relief. Deferred imaging based on stable cardiopulmonary exam and hemodynamically stable vital signs.  COVID-19 testing is pending.  We will call patient if this is positive.  Quarantine guidelines discussed. Currently on day 5 of symptoms and does qualify for antiviral therapy.   Tessalon Perles and Promethazine DM sent to pharmacy for symptomatic relief to be taken as prescribed.  May  continue taking over the counter medications as directed for further symptomatic relief.  Drowsiness precautions discussed regarding promethazine DM prescription.  Nonpharmacologic interventions for symptom relief provided and after visit summary below. Advised to push fluids to stay well hydrated while recovering from viral illness.   Discussed physical exam and available lab work findings in clinic with patient.  Counseled patient regarding appropriate use of medications and potential side effects for all medications recommended or prescribed today. Discussed red flag signs and symptoms of worsening condition,when to call the PCP office, return to urgent care, and when to seek higher level of care in the emergency department. Patient verbalizes understanding and agreement with plan. All questions answered. Patient discharged in stable condition.   Final Clinical Impressions(s) / UC Diagnoses   Final diagnoses:  Viral URI with cough  History of asthma     Discharge Instructions      You have a viral upper respiratory infection.  COVID-19 testing is pending.  We will call you with results if positive. If your COVID test is positive, you must stay at home until day 6 of symptoms. On day 6, you may go out into public and go back to work, but you must wear a mask until day 11 of symptoms to prevent spread to others.  Use the following medicines to help with symptoms: - Plain Mucinex (guaifenesin) over the counter as directed every 12 hours to thin mucous so that you are able to get it out of your body easier. Drink plenty of water while taking this medication so that it works well in your body (at least 8 cups a day).  - Tylenol 1,030m and/or ibuprofen 6015mevery 6 hours with food as needed for aches/pains or fever/chills.  - Tessalon perles every 8 hours as needed for cough. - Take Promethazine DM cough medication to help with your cough at nighttime so that you are able to sleep. Do not drive, drink alcohol, or go to work while taking this medication since it can make you sleepy. Only take this at nighttime.   1 tablespoon of honey in warm water and/or salt water gargles may also help with symptoms. Humidifier to your room will help add water to the air and reduce coughing.  If you develop any new or worsening symptoms, please return.  If your symptoms are severe, please go to the emergency room.  Follow-up with your primary care provider for further evaluation and management of your symptoms as well as ongoing wellness visits.  I hope you feel better!    ED Prescriptions     Medication Sig Dispense Auth. Provider   benzonatate (TESSALON) 100 MG capsule Take 1 capsule (100 mg total) by mouth every 8 (eight) hours. 21 capsule StTalbot GrumblingFNP   promethazine-dextromethorphan (PROMETHAZINE-DM) 6.25-15 MG/5ML syrup Take 5 mLs by mouth at bedtime as needed for cough. 118 mL StTalbot GrumblingFNP      PDMP not reviewed this encounter.   StJoella Prince,[place]arlvilleFNN[place]2/12/24 [phone]

## 2022-12-02 LAB — SARS CORONAVIRUS 2 (TAT 6-24 HRS): SARS Coronavirus 2: NEGATIVE

## 2023-01-01 ENCOUNTER — Ambulatory Visit: Payer: 59 | Admitting: Skilled Nursing Facility1

## 2023-01-01 ENCOUNTER — Encounter: Payer: Self-pay | Admitting: Dietician

## 2023-01-01 ENCOUNTER — Encounter: Payer: BC Managed Care – PPO | Attending: General Surgery | Admitting: Dietician

## 2023-01-01 VITALS — Ht 62.0 in | Wt 170.6 lb

## 2023-01-01 DIAGNOSIS — E669 Obesity, unspecified: Secondary | ICD-10-CM | POA: Insufficient documentation

## 2023-01-01 NOTE — Progress Notes (Signed)
Bariatric Nutrition Follow-Up Visit Medical Nutrition Therapy   Surgery date: 07/07/2022 Surgery type: RYGB  NUTRITION ASSESSMENT   Anthropometrics  Start weight at [facility]: 226 lbs (date: 11/20/2021)  Height: 62 in Weight today: 189.1 pounds    Clinical  Medical hx: vertigo, nausea, GERD Medications: see list  Labs: alkaline phosphatase 33, LDL 103, A1C 6.0 Notable signs/symptoms: monthly migraines  Any previous deficiencies? No   Body Composition Scale 07/22/2022 09/02/2022 01/01/2023  Current Body Weight 205.5 189.1 170.6  Total Body Fat % 40.9 38.4 34.1  Visceral Fat '12 10 8  '$ Fat-Free Mass % 59.0 61.5 65.8   Total Body Water % 44.0 45.2 47.4  Muscle-Mass lbs 30.5 30.3 31.0  BMI 37.4 34.4 29.8  Body Fat Displacement            Torso  lbs 52.0 44.9 35.9         Left Leg  lbs 10.4 8.9 7.1         Right Leg  lbs 10.4 8.9 7.1         Left Arm  lbs 5.2 4.4 3.5         Right Arm   lbs 5.2 4.4 3.5     Lifestyle & Dietary Hx  Pt states she was doing well with physical activity right after surgery, but states she changed jobs and has a longer commute, extending her day, limiting the time for physical activity. 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  Pt states her goal is 155. Pt states chicken is still hard to tolerate, stating it does not digest for her. Pt states she is trying to find more variety of food that she likes to eat.  Estimated daily fluid intake: 64+ oz Estimated daily protein intake: 60 g Supplements: multi and calcium  Current average weekly physical activity: walk 1-2 miles, 1 day a week.  24-Hr Dietary Recall First Meal 8:45-9:30am: yogurt or oatmeal with stevia, coffee Snack:  popcorn or craisins (dark chocolate) Second Meal: salmi and cheese + vinagerrette or moes steak and cheese or salad with a protein (salmon or taco meat), or left-overs with potato with cheese. Snack:  yogurt or protein shake Third Meal: same as  lunch Snack: maybe nuts or half a protein shake Beverages: coffee, water, gatorade zero, occasionally half and half tea, zero calorie sprite (sip on it for a week)  Post-Op Goals/ Signs/ Symptoms Using straws: no Drinking while eating: no Chewing/swallowing difficulties: no Changes in vision: no Changes to mood/headaches: no Hair loss/changes to skin/nails: no Difficulty focusing/concentrating: no Sweating: no Limb weakness: on Dizziness/lightheadedness: no Palpitations: no  Carbonated/caffeinated beverages: no N/V/D/C/Gas: no Abdominal pain: no Dumping syndrome: no    NUTRITION DIAGNOSIS  Overweight/obesity ([place]-3.3) related to past poor dietary habits and physical inactivity as evidenced by completed bariatric surgery and following dietary guidelines for continued weight loss and healthy nutrition status.     NUTRITION INTERVENTION Nutrition counseling (C-1) and education (E-2) to facilitate bariatric surgery goals, including: The importance of consuming adequate calories as well as certain nutrients daily due to the body's need for essential vitamins, minerals, and fats The importance of daily physical activity and to reach a goal of at least 150 minutes of moderate to vigorous physical activity weekly (or as directed by their physician) due to benefits such as increased musculature and improved lab values The importance of intuitive eating specifically learning hunger-satiety cues and understanding the importance of learning a  new body: The importance of mindful eating to avoid grazing behaviors  Encouraged patient to honor their body's internal hunger and fullness cues.  Throughout the day, check in mentally and rate hunger. Stop eating when satisfied not full regardless of how much food is left on the plate.  Get more if still hungry 20-30 minutes later.  The key is to honor satisfaction so throughout the meal, rate fullness factor and stop when comfortably satisfied not  physically full. The key is to honor hunger and fullness without any feelings of guilt or shame.  Pay attention to what the internal cues are, rather than any external factors. This will enhance the confidence you have in listening to your own body and following those internal cues enabling you to increase how often you eat when you are hungry not out of appetite and stop when you are satisfied not full.  Encouraged pt to continue to eat balanced meals inclusive of non starchy vegetables 2 times a day 7 days a week Encouraged pt to choose lean protein sources: limiting beef, pork, sausage, hotdogs, and lunch meat Encourage pt to choose healthy fats such as plant based limiting animal fats Encouraged pt to continue to drink a minium 64 fluid ounces with half being plain water to satisfy proper hydration   Goals -increase physical activity, play basketball with daughter, walking or light jog -purchase non-starchy vegetable snacks to have on hand at home  Handouts Provided Include  6 Months to Maintenance Handout with meal and snack ideas.  Learning Style & Readiness for Change Teaching method utilized: Visual & Auditory  Demonstrated degree of understanding via: Teach Back  Readiness Level: contemplative Barriers to learning/adherence to lifestyle change: unidentified   RD's Notes for Next Visit Assess adherence to pt chosen goals    MONITORING & EVALUATION Dietary intake, weekly physical activity, body weight  Next Steps Patient is to follow-up in 3 months

## 2023-01-15 DIAGNOSIS — H1045 Other chronic allergic conjunctivitis: Secondary | ICD-10-CM | POA: Diagnosis not present

## 2023-01-15 DIAGNOSIS — R052 Subacute cough: Secondary | ICD-10-CM | POA: Diagnosis not present

## 2023-01-15 DIAGNOSIS — J3089 Other allergic rhinitis: Secondary | ICD-10-CM | POA: Diagnosis not present

## 2023-01-20 DIAGNOSIS — E569 Vitamin deficiency, unspecified: Secondary | ICD-10-CM | POA: Diagnosis not present

## 2023-01-20 DIAGNOSIS — Z1321 Encounter for screening for nutritional disorder: Secondary | ICD-10-CM | POA: Diagnosis not present

## 2023-01-20 DIAGNOSIS — R5383 Other fatigue: Secondary | ICD-10-CM | POA: Diagnosis not present

## 2023-01-22 DIAGNOSIS — E569 Vitamin deficiency, unspecified: Secondary | ICD-10-CM | POA: Diagnosis not present

## 2023-01-22 DIAGNOSIS — Z1321 Encounter for screening for nutritional disorder: Secondary | ICD-10-CM | POA: Diagnosis not present

## 2023-01-22 DIAGNOSIS — R5383 Other fatigue: Secondary | ICD-10-CM | POA: Diagnosis not present

## 2023-02-05 NOTE — Patient Instructions (Signed)

## 2023-02-06 ENCOUNTER — Encounter: Payer: Self-pay | Admitting: Family Medicine

## 2023-02-06 ENCOUNTER — Encounter: Payer: 59 | Admitting: Family Medicine

## 2023-02-06 ENCOUNTER — Ambulatory Visit (INDEPENDENT_AMBULATORY_CARE_PROVIDER_SITE_OTHER): Payer: BC Managed Care – PPO | Admitting: Family Medicine

## 2023-02-06 VITALS — BP 120/84 | HR 99 | Temp 97.5°F | Resp 16 | Ht 63.0 in | Wt 167.6 lb

## 2023-02-06 DIAGNOSIS — Z8632 Personal history of gestational diabetes: Secondary | ICD-10-CM | POA: Diagnosis not present

## 2023-02-06 DIAGNOSIS — Z3041 Encounter for surveillance of contraceptive pills: Secondary | ICD-10-CM

## 2023-02-06 DIAGNOSIS — N921 Excessive and frequent menstruation with irregular cycle: Secondary | ICD-10-CM | POA: Diagnosis not present

## 2023-02-06 DIAGNOSIS — Z803 Family history of malignant neoplasm of breast: Secondary | ICD-10-CM

## 2023-02-06 DIAGNOSIS — R7303 Prediabetes: Secondary | ICD-10-CM

## 2023-02-06 DIAGNOSIS — Z1231 Encounter for screening mammogram for malignant neoplasm of breast: Secondary | ICD-10-CM

## 2023-02-06 DIAGNOSIS — N951 Menopausal and female climacteric states: Secondary | ICD-10-CM

## 2023-02-06 DIAGNOSIS — Z5181 Encounter for therapeutic drug level monitoring: Secondary | ICD-10-CM

## 2023-02-06 DIAGNOSIS — G4700 Insomnia, unspecified: Secondary | ICD-10-CM | POA: Diagnosis not present

## 2023-02-06 DIAGNOSIS — Z0000 Encounter for general adult medical examination without abnormal findings: Secondary | ICD-10-CM | POA: Diagnosis not present

## 2023-02-06 MED ORDER — JUNEL FE 24 1-20 MG-MCG(24) PO TABS
1.0000 | ORAL_TABLET | Freq: Every day | ORAL | 3 refills | Status: DC
Start: 2023-02-06 — End: 2024-02-16

## 2023-02-06 NOTE — Progress Notes (Deleted)
Patient: Sheri Malone, Female    DOB: Sep 15, 1986, 37 y.o.   MRN: [id number] Danelle Berry, PA-C Visit Date: 02/06/2023  Today's Provider: Danelle Berry, PA-C   Chief Complaint  Patient presents with   Annual Exam   Subjective:   Annual physical exam:  Sheri Malone is a 37 y.o. female who presents today for health maintenance and annual & complete physical exam.   Exercise/Activity:  exercising more - treadmill Diet/nutrition:  sig diet changes,  Sleep: not sleeping well   SDOH Screenings   Food Insecurity: No Food Insecurity (02/06/2023)  Housing: Low Risk  (02/06/2023)  Transportation Needs: No Transportation Needs (02/06/2023)  Utilities: Not At Risk (02/06/2023)  Alcohol Screen: Low Risk  (02/06/2023)  Depression (PHQ2-9): Low Risk  (02/06/2023)  Financial Resource Strain: Low Risk  (02/06/2023)  Physical Activity: Sufficiently Active (02/06/2023)  Social Connections: Socially Integrated (02/06/2023)  Stress: No Stress Concern Present (02/06/2023)  Tobacco Use: Low Risk  (02/06/2023)    USPSTF grade A and B recommendations - reviewed and addressed today  Depression:  Phq 9 completed today by patient, was reviewed by me with patient in the room, score is  negative, pt feels good    02/06/2023    3:03 PM 09/19/2022    2:50 PM 04/01/2022    9:33 AM  Depression screen PHQ 2/9  Decreased Interest 0 0 0  Down, Depressed, Hopeless 0 0 0  PHQ - 2 Score 0 0 0  Altered sleeping 0 0 0  Tired, decreased energy 0 0 0  Change in appetite 0 0 0  Feeling bad or failure about yourself  0 0 0  Trouble concentrating 0 0 0  Moving slowly or fidgety/restless 0 0 0  Suicidal thoughts 0 0 0  PHQ-9 Score 0 0 0  Difficult doing work/chores Not difficult at all Not difficult at all Not difficult at all    Hep C Screening: done  STD testing and prevention (HIV/chl/gon/syphilis): no exposure/concern prior testing done  Intimate partner violence:  safe at home  Advanced Care Planning:   A voluntary discussion about advance care planning including the explanation and discussion of advance directives.  Discussed health care proxy and Living will, and the patient was able to identify a health care proxy as ***.  Patient {DOES_DOES ION:62952} have a living will at present time. If patient does have living will, I have requested they bring this to the clinic to be scanned in to their chart.  Health Maintenance  Topic Date Due   COVID-19 Vaccine (5 - 2023-24 season) 02/22/2023 (Originally 06/20/2022)   INFLUENZA VACCINE  05/21/2023   PAP SMEAR-Modifier  09/21/2023   DTaP/Tdap/Td (2 - Td or Tdap) 01/16/2026   Hepatitis C Screening  Completed   HIV Screening  Completed   HPV VACCINES  Aged Out    Skin cancer: ***  last skin survey was.  Pt reports *** hx of skin cancer, suspicious lesions/biopsies in the past.  Colorectal cancer:  colonoscopy is ***   Pt denies ***  Urinary Symptoms:    Lung cancer:  *** Low Dose CT Chest recommended if Age 64-80 years, 20 pack-year currently smoking OR have quit w/in 15years. Patient {DOES NOT does:27190::"does not"} qualify.   Social History   Tobacco Use   Smoking status: Never   Smokeless tobacco: Never  Substance Use Topics   Alcohol use: Yes    Comment: occasionally/once a month     Alcohol screening: Garment/textile technologist  Visit from 02/06/2023 in [facility]  AUDIT-C Score 1       AAA: *** The USPSTF recommends one-time screening with ultrasonography in men ages 24 to 23 years who have ever smoked  ECG:***  Blood pressure/Hypertension: BP Readings from Last 3 Encounters:  02/06/23 120/84  12/01/22 128/85  09/19/22 136/78   Weight/Obesity: Wt Readings from Last 3 Encounters:  02/06/23 167 lb 9.6 oz (76 kg)  01/01/23 170 lb 9.6 oz (77.4 kg)  09/19/22 187 lb (84.8 kg)   BMI Readings from Last 3 Encounters:  02/06/23 29.69 kg/m  01/01/23 31.20 kg/m  09/19/22 33.13 kg/m    Lipids:   Lab Results  Component Value Date   CHOL 135 05/08/2022   CHOL 138 01/27/2014   Lab Results  Component Value Date   HDL 48 (L) 05/08/2022   HDL 42 01/27/2014   Lab Results  Component Value Date   LDLCALC 66 05/08/2022   LDLCALC 58 01/27/2014   Lab Results  Component Value Date   TRIG 120 05/08/2022   TRIG 188 (A) 01/27/2014   Lab Results  Component Value Date   CHOLHDL 2.8 05/08/2022   No results found for: "LDLDIRECT" Based on the results of lipid panel his/her cardiovascular risk factor ( using Po[place] Cohort )  in the next 10 years is : The ASCVD Risk score (Arnett DK, et al., 2019) failed to calculate for the following reasons:   The 2019 ASCVD risk score is only valid for ages 81 to 37 Glucose:  Glucose, Bld  Date Value Ref Range Status  09/19/2022 81 65 - 99 mg/dL Final    Comment:    .            Fasting reference interval .   07/08/2022 154 (H) 70 - 99 mg/dL Final    Comment:    Glucose reference range applies only to samples taken after fasting for at least 8 hours.  06/26/2022 87 70 - 99 mg/dL Final    Comment:    Glucose reference range applies only to samples taken after fasting for at least 8 hours.    Social History       Social History   Socioeconomic History   Marital status: Married    Spouse name: Antionette Char   Number of children: 2   Years of education: Not on file   Highest education level: Master's degree (e.g., MA, MS, MEng, MEd, MSW, MBA)  Occupational History   Not on file  Tobacco Use   Smoking status: Never   Smokeless tobacco: Never  Vaping Use   Vaping Use: Never used  Substance and Sexual Activity   Alcohol use: Yes    Comment: occasionally/once a month   Drug use: No   Sexual activity: Yes    Partners: Male    Birth control/protection: Pill  Other Topics Concern   Not on file  Social History Narrative   Lives at home with husband and daughter and son.   Right handed   Social Determinants of Health   Financial  Resource Strain: Low Risk  (02/06/2023)   Overall Financial Resource Strain (CARDIA)    Difficulty of Paying Living Expenses: Not hard at all  Food Insecurity: No Food Insecurity (02/06/2023)   Hunger Vital Sign    Worried About Running Out of Food in the Last Year: Never true    Ran Out of Food in the Last Year: Never true  Transportation Needs: No Transportation Needs (02/06/2023)  PRAPARE - Administrator, Civil Service (Medical): No    Lack of Transportation (Non-Medical): No  Physical Activity: Sufficiently Active (02/06/2023)   Exercise Vital Sign    Days of Exercise per Week: 5 days    Minutes of Exercise per Session: 60 min  Stress: No Stress Concern Present (02/06/2023)   [employer] of Occupational Health - Occupational Stress Questionnaire    Feeling of Stress : Only a little  Social Connections: Socially Integrated (02/06/2023)   Social Connection and Isolation Panel [NHANES]    Frequency of Communication with Friends and Family: More than three times a week    Frequency of Social Gatherings with Friends and Family: More than three times a week    Attends Religious Services: More than 4 times per year    Active Member of Clubs or Organizations: Yes    Attends Engineer, structural: More than 4 times per year    Marital Status: Married    Family History        Family History  Problem Relation Age of Onset   Migraines Mother    Hypertension Mother    Breast cancer Mother    Diabetes Father    Hypertension Father    Multiple sclerosis Sister    Cancer Maternal Grandfather        prostate   Heart disease Paternal Grandfather    Cancer Maternal Grandmother    Stomach cancer Neg Hx    Rectal cancer Neg Hx    Esophageal cancer Neg Hx    Colon cancer Neg Hx     Patient Active Problem List   Diagnosis Date Noted   S/P gastric bypass 07/07/2022   Left lower lobe pneumonia 03/30/2022   SIRS, possible sepsis (systemic inflammatory response  syndrome) (HCC) 03/30/2022   S/P coil embolization of splenic artery aneurysm 03/25/22 03/30/2022   Vertigo 11/24/2021   BPPV (benign paroxysmal positional vertigo), left 11/15/2021   Splenic artery aneurysm 08/06/2021   Effusion of right ankle 07/31/2021   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   Iron deficiency anemia 11/15/2020   Slow transit constipation 11/15/2020   Microcytosis 11/15/2020   Arthralgia of right ankle 08/15/2020   Stiffness of right ankle joint 08/15/2020   Closed trimalleolar fracture of right ankle 05/21/2020   Gastro-esophageal reflux disease without esophagitis 04/13/2020   Obesity, Class III, BMI 40-49.9 (morbid obesity) 01/20/2020   Vitamin D deficiency 01/20/2020   Prediabetes 01/20/2020   Insulin resistance 01/20/2020   Metabolic syndrome 01/20/2020   Allergic rhinitis 11/23/2016   Allergic conjunctivitis 01/17/2016   Mild intermittent asthma 01/17/2016   Dermatitis, eczematoid 06/21/2015   H/O cesarean section 06/21/2015   History of diabetes mellitus arising in pregnancy 06/21/2015   Headache, migraine 06/21/2015    Past Surgical History:  Procedure Laterality Date   ANKLE FRACTURE SURGERY Right    x2   CESAREAN SECTION N/A 05/20/2013   Procedure: CESAREAN SECTION;  Surgeon: Genia Del, MD;  Location: [facility]S;  Service: Obstetrics;  Laterality: N/A;  primary   CESAREAN SECTION N/A 02/04/2018   Procedure: Repeat CESAREAN SECTION;  Surgeon: Noland Fordyce, MD;  Location: [facility]TH[facility] SU[facility];  Service: Obstetrics;  Laterality: N/A;  EDD: 02/16/18   CHOLECYSTECTOMY N/A 09/28/2018   Procedure: LAPAROSCOPIC CHOLECYSTECTOMY;  Surgeon: Abigail Miyamoto, MD;  Location: [facility] OR;  Service: General;  Laterality: N/A;   DILATION AND CURETTAGE OF UTERUS  10/21/2007   GASTRIC ROUX-EN-Y N/A 07/07/2022   Procedure: LAPAROSCOPIC ROUX-EN-Y GASTRIC BYPASS  WITH UPPER ENDOSCOPY;  Surgeon: Gaynelle Adu, MD;  Location: [facility] ORS;  Service: General;  Laterality: N/A;    HIATAL HERNIA REPAIR N/A 07/07/2022   Procedure: HERNIA REPAIR HIATAL;  Surgeon: Gaynelle Adu, MD;  Location: [facility];  Service: General;  Laterality: N/A;   SALPINGOOPHORECTOMY  10/21/2007   left   UNILATERAL SALPINGECTOMY Right 02/04/2018   Procedure: UNILATERAL SALPINGECTOMY;  Surgeon: Noland Fordyce, MD;  Location: [facility];  Service: Obstetrics;  Laterality: Right;   VISCERAL ARTERY INTERVENTION N/A 03/25/2022   Procedure: VISCERAL ARTERY INTERVENTION;  Surgeon: Renford Dills, MD;  Location: [facility] INVASIVE CV LAB;  Service: Cardiovascular;  Laterality: N/A;   WISDOM TOOTH EXTRACTION       Current Outpatient Medications:    albuterol (PROVENTIL HFA;VENTOLIN HFA) 108 (90 Base) MCG/ACT inhaler, Inhale 1 puff into the lungs every 6 (six) hours as needed for wheezing or shortness of breath., Disp: , Rfl:    azelastine (ASTELIN) 0.1 % nasal spray, Place 1 spray into both nostrils daily., Disp: , Rfl:    azelastine (OPTIVAR) 0.05 % ophthalmic solution, Place 1 drop into both eyes daily as needed (Allergies)., Disp: , Rfl:    EPINEPHrine 0.3 mg/0.3 mL IJ SOAJ injection, Inject 0.3 mg into the muscle as needed for anaphylaxis., Disp: , Rfl:    fluticasone (FLONASE) 50 MCG/ACT nasal spray, Place 1 spray into the nose daily as needed for allergies., Disp: , Rfl:    ipratropium-albuterol (DUONEB) 0.5-2.5 (3) MG/3ML SOLN, Take 3 mLs by nebulization every 6 (six) hours as needed (for wheeze, coughing spells, chest tightness, SOB)., Disp: 180 mL, Rfl: 1   levocetirizine (XYZAL) 5 MG tablet, Take 5 mg by mouth every evening., Disp: , Rfl:    meclizine (ANTIVERT) 25 MG tablet, Take 12.5-25 mg by mouth 3 (three) times daily as needed for dizziness., Disp: , Rfl:    montelukast (SINGULAIR) 10 MG tablet, Take 10 mg by mouth at bedtime., Disp: , Rfl:    Norethindrone Acetate-Ethinyl Estrad-FE (JUNEL FE 24) 1-20 MG-MCG(24) tablet, Take 1 tablet by mouth daily., Disp: , Rfl:    ondansetron (ZOFRAN)  4 MG tablet, Take 1 tablet (4 mg total) by mouth every 8 (eight) hours as needed for nausea or vomiting., Disp: 20 tablet, Rfl: 0   pantoprazole (PROTONIX) 40 MG tablet, TAKE 1 TABLET (40 MG TOTAL) BY MOUTH IN THE MORNING, Disp: 90 tablet, Rfl: 2   SUMAtriptan (IMITREX) 100 MG tablet, Take 1 tablet (100 mg total) by mouth once as needed for up to 2 doses. May repeat in 2 hours if headache persists or recurs., Disp: 10 tablet, Rfl: 12   SYMBICORT 160-4.5 MCG/ACT inhaler, INHALE 2 PUFFS INTO THE LUNGS IN THE MORNING AND AT BEDTIME., Disp: 10.2 each, Rfl: 1   benzonatate (TESSALON) 100 MG capsule, Take 1 capsule (100 mg total) by mouth every 8 (eight) hours., Disp: 21 capsule, Rfl: 0   budesonide (PULMICORT) 0.5 MG/2ML nebulizer solution, Take 2 mLs (0.5 mg total) by nebulization in the morning and at bedtime for 14 days. (Patient taking differently: Take 0.5 mg by nebulization daily as needed (Shortness of breath).), Disp: 56 mL, Rfl: 0   predniSONE (DELTASONE) 20 MG tablet, Take 2 tablets (40 mg total) by mouth daily with breakfast., Disp: 10 tablet, Rfl: 0   promethazine-dextromethorphan (PROMETHAZINE-DM) 6.25-15 MG/5ML syrup, Take 5 mLs by mouth at bedtime as needed for cough., Disp: 118 mL, Rfl: 0  Allergies  Allergen Reactions   Dust Mite Mixed Allergen  Ext [Mite (D. Farinae)] Anaphylaxis   Other Anaphylaxis and Other (See Comments)    DUST ROACH   Shellfish Allergy     Patient Care Team: Danelle Berry, PA-C as PCP - General (Family Medicine) Debbe Odea, MD as PCP - Cardiology (Cardiology) Sidney Ace, MD as Referring Physician (Allergy) Danelle Berry, PA-C (Family Medicine)   Chart Review: ***  Review of Systems        Objective:   Vitals:  Vitals:   02/06/23 1506  BP: 120/84  Pulse: 99  Resp: 16  Temp: (!) 97.5 F (36.4 C)  TempSrc: Oral  SpO2: 100%  Weight: 167 lb 9.6 oz (76 kg)  Height: 5\' 3"  (1.6 m)    Body mass index is 29.69 kg/m.  Physical  Exam   Recent Results (from the past 2160 hour(s))  SARS CORONAVIRUS 2 (TAT 6-24 HRS) Anterior Nasal Swab     Status: None   Collection Time: 12/01/22  8:29 AM   Specimen: Anterior Nasal Swab  Result Value Ref Range   SARS Coronavirus 2 NEGATIVE NEGATIVE    Comment: (NOTE) SARS-CoV-2 target nucleic acids are NOT DETECTED.  The SARS-CoV-2 RNA is generally detectable in upper and lower respiratory specimens during the acute phase of infection. Negative results do not preclude SARS-CoV-2 infection, do not rule out co-infections with other pathogens, and should not be used as the sole basis for treatment or other patient management decisions. Negative results must be combined with clinical observations, patient history, and epidemiological information. The expected result is Negative.  Fact Sheet for Patients: HairSlick.no  Fact Sheet for Healthcare Providers: quierodirigir.com  This test is not yet approved or cleared by the [place] FDA and  has been authorized for detection and/or diagnosis of SARS-CoV-2 by FDA under an Emergency Use Authorization (EUA). This EUA will remain  in effect (meaning this test can be used) for the duration of the COVID-19 declaration under Se ction 564(b)(1) of the Act, 21 U.S.C. section 360bbb-3(b)(1), unless the authorization is terminated or revoked sooner.  Performed at [facility], [address] [place], [place] [address]     Fall Risk:    02/06/2023    3:03 PM 09/19/2022    2:50 PM 04/01/2022    9:33 AM 02/04/2022    8:25 AM 11/20/2021    4:04 PM  Fall Risk   Falls in the past year? 0 0 0 1 0  Number falls in past yr: 0 0 0 0   Injury with Fall? 0 0 0 1   Risk for fall due to : No Fall Risks No Fall Risks No Fall Risks Impaired balance/gait   Follow up Falls prevention discussed;Falls evaluation completed;Education provided Falls prevention discussed;Education  provided;Falls evaluation completed Falls prevention discussed;Education provided Education provided;Falls prevention discussed     Functional Status Survey: Is the patient deaf or have difficulty hearing?: No Does the patient have difficulty seeing, even when wearing glasses/contacts?: No Does the patient have difficulty concentrating, remembering, or making decisions?: No Does the patient have difficulty walking or climbing stairs?: No Does the patient have difficulty dressing or bathing?: No Does the patient have difficulty doing errands alone such as visiting a doctor's office or shopping?: No   Assessment & Plan:    CPE completed today  Prostate cancer screening and PSA options (with potential risks and benefits of testing vs not testing) were discussed along with recent recs/guidelines, shared decision making and handout/information given to pt today  USPSTF grade  A and B recommendations reviewed with patient; age-appropriate recommendations, preventive care, screening tests, etc discussed and encouraged; healthy living encouraged; see AVS for patient education given to patient  Discussed importance of 150 minutes of physical activity weekly, AHA exercise recommendations given to pt in AVS/handout  Discussed importance of healthy diet:  eating lean meats and proteins, avoiding trans fats and saturated fats, avoid simple sugars and excessive carbs in diet, eat 6 servings of fruit/vegetables daily and drink plenty of water and avoid sweet beverages.  DASH diet reviewed if pt has HTN  Recommended pt to do annual eye exam and routine dental exams/cleanings  Advance Care planning information and packet discussed and offered today, encouraged pt to discuss with family members/spouse/partner/friends and complete Advanced directive packet and bring copy to office   Reviewed Health Maintenance: Health Maintenance  Topic Date Due   COVID-19 Vaccine (5 - 2023-24 season) 02/22/2023  (Originally 06/20/2022)   INFLUENZA VACCINE  05/21/2023   PAP SMEAR-Modifier  09/21/2023   DTaP/Tdap/Td (2 - Td or Tdap) 01/16/2026   Hepatitis C Screening  Completed   HIV Screening  Completed   HPV VACCINES  Aged Out    Immunizations: Immunization History  Administered Date(s) Administered   Influenza,inj,Quad PF,6+ Mos 09/27/2015, 08/05/2019, 07/16/2020   Influenza,inj,quad, With Preservative 07/21/2019   PFIZER(Purple Top)SARS-COV-2 Vaccination 11/15/2019, 12/06/2019, 07/05/2020, 03/01/2021   PNEUMOCOCCAL CONJUGATE-20 03/25/2022   Tdap 01/17/2016   Vaccines:  HPV: up to at age 98 , ask insurance if age between 72-45  Shingrix: 75-64 yo and ask insurance if covered when patient above 72 yo Pneumonia: *** educated and discussed with patient. Flu: *** educated and discussed with patient. COVID:       ICD-10-CM   1. Annual physical exam  Z00.00           Danelle Berry, PA-C 02/06/23 3:22 PM  [facility]

## 2023-02-06 NOTE — Progress Notes (Signed)
Patient: Sheri Malone, Female    DOB: 11/11/1985, 37 y.o.   MRN: [id number] Danelle Berry, PA-C Visit Date: 02/06/2023  Today's Provider: Danelle Berry, PA-C   Chief Complaint  Patient presents with   Annual Exam   Subjective:   Annual physical exam:  Sheri Malone is a 37 y.o. female who presents today for complete physical exam:  Exercise/Activity:  exercising more - treadmill Diet/nutrition:  sig diet changes,  Sleep: not sleeping well   SDOH Screenings   Food Insecurity: No Food Insecurity (02/06/2023)  Housing: Low Risk  (02/06/2023)  Transportation Needs: No Transportation Needs (02/06/2023)  Utilities: Not At Risk (02/06/2023)  Alcohol Screen: Low Risk  (02/06/2023)  Depression (PHQ2-9): Low Risk  (02/06/2023)  Financial Resource Strain: Low Risk  (02/06/2023)  Physical Activity: Sufficiently Active (02/06/2023)  Social Connections: Socially Integrated (02/06/2023)  Stress: No Stress Concern Present (02/06/2023)  Tobacco Use: Low Risk  (02/06/2023)     USPSTF grade A and B recommendations - reviewed and addressed today  Depression:  Phq 9 completed today by patient, was reviewed by me with patient in the room, score is  negative, pt feels good     02/06/2023    3:03 PM 09/19/2022    2:50 PM 04/01/2022    9:33 AM 02/04/2022    8:25 AM  PHQ 2/9 Scores  PHQ - 2 Score 0 0 0 0  PHQ- 9 Score 0 0 0 0      02/06/2023    3:03 PM 09/19/2022    2:50 PM 04/01/2022    9:33 AM 02/04/2022    8:25 AM 11/20/2021    4:04 PM  Depression screen PHQ 2/9  Decreased Interest 0 0 0 0 0  Down, Depressed, Hopeless 0 0 0 0 0  PHQ - 2 Score 0 0 0 0 0  Altered sleeping 0 0 0 0   Tired, decreased energy 0 0 0 0   Change in appetite 0 0 0 0   Feeling bad or failure about yourself  0 0 0 0   Trouble concentrating 0 0 0 0   Moving slowly or fidgety/restless 0 0 0 0   Suicidal thoughts 0 0 0 0   PHQ-9 Score 0 0 0 0   Difficult doing work/chores Not difficult at all Not difficult at all Not  difficult at all Not difficult at all     Alcohol screening: Flowsheet Row Office Visit from 02/06/2023 in [facility]  AUDIT-C Score 1       Immunizations and Health Maintenance: Health Maintenance  Topic Date Due   COVID-19 Vaccine (5 - 2023-24 season) 02/22/2023 (Originally 06/20/2022)   INFLUENZA VACCINE  05/21/2023   PAP SMEAR-Modifier  09/21/2023   DTaP/Tdap/Td (2 - Td or Tdap) 01/16/2026   Hepatitis C Screening  Completed   HIV Screening  Completed   HPV VACCINES  Aged Out     Hep C Screening: done previously  STD testing and prevention (HIV/chl/gon/syphilis):  see above, no additional testing desired by pt today  Intimate partner violence: denies, feels safe   Sexual History/Pain during Intercourse: Married, no concerns  Menstrual History/LMP/Abnormal Bleeding: more irregular needs refill on OCP Taking for 3 weeks then taking a week break, first time in a while having abnormal bleeing, or heavy bleeding No LMP recorded. (Menstrual status: Irregular Periods).  Incontinence Symptoms:  urgency and weak pelvic floor for her whole life - slightly worse  Breast cancer: due  next birthday  Last Mammogram: *see HM list above BRCA gene screening: believe it was negative  Mom with breast CA in early 50's  Cervical cancer screening:  Pt  family hx of cancers - , ovarian, uterine, colon:     Osteoporosis:   Discussion on osteoporosis per age, including high calcium and vitamin D supplementation, weight bearing exercises On supplements per bariatric  Skin cancer:  Hx of skin CA -  NO Discussed atypical lesions   Colorectal cancer:   Colonoscopy is not due  Discussed concerning signs and sx of CRC, pt denies  Constipation a lot of changes with surgery   Lung cancer:   Low Dose CT Chest recommended if Age 35-80 years, 20 pack-year currently smoking OR have quit w/in 15years. Patient does not qualify.    Social History   Tobacco Use    Smoking status: Never   Smokeless tobacco: Never  Vaping Use   Vaping Use: Never used  Substance Use Topics   Alcohol use: Yes    Comment: occasionally/once a month   Drug use: No     Flowsheet Row Office Visit from 02/06/2023 in [facility]  AUDIT-C Score 1       Family History  Problem Relation Age of Onset   Migraines Mother    Hypertension Mother    Breast cancer Mother    Diabetes Father    Hypertension Father    Multiple sclerosis Sister    Cancer Maternal Grandfather        prostate   Heart disease Paternal Grandfather    Cancer Maternal Grandmother    Stomach cancer Neg Hx    Rectal cancer Neg Hx    Esophageal cancer Neg Hx    Colon cancer Neg Hx      Blood pressure/Hypertension: BP Readings from Last 3 Encounters:  02/06/23 120/84  12/01/22 128/85  09/19/22 136/78    Weight/Obesity: Wt Readings from Last 3 Encounters:  02/06/23 167 lb 9.6 oz (76 kg)  01/01/23 170 lb 9.6 oz (77.4 kg)  09/19/22 187 lb (84.8 kg)   BMI Readings from Last 3 Encounters:  02/06/23 29.69 kg/m  01/01/23 31.20 kg/m  09/19/22 33.13 kg/m     Lipids:  Lab Results  Component Value Date   CHOL 135 05/08/2022   CHOL 138 01/27/2014   Lab Results  Component Value Date   HDL 48 (L) 05/08/2022   HDL 42 01/27/2014   Lab Results  Component Value Date   LDLCALC 66 05/08/2022   LDLCALC 58 01/27/2014   Lab Results  Component Value Date   TRIG 120 05/08/2022   TRIG 188 (A) 01/27/2014   Lab Results  Component Value Date   CHOLHDL 2.8 05/08/2022   No results found for: "LDLDIRECT" Based on the results of lipid panel his/her cardiovascular risk factor ( using Poole Cohort )  in the next 10 years is: The ASCVD Risk score (Arnett DK, et al., 2019) failed to calculate for the following reasons:   The 2019 ASCVD risk score is only valid for ages 78 to 55  Glucose:  Glucose, Bld  Date Value Ref Range Status  09/19/2022 81 65 - 99 mg/dL Final     Comment:    .            Fasting reference interval .   07/08/2022 154 (H) 70 - 99 mg/dL Final    Comment:    Glucose reference range applies only to samples taken after  fasting for at least 8 hours.  06/26/2022 87 70 - 99 mg/dL Final    Comment:    Glucose reference range applies only to samples taken after fasting for at least 8 hours.    Advanced Care Planning:  A voluntary discussion about advance care planning including the explanation and discussion of advance directives.   Discussed health care proxy and Living will, and the patient was able to identify a health care proxy as husband.     Social History       Social History   Socioeconomic History   Marital status: Married    Spouse name: Antionette Char   Number of children: 2   Years of education: Not on file   Highest education level: Master's degree (e.g., MA, MS, MEng, MEd, MSW, MBA)  Occupational History   Not on file  Tobacco Use   Smoking status: Never   Smokeless tobacco: Never  Vaping Use   Vaping Use: Never used  Substance and Sexual Activity   Alcohol use: Yes    Comment: occasionally/once a month   Drug use: No   Sexual activity: Yes    Partners: Male    Birth control/protection: Pill  Other Topics Concern   Not on file  Social History Narrative   Lives at home with husband and daughter and son.   Right handed   Social Determinants of Health   Financial Resource Strain: Low Risk  (02/06/2023)   Overall Financial Resource Strain (CARDIA)    Difficulty of Paying Living Expenses: Not hard at all  Food Insecurity: No Food Insecurity (02/06/2023)   Hunger Vital Sign    Worried About Running Out of Food in the Last Year: Never true    Ran Out of Food in the Last Year: Never true  Transportation Needs: No Transportation Needs (02/06/2023)   PRAPARE - Administrator, Civil Service (Medical): No    Lack of Transportation (Non-Medical): No  Physical Activity: Sufficiently Active (02/06/2023)    Exercise Vital Sign    Days of Exercise per Week: 5 days    Minutes of Exercise per Session: 60 min  Stress: No Stress Concern Present (02/06/2023)   [employer] of Occupational Health - Occupational Stress Questionnaire    Feeling of Stress : Only a little  Social Connections: Socially Integrated (02/06/2023)   Social Connection and Isolation Panel [NHANES]    Frequency of Communication with Friends and Family: More than three times a week    Frequency of Social Gatherings with Friends and Family: More than three times a week    Attends Religious Services: More than 4 times per year    Active Member of [employer] or Organizations: Yes    Attends Engineer, structural: More than 4 times per year    Marital Status: Married    Family History        Family History  Problem Relation Age of Onset   Migraines Mother    Hypertension Mother    Breast cancer Mother    Diabetes Father    Hypertension Father    Multiple sclerosis Sister    Cancer Maternal Grandfather        prostate   Heart disease Paternal Grandfather    Cancer Maternal Grandmother    Stomach cancer Neg Hx    Rectal cancer Neg Hx    Esophageal cancer Neg Hx    Colon cancer Neg Hx     Patient Active Problem List   Diagnosis  Date Noted   S/P gastric bypass 07/07/2022   Left lower lobe pneumonia 03/30/2022   SIRS, possible sepsis (systemic inflammatory response syndrome) (HCC) 03/30/2022   S/P coil embolization of splenic artery aneurysm 03/25/22 03/30/2022   Vertigo 11/24/2021   BPPV (benign paroxysmal positional vertigo), left 11/15/2021   Splenic artery aneurysm 08/06/2021   Effusion of right ankle 07/31/2021   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   Iron deficiency anemia 11/15/2020   Slow transit constipation 11/15/2020   Microcytosis 11/15/2020   Arthralgia of right ankle 08/15/2020   Stiffness of right ankle joint 08/15/2020   Closed trimalleolar fracture of right ankle 05/21/2020    Gastro-esophageal reflux disease without esophagitis 04/13/2020   Obesity, Class III, BMI 40-49.9 (morbid obesity) 01/20/2020   Vitamin D deficiency 01/20/2020   Prediabetes 01/20/2020   Insulin resistance 01/20/2020   Metabolic syndrome 01/20/2020   Allergic rhinitis 11/23/2016   Allergic conjunctivitis 01/17/2016   Mild intermittent asthma 01/17/2016   Dermatitis, eczematoid 06/21/2015   H/O cesarean section 06/21/2015   History of diabetes mellitus arising in pregnancy 06/21/2015   Headache, migraine 06/21/2015    Past Surgical History:  Procedure Laterality Date   ANKLE FRACTURE SURGERY Right    x2   CESAREAN SECTION N/A 05/20/2013   Procedure: CESAREAN SECTION;  Surgeon: Genia Del, MD;  Location: [facility] ORS;  Service: Obstetrics;  Laterality: N/A;  primary   CESAREAN SECTION N/A 02/04/2018   Procedure: Repeat CESAREAN SECTION;  Surgeon: Noland Fordyce, MD;  Location: [facility] BIRTHING SUIT[facility];  Service: Obstetrics;  Laterality: N/A;  EDD: 02/16/18   CHOLECYSTECTOMY N/A 09/28/2018   Procedure: LAPAROSCOPIC CHOLECYSTECTOMY;  Surgeon: Abigail Miyamoto, MD;  Location: [facility] OR;  Service: General;  Laterality: N/A;   DILATION AND CURETTAGE OF UTERUS  10/21/2007   GASTRIC ROUX-EN-Y N/A 07/07/2022   Procedure: LAPAROSCOPIC ROUX-EN-Y GASTRIC BYPASS WITH UPPER ENDOSCOPY;  Surgeon: Gaynelle Adu, MD;  Location: [facility] ORS;  Service: General;  Laterality: N/A;   HIATAL HERNIA REPAIR N/A 07/07/2022   Procedure: HERNIA REPAIR HIATAL;  Surgeon: Gaynelle Adu, MD;  Location: [facility] OR[facility];  Service: General;  Laterality: N/A;   SALPINGOOPHORECTOMY  10/21/2007   left   UNILATERAL SALPINGECTOMY Right 02/04/2018   Procedure: UNILATERAL SALPINGECTOMY;  Surgeon: Noland Fordyce, MD;  Location: [facility] BIRTHING SUITES;  Service: Obstetrics;  Laterality: Right;   VISCERAL ARTERY INTERVENTION N/A 03/25/2022   Procedure: VISCERAL ARTERY INTERVENTION;  Surgeon: Renford Dills, MD;  Location: [facility] INVASIVE CV LAB;   Service: Cardiovascular;  Laterality: N/A;   WISDOM TOOTH EXTRACTION       Current Outpatient Medications:    albuterol (PROVENTIL HFA;VENTOLIN HFA) 108 (90 Base) MCG/ACT inhaler, Inhale 1 puff into the lungs every 6 (six) hours as needed for wheezing or shortness of breath., Disp: , Rfl:    azelastine (ASTELIN) 0.1 % nasal spray, Place 1 spray into both nostrils daily., Disp: , Rfl:    azelastine (OPTIVAR) 0.05 % ophthalmic solution, Place 1 drop into both eyes daily as needed (Allergies)., Disp: , Rfl:    EPINEPHrine 0.3 mg/0.3 mL IJ SOAJ injection, Inject 0.3 mg into the muscle as needed for anaphylaxis., Disp: , Rfl:    fluticasone (FLONASE) 50 MCG/ACT nasal spray, Place 1 spray into the nose daily as needed for allergies., Disp: , Rfl:    ipratropium-albuterol (DUONEB) 0.5-2.5 (3) MG/3ML SOLN, Take 3 mLs by nebulization every 6 (six) hours as needed (for wheeze, coughing spells, chest tightness, SOB)., Disp: 180 mL, Rfl: 1   levocetirizine (XYZAL)  5 MG tablet, Take 5 mg by mouth every evening., Disp: , Rfl:    meclizine (ANTIVERT) 25 MG tablet, Take 12.5-25 mg by mouth 3 (three) times daily as needed for dizziness., Disp: , Rfl:    montelukast (SINGULAIR) 10 MG tablet, Take 10 mg by mouth at bedtime., Disp: , Rfl:    Norethindrone Acetate-Ethinyl Estrad-FE (JUNEL FE 24) 1-20 MG-MCG(24) tablet, Take 1 tablet by mouth daily., Disp: , Rfl:    ondansetron (ZOFRAN) 4 MG tablet, Take 1 tablet (4 mg total) by mouth every 8 (eight) hours as needed for nausea or vomiting., Disp: 20 tablet, Rfl: 0   pantoprazole (PROTONIX) 40 MG tablet, TAKE 1 TABLET (40 MG TOTAL) BY MOUTH IN THE MORNING, Disp: 90 tablet, Rfl: 2   SUMAtriptan (IMITREX) 100 MG tablet, Take 1 tablet (100 mg total) by mouth once as needed for up to 2 doses. May repeat in 2 hours if headache persists or recurs., Disp: 10 tablet, Rfl: 12   SYMBICORT 160-4.5 MCG/ACT inhaler, INHALE 2 PUFFS INTO THE LUNGS IN THE MORNING AND AT BEDTIME., Disp:  10.2 each, Rfl: 1   benzonatate (TESSALON) 100 MG capsule, Take 1 capsule (100 mg total) by mouth every 8 (eight) hours., Disp: 21 capsule, Rfl: 0   budesonide (PULMICORT) 0.5 MG/2ML nebulizer solution, Take 2 mLs (0.5 mg total) by nebulization in the morning and at bedtime for 14 days. (Patient taking differently: Take 0.5 mg by nebulization daily as needed (Shortness of breath).), Disp: 56 mL, Rfl: 0   predniSONE (DELTASONE) 20 MG tablet, Take 2 tablets (40 mg total) by mouth daily with breakfast., Disp: 10 tablet, Rfl: 0   promethazine-dextromethorphan (PROMETHAZINE-DM) 6.25-15 MG/5ML syrup, Take 5 mLs by mouth at bedtime as needed for cough., Disp: 118 mL, Rfl: 0  Allergies  Allergen Reactions   Dust Mite Mixed Allergen Ext [Mite (D. Farinae)] Anaphylaxis   Other Anaphylaxis and Other (See Comments)    DUST ROACH   Shellfish Allergy     Patient Care Team: Danelle Berry, PA-C as PCP - General (Family Medicine) Debbe Odea, MD as PCP - Cardiology (Cardiology) Sidney Ace, MD as Referring Physician (Allergy) Danelle Berry, PA-C (Family Medicine)   Chart Review: I personally reviewed active problem list, medication list, allergies, family history, social history, health maintenance, notes from last encounter, lab results, imaging with the patient/caregiver today.   Review of Systems  Constitutional: Negative.   HENT: Negative.    Eyes: Negative.   Respiratory: Negative.    Cardiovascular: Negative.   Gastrointestinal: Negative.   Endocrine: Negative.   Genitourinary: Negative.   Musculoskeletal: Negative.   Skin: Negative.   Allergic/Immunologic: Negative.   Neurological: Negative.   Hematological: Negative.   Psychiatric/Behavioral: Negative.    All other systems reviewed and are negative.         Objective:   Vitals:  Vitals:   02/06/23 1506  BP: 120/84  Pulse: 99  Resp: 16  Temp: (!) 97.5 F (36.4 C)  TempSrc: Oral  SpO2: 100%  Weight: 167 lb 9.6  oz (76 kg)  Height: 5\' 3"  (1.6 m)    Body mass index is 29.69 kg/m.  Physical Exam Vitals and nursing note reviewed.  Constitutional:      General: She is not in acute distress.    Appearance: Normal appearance. She is well-developed and normal weight. She is not ill-appearing, toxic-appearing or diaphoretic.  HENT:     Head: Normocephalic and atraumatic.     Right Ear: Tympanic membrane,  ear canal and external ear normal. There is no impacted cerumen.     Left Ear: Tympanic membrane, ear canal and external ear normal. There is no impacted cerumen.     Nose: Nose normal. No congestion or rhinorrhea.     Right Turbinates: Enlarged and pale.     Left Turbinates: Enlarged and pale.     Mouth/Throat:     Mouth: Mucous membranes are moist.     Pharynx: Oropharynx is clear. Uvula midline. No oropharyngeal exudate or posterior oropharyngeal erythema.  Eyes:     General: Lids are normal. No scleral icterus.       Right eye: No discharge.        Left eye: No discharge.     Conjunctiva/sclera: Conjunctivae normal.  Neck:     Trachea: Phonation normal. No tracheal deviation.  Cardiovascular:     Rate and Rhythm: Normal rate and regular rhythm.     Pulses: Normal pulses.          Radial pulses are 2+ on the right side and 2+ on the left side.       Posterior tibial pulses are 2+ on the right side and 2+ on the left side.     Heart sounds: Normal heart sounds. No murmur heard.    No friction rub. No gallop.  Pulmonary:     Effort: Pulmonary effort is normal. No respiratory distress.     Breath sounds: Normal breath sounds. No stridor. No wheezing, rhonchi or rales.  Chest:     Chest wall: No tenderness.  Abdominal:     General: Bowel sounds are normal. There is no distension.     Palpations: Abdomen is soft.     Tenderness: There is no abdominal tenderness. There is no guarding or rebound.  Musculoskeletal:        General: No deformity.     Cervical back: Normal range of motion and  neck supple.     Right lower leg: No edema.     Left lower leg: No edema.  Lymphadenopathy:     Cervical: No cervical adenopathy.  Skin:    General: Skin is warm and dry.     Capillary Refill: Capillary refill takes less than 2 seconds.     Coloration: Skin is not pale.     Findings: No rash.  Neurological:     Mental Status: She is alert. Mental status is at baseline.     Motor: No abnormal muscle tone.     Gait: Gait normal.  Psychiatric:        Mood and Affect: Mood normal.        Speech: Speech normal.        Behavior: Behavior normal.       Fall Risk:    02/06/2023    3:03 PM 09/19/2022    2:50 PM 04/01/2022    9:33 AM 02/04/2022    8:25 AM 11/20/2021    4:04 PM  Fall Risk   Falls in the past year? 0 0 0 1 0  Number falls in past yr: 0 0 0 0   Injury with Fall? 0 0 0 1   Risk for fall due to : No Fall Risks No Fall Risks No Fall Risks Impaired balance/gait   Follow up Falls prevention discussed;Falls evaluation completed;Education provided Falls prevention discussed;Education provided;Falls evaluation completed Falls prevention discussed;Education provided Education provided;Falls prevention discussed     Functional Status Survey: Is the patient deaf or have difficulty hearing?:  No Does the patient have difficulty seeing, even when wearing glasses/contacts?: No Does the patient have difficulty concentrating, remembering, or making decisions?: No Does the patient have difficulty walking or climbing stairs?: No Does the patient have difficulty dressing or bathing?: No Does the patient have difficulty doing errands alone such as visiting a doctor's office or shopping?: No   Assessment & Plan:    CPE completed today  USPSTF grade A and B recommendations reviewed with patient; age-appropriate recommendations, preventive care, screening tests, etc discussed and encouraged; healthy living encouraged; see AVS for patient education given to patient  Discussed importance of  150 minutes of physical activity weekly, AHA exercise recommendations given to pt in AVS/handout  Discussed importance of healthy diet:  eating lean meats and proteins, avoiding trans fats and saturated fats, avoid simple sugars and excessive carbs in diet, eat 6 servings of fruit/vegetables daily and drink plenty of water and avoid sweet beverages.    Recommended pt to do annual eye exam and routine dental exams/cleanings  Depression, alcohol, fall screening completed as documented above and per flowsheets  Advance Care planning information and packet discussed and offered today, encouraged pt to discuss with family members/spouse/partner/friends and complete Advanced directive packet and bring copy to office   Reviewed Health Maintenance: Health Maintenance  Topic Date Due   COVID-19 Vaccine (5 - 2023-24 season) 02/22/2023 (Originally 06/20/2022)   INFLUENZA VACCINE  05/21/2023   PAP SMEAR-Modifier  09/21/2023   DTaP/Tdap/Td (2 - Td or Tdap) 01/16/2026   Hepatitis C Screening  Completed   HIV Screening  Completed   HPV VACCINES  Aged Out    Immunizations: Immunization History  Administered Date(s) Administered   Influenza,inj,Quad PF,6+ Mos 09/27/2015, 08/05/2019, 07/16/2020   Influenza,inj,quad, With Preservative 07/21/2019   PFIZER(Purple Top)SARS-COV-2 Vaccination 11/15/2019, 12/06/2019, 07/05/2020, 03/01/2021   PNEUMOCOCCAL CONJUGATE-20 03/25/2022   Tdap 01/17/2016   Vaccines:  HPV: up to at age 55 , ask insurance if age between 61-45  Shingrix: 66-64 yo and ask insurance if covered when patient above 36 yo Pneumonia: done- educated and discussed with patient. Flu:  educated and discussed with patient. COVID:      ICD-10-CM   1. Annual physical exam  Z00.00 Lipid panel    TSH    Hemoglobin A1c    2. Prediabetes  R73.03 Hemoglobin A1c    3. Perimenopause  N95.1    sleep disturbances, hot flashes and sweats at night, mother and grandmother went through menopause in  their 33's    4. Menorrhagia with irregular cycle  N92.1 Norethindrone Acetate-Ethinyl Estrad-FE (JUNEL FE 24) 1-20 MG-MCG(24) tablet   on OCP, more irregular breath through - advised to take continuously or f/up GYN if concerned    5. Insomnia, unspecified type  G47.00    new, may be perimenopause related    6. Encounter for surveillance of contraceptive pills  Z30.41    taking pills for 3 weeks then off for a week - does not seem consistent with the RX, refills given, lengthy discussion today about meds/dosing and sx    7. Family history of breast cancer  Z80.3 MM 3D SCREENING MAMMOGRAM BILATERAL BREAST    8. Encounter for medication monitoring  Z51.81     9. Encounter for screening mammogram for malignant neoplasm of breast  Z12.31 MM 3D SCREENING MAMMOGRAM BILATERAL BREAST          Danelle Berry, PA-C 02/06/23 3:35 PM  [facility]

## 2023-02-07 LAB — LIPID PANEL
Cholesterol: 160 mg/dL (ref <200)
HDL: 55 mg/dL (ref >=50)
LDL Cholesterol (Calc): 80 mg/dL (calc)
Non-HDL Cholesterol (Calc): 105 mg/dL (calc) (ref <130)
Total CHOL/HDL Ratio: 2.9 (calc) (ref <5.0)
Triglycerides: 149 mg/dL (ref <150)

## 2023-02-07 LAB — TSH: TSH: 0.48 mIU/L

## 2023-02-07 LAB — HEMOGLOBIN A1C
Hgb A1c MFr Bld: 5.6 % of total Hgb (ref <5.7)
Mean Plasma Glucose: 114 mg/dL
eAG (mmol/L): 6.3 mmol/L

## 2023-02-11 ENCOUNTER — Encounter: Payer: Self-pay | Admitting: Family Medicine

## 2023-02-18 NOTE — Telephone Encounter (Signed)
Error

## 2023-02-27 ENCOUNTER — Encounter: Payer: Self-pay | Admitting: Family Medicine

## 2023-03-02 ENCOUNTER — Other Ambulatory Visit: Payer: Self-pay | Admitting: Family Medicine

## 2023-03-02 DIAGNOSIS — K219 Gastro-esophageal reflux disease without esophagitis: Secondary | ICD-10-CM

## 2023-03-05 IMAGING — CT CT HEART MORP W/ CTA COR W/ SCORE W/ CA W/CM &/OR W/O CM
1 of 13 series · 4 of 20 positions shown, 5 images · non-contrast
Comparison: None

Addendum:
CLINICAL DATA: Chest pain

EXAM:
Cardiac/Coronary  CTA
TECHNIQUE: The patient was scanned on a Siemens Somatom go.Top scanner.

[Series 26: multiphase % cta coronary 0.60 · axial · 0.33mm/px · z∈[-1099,-1010]mm · 4 of 4452 slices shown, 5 images]
[im 891/4452  vessel]
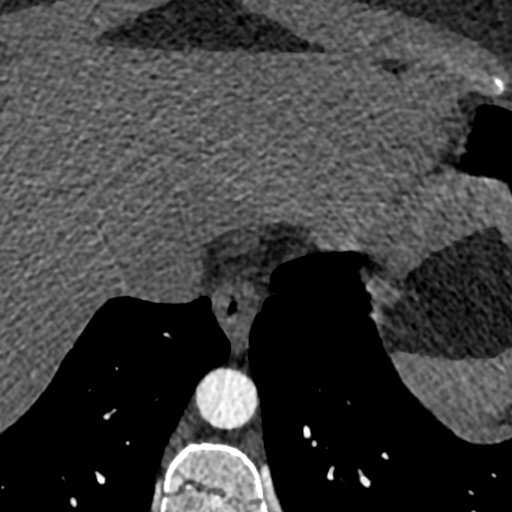
[im 891/4452  lung]
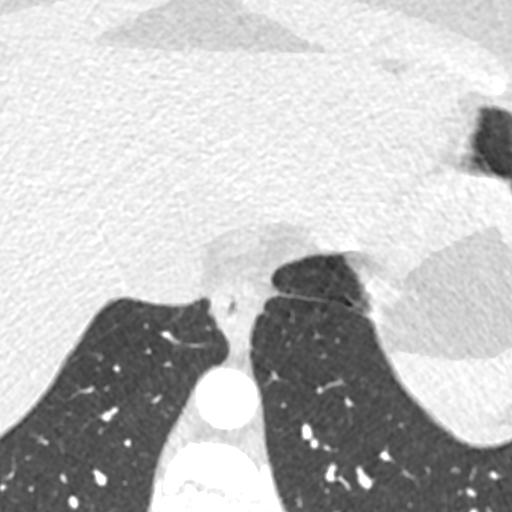
[im 1781/4452  vessel]
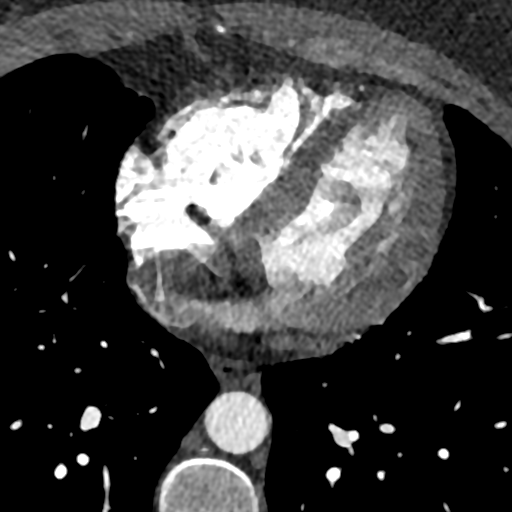
[im 2671/4452  vessel]
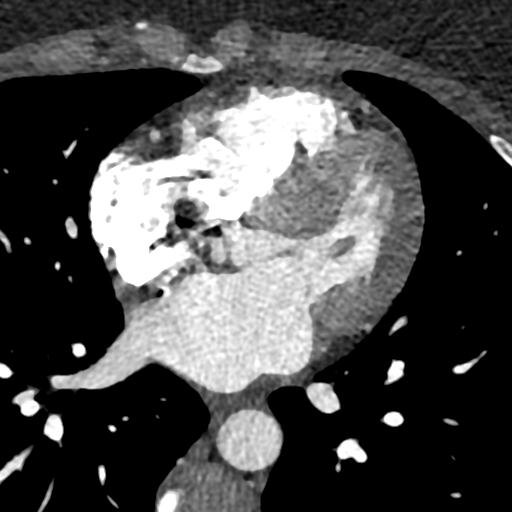
[im 3561/4452  vessel]
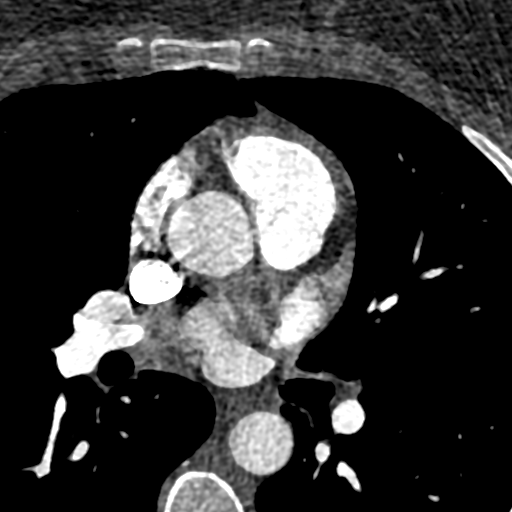

[4 of 20 positions shown; findings below may reference images not displayed]



Aortic Valve:  Trileaflet.  No calcifications.

Coronary Arteries:  Normal coronary origin.  Right dominance.

RCA is a dominant artery that gives rise to PDA and PLA. There is no
plaque.

Left main is a large artery that gives rise to LAD and LCX arteries.

LAD has no plaque.

LCX is a non-dominant artery that gives rise to one OM1 branch.
There is no plaque.

Other findings:

Normal pulmonary vein drainage into the left atrium.

Normal left atrial appendage without a thrombus.

Normal size of the pulmonary artery.
IMPRESSION: 1. Normal coronary calcium score of 0. Patient is low risk for
coronary events.

2. Normal coronary origin with right dominance.

3. No evidence of CAD.

4. CAD-RADS 0. Consider non-atherosclerotic causes of chest pain.

EXAM:
OVER-READ INTERPRETATION  CT CHEST

The following report is an over-read performed by radiologist Dr.
over-read does not include interpretation of cardiac or coronary
anatomy or pathology. The coronary calcium and coronary angiography
interpretation by the cardiologist is attached.
FINDINGS: Cardiovascular: See dedicated report for cardiovascular details.

Mediastinum/Nodes: No adenopathy in the chest, imaging is limited to
cardiac structures and mid chest only.

Lungs/Pleura: Minimal basilar atelectasis. No effusion. No
consolidative process. Visualized airways are patent.

Upper Abdomen: Incidental imaging of upper abdominal contents
without acute process though there may be a splenic artery aneurysm
as large as 14 x 10 mm, perhaps even larger in a woman of
child-bearing age (image 74/18)

Musculoskeletal: Unremarkable.
IMPRESSION: Possible splenic artery aneurysm at least 14 x 10 mm, likely larger
in a woman of child-bearing age. Suggest CT of the abdomen with
contrast for further evaluation. Comparison with prior imaging if
available could also be performed. No current imaging is noted of
the abdomen and pelvis within the system for review.

Otherwise, no significant extracardiac findings.

These results will be called to the ordering clinician or
representative by the Radiologist Assistant, and communication
documented in the PACS or [REDACTED].



Aortic Valve:  Trileaflet.  No calcifications.

Coronary Arteries:  Normal coronary origin.  Right dominance.

RCA is a dominant artery that gives rise to PDA and PLA. There is no
plaque.

Left main is a large artery that gives rise to LAD and LCX arteries.

LAD has no plaque.

LCX is a non-dominant artery that gives rise to one OM1 branch.
There is no plaque.

Other findings:

Normal pulmonary vein drainage into the left atrium.

Normal left atrial appendage without a thrombus.

Normal size of the pulmonary artery.
IMPRESSION: 1. Normal coronary calcium score of 0. Patient is low risk for
coronary events.

2. Normal coronary origin with right dominance.

3. No evidence of CAD.

4. CAD-RADS 0. Consider non-atherosclerotic causes of chest pain.

## 2023-03-06 DIAGNOSIS — M19171 Post-traumatic osteoarthritis, right ankle and foot: Secondary | ICD-10-CM | POA: Diagnosis not present

## 2023-03-06 DIAGNOSIS — M25571 Pain in right ankle and joints of right foot: Secondary | ICD-10-CM | POA: Diagnosis not present

## 2023-03-06 DIAGNOSIS — M25871 Other specified joint disorders, right ankle and foot: Secondary | ICD-10-CM | POA: Diagnosis not present

## 2023-03-21 ENCOUNTER — Other Ambulatory Visit: Payer: Self-pay | Admitting: Family Medicine

## 2023-03-23 NOTE — Telephone Encounter (Signed)
Unable to refill per protocol, Rx expired. Discontinued 03/30/22.  Requested Prescriptions  Pending Prescriptions Disp Refills   JUNEL 1/20 1-20 MG-MCG tablet [Pharmacy Med Name: JUNEL 1 MG-20 MCG TABLET] 21 tablet 15    Sig: TAKE 1 TABLET BY MOUTH EVERY DAY     OB/GYN:  Contraceptives Passed - 03/21/2023  1:12 AM      Passed - Last BP in normal range    BP Readings from Last 1 Encounters:  02/06/23 120/84         Passed - Valid encounter within last 12 months    Recent Outpatient Visits           1 month ago Annual physical exam   [facility] Danelle Berry, PA-C   6 months ago Irregular periods   [facility] Berniece Salines, FNP   11 months ago Encounter for examination following treatment at hospital   [facility] Danelle Berry, PA-C   1 year ago Annual physical exam   [facility] Danelle Berry, PA-C   1 year ago Class 2 severe obesity with serious comorbidity and body mass index (BMI) of 39.0 to 39.9 in adult, unspecified obesity type [facility])   [facility] Berniece Salines, [place]              Passed - Patient is not a smoker

## 2023-03-24 ENCOUNTER — Encounter: Payer: BC Managed Care – PPO | Attending: General Surgery | Admitting: Skilled Nursing Facility1

## 2023-03-24 ENCOUNTER — Encounter: Payer: Self-pay | Admitting: Skilled Nursing Facility1

## 2023-03-24 VITALS — Ht 63.0 in | Wt 165.3 lb

## 2023-03-24 NOTE — Progress Notes (Signed)
Bariatric Nutrition Follow-Up Visit Medical Nutrition Therapy   Surgery date: 07/07/2022 Surgery type: RYGB  NUTRITION ASSESSMENT   Anthropometrics  Start weight at [facility]: 226 lbs (date: 11/20/2021)  Height: 62 in Weight today: 165.3 pounds    Clinical  Medical hx: vertigo, nausea, GERD Medications: see list  Labs: alkaline phosphatase 33, LDL 103, A1C 6.0 Notable signs/symptoms: monthly migraines  Any previous deficiencies? No   Body Composition Scale 09/02/2022 01/01/2023 03/24/2023  Current Body Weight 189.1 170.6 165.3  Total Body Fat % 38.4 34.1 33  Visceral Fat 10 8 8   Fat-Free Mass % 61.5 65.8 66.9   Total Body Water % 45.2 47.4 47.9  Muscle-Mass lbs 30.3 31.0 30.9  BMI 34.4 29.8 28.9  Body Fat Displacement            Torso  lbs 44.9 35.9 33.7         Left Leg  lbs 8.9 7.1 6.7         Right Leg  lbs 8.9 7.1 6.7         Left Arm  lbs 4.4 3.5 3.3         Right Arm   lbs 4.4 3.5 3.3     Lifestyle & Dietary Hx  Pt states while the consistent of her cardio type fitness has reduced she is generally active with her kids. 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 So now can take her lunch to work instead of eating out.  Pt states she pays attention to eating until satisfaction not physically full.    Estimated daily fluid intake: 64+ oz Estimated daily protein intake: 60 g Supplements: multi and calcium  Current average weekly physical activity: walk 1-2 miles, 1-2 day a week.  24-Hr Dietary Recall First Meal 8:45-9:30am: 20 g pro yogurt + granola or protein shake Snack:  chips or popcorn or craisins or fruit or protein bar if the protein shake for breakfast  Second Meal: chips and hamburger patty on some bread  Snack:  nuts  Third Meal: chicken on the grill + corn + baked french fries  Snack: maybe nuts or half a protein shake Beverages: protein coffee + sweet n low coffee, water, gatorade zero, occasionally half and half  tea, zero calorie sprite (sip on it for a week)  Post-Op Goals/ Signs/ Symptoms Using straws: no Drinking while eating: no Chewing/swallowing difficulties: no Changes in vision: no Changes to mood/headaches: no Hair loss/changes to skin/nails: no Difficulty focusing/concentrating: no Sweating: no Limb weakness: on Dizziness/lightheadedness: no Palpitations: no  Carbonated/caffeinated beverages: no N/V/D/C/Gas: no Abdominal pain: no Dumping syndrome: no    NUTRITION DIAGNOSIS  Overweight/obesity ([address]-3.3) related to past poor dietary habits and physical inactivity as evidenced by completed bariatric surgery and following dietary guidelines for continued weight loss and healthy nutrition status.     NUTRITION INTERVENTION Nutrition counseling (C-1) and education (E-2) to facilitate bariatric surgery goals, including: The importance of consuming adequate calories as well as certain nutrients daily due to the body's need for essential vitamins, minerals, and fats The importance of daily physical activity and to reach a goal of at least 150 minutes of moderate to vigorous physical activity weekly (or as directed by their physician) due to benefits such as increased musculature and improved lab values The importance of intuitive eating specifically learning hunger-satiety cues and understanding the importance of learning a new body: The importance of mindful eating to avoid grazing behaviors  Encouraged patient to honor their body's internal hunger and fullness cues.  Throughout the day, check in mentally and rate hunger. Stop eating when satisfied not full regardless of how much food is left on the plate.  Get more if still hungry 20-30 minutes later.  The key is to honor satisfaction so throughout the meal, rate fullness factor and stop when comfortably satisfied not physically full. The key is to honor hunger and fullness without any feelings of guilt or shame.  Pay attention to what the  internal cues are, rather than any external factors. This will enhance the confidence you have in listening to your own body and following those internal cues enabling you to increase how often you eat when you are hungry not out of appetite and stop when you are satisfied not full.  Encouraged pt to continue to eat balanced meals inclusive of non starchy vegetables 2 times a day 7 days a week Encouraged pt to choose lean protein sources: limiting beef, pork, sausage, hotdogs, and lunch meat Encourage pt to choose healthy fats such as plant based limiting animal fats Encouraged pt to continue to drink a minium 64 fluid ounces with half being plain water to satisfy proper hydration   Goals -continue: increase physical activity, play basketball with daughter, walking or light jog -continue: purchase non-starchy vegetable snacks to have on hand at home  Handouts Previously Provided Include  6 Months to Maintenance Handout with meal and snack ideas.  Learning Style & Readiness for Change Teaching method utilized: Visual & Auditory  Demonstrated degree of understanding via: Teach Back  Readiness Level: contemplative Barriers to learning/adherence to lifestyle change: unidentified   RD's Notes for Next Visit Assess adherence to pt chosen goals    MONITORING & EVALUATION Dietary intake, weekly physical activity, body weight  Next Steps Patient is to follow-up in September

## 2023-04-03 ENCOUNTER — Ambulatory Visit: Payer: BC Managed Care – PPO | Admitting: Dietician

## 2023-04-27 ENCOUNTER — Ambulatory Visit
Admission: RE | Admit: 2023-04-27 | Discharge: 2023-04-27 | Disposition: A | Discharge status: Home / Self Care | Payer: BC Managed Care – PPO | Source: Ambulatory Visit | Attending: Family Medicine | Admitting: Family Medicine

## 2023-04-27 DIAGNOSIS — Z803 Family history of malignant neoplasm of breast: Secondary | ICD-10-CM

## 2023-04-27 DIAGNOSIS — Z1231 Encounter for screening mammogram for malignant neoplasm of breast: Secondary | ICD-10-CM

## 2023-04-28 ENCOUNTER — Other Ambulatory Visit (INDEPENDENT_AMBULATORY_CARE_PROVIDER_SITE_OTHER): Payer: Self-pay | Admitting: Nurse Practitioner

## 2023-04-28 DIAGNOSIS — I728 Aneurysm of other specified arteries: Secondary | ICD-10-CM

## 2023-05-11 ENCOUNTER — Ambulatory Visit (INDEPENDENT_AMBULATORY_CARE_PROVIDER_SITE_OTHER): Payer: BC Managed Care – PPO

## 2023-05-11 ENCOUNTER — Ambulatory Visit (INDEPENDENT_AMBULATORY_CARE_PROVIDER_SITE_OTHER): Payer: BC Managed Care – PPO | Admitting: Vascular Surgery

## 2023-05-11 ENCOUNTER — Encounter (INDEPENDENT_AMBULATORY_CARE_PROVIDER_SITE_OTHER): Payer: Self-pay | Admitting: Vascular Surgery

## 2023-05-11 VITALS — BP 106/74 | HR 77 | Resp 16 | Wt 165.4 lb

## 2023-05-11 DIAGNOSIS — K219 Gastro-esophageal reflux disease without esophagitis: Secondary | ICD-10-CM

## 2023-05-11 DIAGNOSIS — K551 Chronic vascular disorders of intestine: Secondary | ICD-10-CM

## 2023-05-11 DIAGNOSIS — I728 Aneurysm of other specified arteries: Secondary | ICD-10-CM

## 2023-05-11 DIAGNOSIS — J4520 Mild intermittent asthma, uncomplicated: Secondary | ICD-10-CM

## 2023-05-11 NOTE — Progress Notes (Signed)
MRN : [id number]  Sheri Malone is a 37 y.o. (1986-10-15) female who presents with chief complaint of check circulation.  History of Present Illness:   The patient returns to the office for followup and review status post angiogram with intervention on 03/25/2022.    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   There are no recent neurological changes noted. No documented history of DVT, PE or superficial thrombophlebitis. The patient denies recent episodes of angina.     Noninvasive study showed no evidence of splenic artery aneurysm.  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  No outpatient medications have been marked as taking for the 05/11/23 encounter (Appointment) with Gilda Crease, Latina Craver, MD.    Past Medical History:  Diagnosis Date   Abnormal Pap smear    Arthritis    ankle   Asthma    GERD (gastroesophageal reflux disease)    Gestational HTN    Headache(784.0)    History of ectopic pregnancy    History of gestational diabetes    Low hemoglobin    on ironspan   Pneumonia    Rhinitis    Vaginal Pap smear, abnormal     Past Surgical History:  Procedure Laterality Date   ANKLE FRACTURE SURGERY Right    x2   CESAREAN SECTION N/A 05/20/2013   Procedure: CESAREAN SECTION;  Surgeon: Genia Del, MD;  Location: [facility];  Service: Obstetrics;  Laterality: N/A;  primary   CESAREAN SECTION N/A 02/04/2018   Procedure: Repeat CESAREAN SECTION;  Surgeon: Noland Fordyce, MD;  Location: [facility] BIRTHING SUITES;  Service: Obstetrics;  Laterality: N/A;  EDD: 02/16/18   CHOLECYSTECTOMY N/A 09/28/2018   Procedure: LAPAROSCOPIC CHOLECYSTECTOMY;  Surgeon: Abigail Miyamoto, MD;  Location: [facility] OR;  Service: General;   Laterality: N/A;   DILATION AND CURETTAGE OF UTERUS  10/21/2007   GASTRIC ROUX-EN-Y N/A 07/07/2022   Procedure: LAPAROSCOPIC ROUX-EN-Y GASTRIC BYPASS WITH UPPER ENDOSCOPY;  Surgeon: Gaynelle Adu, MD;  Location: [facility] ORS;  Service: General;  Laterality: N/A;   HIATAL HERNIA REPAIR N/A 07/07/2022   Procedure: HERNIA REPAIR HIATAL;  Surgeon: Gaynelle Adu, MD;  Location: [facility];  Service: General;  Laterality: N/A;   SALPINGOOPHORECTOMY  10/21/2007   left   UNILATERAL SALPINGECTOMY Right 02/04/2018   Procedure: UNILATERAL SALPINGECTOMY;  Surgeon: Noland Fordyce, MD;  Location: [facility] BIRTHING SUITES;  Service: Obstetrics;  Laterality: Right;   VISCERAL ARTERY INTERVENTION N/A 03/25/2022   Procedure: VISCERAL ARTERY INTERVENTION;  Surgeon: Renford Dills, MD;  Location: [facility] INVASIVE CV LAB;  Service: Cardiovascular;  Laterality: N/A;   WISDOM TOOTH EXTRACTION      Social History Social History   Tobacco Use   Smoking status: Never   Smokeless tobacco: Never  Vaping Use   Vaping status: Never Used  Substance Use Topics   Alcohol use: Yes    Comment: occasionally/once a month   Drug use: No  Family History Family History  Problem Relation Age of Onset   Migraines Mother    Hypertension Mother    Breast cancer Mother 90   Stroke Father    Diabetes Father    Hypertension Father    Multiple sclerosis Sister    Cancer Maternal Grandmother    Cancer Maternal Grandfather        prostate   Heart disease Paternal Grandfather    Stomach cancer Neg Hx    Rectal cancer Neg Hx    Esophageal cancer Neg Hx    Colon cancer Neg Hx     Allergies  Allergen Reactions   Dust Mite Mixed Allergen Ext [Mite (D. Farinae)] Anaphylaxis   Other Anaphylaxis and Other (See Comments)    DUST ROACH   Shellfish Allergy      REVIEW OF SYSTEMS (Negative unless checked)  Constitutional: [] Weight loss  [] Fever  [] Chills Cardiac: [] Chest pain   [] Chest pressure   [] Palpitations   [] Shortness of  breath when laying flat   [] Shortness of breath with exertion. Vascular:  [x] Pain in legs with walking   [] Pain in legs at rest  [] History of DVT   [] Phlebitis   [] Swelling in legs   [] Varicose veins   [] Non-healing ulcers Pulmonary:   [] Uses home oxygen   [] Productive cough   [] Hemoptysis   [] Wheeze  [] COPD   [x] Asthma Neurologic:  [] Dizziness   [] Seizures   [] History of stroke   [] History of TIA  [] Aphasia   [] Vissual changes   [] Weakness or numbness in arm   [] Weakness or numbness in leg Musculoskeletal:   [] Joint swelling   [x] Joint pain   [] Low back pain Hematologic:  [] Easy bruising  [] Easy bleeding   [] Hypercoagulable state   [] Anemic Gastrointestinal:  [] Diarrhea   [] Vomiting  [x] Gastroesophageal reflux/heartburn   [] Difficulty swallowing. Genitourinary:  [] Chronic kidney disease   [] Difficult urination  [] Frequent urination   [] Blood in urine Skin:  [] Rashes   [] Ulcers  Psychological:  [] History of anxiety   []  History of major depression.  Physical Examination  There were no vitals filed for this visit. There is no height or weight on file to calculate BMI. Gen: WD/WN, NAD Head: [place]/AT, No temporalis wasting.  Ear/Nose/Throat: Hearing grossly intact, nares w/o erythema or drainage Eyes: PER, EOMI, sclera nonicteric.  Neck: Supple, no masses.  No bruit or JVD.  Pulmonary:  Good air movement, no audible wheezing, no use of accessory muscles.  Cardiac: RRR, normal S1, S2, no Murmurs. Vascular:  mild trophic changes, no open wounds Vessel Right Left  Radial Palpable Palpable  Gastrointestinal: soft, non-distended. No guarding/no peritoneal signs.  Musculoskeletal: M/S 5/5 throughout.  No visible deformity.  Neurologic: CN 2-12 intact. Pain and light touch intact in extremities.  Symmetrical.  Speech is fluent. Motor exam as listed above. Psychiatric: Judgment intact, Mood & affect appropriate for pt's clinical situation. Dermatologic: No rashes or ulcers noted.  No changes  consistent with cellulitis.   CBC Lab Results  Component Value Date   WBC 9.6 09/19/2022   HGB 11.7 09/19/2022   HCT 36.0 09/19/2022   MCV 81.6 09/19/2022   PLT 370 09/19/2022    BMET    Component Value Date/Time   NA 139 09/19/2022 1514   NA 139 07/19/2021 0951   K 4.0 09/19/2022 1514   CL 103 09/19/2022 1514   CO2 25 09/19/2022 1514   GLUCOSE 81 09/19/2022 1514   BUN 8 09/19/2022 1514   BUN 10 07/19/2021 0951  CREATININE 0.83 09/19/2022 1514   CALCIUM 9.4 09/19/2022 1514   GFRNONAA >60 07/08/2022 0436   GFRNONAA 104 11/15/2020 0922   GFRAA 121 11/15/2020 0922   CrCl cannot be calculated (Patient's most recent lab result is older than the maximum 21 days allowed.).  COAG Lab Results  Component Value Date   INR 1.3 (H) 03/30/2022    Radiology MM 3D SCREENING MAMMOGRAM BILATERAL BREAST  Result Date: 04/27/2023 CLINICAL DATA:  Screening. EXAM: DIGITAL SCREENING BILATERAL MAMMOGRAM WITH TOMOSYNTHESIS AND CAD TECHNIQUE: Bilateral screening digital craniocaudal and mediolateral oblique mammograms were obtained. Bilateral screening digital breast tomosynthesis was performed. The images were evaluated with computer-aided detection. COMPARISON:  None available. ACR Breast Density Category b: There are scattered areas of fibroglandular density. FINDINGS: There are no findings suspicious for malignancy. IMPRESSION: No mammographic evidence of malignancy. A result letter of this screening mammogram will be mailed directly to the patient. RECOMMENDATION: Screening mammogram in one year. (Code:SM-B-01Y) BI-RADS CATEGORY  1: Negative. Electronically Signed   By: Norva Pavlov M.D.   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  Given the findings on ultrasound today I will further investigate the situation with a CT angiogram. - CT Angio Abd/Pel w/ and/or w/o;  Future  2. 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  Her post procedure course has been complication by some pulmonary issues but she does that she no longer continues to have any issues with that.  This is not an unusual sequela of embolization.  Review of the CT scan from the ER visit shows roughly half the spleen is still well perfused which is excellent.   3. Mild intermittent asthma without complication Continue pulmonary medications and aerosols as already ordered, these medications have been reviewed and there are no changes at this time.   4. Gastro-esophageal reflux disease without esophagitis Continue PPI as already ordered, this medication has been reviewed and there are no changes at this time.  Avoidence of caffeine and alcohol  Moderate elevation of the head of the bed     Levora Dredge, MD  05/11/2023 8:34 AM

## 2023-05-18 ENCOUNTER — Ambulatory Visit
Admission: RE | Admit: 2023-05-18 | Discharge: 2023-05-18 | Disposition: A | Discharge status: Home / Self Care | Payer: BC Managed Care – PPO | Source: Ambulatory Visit | Attending: Vascular Surgery | Admitting: Vascular Surgery

## 2023-05-18 DIAGNOSIS — K551 Chronic vascular disorders of intestine: Secondary | ICD-10-CM

## 2023-05-18 DIAGNOSIS — R109 Unspecified abdominal pain: Secondary | ICD-10-CM | POA: Diagnosis not present

## 2023-05-18 DIAGNOSIS — D735 Infarction of spleen: Secondary | ICD-10-CM | POA: Diagnosis not present

## 2023-05-18 DIAGNOSIS — I728 Aneurysm of other specified arteries: Secondary | ICD-10-CM | POA: Diagnosis not present

## 2023-05-18 MED ORDER — IOHEXOL 350 MG/ML SOLN
100.0000 mL | Freq: Once | INTRAVENOUS | Status: AC | PRN
  Administered 2023-05-18: 100 mL via INTRAVENOUS

## 2023-05-21 ENCOUNTER — Ambulatory Visit (INDEPENDENT_AMBULATORY_CARE_PROVIDER_SITE_OTHER): Payer: BC Managed Care – PPO | Admitting: Vascular Surgery

## 2023-05-25 ENCOUNTER — Encounter (INDEPENDENT_AMBULATORY_CARE_PROVIDER_SITE_OTHER): Payer: Self-pay | Admitting: Vascular Surgery

## 2023-05-25 ENCOUNTER — Ambulatory Visit (INDEPENDENT_AMBULATORY_CARE_PROVIDER_SITE_OTHER): Payer: BC Managed Care – PPO | Admitting: Vascular Surgery

## 2023-05-25 VITALS — BP 119/80 | HR 98 | Resp 16

## 2023-05-25 DIAGNOSIS — J4520 Mild intermittent asthma, uncomplicated: Secondary | ICD-10-CM

## 2023-05-25 DIAGNOSIS — I728 Aneurysm of other specified arteries: Secondary | ICD-10-CM | POA: Diagnosis not present

## 2023-05-25 DIAGNOSIS — K219 Gastro-esophageal reflux disease without esophagitis: Secondary | ICD-10-CM | POA: Diagnosis not present

## 2023-05-25 DIAGNOSIS — M25571 Pain in right ankle and joints of right foot: Secondary | ICD-10-CM | POA: Diagnosis not present

## 2023-05-25 NOTE — Progress Notes (Unsigned)
MRN : [id number]  Sheri Malone is a 37 y.o. ([birth year]) female who presents with chief complaint of check circulation.  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 There were no problems or complications related to the CT scan. The patient denies interval development of abdominal or back pain. No new lower extremity pain or discoloration of the toes.  She notes that she is still having some left upper quadrant and flank pain.  It seems to come and go perhaps it is a little better.  She does feel that it is not overwhelmingly debilitating.  There is no recent history of TIA symptoms or focal motor deficits. The patient denies PAD or claudication symptoms.   CT angiography of the abdomen and pelvis shows a well treated splenic artery aneurysm with preservation of approximately 50% of the spleen.  There is no evidence of celiac or superior mesenteric artery stenosis.  The arteries appear completely normal.   Current Meds  Medication Sig   albuterol (PROVENTIL HFA;VENTOLIN HFA) 108 (90 Base) MCG/ACT inhaler Inhale 1 puff into the lungs every 6 (six) hours as needed for wheezing or shortness of breath.   azelastine (ASTELIN) 0.1 % nasal spray Place 1 spray into both nostrils daily.   azelastine (OPTIVAR) 0.05 % ophthalmic solution Place 1 drop into both eyes daily as needed (Allergies).   EPINEPHrine 0.3 mg/0.3 mL IJ SOAJ injection Inject 0.3 mg into the muscle as needed for anaphylaxis.   fluticasone (FLONASE) 50 MCG/ACT nasal spray Place 1 spray into the nose daily as needed for allergies.   ipratropium-albuterol (DUONEB) 0.5-2.5 (3) MG/3ML SOLN Take 3 mLs by nebulization every 6 (six) hours as needed (for wheeze, coughing spells, chest tightness, SOB).   levocetirizine (XYZAL) 5  MG tablet Take 5 mg by mouth every evening.   meclizine (ANTIVERT) 25 MG tablet Take 12.5-25 mg by mouth 3 (three) times daily as needed for dizziness.   montelukast (SINGULAIR) 10 MG tablet Take 10 mg by mouth at bedtime.   Norethindrone Acetate-Ethinyl Estrad-FE (JUNEL FE 24) 1-20 MG-MCG(24) tablet Take 1 tablet by mouth daily.   ondansetron (ZOFRAN) 4 MG tablet Take 1 tablet (4 mg total) by mouth every 8 (eight) hours as needed for nausea or vomiting.   pantoprazole (PROTONIX) 40 MG tablet TAKE 1 TABLET (40 MG TOTAL) BY MOUTH IN THE MORNING   SUMAtriptan (IMITREX) 100 MG tablet Take 1 tablet (100 mg total) by mouth once as needed for up to 2 doses. May repeat in 2 hours if headache persists or recurs.   SYMBICORT 160-4.5 MCG/ACT inhaler INHALE 2 PUFFS INTO THE LUNGS IN THE MORNING AND AT BEDTIME.    Past Medical History:  Diagnosis Date   Abnormal Pap smear    Arthritis    ankle   Asthma    GERD (gastroesophageal reflux disease)    Gestational HTN    Headache(784.0)    History of ectopic pregnancy    History of gestational diabetes  Low hemoglobin    on ironspan   Pneumonia    Rhinitis    Vaginal Pap smear, abnormal     Past Surgical History:  Procedure Laterality Date   ANKLE FRACTURE SURGERY Right    x2   CESAREAN SECTION N/A 05/20/2013   Procedure: CESAREAN SECTION;  Surgeon: Genia Del, MD;  Location: [facility];  Service: Obstetrics;  Laterality: N/A;  primary   CESAREAN SECTION N/A 02/04/2018   Procedure: Repeat CESAREAN SECTION;  Surgeon: Noland Fordyce, MD;  Location: [facility] BIRTHING SUITES;  Service: Obstetrics;  Laterality: N/A;  EDD: 02/16/18   CHOLECYSTECTOMY N/A 09/28/2018   Procedure: LAPAROSCOPIC CHOLECYSTECTOMY;  Surgeon: Abigail Miyamoto, MD;  Location: [facility] OR;  Service: General;  Laterality: N/A;   DILATION AND CURETTAGE OF UTERUS  10/21/2007   GASTRIC ROUX-EN-Y N/A 07/07/2022   Procedure: LAPAROSCOPIC ROUX-EN-Y GASTRIC BYPASS WITH UPPER ENDOSCOPY;   Surgeon: Gaynelle Adu, MD;  Location: [facility];  Service: General;  Laterality: N/A;   HIATAL HERNIA REPAIR N/A 07/07/2022   Procedure: HERNIA REPAIR HIATAL;  Surgeon: Gaynelle Adu, MD;  Location: [facility];  Service: General;  Laterality: N/A;   SALPINGOOPHORECTOMY  10/21/2007   left   UNILATERAL SALPINGECTOMY Right 02/04/2018   Procedure: UNILATERAL SALPINGECTOMY;  Surgeon: Noland Fordyce, MD;  Location: [facility] BIRTHING SUITES;  Service: Obstetrics;  Laterality: Right;   VISCERAL ARTERY INTERVENTION N/A 03/25/2022   Procedure: VISCERAL ARTERY INTERVENTION;  Surgeon: Renford Dills, MD;  Location: [facility] INVASIVE CV LAB;  Service: Cardiovascular;  Laterality: N/A;   WISDOM TOOTH EXTRACTION      Social History Social History   Tobacco Use   Smoking status: Never   Smokeless tobacco: Never  Vaping Use   Vaping status: Never Used  Substance Use Topics   Alcohol use: Yes    Comment: occasionally/once a month   Drug use: No    Family History Family History  Problem Relation Age of Onset   Migraines Mother    Hypertension Mother    Breast cancer Mother 89   Stroke Father    Diabetes Father    Hypertension Father    Multiple sclerosis Sister    Cancer Maternal Grandmother    Cancer Maternal Grandfather        prostate   Heart disease Paternal Grandfather    Stomach cancer Neg Hx    Rectal cancer Neg Hx    Esophageal cancer Neg Hx    Colon cancer Neg Hx     Allergies  Allergen Reactions   Dust Mite Mixed Allergen Ext [Mite (D. Farinae)] Anaphylaxis   Other Anaphylaxis and Other (See Comments)    DUST ROACH   Shellfish Allergy      REVIEW OF SYSTEMS (Negative unless checked)  Constitutional: [] Weight loss  [] Fever  [] Chills Cardiac: [] Chest pain   [] Chest pressure   [] Palpitations   [] Shortness of breath when laying flat   [] Shortness of breath with exertion. Vascular:  [x] Pain in legs with walking   [] Pain in legs at rest  [] History of DVT   [] Phlebitis   [] Swelling in  legs   [] Varicose veins   [] Non-healing ulcers Pulmonary:   [] Uses home oxygen   [] Productive cough   [] Hemoptysis   [] Wheeze  [] COPD   [x] Asthma Neurologic:  [] Dizziness   [] Seizures   [] History of stroke   [] History of TIA  [] Aphasia   [] Vissual changes   [] Weakness or numbness in arm   [] Weakness or numbness in leg Musculoskeletal:   [] Joint swelling   [  x]Joint pain   [] Low back pain Hematologic:  [] Easy bruising  [] Easy bleeding   [] Hypercoagulable state   [] Anemic Gastrointestinal:  [] Diarrhea   [] Vomiting  [x] Gastroesophageal reflux/heartburn   [] Difficulty swallowing. Genitourinary:  [] Chronic kidney disease   [] Difficult urination  [] Frequent urination   [] Blood in urine Skin:  [] Rashes   [] Ulcers  Psychological:  [] History of anxiety   []  History of major depression.  Physical Examination  Vitals:   05/25/23 1458  BP: 119/80  Pulse: 98  Resp: 16   There is no height or weight on file to calculate BMI. Gen: WD/WN, NAD Head: [place]/AT, No temporalis wasting.  Ear/Nose/Throat: Hearing grossly intact, nares w/o erythema or drainage Eyes: PER, EOMI, sclera nonicteric.  Neck: Supple, no masses.  No bruit or JVD.  Pulmonary:  Good air movement, no audible wheezing, no use of accessory muscles.  Cardiac: RRR, normal S1, S2, no Murmurs. Vascular:  mild trophic changes, no open wounds Vessel Right Left  Radial Palpable Palpable  Gastrointestinal: soft, non-distended. No guarding/no peritoneal signs.  Musculoskeletal: M/S 5/5 throughout.  No visible deformity.  Neurologic: CN 2-12 intact. Pain and light touch intact in extremities.  Symmetrical.  Speech is fluent. Motor exam as listed above. Psychiatric: Judgment intact, Mood & affect appropriate for pt's clinical situation. Dermatologic: No rashes or ulcers noted.  No changes consistent with cellulitis.   CBC Lab Results  Component Value Date   WBC 9.6 09/19/2022   HGB 11.7 09/19/2022   HCT 36.0 09/19/2022   MCV 81.6 09/19/2022    PLT 370 09/19/2022    BMET    Component Value Date/Time   NA 139 09/19/2022 1514   NA 139 07/19/2021 0951   K 4.0 09/19/2022 1514   CL 103 09/19/2022 1514   CO2 25 09/19/2022 1514   GLUCOSE 81 09/19/2022 1514   BUN 8 09/19/2022 1514   BUN 10 07/19/2021 0951   CREATININE 0.83 09/19/2022 1514   CALCIUM 9.4 09/19/2022 1514   GFRNONAA >60 07/08/2022 0436   GFRNONAA 104 11/15/2020 0922   GFRAA 121 11/15/2020 0922   CrCl cannot be calculated (Patient's most recent lab result is older than the maximum 21 days allowed.).  COAG Lab Results  Component Value Date   INR 1.3 (H) 03/30/2022    Radiology CT Angio Abd/Pel w/ and/or w/o  Result Date: 05/18/2023 CLINICAL DATA:  History of splenic artery aneurysm with postprandial abdominal pain. EXAM: CTA ABDOMEN AND PELVIS WITHOUT AND WITH CONTRAST TECHNIQUE: Multidetector CT imaging of the abdomen and pelvis was performed using the standard protocol during bolus administration of intravenous contrast. Multiplanar reconstructed images and MIPs were obtained and reviewed to evaluate the vascular anatomy. RADIATION DOSE REDUCTION: This exam was performed according to the departmental dose-optimization program which includes automated exposure control, adjustment of the mA and/or kV according to patient size and/or use of iterative reconstruction technique. CONTRAST:  OMNIPAQUE IOHEXOL 350 MG/ML SOLN COMPARISON:  Prior CT scan the abdomen and pelvis 03/29/2022 FINDINGS: VASCULAR Aorta: Normal caliber aorta without aneurysm, dissection, vasculitis or significant stenosis. Celiac: No evidence of stenosis or irregularity at the celiac origin. Distal embolization of the splenic artery and associated splenic aneurysm. SMA: Patent without evidence of aneurysm, dissection, vasculitis or significant stenosis. Renals: Both renal arteries are patent without evidence of aneurysm, dissection, vasculitis, fibromuscular dysplasia or significant stenosis. IMA:  Patent without evidence of aneurysm, dissection, vasculitis or significant stenosis. Inflow: Patent without evidence of aneurysm, dissection, vasculitis or significant stenosis. Proximal Outflow: Bilateral  common femoral and visualized portions of the superficial and profunda femoral arteries are patent without evidence of aneurysm, dissection, vasculitis or significant stenosis. Veins: No focal venous abnormality. Review of the MIP images confirms the above findings. NON-VASCULAR Lower chest: No acute abnormality. Hepatobiliary: No focal liver abnormality is seen. Status post cholecystectomy. No biliary dilatation. Pancreas: Unremarkable. No pancreatic ductal dilatation or surrounding inflammatory changes. Spleen: Evolution of previously identified splenic infarct. There is relative atrophy of the central aspect of the spleen. Adrenals/Urinary Tract: Adrenal glands are unremarkable. Kidneys are normal, without renal calculi, focal lesion, or hydronephrosis. Bladder is unremarkable. Stomach/Bowel: Surgical changes of prior gastric bypass procedure. No evidence of bowel obstruction. Large volume of stool in the rectum suggests constipation. Lymphatic: No suspicious lymphadenopathy. Reproductive: Uterus and bilateral adnexa are unremarkable. Other: Diastasis recti without definite incisional hernia. No ascites. Musculoskeletal: No acute or significant osseous findings. IMPRESSION: VASCULAR 1. Successful embolization of the distal splenic artery and associated splenic aneurysm. 2. No evidence of mesenteric arterial stenosis or occlusion to suggest a source for mesenteric ischemia. NON-VASCULAR 1. Expected evolution of central splenic infarct with resultant atrophy. 2. Surgical changes of prior gastric bypass procedure. No evidence of obstruction. 3. Diastasis recti without definite incisional hernia. Electronically Signed   By: Malachy Moan M.D.   On: 05/18/2023 11:04   VAS [name] MESENTERIC  Result Date:  05/13/2023 ABDOMINAL VISCERAL Patient Name:  Sheri Malone  Date of Exam:   05/11/2023 Medical Rec #: [id number]        Accession #:    [id number] Date of Birth: 12-17-1985        Patient Gender: F Patient Age:   13 years Exam Location:  [facility] Procedure:      VAS [name] MESENTERIC Referring Phys: Georgiana Spinner -------------------------------------------------------------------------------- Indications: Postprandial pain Vascular Interventions: 03/25/2022 splenic artery repair with coil embolization. Comparison Study: 04/2022 Performing Technologist: Salvadore Farber RVT  Examination Guidelines: A complete evaluation includes B-mode imaging, spectral Doppler, color Doppler, and power Doppler as needed of all accessible portions of each vessel. Bilateral testing is considered an integral part of a complete examination. Limited examinations for reoccurring indications may be performed as noted.  Duplex Findings: +--------------------+--------+--------+------+--------+ Mesenteric          PSV cm/sEDV cm/sPlaqueComments +--------------------+--------+--------+------+--------+ Aorta Mid              99                          +--------------------+--------+--------+------+--------+ Celiac Artery Origin  175                          +--------------------+--------+--------+------+--------+ SMA Proximal          329                          +--------------------+--------+--------+------+--------+ SMA Mid               248                          +--------------------+--------+--------+------+--------+ CHA                   136                          +--------------------+--------+--------+------+--------+ Splenic  148                          +--------------------+--------+--------+------+--------+    Summary: Largest Aortic Diameter: 1.9 cm  Mesenteric: Normal Celiac artery , Inferior Mesenteric artery, Splenic artery and Hepatic artery findings. 70 to 99%  stenosis in the superior mesenteric artery. No evidence of previous splenic artery aneurysm at the hilum s/p coil embolization. The SMA now shows abnormally elevated velocities in the proximal SMA with no evidence of significant plaque. Patient underwent weight loss surgery in september of 2023.  *See table(s) above for measurements and observations.  Diagnosing physician: Levora Dredge MD  Electronically signed by Levora Dredge MD on 05/13/2023 at 4:27:10 PM.    Final    MM 3D SCREENING MAMMOGRAM BILATERAL BREAST  Result Date: 04/27/2023 CLINICAL DATA:  Screening. EXAM: DIGITAL SCREENING BILATERAL MAMMOGRAM WITH TOMOSYNTHESIS AND CAD TECHNIQUE: Bilateral screening digital craniocaudal and mediolateral oblique mammograms were obtained. Bilateral screening digital breast tomosynthesis was performed. The images were evaluated with computer-aided detection. COMPARISON:  None available. ACR Breast Density Category b: There are scattered areas of fibroglandular density. FINDINGS: There are no findings suspicious for malignancy. IMPRESSION: No mammographic evidence of malignancy. A result letter of this screening mammogram will be mailed directly to the patient. RECOMMENDATION: Screening mammogram in one year. (Code:SM-B-01Y) BI-RADS CATEGORY  1: Negative. Electronically Signed   By: Norva Pavlov M.D.   On: 04/27/2023 15:32     Assessment/Plan There are no diagnoses linked to this encounter.   Levora Dredge, MD  05/25/2023 4:27 PM

## 2023-05-26 ENCOUNTER — Encounter (INDEPENDENT_AMBULATORY_CARE_PROVIDER_SITE_OTHER): Payer: Self-pay | Admitting: Vascular Surgery

## 2023-05-28 ENCOUNTER — Ambulatory Visit (INDEPENDENT_AMBULATORY_CARE_PROVIDER_SITE_OTHER): Payer: BC Managed Care – PPO | Admitting: Vascular Surgery

## 2023-06-30 ENCOUNTER — Encounter: Payer: Self-pay | Admitting: Skilled Nursing Facility1

## 2023-06-30 ENCOUNTER — Encounter: Payer: BC Managed Care – PPO | Attending: General Surgery | Admitting: Skilled Nursing Facility1

## 2023-06-30 VITALS — Ht 63.0 in | Wt 166.8 lb

## 2023-06-30 DIAGNOSIS — Z6829 Body mass index (BMI) 29.0-29.9, adult: Secondary | ICD-10-CM | POA: Insufficient documentation

## 2023-06-30 DIAGNOSIS — Z713 Dietary counseling and surveillance: Secondary | ICD-10-CM | POA: Diagnosis not present

## 2023-06-30 NOTE — Progress Notes (Signed)
Bariatric Nutrition Follow-Up Visit Medical Nutrition Therapy   Appt start time: 7:45 End time 8:00  Surgery date: 07/07/2022 Surgery type: RYGB  NUTRITION ASSESSMENT   Anthropometrics  Start weight at [facility]: 226 lbs (date: 11/20/2021)  Height: 62 in Weight today: 166.8 pounds    Clinical  Medical hx: vertigo, nausea, GERD Medications: see list  Labs: A1C 5.6 Notable signs/symptoms: monthly migraines  Any previous deficiencies? No   Body Composition Scale 01/01/2023 03/24/2023 06/30/2023  Current Body Weight 170.6 165.3 166.8  Total Body Fat % 34.1 33 33.4  Visceral Fat 8 8 8   Fat-Free Mass % 65.8 66.9 66.5   Total Body Water % 47.4 47.9 47.7  Muscle-Mass lbs 31.0 30.9 30.9  BMI 29.8 28.9 29.2  Body Fat Displacement            Torso  lbs 35.9 33.7 34.4         Left Leg  lbs 7.1 6.7 6.8         Right Leg  lbs 7.1 6.7 6.8         Left Arm  lbs 3.5 3.3 3.4         Right Arm   lbs 3.5 3.3 3.4     Lifestyle & Dietary Hx  Pt states she has to remind herself just because someone else lost 100 pounds does not mean she will.    Estimated daily fluid intake: 160+ oz Estimated daily protein intake: 60 g Supplements: multi and calcium  Current average weekly physical activity: 3-4 days a week: 15-30 min treadmill and then 30 minutes weights or walking while her kids do their sports   24-Hr Dietary Recall First Meal 8:45-9:30am: 20 g pro yogurt + protein granola or meat Snack: slim jims Second Meal: steak on salad  Snack: peanut butter jelly wafer bar ir dried green beans Third Meal: ribs + greens + corn + black eyed peas Snack: maybe nuts or half a protein shake Beverages: protein coffee + sugar coffee, water, gatorade zero, occasionally half and half tea, zero calorie sprite (sip on it for a week)  Post-Op Goals/ Signs/ Symptoms Using straws: no Drinking while eating: no Chewing/swallowing difficulties: no Changes in vision: no Changes to mood/headaches: no Hair  loss/changes to skin/nails: no Difficulty focusing/concentrating: no Sweating: no Limb weakness: on Dizziness/lightheadedness: no Palpitations: no  Carbonated/caffeinated beverages: no N/V/D/C/Gas: no Abdominal pain: no Dumping syndrome: no    NUTRITION DIAGNOSIS  Overweight/obesity (JAARS-3.3) related to past poor dietary habits and physical inactivity as evidenced by completed bariatric surgery and following dietary guidelines for continued weight loss and healthy nutrition status.     NUTRITION INTERVENTION Nutrition counseling (C-1) and education (E-2) to facilitate bariatric surgery goals, including: The importance of consuming adequate calories as well as certain nutrients daily due to the body's need for essential vitamins, minerals, and fats The importance of daily physical activity and to reach a goal of at least 150 minutes of moderate to vigorous physical activity weekly (or as directed by their physician) due to benefits such as increased musculature and improved lab values The importance of intuitive eating specifically learning hunger-satiety cues and understanding the importance of learning a new body: The importance of mindful eating to avoid grazing behaviors  Encouraged patient to honor their body's internal hunger and fullness cues.  Throughout the day, check in mentally and rate hunger. Stop eating when satisfied not full regardless of how much food is left on the plate.  Get more if still  hungry 20-30 minutes later.  The key is to honor satisfaction so throughout the meal, rate fullness factor and stop when comfortably satisfied not physically full. The key is to honor hunger and fullness without any feelings of guilt or shame.  Pay attention to what the internal cues are, rather than any external factors. This will enhance the confidence you have in listening to your own body and following those internal cues enabling you to increase how often you eat when you are hungry not  out of appetite and stop when you are satisfied not full.  Encouraged pt to continue to eat balanced meals inclusive of non starchy vegetables 2 times a day 7 days a week Encouraged pt to choose lean protein sources: limiting beef, pork, sausage, hotdogs, and lunch meat Encourage pt to choose healthy fats such as plant based limiting animal fats Encouraged pt to continue to drink a minium 64 fluid ounces with half being plain water to satisfy proper hydration  Creation of balanced and diverse meals to increase the intake of nutrient-rich foods that provide essential vitamins, minerals, fiber, and phytonutrients  Variety of Fruits and Vegetables:  Aim for a colorful array of fruits and vegetables to ensure a wide range of nutrients. Include a mix of leafy greens, berries, citrus fruits, cruciferous vegetables, and more. Whole Grains: Choose whole grains over refined grains. Examples include brown rice, quinoa, oats, whole wheat, and barley. Lean Proteins: Include lean sources of protein, such as poultry, fish, tofu, legumes, beans, lentils, and low-fat dairy products. Limit red and processed meats. Healthy Fats: Incorporate sources of healthy fats, including avocados, nuts, seeds, and olive oil. Limit saturated and trans fats found in fried and processed foods. Dairy or Dairy Alternatives: Choose low-fat or fat-free dairy products, or plant-based alternatives like almond or soy milk. Portion Control: Be mindful of portion sizes to avoid overeating. Pay attention to hunger and satisfaction cues. Limit Added Sugars: Minimize the consumption of sugary beverages, snacks, and desserts. Check food labels for added sugars and opt for natural sources of sweetness such as whole fruits. Hydration: Drink plenty of water throughout the day. Limit sugary drinks and excessive caffeine intake. Moderate Sodium Intake: Reduce the consumption of high-sodium foods. Use herbs and spices for flavor instead of  excessive salt. Meal Planning and Preparation: Plan and prepare meals ahead of time to make healthier choices more convenient. Include a mix of food groups in each meal. Limit Processed Foods: Minimize the intake of highly processed and packaged foods that are often high in added sugars, salt, and unhealthy fats. Regular Physical Activity: Combine a healthy diet with regular physical activity for overall well-being. Aim for at least 150 minutes of moderate-intensity aerobic exercise per week, along with strength training. Moderation and Balance: Enjoy treats and indulgent foods in moderation, emphasizing balance rather than strict restriction.  Goals -continue to be aware of your behaviors  -remember: you are physically fit and feel healthy sot he number of pounds lost is irrelevant    Handouts Previously Provided Include  1 year  Learning Style & Readiness for Change Teaching method utilized: Visual & Auditory  Demonstrated degree of understanding via: Teach Back  Readiness Level: action Barriers to learning/adherence to lifestyle change: unidentified   RD's Notes for Next Visit Assess adherence to pt chosen goals    MONITORING & EVALUATION Dietary intake, weekly physical activity, body weight  Next Steps Patient is to return in 6 months

## 2023-07-29 DIAGNOSIS — E559 Vitamin D deficiency, unspecified: Secondary | ICD-10-CM | POA: Diagnosis not present

## 2023-07-29 DIAGNOSIS — Z8639 Personal history of other endocrine, nutritional and metabolic disease: Secondary | ICD-10-CM | POA: Diagnosis not present

## 2023-07-29 DIAGNOSIS — Z9884 Bariatric surgery status: Secondary | ICD-10-CM | POA: Diagnosis not present

## 2023-07-29 DIAGNOSIS — K219 Gastro-esophageal reflux disease without esophagitis: Secondary | ICD-10-CM | POA: Diagnosis not present

## 2023-07-29 DIAGNOSIS — E569 Vitamin deficiency, unspecified: Secondary | ICD-10-CM | POA: Diagnosis not present

## 2023-07-29 DIAGNOSIS — E663 Overweight: Secondary | ICD-10-CM | POA: Diagnosis not present

## 2023-11-29 ENCOUNTER — Other Ambulatory Visit: Payer: Self-pay | Admitting: Family Medicine

## 2023-11-29 DIAGNOSIS — K219 Gastro-esophageal reflux disease without esophagitis: Secondary | ICD-10-CM

## 2023-11-30 NOTE — Telephone Encounter (Signed)
 Requested Prescriptions  Pending Prescriptions Disp Refills   pantoprazole  (PROTONIX ) 40 MG tablet [Pharmacy Med Name: PANTOPRAZOLE  SOD DR 40 MG TAB] 90 tablet 0    Sig: TAKE 1 TABLET (40 MG TOTAL) BY MOUTH IN THE MORNING     Gastroenterology: Proton Pump Inhibitors Passed - 11/30/2023  2:39 PM      Passed - Valid encounter within last 12 months    Recent Outpatient Visits           9 months ago Annual physical exam   [facility] Adeline Hone, PA-C   1 year ago Irregular periods   [facility] Quinton Buckler, FNP   1 year ago Encounter for examination following treatment at hospital   [facility] Adeline Hone, PA-C   1 year ago Annual physical exam   [facility] Adeline Hone, PA-C   2 years ago Class 2 severe obesity with serious comorbidity and body mass index (BMI) of 39.0 to 39.9 in adult, unspecified obesity type [facility])   [facility] Quinton Buckler, [place]

## 2023-12-28 ENCOUNTER — Ambulatory Visit: Payer: BC Managed Care – PPO | Admitting: Skilled Nursing Facility1

## 2024-01-01 ENCOUNTER — Other Ambulatory Visit: Payer: Self-pay

## 2024-01-01 ENCOUNTER — Encounter: Payer: Self-pay | Admitting: Emergency Medicine

## 2024-01-01 ENCOUNTER — Ambulatory Visit
Admission: EM | Admit: 2024-01-01 | Discharge: 2024-01-01 | Disposition: A | Discharge status: Home / Self Care | Attending: Emergency Medicine | Admitting: Emergency Medicine

## 2024-01-01 DIAGNOSIS — J0100 Acute maxillary sinusitis, unspecified: Secondary | ICD-10-CM

## 2024-01-01 MED ORDER — AMOXICILLIN-POT CLAVULANATE 875-125 MG PO TABS: 1.0000 | ORAL_TABLET | Freq: Two times a day (BID) | ORAL | 0 refills | Status: AC

## 2024-01-01 MED ORDER — PREDNISONE 20 MG PO TABS: 40.0000 mg | ORAL_TABLET | Freq: Every day | ORAL | 0 refills | Status: DC

## 2024-01-01 NOTE — Discharge Instructions (Signed)
 Begin Augmentin morning and every evening for 10 days to provide coverage for bacteria  Starting tomorrow take prednisone every morning with food to help reduce sinus pressure    You can take Tylenol and/or Ibuprofen as needed for fever reduction and pain relief.   For cough: honey 1/2 to 1 teaspoon (you can dilute the honey in water or another fluid).  You can also use guaifenesin and dextromethorphan for cough. You can use a humidifier for chest congestion and cough.  If you don't have a humidifier, you can sit in the bathroom with the hot shower running.      For sore throat: try warm salt water gargles, cepacol lozenges, throat spray, warm tea or water with lemon/honey, popsicles or ice, or OTC cold relief medicine for throat discomfort.   For congestion: take a daily anti-histamine like Zyrtec, Claritin, and a oral decongestant, such as pseudoephedrine.  You can also use Flonase 1-2 sprays in each nostril daily.   It is important to stay hydrated: drink plenty of fluids (water, gatorade/powerade/pedialyte, juices, or teas) to keep your throat moisturized and help further relieve irritation/discomfort.

## 2024-01-01 NOTE — ED Triage Notes (Signed)
 PAtient presents to [facility] for evaluation of left ear fullness x 2 weeks with pain increasing over time.  Says she had a URI in [place] 2 weeks ago, flew home, and it started.  Hx of vertigo and concern for that starting up

## 2024-01-01 NOTE — ED Provider Notes (Signed)
 Sheri Malone    CSN: [id number] Arrival date & time: 01/01/24  1810      History   Chief Complaint Chief Complaint  Patient presents with   Ear Pain    HPI Sheri Malone is a 38 y.o. female.   Patient presents for evaluation of nasal congestion present for 2 weeks.  Has been additionally experiencing ear fullness, pressure and popping.  Present to the left side.  Experiencing sinus pressure on the left side.  Symptoms exacerbated by chewing.  Had 1 occurrence of dizziness today, history of vertigo.  Denies sore throat, shortness of breath or wheezing.  Past Medical History:  Diagnosis Date   Abnormal Pap smear    Arthritis    ankle   Asthma    GERD (gastroesophageal reflux disease)    Gestational HTN    Headache(784.0)    History of ectopic pregnancy    History of gestational diabetes    Low hemoglobin    on ironspan   Pneumonia    Rhinitis    Vaginal Pap smear, abnormal     Patient Active Problem List   Diagnosis Date Noted   Superior mesenteric artery stenosis (HCC) 05/11/2023   S/P gastric bypass 07/07/2022   Left lower lobe pneumonia 03/30/2022   SIRS, possible sepsis (systemic inflammatory response syndrome) (HCC) 03/30/2022   S/P coil embolization of splenic artery aneurysm 03/25/22 03/30/2022   Vertigo 11/24/2021   BPPV (benign paroxysmal positional vertigo), left 11/15/2021   Splenic artery aneurysm (HCC) 08/06/2021   Effusion of right ankle 07/31/2021   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   Iron deficiency anemia 11/15/2020   Slow transit constipation 11/15/2020   Microcytosis 11/15/2020   Arthralgia of right ankle 08/15/2020   Stiffness of right ankle joint 08/15/2020   Closed trimalleolar fracture of right ankle 05/21/2020   Gastro-esophageal reflux disease without esophagitis 04/13/2020   Obesity, Class III, BMI 40-49.9 (morbid obesity) (HCC) 01/20/2020   Vitamin D deficiency 01/20/2020   Prediabetes 01/20/2020   Insulin resistance  01/20/2020   Metabolic syndrome 01/20/2020   Allergic rhinitis 11/23/2016   Allergic conjunctivitis 01/17/2016   Mild intermittent asthma 01/17/2016   Dermatitis, eczematoid 06/21/2015   H/O cesarean section 06/21/2015   History of diabetes mellitus arising in pregnancy 06/21/2015   Headache, migraine 06/21/2015    Past Surgical History:  Procedure Laterality Date   ANKLE FRACTURE SURGERY Right    x2   CESAREAN SECTION N/A 05/20/2013   Procedure: CESAREAN SECTION;  Surgeon: Genia Del, MD;  Location: [facility];  Service: Obstetrics;  Laterality: N/A;  primary   CESAREAN SECTION N/A 02/04/2018   Procedure: Repeat CESAREAN SECTION;  Surgeon: Noland Fordyce, MD;  Location: [facility] BIRTHING SUITES;  Service: Obstetrics;  Laterality: N/A;  EDD: 02/16/18   CHOLECYSTECTOMY N/A 09/28/2018   Procedure: LAPAROSCOPIC CHOLECYSTECTOMY;  Surgeon: Abigail Miyamoto, MD;  Location: [facility] OR;  Service: General;  Laterality: N/A;   DILATION AND CURETTAGE OF UTERUS  10/21/2007   GASTRIC ROUX-EN-Y N/A 07/07/2022   Procedure: LAPAROSCOPIC ROUX-EN-Y GASTRIC BYPASS WITH UPPER ENDOSCOPY;  Surgeon: Gaynelle Adu, MD;  Location: [facility];  Service: General;  Laterality: N/A;   HIATAL HERNIA REPAIR N/A 07/07/2022   Procedure: HERNIA REPAIR HIATAL;  Surgeon: Gaynelle Adu, MD;  Location: [facility];  Service: General;  Laterality: N/A;   SALPINGOOPHORECTOMY  10/21/2007   left   UNILATERAL SALPINGECTOMY Right 02/04/2018   Procedure: UNILATERAL SALPINGECTOMY;  Surgeon: Noland Fordyce, MD;  Location: [facility] BIRTHING SUITES;  Service:  Obstetrics;  Laterality: Right;   VISCERAL ARTERY INTERVENTION N/A 03/25/2022   Procedure: VISCERAL ARTERY INTERVENTION;  Surgeon: Renford Dills, MD;  Location: [facility] INVASIVE CV LAB;  Service: Cardiovascular;  Laterality: N/A;   WISDOM TOOTH EXTRACTION      OB History     Gravida  3   Para  2   Term  2   Preterm  0   AB  1   Living  2      SAB  0   IAB  0   Ectopic  1    Multiple      Live Births  2            Home Medications    Prior to Admission medications   Medication Sig Start Date End Date Taking? Authorizing Provider  amoxicillin-clavulanate (AUGMENTIN) 875-125 MG tablet Take 1 tablet by mouth every 12 (twelve) hours for 10 days. 01/01/24 01/11/24 Yes Jayon Matton R, NP  predniSONE (DELTASONE) 20 MG tablet Take 2 tablets (40 mg total) by mouth daily. 01/01/24  Yes Alek Borges R, NP  albuterol (PROVENTIL HFA;VENTOLIN HFA) 108 (90 Base) MCG/ACT inhaler Inhale 1 puff into the lungs every 6 (six) hours as needed for wheezing or shortness of breath.    [provider]  azelastine (ASTELIN) 0.1 % nasal spray Place 1 spray into both nostrils daily. 02/25/19   [provider]  azelastine (OPTIVAR) 0.05 % ophthalmic solution Place 1 drop into both eyes daily as needed (Allergies). 02/25/19   [provider]  budesonide (PULMICORT) 0.5 MG/2ML nebulizer solution Take 2 mLs (0.5 mg total) by nebulization in the morning and at bedtime for 14 days. Patient taking differently: Take 0.5 mg by nebulization daily as needed (Shortness of breath). 04/01/22 06/18/22  Danelle Berry, PA-C  EPINEPHrine 0.3 mg/0.3 mL IJ SOAJ injection Inject 0.3 mg into the muscle as needed for anaphylaxis.    [provider]  fluticasone (FLONASE) 50 MCG/ACT nasal spray Place 1 spray into the nose daily as needed for allergies. 10/21/21   [provider]  ipratropium-albuterol (DUONEB) 0.5-2.5 (3) MG/3ML SOLN Take 3 mLs by nebulization every 6 (six) hours as needed (for wheeze, coughing spells, chest tightness, SOB). 04/01/22   Danelle Berry, PA-C  levocetirizine (XYZAL) 5 MG tablet Take 5 mg by mouth every evening.    [provider]  meclizine (ANTIVERT) 25 MG tablet Take 12.5-25 mg by mouth 3 (three) times daily as needed for dizziness. 05/24/22   [provider]  montelukast (SINGULAIR) 10 MG tablet Take 10 mg by mouth at bedtime.     [provider]  Norethindrone Acetate-Ethinyl Estrad-FE (JUNEL FE 24) 1-20 MG-MCG(24) tablet Take 1 tablet by mouth daily. Take hormonal pill continuously 02/06/23   Danelle Berry, PA-C  Norethindrone Acetate-Ethinyl Estrad-FE (JUNEL FE 24) 1-20 MG-MCG(24) tablet Take 1 tablet by mouth daily.    [provider]  ondansetron (ZOFRAN) 4 MG tablet Take 1 tablet (4 mg total) by mouth every 8 (eight) hours as needed for nausea or vomiting. 07/08/22   Gaynelle Adu, MD  pantoprazole (PROTONIX) 40 MG tablet TAKE 1 TABLET (40 MG TOTAL) BY MOUTH IN THE MORNING 11/30/23   Danelle Berry, PA-C  SUMAtriptan (IMITREX) 100 MG tablet Take 1 tablet (100 mg total) by mouth once as needed for up to 2 doses. May repeat in 2 hours if headache persists or recurs. 11/22/21   Anson Fret, MD  SYMBICORT 160-4.5 MCG/ACT inhaler INHALE 2 PUFFS  INTO THE LUNGS IN THE MORNING AND AT BEDTIME. 07/10/21   Danelle Berry, PA-C    Family History Family History  Problem Relation Age of Onset   Migraines Mother    Hypertension Mother    Breast cancer Mother 73   Stroke Father    Diabetes Father    Hypertension Father    Multiple sclerosis Sister    Cancer Maternal Grandmother    Cancer Maternal Grandfather        prostate   Heart disease Paternal Grandfather    Stomach cancer Neg Hx    Rectal cancer Neg Hx    Esophageal cancer Neg Hx    Colon cancer Neg Hx     Social History Social History   Tobacco Use   Smoking status: Never   Smokeless tobacco: Never  Vaping Use   Vaping status: Never Used  Substance Use Topics   Alcohol use: Yes    Comment: occasionally/once a month   Drug use: No     Allergies   Dust mite mixed allergen ext [mite (d. farinae)], Other, and Shellfish allergy   Review of Systems Review of Systems   Physical Exam Triage Vital Signs ED Triage Vitals [01/01/24 1820]  Encounter Vitals Group     BP 131/88     Systolic BP Percentile      Diastolic BP Percentile       Pulse Rate 75     Resp 16     Temp 97.6 F (36.4 C)     Temp Source Temporal     SpO2 98 %     Weight      Height      Head Circumference      Peak Flow      Pain Score      Pain Loc      Pain Education      Exclude from Growth Chart    No data found.  Updated Vital Signs BP 131/88 (BP Location: Left Arm)   Pulse 75   Temp 97.6 F (36.4 C) (Temporal)   Resp 16   LMP 12/05/2023   SpO2 98%   Visual Acuity Right Eye Distance:   Left Eye Distance:   Bilateral Distance:    Right Eye Near:   Left Eye Near:    Bilateral Near:     Physical Exam Constitutional:      Appearance: Normal appearance.  HENT:     Head: Normocephalic.     Right Ear: Tympanic membrane, ear canal and external ear normal.     Left Ear: Tympanic membrane, ear canal and external ear normal.     Nose: Congestion present.     Right Sinus: Maxillary sinus tenderness present.     Left Sinus: Maxillary sinus tenderness present.     Mouth/Throat:     Pharynx: No oropharyngeal exudate or posterior oropharyngeal erythema.  Eyes:     Extraocular Movements: Extraocular movements intact.  Cardiovascular:     Rate and Rhythm: Normal rate and regular rhythm.     Pulses: Normal pulses.     Heart sounds: Normal heart sounds.  Pulmonary:     Effort: Pulmonary effort is normal.     Breath sounds: Normal breath sounds.  Neurological:     Mental Status: She is alert and oriented to person, place, and time. Mental status is at baseline.      UC Treatments / Results  Labs (all labs ordered are listed, but only abnormal results are displayed)  Labs Reviewed - No data to display  EKG   Radiology No results found.  Procedures Procedures (including critical care time)  Medications Ordered in UC Medications - No data to display  Initial Impression / Assessment and Plan / UC Course  I have reviewed the triage vital signs and the nursing notes.  Pertinent labs & imaging results that were available  during my care of the patient were reviewed by me and considered in my medical decision making (see chart for details).  Acute nonrecurrent maxillary sinusitis  Patient is in no signs of distress nor toxic appearing.  Vital signs are stable.  Low suspicion for pneumonia, pneumothorax or bronchitis and therefore will defer imaging.presentation and symptomology consistent with a sinusitis, present for 2 weeks will provide bacterial coverage.  Prescribed Augmentin and prednisone.  May use additional over-the-counter medications as needed for supportive care.  May follow-up with urgent care as needed if symptoms persist or worsen.   Final Clinical Impressions(s) / UC Diagnoses   Final diagnoses:  Acute non-recurrent maxillary sinusitis     Discharge Instructions      Begin Augmentin morning and every evening for 10 days to provide coverage for bacteria  Starting tomorrow take prednisone every morning with food to help reduce sinus pressure    You can take Tylenol and/or Ibuprofen as needed for fever reduction and pain relief.   For cough: honey 1/2 to 1 teaspoon (you can dilute the honey in water or another fluid).  You can also use guaifenesin and dextromethorphan for cough. You can use a humidifier for chest congestion and cough.  If you don't have a humidifier, you can sit in the bathroom with the hot shower running.      For sore throat: try warm salt water gargles, cepacol lozenges, throat spray, warm tea or water with lemon/honey, popsicles or ice, or OTC cold relief medicine for throat discomfort.   For congestion: take a daily anti-histamine like Zyrtec, Claritin, and a oral decongestant, such as pseudoephedrine.  You can also use Flonase 1-2 sprays in each nostril daily.   It is important to stay hydrated: drink plenty of fluids (water, gatorade/powerade/pedialyte, juices, or teas) to keep your throat moisturized and help further relieve irritation/discomfort.    ED  Prescriptions     Medication Sig Dispense Auth. Provider   amoxicillin-clavulanate (AUGMENTIN) 875-125 MG tablet Take 1 tablet by mouth every 12 (twelve) hours for 10 days. 20 tablet Joesph Marcy R, NP   predniSONE (DELTASONE) 20 MG tablet Take 2 tablets (40 mg total) by mouth daily. 10 tablet Valinda Hoar, NP      PDMP not reviewed this encounter.   Valinda Hoar, NP 01/01/24 [phone]

## 2024-01-04 ENCOUNTER — Encounter: Payer: BC Managed Care – PPO | Attending: Family Medicine | Admitting: Skilled Nursing Facility1

## 2024-01-04 ENCOUNTER — Encounter: Payer: Self-pay | Admitting: Skilled Nursing Facility1

## 2024-01-04 VITALS — Wt 173.3 lb

## 2024-01-04 DIAGNOSIS — E119 Type 2 diabetes mellitus without complications: Secondary | ICD-10-CM | POA: Diagnosis not present

## 2024-01-04 NOTE — Progress Notes (Signed)
 Bariatric Nutrition Follow-Up Visit Medical Nutrition Therapy   Appt start time: 12:06 End time 12:31  Surgery date: 07/07/2022 Surgery type: RYGB  NUTRITION ASSESSMENT   Anthropometrics  Start weight at [facility]: 226 lbs (date: 11/20/2021)  Height: 62 in Weight today: 173.3 pounds    Clinical  Medical hx: vertigo, nausea, GERD Medications: see list  Labs: A1C 5.6 Notable signs/symptoms: monthly migraines  Any previous deficiencies? No   Body Composition Scale 03/24/2023 06/30/2023 01/04/2024  Current Body Weight 165.3 166.8 173.3  Total Body Fat % 33 33.4 34.8  Visceral Fat 8 8 9   Fat-Free Mass % 66.9 66.5 65.1   Total Body Water % 47.9 47.7 47  Muscle-Mass lbs 30.9 30.9 30.9  BMI 28.9 29.2 30.3  Body Fat Displacement            Torso  lbs 33.7 34.4 37.3         Left Leg  lbs 6.7 6.8 7.4         Right Leg  lbs 6.7 6.8 7.4         Left Arm  lbs 3.3 3.4 3.7         Right Arm   lbs 3.3 3.4 3.7     Lifestyle & Dietary Hx  Pt sates is not too happy with recent weight gain. Pt states she changed jobs and is more sedentary with that job. Pt states she eats out MAYBE once a week so less than with her previous job.  Pt states she did add in sweet the and soda back al;ready looking at redcing those and increasing her activity.   Estimated daily fluid intake: unknown Estimated daily protein intake: 60 g Supplements: multi and calcium  Current average weekly physical activity: ADL's  24-Hr Dietary Recall First Meal 8:45-9:30am: oatmeal or cheese and crackers or pork chop + greens + mac n cheese Snack:  Second Meal: pork chop + greens + mac n cheese or salad  Snack: popcorn or harvest chips Third Meal: ribs + greens + corn + black eyed peas Snack: cheese and crackers, ginger snaps Beverages: mini gingerale, sweet tea, coffee + sweet n low, water  Post-Op Goals/ Signs/ Symptoms Using straws: no Drinking while eating: no Chewing/swallowing difficulties: no Changes in  vision: no Changes to mood/headaches: no Hair loss/changes to skin/nails: no Difficulty focusing/concentrating: no Sweating: no Limb weakness: on Dizziness/lightheadedness: no Palpitations: no  Carbonated/caffeinated beverages: no N/V/D/C/Gas: no Abdominal pain: no Dumping syndrome: no    NUTRITION DIAGNOSIS  Overweight/obesity ([place]-3.3) related to past poor dietary habits and physical inactivity as evidenced by completed bariatric surgery and following dietary guidelines for continued weight loss and healthy nutrition status.     NUTRITION INTERVENTION Nutrition counseling (C-1) and education (E-2) to facilitate bariatric surgery goals, including: The importance of consuming adequate calories as well as certain nutrients daily due to the body's need for essential vitamins, minerals, and fats The importance of daily physical activity and to reach a goal of at least 150 minutes of moderate to vigorous physical activity weekly (or as directed by their physician) due to benefits such as increased musculature and improved lab values The importance of intuitive eating specifically learning hunger-satiety cues and understanding the importance of learning a new body: The importance of mindful eating to avoid grazing behaviors  Encouraged patient to honor their body's internal hunger and fullness cues.  Throughout the day, check in mentally and rate hunger. Stop eating when satisfied not full regardless of how much food  is left on the plate.  Get more if still hungry 20-30 minutes later.  The key is to honor satisfaction so throughout the meal, rate fullness factor and stop when comfortably satisfied not physically full. The key is to honor hunger and fullness without any feelings of guilt or shame.  Pay attention to what the internal cues are, rather than any external factors. This will enhance the confidence you have in listening to your own body and following those internal cues enabling you to  increase how often you eat when you are hungry not out of appetite and stop when you are satisfied not full.  Encouraged pt to continue to eat balanced meals inclusive of non starchy vegetables 2 times a day 7 days a week Encouraged pt to choose lean protein sources: limiting beef, pork, sausage, hotdogs, and lunch meat Encourage pt to choose healthy fats such as plant based limiting animal fats Encouraged pt to continue to drink a minium 64 fluid ounces with half being plain water to satisfy proper hydration  Creation of balanced and diverse meals to increase the intake of nutrient-rich foods that provide essential vitamins, minerals, fiber, and phytonutrients Variety of Fruits and Vegetables: Aim for a colorful array of fruits and vegetables to ensure a wide range of nutrients. Include a mix of leafy greens, berries, citrus fruits, cruciferous vegetables, and more. Whole Grains: Choose whole grains over refined grains. Examples include brown rice, quinoa, oats, whole wheat, and barley. Lean Proteins: Include lean sources of protein, such as poultry, fish, tofu, legumes, beans, lentils, and low-fat dairy products. Limit red and processed meats. Healthy Fats: Incorporate sources of healthy fats, including avocados, nuts, seeds, and olive oil. Limit saturated and trans fats found in fried and processed foods. Dairy or Dairy Alternatives: Choose low-fat or fat-free dairy products, or plant-based alternatives like almond or soy milk. Portion Control: Be mindful of portion sizes to avoid overeating. Pay attention to hunger and satisfaction cues. Limit Added Sugars: Minimize the consumption of sugary beverages, snacks, and desserts. Check food labels for added sugars and opt for natural sources of sweetness such as whole fruits. Hydration: Drink plenty of water throughout the day. Limit sugary drinks and excessive caffeine intake. Moderate Sodium Intake: Reduce the consumption of high-sodium  foods. Use herbs and spices for flavor instead of excessive salt. Meal Planning and Preparation: Plan and prepare meals ahead of time to make healthier choices more convenient. Include a mix of food groups in each meal. Limit Processed Foods: Minimize the intake of highly processed and packaged foods that are often high in added sugars, salt, and unhealthy fats. Regular Physical Activity: Combine a healthy diet with regular physical activity for overall well-being. Aim for at least 150 minutes of moderate-intensity aerobic exercise per week, along with strength training. Moderation and Balance: Enjoy treats and indulgent foods in moderation, emphasizing balance rather than strict restriction.  Goals -cut back on the sweet tea and soda -increase water intake  -get back into your activity   Handouts Previously Provided Include  1 year handouts   Learning Style & Readiness for Change Teaching method utilized: Visual & Auditory  Demonstrated degree of understanding via: Teach Back  Readiness Level: action Barriers to learning/adherence to lifestyle change: unidentified   RD's Notes for Next Visit Assess adherence to pt chosen goals    MONITORING & EVALUATION Dietary intake, weekly physical activity, body weight  Next Steps Patient is to return in 6 months

## 2024-01-05 ENCOUNTER — Other Ambulatory Visit: Payer: Self-pay | Admitting: Family Medicine

## 2024-01-05 DIAGNOSIS — K219 Gastro-esophageal reflux disease without esophagitis: Secondary | ICD-10-CM

## 2024-01-11 DIAGNOSIS — M25871 Other specified joint disorders, right ankle and foot: Secondary | ICD-10-CM | POA: Diagnosis not present

## 2024-01-11 DIAGNOSIS — M19171 Post-traumatic osteoarthritis, right ankle and foot: Secondary | ICD-10-CM | POA: Diagnosis not present

## 2024-01-11 DIAGNOSIS — M5416 Radiculopathy, lumbar region: Secondary | ICD-10-CM | POA: Diagnosis not present

## 2024-01-14 DIAGNOSIS — J3089 Other allergic rhinitis: Secondary | ICD-10-CM | POA: Diagnosis not present

## 2024-01-14 DIAGNOSIS — H1045 Other chronic allergic conjunctivitis: Secondary | ICD-10-CM | POA: Diagnosis not present

## 2024-01-14 DIAGNOSIS — R052 Subacute cough: Secondary | ICD-10-CM | POA: Diagnosis not present

## 2024-01-19 ENCOUNTER — Ambulatory Visit: Admitting: Podiatry

## 2024-01-19 ENCOUNTER — Encounter: Payer: Self-pay | Admitting: Podiatry

## 2024-01-19 DIAGNOSIS — L600 Ingrowing nail: Secondary | ICD-10-CM

## 2024-01-19 MED ORDER — NEOMYCIN-POLYMYXIN-HC 3.5-10000-1 OT SUSP
OTIC | 0 refills | Status: DC
Start: 2024-01-19 — End: 2024-03-25

## 2024-01-19 NOTE — Patient Instructions (Signed)

## 2024-01-23 NOTE — Progress Notes (Signed)
  Subjective:  Patient ID: Sheri Malone, female    DOB: 1986/10/06,  MRN: [id number]  Chief Complaint  Patient presents with   Ingrown Toenail    Patient believes she has an ingrown toe nail on right 5th toe , patient states it hurts wearing shoes sometimes and this has been going on for a year now     38 y.o. female presents with the above complaint. History confirmed with patient.   Objective:  Physical Exam: warm, good capillary refill, no trophic changes or ulcerative lesions, normal DP and PT pulses, normal sensory exam, and right fifth toe lateral border Lister's corn and ingrown nail w/o infection  Assessment:   1. Ingrowing nail      Plan:  Patient was evaluated and treated and all questions answered.    Ingrown Nail, right -Patient elects to proceed with minor surgery to remove ingrown toenail w/ Lister's corn on right fifth toe today. Consent reviewed and signed by patient. -Ingrown nail excised. See procedure note. -Educated on post-procedure care including soaking. Written instructions provided and reviewed. -Rx for Cortisporin sent to pharmacy. -Advised on signs and symptoms of infection developing.  We discussed that the phenol likely will create some redness and edema and tenderness around the nailbed as long as it is localized this is to be expected.  Will return as needed if any infection signs develop  Procedure: Excision of Ingrown Toenail Location: Right 5th toe lateral nail border and Lister's corn. Anesthesia: Lidocaine 1% plain; 1.5 mL and Marcaine 0.5% plain; 1.5 mL, digital block. Skin Prep: Betadine. Dressing: Silvadene; telfa; dry, sterile, compression dressing. Technique: Following skin prep, the toe was exsanguinated and a tourniquet was secured at the base of the toe. The affected nail border was freed, split with a nail splitter, and excised. Chemical matrixectomy was then performed with phenol and irrigated out with alcohol. The tourniquet was  then removed and sterile dressing applied. Disposition: Patient tolerated procedure well.    Return if symptoms worsen or fail to improve.

## 2024-02-01 DIAGNOSIS — M5416 Radiculopathy, lumbar region: Secondary | ICD-10-CM | POA: Diagnosis not present

## 2024-02-10 DIAGNOSIS — M5416 Radiculopathy, lumbar region: Secondary | ICD-10-CM | POA: Diagnosis not present

## 2024-02-14 ENCOUNTER — Other Ambulatory Visit: Payer: Self-pay | Admitting: Family Medicine

## 2024-02-14 DIAGNOSIS — N921 Excessive and frequent menstruation with irregular cycle: Secondary | ICD-10-CM

## 2024-02-16 NOTE — Telephone Encounter (Signed)
 Requested Prescriptions  Pending Prescriptions Disp Refills   Norethindrone  Acetate-Ethinyl Estrad-FE (Sheri Malone 24 FE) 1-20 MG-MCG(24) tablet [Pharmacy Med Name: Sheri Malone 24 FE 1 MG-20 MCG TAB] 28 tablet 0    Sig: TAKE 1 TABLET BY MOUTH DAILY. TAKE HORMONAL PILL CONTINUOUSLY     OB/GYN:  Contraceptives Failed - 02/16/2024  9:15 AM      Failed - Valid encounter within last 12 months    Recent Outpatient Visits   None            Passed - Last BP in normal range    BP Readings from Last 1 Encounters:  01/01/24 131/88         Passed - Patient is not a smoker

## 2024-02-19 DIAGNOSIS — M5416 Radiculopathy, lumbar region: Secondary | ICD-10-CM | POA: Diagnosis not present

## 2024-03-01 ENCOUNTER — Other Ambulatory Visit: Payer: Self-pay | Admitting: Family Medicine

## 2024-03-01 DIAGNOSIS — N921 Excessive and frequent menstruation with irregular cycle: Secondary | ICD-10-CM

## 2024-03-01 DIAGNOSIS — K219 Gastro-esophageal reflux disease without esophagitis: Secondary | ICD-10-CM

## 2024-03-03 NOTE — Telephone Encounter (Signed)
 OFFICE VISIT NEEDED FOR ADDITIONAL REFILLS   Requested Prescriptions  Pending Prescriptions Disp Refills   HAILEY 24 FE 1-20 MG-MCG(24) tablet [Pharmacy Med Name: HAILEY 24 FE 1 MG-20 MCG TAB] 28 tablet 0    Sig: TAKE 1 TABLET BY MOUTH DAILY. TAKE HORMONAL PILL CONTINUOUSLY     OB/GYN:  Contraceptives Failed - 03/03/2024 12:25 PM      Failed - Valid encounter within last 12 months    Recent Outpatient Visits   None            Passed - Last BP in normal range    BP Readings from Last 1 Encounters:  01/01/24 131/88         Passed - Patient is not a smoker       pantoprazole  (PROTONIX ) 40 MG tablet [Pharmacy Med Name: PANTOPRAZOLE  SOD DR 40 MG TAB] 30 tablet 0    Sig: TAKE 1 TABLET (40 MG TOTAL) BY MOUTH IN THE MORNING     Gastroenterology: Proton Pump Inhibitors Failed - 03/03/2024 12:25 PM      Failed - Valid encounter within last 12 months    Recent Outpatient Visits   None

## 2024-03-04 DIAGNOSIS — M5416 Radiculopathy, lumbar region: Secondary | ICD-10-CM | POA: Diagnosis not present

## 2024-03-08 ENCOUNTER — Encounter (INDEPENDENT_AMBULATORY_CARE_PROVIDER_SITE_OTHER): Payer: Self-pay

## 2024-03-18 DIAGNOSIS — M5416 Radiculopathy, lumbar region: Secondary | ICD-10-CM | POA: Diagnosis not present

## 2024-03-21 ENCOUNTER — Ambulatory Visit: Admitting: Family Medicine

## 2024-03-25 ENCOUNTER — Ambulatory Visit (INDEPENDENT_AMBULATORY_CARE_PROVIDER_SITE_OTHER): Admitting: Family Medicine

## 2024-03-25 ENCOUNTER — Other Ambulatory Visit (HOSPITAL_COMMUNITY)
Admission: RE | Admit: 2024-03-25 | Discharge: 2024-03-25 | Disposition: A | Discharge status: Home / Self Care | Source: Ambulatory Visit | Attending: Family Medicine | Admitting: Family Medicine

## 2024-03-25 VITALS — BP 122/68 | HR 88 | Resp 16 | Ht 63.0 in | Wt 174.0 lb

## 2024-03-25 DIAGNOSIS — Z1231 Encounter for screening mammogram for malignant neoplasm of breast: Secondary | ICD-10-CM

## 2024-03-25 DIAGNOSIS — Z124 Encounter for screening for malignant neoplasm of cervix: Secondary | ICD-10-CM

## 2024-03-25 DIAGNOSIS — M5416 Radiculopathy, lumbar region: Secondary | ICD-10-CM | POA: Diagnosis not present

## 2024-03-25 DIAGNOSIS — K219 Gastro-esophageal reflux disease without esophagitis: Secondary | ICD-10-CM | POA: Diagnosis not present

## 2024-03-25 DIAGNOSIS — N951 Menopausal and female climacteric states: Secondary | ICD-10-CM | POA: Diagnosis not present

## 2024-03-25 DIAGNOSIS — N921 Excessive and frequent menstruation with irregular cycle: Secondary | ICD-10-CM | POA: Diagnosis not present

## 2024-03-25 DIAGNOSIS — R7303 Prediabetes: Secondary | ICD-10-CM

## 2024-03-25 DIAGNOSIS — Z0000 Encounter for general adult medical examination without abnormal findings: Secondary | ICD-10-CM

## 2024-03-25 DIAGNOSIS — E559 Vitamin D deficiency, unspecified: Secondary | ICD-10-CM | POA: Diagnosis not present

## 2024-03-25 DIAGNOSIS — G4700 Insomnia, unspecified: Secondary | ICD-10-CM

## 2024-03-25 DIAGNOSIS — Z9884 Bariatric surgery status: Secondary | ICD-10-CM

## 2024-03-25 MED ORDER — HAILEY 24 FE 1-20 MG-MCG(24) PO TABS: ORAL_TABLET | ORAL | 5 refills | Status: AC

## 2024-03-25 NOTE — Progress Notes (Signed)
 Patient: Sheri Malone, Female    DOB: 05-31-1986, 38 y.o.   MRN: [id number] Adeline Hone, PA-C Visit Date: 03/25/2024  Today's Provider: Adeline Hone, PA-C   Chief Complaint  Patient presents with   Annual Exam   Subjective:   Annual physical exam:  Sheri Malone is a 38 y.o. female who presents today for complete physical exam:  Exercise/Activity:  active/exercising - see SDOH  Diet/nutrition:  continues with bariatric and nutritionist and eats healthy  SDOH Screenings   Food Insecurity: No Food Insecurity (03/25/2024)  Housing: Low Risk  (03/25/2024)  Transportation Needs: No Transportation Needs (03/25/2024)  Utilities: Not At Risk (02/06/2023)  Alcohol Screen: Low Risk  (03/25/2024)  Depression (PHQ2-9): Low Risk  (03/25/2024)  Financial Resource Strain: Low Risk  (03/25/2024)  Physical Activity: Sufficiently Active (03/25/2024)  Social Connections: Socially Integrated (03/25/2024)  Stress: No Stress Concern Present (03/25/2024)  Tobacco Use: Low Risk  (03/25/2024)  Health Literacy: Adequate Health Literacy (03/25/2024)     USPSTF grade A and B recommendations - reviewed and addressed today  Depression:  Phq 9 completed today by patient, was reviewed by me with patient in the room PHQ score is neg, pt feels good    03/25/2024    1:47 PM 02/06/2023    3:03 PM 09/19/2022    2:50 PM 04/01/2022    9:33 AM  PHQ 2/9 Scores  PHQ - 2 Score 0 0 0 0  PHQ- 9 Score  0 0 0      03/25/2024    1:47 PM 02/06/2023    3:03 PM 09/19/2022    2:50 PM 04/01/2022    9:33 AM 02/04/2022    8:25 AM  Depression screen PHQ 2/9  Decreased Interest 0 0 0 0 0  Down, Depressed, Hopeless 0 0 0 0 0  PHQ - 2 Score 0 0 0 0 0  Altered sleeping  0 0 0 0  Tired, decreased energy  0 0 0 0  Change in appetite  0 0 0 0  Feeling bad or failure about yourself   0 0 0 0  Trouble concentrating  0 0 0 0  Moving slowly or fidgety/restless  0 0 0 0  Suicidal thoughts  0 0 0 0  PHQ-9 Score  0 0 0 0  Difficult doing  work/chores  Not difficult at all Not difficult at all Not difficult at all Not difficult at all    Alcohol screening: Flowsheet Row Office Visit from 03/25/2024 in [facility]  AUDIT-C Score 2        Immunizations and Health Maintenance: Health Maintenance  Topic Date Due   Diabetic kidney evaluation - Urine ACR  Never done   Diabetic kidney evaluation - eGFR measurement  09/20/2023   Cervical Cancer Screening (HPV/Pap Cotest)  09/21/2023   COVID-19 Vaccine (8 - Pfizer risk 2024-25 season) 04/09/2024 (Originally 02/28/2024)   INFLUENZA VACCINE  05/20/2024   DTaP/Tdap/Td (2 - Td or Tdap) 01/16/2026   Pneumococcal Vaccine 14-5 Years old  Completed   Hepatitis C Screening  Completed   HIV Screening  Completed   HPV VACCINES  Aged Out   Meningococcal B Vaccine  Aged Out     Hep C Screening: done previously  STD testing and prevention (HIV/chl/gon/syphilis):  see above, no additional testing desired by pt today - low risk  Intimate partner violence:denies  Sexual History/Pain during Intercourse: Married  Menstrual History/LMP/Abnormal Bleeding:  No LMP recorded. (Menstrual status:  Irregular Periods).  Incontinence Symptoms:  none  Breast cancer: due   Cervical cancer screening: due by 2026- she will complete today   Osteoporosis:   Discussion on osteoporosis per age, including high calcium  and vitamin D  supplementation, weight bearing exercises Pt is not supplementing with daily calcium /Vit D.  Skin cancer:  Hx of skin CA -  NO Discussed atypical lesions   Colorectal cancer:   Colonoscopy is not due per age Discussed concerning signs and sx of CRC, pt denies any concerning sx/bowel changes  Lung cancer:   Low Dose CT Chest recommended if Age 32-80 years, 20 pack-year currently smoking OR have quit w/in 15years. Patient does not qualify.    Social History   Tobacco Use   Smoking status: Never   Smokeless tobacco: Never  Vaping Use    Vaping status: Never Used  Substance Use Topics   Alcohol use: Yes    Comment: occasionally/once a month   Drug use: No     Flowsheet Row Office Visit from 03/25/2024 in [facility]  AUDIT-C Score 2        Family History  Problem Relation Age of Onset   Migraines Mother    Hypertension Mother    Breast cancer Mother 58   Stroke Father    Diabetes Father    Hypertension Father    Multiple sclerosis Sister    Cancer Maternal Grandmother    Cancer Maternal Grandfather        prostate   Heart disease Paternal Grandfather    Stomach cancer Neg Hx    Rectal cancer Neg Hx    Esophageal cancer Neg Hx    Colon cancer Neg Hx      Blood pressure/Hypertension: BP Readings from Last 3 Encounters:  03/25/24 122/68  01/01/24 131/88  05/25/23 119/80    Weight/Obesity: Wt Readings from Last 3 Encounters:  03/25/24 174 lb (78.9 kg)  01/04/24 173 lb 4.8 oz (78.6 kg)  06/30/23 166 lb 12.8 oz (75.7 kg)   BMI Readings from Last 3 Encounters:  03/25/24 30.82 kg/m  01/04/24 30.70 kg/m  06/30/23 29.55 kg/m     Lipids:  Lab Results  Component Value Date   CHOL 160 02/06/2023   CHOL 135 05/08/2022   CHOL 138 01/27/2014   Lab Results  Component Value Date   HDL 55 02/06/2023   HDL 48 (L) 05/08/2022   HDL 42 01/27/2014   Lab Results  Component Value Date   LDLCALC 80 02/06/2023   LDLCALC 66 05/08/2022   LDLCALC 58 01/27/2014   Lab Results  Component Value Date   TRIG 149 02/06/2023   TRIG 120 05/08/2022   TRIG 188 (A) 01/27/2014   Lab Results  Component Value Date   CHOLHDL 2.9 02/06/2023   CHOLHDL 2.8 05/08/2022   No results found for: LDLDIRECT Based on the results of lipid panel his/her cardiovascular risk factor ( using Po[place] Cohort )  in the next 10 years is: The 10-year ASCVD risk score (Arnett DK, et al., 2019) is: 0.4%   Values used to calculate the score:     Age: 58 years     Sex: Female     Is Non-Hispanic African  American: Yes     Diabetic: No     Tobacco smoker: No     Systolic Blood Pressure: 122 mmHg     Is BP treated: No     HDL Cholesterol: 55 mg/dL     Total Cholesterol: 160  mg/dL  Glucose:  Glucose, Bld  Date Value Ref Range Status  09/19/2022 81 65 - 99 mg/dL Final    Comment:    .            Fasting reference interval .   07/08/2022 154 (H) 70 - 99 mg/dL Final    Comment:    Glucose reference range applies only to samples taken after fasting for at least 8 hours.  06/26/2022 87 70 - 99 mg/dL Final    Comment:    Glucose reference range applies only to samples taken after fasting for at least 8 hours.    Advanced Care Planning:  A voluntary discussion about advance care planning including the explanation and discussion of advance directives.   Discussed health care proxy and Living will, and the patient was able to identify a health care proxy as [place].   Patient does not have a living will at present time.   Social History       Social History   Socioeconomic History   Marital status: Married    Spouse name: Shellye Dibble   Number of children: 2   Years of education: Not on file   Highest education level: Master's degree (e.g., MA, MS, MEng, MEd, MSW, MBA)  Occupational History   Not on file  Tobacco Use   Smoking status: Never   Smokeless tobacco: Never  Vaping Use   Vaping status: Never Used  Substance and Sexual Activity   Alcohol use: Yes    Comment: occasionally/once a month   Drug use: No   Sexual activity: Yes    Partners: Male    Birth control/protection: Pill  Other Topics Concern   Not on file  Social History Narrative   Lives at home with husband and daughter and son.   Right handed   Social Drivers of Health   Financial Resource Strain: Low Risk  (03/25/2024)   Overall Financial Resource Strain (CARDIA)    Difficulty of Paying Living Expenses: Not very hard  Food Insecurity: No Food Insecurity (03/25/2024)   Hunger Vital Sign    Worried About  Running Out of Food in the Last Year: Never true    Ran Out of Food in the Last Year: Never true  Transportation Needs: No Transportation Needs (03/25/2024)   PRAPARE - Administrator, Civil Service (Medical): No    Lack of Transportation (Non-Medical): No  Physical Activity: Sufficiently Active (03/25/2024)   Exercise Vital Sign    Days of Exercise per Week: 7 days    Minutes of Exercise per Session: 30 min  Stress: No Stress Concern Present (03/25/2024)   [employer] of Occupational Health - Occupational Stress Questionnaire    Feeling of Stress : Only a little  Social Connections: Socially Integrated (03/25/2024)   Social Connection and Isolation Panel [NHANES]    Frequency of Communication with Friends and Family: More than three times a week    Frequency of Social Gatherings with Friends and Family: Once a week    Attends Religious Services: More than 4 times per year    Active Member of [employer] or Organizations: Yes    Attends Engineer, structural: More than 4 times per year    Marital Status: Married    Family History        Family History  Problem Relation Age of Onset   Migraines Mother    Hypertension Mother    Breast cancer Mother 41   Stroke Father  Diabetes Father    Hypertension Father    Multiple sclerosis Sister    Cancer Maternal Grandmother    Cancer Maternal Grandfather        prostate   Heart disease Paternal Grandfather    Stomach cancer Neg Hx    Rectal cancer Neg Hx    Esophageal cancer Neg Hx    Colon cancer Neg Hx     Patient Active Problem List   Diagnosis Date Noted   Superior mesenteric artery stenosis (HCC) 05/11/2023   S/P gastric bypass 07/07/2022   Left lower lobe pneumonia 03/30/2022   SIRS, possible sepsis (systemic inflammatory response syndrome) (HCC) 03/30/2022   S/P coil embolization of splenic artery aneurysm 03/25/22 03/30/2022   Vertigo 11/24/2021   BPPV (benign paroxysmal positional vertigo), left 11/15/2021    Splenic artery aneurysm (HCC) 08/06/2021   Effusion of right ankle 07/31/2021   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   Iron  deficiency anemia 11/15/2020   Slow transit constipation 11/15/2020   Microcytosis 11/15/2020   Arthralgia of right ankle 08/15/2020   Stiffness of right ankle joint 08/15/2020   Closed trimalleolar fracture of right ankle 05/21/2020   Gastro-esophageal reflux disease without esophagitis 04/13/2020   Obesity, Class III, BMI 40-49.9 (morbid obesity) 01/20/2020   Vitamin D  deficiency 01/20/2020   Prediabetes 01/20/2020   Insulin resistance 01/20/2020   Metabolic syndrome 01/20/2020   Allergic rhinitis 11/23/2016   Allergic conjunctivitis 01/17/2016   Mild intermittent asthma 01/17/2016   Dermatitis, eczematoid 06/21/2015   H/O cesarean section 06/21/2015   History of diabetes mellitus arising in pregnancy 06/21/2015   Headache, migraine 06/21/2015    Past Surgical History:  Procedure Laterality Date   ANKLE FRACTURE SURGERY Right    x2   CESAREAN SECTION N/A 05/20/2013   Procedure: CESAREAN SECTION;  Surgeon: Percy Bracken, MD;  Location: [facility] ORS;  Service: Obstetrics;  Laterality: N/A;  primary   CESAREAN SECTION N/A 02/04/2018   Procedure: Repeat CESAREAN SECTION;  Surgeon: Audelia Leaks, MD;  Location: [facility] BIRTHING SUITES;  Service: Obstetrics;  Laterality: N/A;  EDD: 02/16/18   CHOLECYSTECTOMY N/A 09/28/2018   Procedure: LAPAROSCOPIC CHOLECYSTECTOMY;  Surgeon: Oza Blumenthal, MD;  Location: [facility] OR;  Service: General;  Laterality: N/A;   DILATION AND CURETTAGE OF UTERUS  10/21/2007   GASTRIC ROUX-EN-Y N/A 07/07/2022   Procedure: LAPAROSCOPIC ROUX-EN-Y GASTRIC BYPASS WITH UPPER ENDOSCOPY;  Surgeon: Aldean Hummingbird, MD;  Location: [facility] ORS;  Service: General;  Laterality: N/A;   HIATAL HERNIA REPAIR N/A 07/07/2022   Procedure: HERNIA REPAIR HIATAL;  Surgeon: Aldean Hummingbird, MD;  Location: [facility] ORS;  Service: General;  Laterality: N/A;   SALPINGOOPHORECTOMY   10/21/2007   left   UNILATERAL SALPINGECTOMY Right 02/04/2018   Procedure: UNILATERAL SALPINGECTOMY;  Surgeon: Audelia Leaks, MD;  Location: [facility] BIRTHING SUITES;  Service: Obstetrics;  Laterality: Right;   VISCERAL ARTERY INTERVENTION N/A 03/25/2022   Procedure: VISCERAL ARTERY INTERVENTION;  Surgeon: Jackquelyn Mass, MD;  Location: [facility] INVASIVE CV LAB;  Service: Cardiovascular;  Laterality: N/A;   WISDOM TOOTH EXTRACTION       Current Outpatient Medications:    albuterol  (PROVENTIL  HFA;VENTOLIN  HFA) 108 (90 Base) MCG/ACT inhaler, Inhale 1 puff into the lungs every 6 (six) hours as needed for wheezing or shortness of breath., Disp: , Rfl:    azelastine  (ASTELIN ) 0.1 % nasal spray, Place 1 spray into both nostrils daily., Disp: , Rfl:    azelastine  (OPTIVAR ) 0.05 % ophthalmic solution, Place 1 drop into both eyes daily as  needed (Allergies)., Disp: , Rfl:    EPINEPHrine  0.3 mg/0.3 mL IJ SOAJ injection, Inject 0.3 mg into the muscle as needed for anaphylaxis., Disp: , Rfl:    fluticasone  (FLONASE ) 50 MCG/ACT nasal spray, Place 1 spray into the nose daily as needed for allergies., Disp: , Rfl:    levocetirizine (XYZAL) 5 MG tablet, Take 5 mg by mouth every evening., Disp: , Rfl:    meclizine  (ANTIVERT ) 25 MG tablet, Take 12.5-25 mg by mouth 3 (three) times daily as needed for dizziness., Disp: , Rfl:    montelukast (SINGULAIR) 10 MG tablet, Take 10 mg by mouth at bedtime., Disp: , Rfl:    Norethindrone  Acetate-Ethinyl Estrad-FE (HAILEY  24 FE) 1-20 MG-MCG(24) tablet, TAKE 1 TABLET BY MOUTH DAILY. TAKE HORMONAL PILL CONTINUOUSLY, Disp: 28 tablet, Rfl: 0   ondansetron  (ZOFRAN ) 4 MG tablet, Take 1 tablet (4 mg total) by mouth every 8 (eight) hours as needed for nausea or vomiting., Disp: 20 tablet, Rfl: 0   pantoprazole  (PROTONIX ) 40 MG tablet, TAKE 1 TABLET (40 MG TOTAL) BY MOUTH IN THE MORNING, Disp: 30 tablet, Rfl: 0   SYMBICORT  160-4.5 MCG/ACT inhaler, INHALE 2 PUFFS INTO THE LUNGS IN THE  MORNING AND AT BEDTIME., Disp: 10.2 each, Rfl: 1  Allergies  Allergen Reactions   Dust Mite Mixed Allergen Ext [Mite (D. Farinae)] Anaphylaxis   Other Anaphylaxis and Other (See Comments)    DUST ROACH   Shellfish Allergy     Patient Care Team: Adeline Hone, PA-C as PCP - General (Family Medicine) Constancia Delton, MD as PCP - Cardiology (Cardiology) Zara Heymann, MD as Referring Physician (Allergy) Adeline Hone, PA-C (Family Medicine)   Chart Review: I personally reviewed active problem list, medication list, allergies, family history, social history, health maintenance, notes from last encounter, lab results, imaging with the patient/caregiver today.   Review of Systems  Constitutional: Negative.   HENT: Negative.    Eyes: Negative.   Respiratory: Negative.    Cardiovascular: Negative.   Gastrointestinal: Negative.   Endocrine: Negative.   Genitourinary: Negative.   Musculoskeletal: Negative.   Skin: Negative.   Allergic/Immunologic: Negative.   Neurological: Negative.   Hematological: Negative.   Psychiatric/Behavioral: Negative.    All other systems reviewed and are negative.         Objective:   Vitals:  Vitals:   03/25/24 1347  BP: 122/68  Pulse: 88  Resp: 16  SpO2: 98%  Weight: 174 lb (78.9 kg)  Height: 5' 3 (1.6 m)    Body mass index is 30.82 kg/m.  Physical Exam Vitals and nursing note reviewed. Exam conducted with a chaperone present.  Constitutional:      General: She is not in acute distress.    Appearance: Normal appearance. She is well-developed, well-groomed and overweight. She is not ill-appearing, toxic-appearing or diaphoretic.  HENT:     Head: Normocephalic and atraumatic.     Right Ear: Tympanic membrane, ear canal and external ear normal. There is no impacted cerumen.     Left Ear: Tympanic membrane, ear canal and external ear normal. There is no impacted cerumen.     Nose: Nose normal. No congestion or rhinorrhea.      Mouth/Throat:     Pharynx: Oropharynx is clear. No oropharyngeal exudate or posterior oropharyngeal erythema.  Eyes:     General: No scleral icterus.       Right eye: No discharge.        Left eye: No discharge.     Conjunctiva/sclera:  Conjunctivae normal.  Neck:     Trachea: No tracheal deviation.  Cardiovascular:     Rate and Rhythm: Normal rate.     Pulses: Normal pulses.     Heart sounds: Normal heart sounds.  Pulmonary:     Effort: Pulmonary effort is normal. No respiratory distress.     Breath sounds: Normal breath sounds. No stridor. No wheezing, rhonchi or rales.  Genitourinary:    General: Normal vulva.     Vagina: Normal.     Cervix: Friability present. No lesion, erythema, cervical bleeding or eversion.     Uterus: Normal.      Adnexa: Right adnexa normal and left adnexa normal.     Comments: PAP obtained with brush, unable to fully visualize cervical os due - slightly technically difficult  Some discharge Skin:    General: Skin is warm and dry.     Findings: No rash.  Neurological:     Mental Status: She is alert. Mental status is at baseline.     Motor: No abnormal muscle tone.     Coordination: Coordination normal.     Gait: Gait normal.  Psychiatric:        Mood and Affect: Mood normal.        Behavior: Behavior normal. Behavior is cooperative.       Fall Risk:    03/25/2024    1:47 PM 02/06/2023    3:03 PM 09/19/2022    2:50 PM 04/01/2022    9:33 AM 02/04/2022    8:25 AM  Fall Risk   Falls in the past year? 0 0 0 0 1  Number falls in past yr: 0 0 0 0 0  Injury with Fall? 0 0 0 0 1  Risk for fall due to : No Fall Risks No Fall Risks No Fall Risks No Fall Risks Impaired balance/gait  Follow up Falls prevention discussed Falls prevention discussed;Falls evaluation completed;Education provided Falls prevention discussed;Education provided;Falls evaluation completed Falls prevention discussed;Education provided Education provided;Falls prevention discussed     Functional Status Survey: Is the patient deaf or have difficulty hearing?: No Does the patient have difficulty seeing, even when wearing glasses/contacts?: No Does the patient have difficulty concentrating, remembering, or making decisions?: No Does the patient have difficulty walking or climbing stairs?: No Does the patient have difficulty dressing or bathing?: No Does the patient have difficulty doing errands alone such as visiting a doctor's office or shopping?: No   Assessment & Plan:    CPE completed today  USPSTF grade A and B recommendations reviewed with patient; age-appropriate recommendations, preventive care, screening tests, etc discussed and encouraged; healthy living encouraged; see AVS for patient education given to patient  Discussed importance of 150 minutes of physical activity weekly, AHA exercise recommendations given to pt in AVS/handout  Discussed importance of healthy diet:  eating lean meats and proteins, avoiding trans fats and saturated fats, avoid simple sugars and excessive carbs in diet, eat 6 servings of fruit/vegetables daily and drink plenty of water  and avoid sweet beverages.    Recommended pt to do annual eye exam and routine dental exams/cleanings  Depression, alcohol, fall screening completed as documented above and per flowsheets  Advance Care planning information and packet discussed and offered today, encouraged pt to discuss with family members/spouse/partner/friends and complete Advanced directive packet and bring copy to office   Reviewed Health Maintenance: Health Maintenance  Topic Date Due   Diabetic kidney evaluation - Urine ACR  Never done   Diabetic kidney  evaluation - eGFR measurement  09/20/2023   Cervical Cancer Screening (HPV/Pap Cotest)  09/21/2023   COVID-19 Vaccine (8 - Pfizer risk 2024-25 season) 04/09/2024 (Originally 02/28/2024)   INFLUENZA VACCINE  05/20/2024   DTaP/Tdap/Td (2 - Td or Tdap) 01/16/2026   Pneumococcal  Vaccine 64-62 Years old  Completed   Hepatitis C Screening  Completed   HIV Screening  Completed   HPV VACCINES  Aged Out   Meningococcal B Vaccine  Aged Out    Immunizations: Immunization History  Administered Date(s) Administered   Influenza, Mdck, Trivalent,PF 6+ MOS(egg free) 08/31/2023   Influenza, Seasonal, Injecte, Preservative Fre 07/22/2017   Influenza,inj,Quad PF,6+ Mos 09/27/2015, 08/05/2019, 07/16/2020, 08/12/2022   Influenza,inj,quad, With Preservative 07/21/2019   PFIZER(Purple Top)SARS-COV-2 Vaccination 11/15/2019, 12/06/2019, 07/05/2020, 07/18/2020, 03/01/2021   PNEUMOCOCCAL CONJUGATE-20 03/25/2022   Pfizer(Comirnaty)Fall Seasonal Vaccine 12 years and older 08/12/2022, 08/31/2023   Tdap 01/17/2016   Vaccines:  HPV: up to at age 70 , ask insurance if age between 57-45  Shingrix: not due per age 28-64 yo and ask insurance if covered when patient above 21 yo Pneumonia: had due to asthma educated and discussed with patient. Flu: annually  educated and discussed with patient.     ICD-10-CM   1. Well adult exam  Z00.00 CBC with Differential/Platelet    Comprehensive Metabolic Panel (CMET)    Lipid Profile    HgB A1c    TSH    Urine Microalbumin w/creat. ratio    2. Prediabetes  R73.03 Comprehensive Metabolic Panel (CMET)    HgB A1c    Urine Microalbumin w/creat. ratio   hx of diabetes before weight loss surgery and sig weight loss - hsa been in prediabetic range to normal for several years    3. Cervical cancer screening  Z12.4 Cytology - PAP    4. Vitamin D  deficiency  E55.9 VITAMIN D  25 Hydroxy (Vit-D Deficiency, Fractures)    VITAMIN D  25 Hydroxy (Vit-D Deficiency, Fractures)    5. Menorrhagia with irregular cycle  N92.1 CBC with Differential/Platelet    Norethindrone  Acetate-Ethinyl Estrad-FE (HAILEY  24 FE) 1-20 MG-MCG(24) tablet   refill on OCP    6. Encounter for screening mammogram for malignant neoplasm of breast  Z12.31 MM 3D SCREENING MAMMOGRAM  BILATERAL BREAST    7. S/P gastric bypass  Z98.84 CBC with Differential/Platelet    Comprehensive Metabolic Panel (CMET)    VITAMIN D  25 Hydroxy (Vit-D Deficiency, Fractures)    VITAMIN D  25 Hydroxy (Vit-D Deficiency, Fractures)   supplements per bariatric specialists    8. Gastro-esophageal reflux disease without esophagitis  K21.9    refill on protonix  ordered          Adeline Hone, PA-C 03/25/24 2:02 PM  [facility]

## 2024-03-25 NOTE — Assessment & Plan Note (Signed)
 Last vitamin D Lab Results  Component Value Date   VD25OH 49 04/13/2020

## 2024-03-26 LAB — MICROALBUMIN / CREATININE URINE RATIO
Creatinine, Urine: 203 mg/dL (ref 20–275)
Microalb Creat Ratio: 1 mg/g{creat} (ref <30)
Microalb, Ur: 0.3 mg/dL

## 2024-03-26 LAB — CBC WITH DIFFERENTIAL/PLATELET
Absolute Lymphocytes: 3233 {cells}/uL (ref 850–3900)
Absolute Monocytes: 488 {cells}/uL (ref 200–950)
Basophils Absolute: 68 {cells}/uL (ref 0–200)
Basophils Relative: 0.9 %
Eosinophils Absolute: 113 {cells}/uL (ref 15–500)
Eosinophils Relative: 1.5 %
HCT: 38.5 % (ref 35.0–45.0)
Hemoglobin: 12.3 g/dL (ref 11.7–15.5)
MCH: 27 pg (ref 27.0–33.0)
MCHC: 31.9 g/dL — ABNORMAL LOW (ref 32.0–36.0)
MCV: 84.6 fL (ref 80.0–100.0)
MPV: 9.7 fL (ref 7.5–12.5)
Monocytes Relative: 6.5 %
Neutro Abs: 3600 {cells}/uL (ref 1500–7800)
Neutrophils Relative %: 48 %
Platelets: 333 10*3/uL (ref 140–400)
RBC: 4.55 10*6/uL (ref 3.80–5.10)
RDW: 12.4 % (ref 11.0–15.0)
Total Lymphocyte: 43.1 %
WBC: 7.5 10*3/uL (ref 3.8–10.8)

## 2024-03-26 LAB — COMPREHENSIVE METABOLIC PANEL WITH GFR
AG Ratio: 1.6 (calc) (ref 1.0–2.5)
ALT: 11 U/L (ref 6–29)
AST: 13 U/L (ref 10–30)
Albumin: 4.2 g/dL (ref 3.6–5.1)
Alkaline phosphatase (APISO): 31 U/L (ref 31–125)
BUN: 9 mg/dL (ref 7–25)
CO2: 27 mmol/L (ref 20–32)
Calcium: 9.7 mg/dL (ref 8.6–10.2)
Chloride: 104 mmol/L (ref 98–110)
Creat: 0.98 mg/dL (ref 0.50–0.99)
Globulin: 2.6 g/dL (ref 1.9–3.7)
Glucose, Bld: 89 mg/dL (ref 65–99)
Potassium: 4.1 mmol/L (ref 3.5–5.3)
Sodium: 139 mmol/L (ref 135–146)
Total Bilirubin: 0.5 mg/dL (ref 0.2–1.2)
Total Protein: 6.8 g/dL (ref 6.1–8.1)
eGFR: 75 mL/min/{1.73_m2} (ref >=60)

## 2024-03-26 LAB — LIPID PANEL
Cholesterol: 157 mg/dL (ref <200)
HDL: 66 mg/dL (ref >=50)
LDL Cholesterol (Calc): 71 mg/dL
Non-HDL Cholesterol (Calc): 91 mg/dL (ref <130)
Total CHOL/HDL Ratio: 2.4 (calc) (ref <5.0)
Triglycerides: 122 mg/dL (ref <150)

## 2024-03-26 LAB — HEMOGLOBIN A1C
Hgb A1c MFr Bld: 5.6 % (ref <5.7)
Mean Plasma Glucose: 114 mg/dL
eAG (mmol/L): 6.3 mmol/L

## 2024-03-26 LAB — TSH: TSH: 0.73 m[IU]/L

## 2024-03-26 LAB — VITAMIN D 25 HYDROXY (VIT D DEFICIENCY, FRACTURES): Vit D, 25-Hydroxy: 48 ng/mL (ref 30–100)

## 2024-03-27 ENCOUNTER — Other Ambulatory Visit: Payer: Self-pay | Admitting: Family Medicine

## 2024-03-27 DIAGNOSIS — K219 Gastro-esophageal reflux disease without esophagitis: Secondary | ICD-10-CM

## 2024-03-28 ENCOUNTER — Ambulatory Visit: Payer: Self-pay | Admitting: Family Medicine

## 2024-03-29 ENCOUNTER — Other Ambulatory Visit: Payer: Self-pay

## 2024-03-29 ENCOUNTER — Ambulatory Visit: Admitting: Family Medicine

## 2024-03-29 DIAGNOSIS — K219 Gastro-esophageal reflux disease without esophagitis: Secondary | ICD-10-CM

## 2024-03-29 MED ORDER — PANTOPRAZOLE SODIUM 40 MG PO TBEC: 40.0000 mg | DELAYED_RELEASE_TABLET | Freq: Every day | ORAL | 1 refills | Status: DC

## 2024-03-29 NOTE — Telephone Encounter (Signed)
 Too soon for refill, duplicate request.  Requested Prescriptions  Pending Prescriptions Disp Refills   pantoprazole  (PROTONIX ) 40 MG tablet [Pharmacy Med Name: PANTOPRAZOLE  SOD DR 40 MG TAB] 90 tablet 1    Sig: TAKE 1 TABLET (40 MG TOTAL) BY MOUTH IN THE MORNING     Gastroenterology: Proton Pump Inhibitors Failed - 03/29/2024  9:08 AM      Failed - Valid encounter within last 12 months    Recent Outpatient Visits           4 days ago Well adult exam   [facility] Adeline Hone, PA-C       Future Appointments             In 12 months Tapia, Leisa, PA-C [facility]

## 2024-03-30 ENCOUNTER — Encounter: Payer: Self-pay | Admitting: Family Medicine

## 2024-03-30 LAB — CYTOLOGY - PAP
Comment: NEGATIVE
Diagnosis: NEGATIVE
Diagnosis: REACTIVE
High risk HPV: NEGATIVE

## 2024-05-27 ENCOUNTER — Other Ambulatory Visit (INDEPENDENT_AMBULATORY_CARE_PROVIDER_SITE_OTHER): Payer: Self-pay | Admitting: Vascular Surgery

## 2024-05-27 DIAGNOSIS — I728 Aneurysm of other specified arteries: Secondary | ICD-10-CM

## 2024-05-29 NOTE — Progress Notes (Unsigned)
 MRN : [id number]  Sheri Malone is a 38 y.o. (Mar 12, 1986) female who presents with chief complaint of check circulation.  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 Embolization of the splenic artery on 03/25/2022 The patient denies interval development of abdominal or back pain. No new lower extremity pain or discoloration of the toes.  She notes the flank pain that she was experiencing previously is improved.  It is very intermittent and has become much less frequent.   There is no recent history of TIA symptoms or focal motor deficits. The patient denies PAD or claudication symptoms.    CT angiography of the abdomen and pelvis dated 05/18/2023 shows a well treated splenic artery aneurysm with preservation of approximately 50% of the spleen.  There is no evidence of celiac or superior mesenteric artery stenosis.  The arteries appear completely normal.  Duplex ultrasound of the mesenteric system done today demonstrates mild elevations at the origin of the celiac artery.  The splenic artery aneurysm remains well treated.  No outpatient medications have been marked as taking for the 05/30/24 encounter (Appointment) with Jama, Cordella MATSU, MD.    Past Medical History:  Diagnosis Date   Abnormal Pap smear    Arthritis    ankle   Asthma    GERD (gastroesophageal reflux disease)    Gestational HTN    Headache(784.0)    History of ectopic pregnancy    History of gestational diabetes    Low hemoglobin    on ironspan   Pneumonia    Rhinitis    Vaginal Pap smear, abnormal     Past Surgical History:  Procedure Laterality Date   ANKLE FRACTURE SURGERY Right    x2   CESAREAN SECTION N/A 05/20/2013   Procedure: CESAREAN SECTION;  Surgeon: Marie-Lyne Lavoie, MD;  Location: [facility]  ORS;  Service: Obstetrics;  Laterality: N/A;  primary   CESAREAN SECTION N/A 02/04/2018   Procedure: Repeat CESAREAN SECTION;  Surgeon: Kandyce Sor, MD;  Location: [facility] BIRTHING SUITES;  Service: Obstetrics;  Laterality: N/A;  EDD: 02/16/18   CHOLECYSTECTOMY N/A 09/28/2018   Procedure: LAPAROSCOPIC CHOLECYSTECTOMY;  Surgeon: Vernetta Berg, MD;  Location: [facility] OR;  Service: General;  Laterality: N/A;   DILATION AND CURETTAGE OF UTERUS  10/21/2007   GASTRIC ROUX-EN-Y N/A 07/07/2022   Procedure: LAPAROSCOPIC ROUX-EN-Y GASTRIC BYPASS WITH UPPER ENDOSCOPY;  Surgeon: Tanda Locus, MD;  Location: [facility] ORS;  Service: General;  Laterality: N/A;   HIATAL HERNIA REPAIR N/A 07/07/2022   Procedure: HERNIA REPAIR HIATAL;  Surgeon: Tanda Locus, MD;  Location: [facility] ORS;  Service: General;  Laterality: N/A;   SALPINGOOPHORECTOMY  10/21/2007   left   UNILATERAL SALPINGECTOMY Right 02/04/2018   Procedure: UNILATERAL SALPINGECTOMY;  Surgeon: Kandyce Sor, MD;  Location: [facility] BIRTHING SUITES;  Service: Obstetrics;  Laterality: Right;   VISCERAL ARTERY INTERVENTION N/A 03/25/2022   Procedure: VISCERAL ARTERY INTERVENTION;  Surgeon: Jama Cordella MATSU, MD;  Location: [facility] INVASIVE CV LAB;  Service: Cardiovascular;  Laterality: N/A;   WISDOM TOOTH EXTRACTION      Social History Social History   Tobacco Use   Smoking status: Never   Smokeless tobacco: Never  Vaping Use   Vaping status: Never Used  Substance Use Topics   Alcohol use: Yes    Comment: occasionally/once a month   Drug use: No    Family History Family History  Problem Relation Age of Onset   Migraines Mother    Hypertension Mother    Breast cancer Mother 30   Stroke Father    Diabetes Father    Hypertension Father    Multiple sclerosis Sister    Cancer Maternal Grandmother    Cancer Maternal Grandfather        prostate   Heart disease Paternal Grandfather    Stomach cancer Neg Hx    Rectal cancer Neg Hx    Esophageal cancer Neg Hx     Colon cancer Neg Hx     Allergies  Allergen Reactions   Dust Mite Mixed Allergen Ext [Mite (D. Farinae)] Anaphylaxis   Other Anaphylaxis and Other (See Comments)    DUST ROACH   Shellfish Allergy      REVIEW OF SYSTEMS (Negative unless checked)  Constitutional: [] Weight loss  [] Fever  [] Chills Cardiac: [] Chest pain   [] Chest pressure   [] Palpitations   [] Shortness of breath when laying flat   [] Shortness of breath with exertion. Vascular:  [] Pain in legs with walking   [] Pain in legs at rest  [] History of DVT   [] Phlebitis   [] Swelling in legs   [] Varicose veins   [] Non-healing ulcers Pulmonary:   [] Uses home oxygen   [] Productive cough   [] Hemoptysis   [] Wheeze  [] COPD   [x] Asthma Neurologic:  [] Dizziness   [] Seizures   [] History of stroke   [] History of TIA  [] Aphasia   [] Vissual changes   [] Weakness or numbness in arm   [] Weakness or numbness in leg Musculoskeletal:   [] Joint swelling   [x] Joint pain   [] Low back pain Hematologic:  [] Easy bruising  [] Easy bleeding   [] Hypercoagulable state   [] Anemic Gastrointestinal:  [] Diarrhea   [] Vomiting  [x] Gastroesophageal reflux/heartburn   [] Difficulty swallowing. Genitourinary:  [] Chronic kidney disease   [] Difficult urination  [] Frequent urination   [] Blood in urine Skin:  [] Rashes   [] Ulcers  Psychological:  [] History of anxiety   []  History of major depression.  Physical Examination  There were no vitals filed for this visit. There is no height or weight on file to calculate BMI. Gen: WD/WN, NAD Head: [place]/AT, No temporalis wasting.  Ear/Nose/Throat: Hearing grossly intact, nares w/o erythema or drainage Eyes: PER, EOMI, sclera nonicteric.  Neck: Supple, no masses.  No bruit or JVD.  Pulmonary:  Good air movement, no audible wheezing, no use of accessory muscles.  Cardiac: RRR, normal S1, S2, no Murmurs. Vascular:  Vessel Right Left  Radial Palpable Palpable  Gastrointestinal: soft, non-distended. No guarding/no peritoneal  signs.  Musculoskeletal: M/S 5/5 throughout.  No visible deformity.  Neurologic: CN 2-12 intact. Pain and light touch intact in extremities.  Symmetrical.  Speech is fluent. Motor exam as listed above. Psychiatric: Judgment intact, Mood & affect appropriate for pt's clinical situation. Dermatologic: No rashes or ulcers noted.  No changes consistent with cellulitis.   CBC Lab Results  Component Value Date   WBC 7.5 03/25/2024   HGB 12.3 03/25/2024   HCT 38.5 03/25/2024   MCV 84.6 03/25/2024   PLT 333 03/25/2024  BMET    Component Value Date/Time   NA 139 03/25/2024 1428   NA 139 07/19/2021 0951   K 4.1 03/25/2024 1428   CL 104 03/25/2024 1428   CO2 27 03/25/2024 1428   GLUCOSE 89 03/25/2024 1428   BUN 9 03/25/2024 1428   BUN 10 07/19/2021 0951   CREATININE 0.98 03/25/2024 1428   CALCIUM  9.7 03/25/2024 1428   GFRNONAA >60 07/08/2022 0436   GFRNONAA 104 11/15/2020 0922   GFRAA 121 11/15/2020 0922   CrCl cannot be calculated (Patient's most recent lab result is older than the maximum 21 days allowed.).  COAG Lab Results  Component Value Date   INR 1.3 (H) 03/30/2022    Radiology No results found.   Assessment/Plan 1. Splenic artery aneurysm (HCC) (Primary) No surgery or intervention at this time.   She is status post successful coil embolization of her splenic artery aneurysm and has roughly 50% of her splenic mass persisting.  Incidentally, I suspect the elevation in velocities at the origin of the celiac are artifact given the CT results from 2024 which demonstrated a widely patent celiac artery without stenosis.   The patient will return in 12 months with an mesenteric  duplex.   - VAS US  MESENTERIC; Future  2. Mild intermittent asthma without complication Continue pulmonary medications and aerosols as already ordered, these medications have been reviewed and there are no changes at this time.   3. Gastro-esophageal reflux disease without  esophagitis Continue PPI as already ordered, this medication has been reviewed and there are no changes at this time.  Avoidence of caffeine  and alcohol  Moderate elevation of the head of the bed     Cordella Shawl, MD  05/29/2024 3:20 PM

## 2024-05-30 ENCOUNTER — Encounter (INDEPENDENT_AMBULATORY_CARE_PROVIDER_SITE_OTHER): Payer: Self-pay | Admitting: Vascular Surgery

## 2024-05-30 ENCOUNTER — Ambulatory Visit (INDEPENDENT_AMBULATORY_CARE_PROVIDER_SITE_OTHER): Payer: BC Managed Care – PPO

## 2024-05-30 ENCOUNTER — Ambulatory Visit (INDEPENDENT_AMBULATORY_CARE_PROVIDER_SITE_OTHER): Payer: BC Managed Care – PPO | Admitting: Vascular Surgery

## 2024-05-30 VITALS — BP 113/78 | HR 76 | Resp 18 | Wt 179.0 lb

## 2024-05-30 DIAGNOSIS — J4520 Mild intermittent asthma, uncomplicated: Secondary | ICD-10-CM | POA: Diagnosis not present

## 2024-05-30 DIAGNOSIS — K219 Gastro-esophageal reflux disease without esophagitis: Secondary | ICD-10-CM

## 2024-05-30 DIAGNOSIS — I728 Aneurysm of other specified arteries: Secondary | ICD-10-CM

## 2024-06-08 ENCOUNTER — Ambulatory Visit
Admission: RE | Admit: 2024-06-08 | Discharge: 2024-06-08 | Disposition: A | Discharge status: Home / Self Care | Source: Ambulatory Visit | Attending: Family Medicine | Admitting: Family Medicine

## 2024-06-08 DIAGNOSIS — Z1231 Encounter for screening mammogram for malignant neoplasm of breast: Secondary | ICD-10-CM | POA: Insufficient documentation

## 2024-06-13 ENCOUNTER — Ambulatory Visit: Admitting: Family Medicine

## 2024-06-17 ENCOUNTER — Ambulatory Visit: Admitting: Family Medicine

## 2024-06-17 ENCOUNTER — Encounter: Payer: Self-pay | Admitting: Family Medicine

## 2024-06-17 VITALS — BP 122/70 | HR 90 | Resp 16 | Ht 63.0 in | Wt 178.0 lb

## 2024-06-17 DIAGNOSIS — G8929 Other chronic pain: Secondary | ICD-10-CM | POA: Diagnosis not present

## 2024-06-17 DIAGNOSIS — M25571 Pain in right ankle and joints of right foot: Secondary | ICD-10-CM | POA: Diagnosis not present

## 2024-06-17 MED ORDER — MELOXICAM 7.5 MG PO TABS: 7.5000 mg | ORAL_TABLET | Freq: Every day | ORAL | 2 refills | Status: DC

## 2024-06-17 NOTE — Progress Notes (Signed)
 Patient ID: Sheri Malone, female    DOB: Dec 09, 1985, 38 y.o.   MRN: [id number]  PCP: Leavy Mole, PA-C  Chief Complaint  Patient presents with   Joint Swelling    R ankle swelling w/stiffness. Needing updated handicap form    Subjective:   Sheri Malone is a 38 y.o. female, presents to clinic with CC of the following:  HPI  Right ankle pain and swelling post trauma and surgery we will take over management from Dr. Kit who was doing check ups, signing handicap form and had recently give pt mobic  which helps a little but causes drowsiness Colder weather and first thing in the am pain and stiffness is worse    Patient Active Problem List   Diagnosis Date Noted   Superior mesenteric artery stenosis (HCC) 05/11/2023   S/P gastric bypass 07/07/2022   S/P coil embolization of splenic artery aneurysm 03/25/22 03/30/2022   Splenic artery aneurysm (HCC) 08/06/2021   Slow transit constipation 11/15/2020   Gastro-esophageal reflux disease without esophagitis 04/13/2020   Vitamin D  deficiency 01/20/2020   Prediabetes 01/20/2020   Allergic rhinitis 11/23/2016   Allergic conjunctivitis 01/17/2016   Mild intermittent asthma 01/17/2016   Dermatitis, eczematoid 06/21/2015   H/O cesarean section 06/21/2015   Headache, migraine 06/21/2015      Current Outpatient Medications:    albuterol  (PROVENTIL  HFA;VENTOLIN  HFA) 108 (90 Base) MCG/ACT inhaler, Inhale 1 puff into the lungs every 6 (six) hours as needed for wheezing or shortness of breath., Disp: , Rfl:    azelastine  (ASTELIN ) 0.1 % nasal spray, Place 1 spray into both nostrils daily., Disp: , Rfl:    azelastine  (OPTIVAR ) 0.05 % ophthalmic solution, Place 1 drop into both eyes daily as needed (Allergies)., Disp: , Rfl:    EPINEPHrine  0.3 mg/0.3 mL IJ SOAJ injection, Inject 0.3 mg into the muscle as needed for anaphylaxis., Disp: , Rfl:    fluticasone  (FLONASE ) 50 MCG/ACT nasal spray, Place 1 spray into the nose daily as needed  for allergies., Disp: , Rfl:    levocetirizine (XYZAL) 5 MG tablet, Take 5 mg by mouth every evening., Disp: , Rfl:    meclizine  (ANTIVERT ) 25 MG tablet, Take 12.5-25 mg by mouth 3 (three) times daily as needed for dizziness., Disp: , Rfl:    montelukast (SINGULAIR) 10 MG tablet, Take 10 mg by mouth at bedtime., Disp: , Rfl:    Norethindrone  Acetate-Ethinyl Estrad-FE (HAILEY  24 FE) 1-20 MG-MCG(24) tablet, TAKE 1 TABLET BY MOUTH DAILY. TAKE HORMONAL PILL CONTINUOUSLY, Disp: 84 tablet, Rfl: 5   ondansetron  (ZOFRAN ) 4 MG tablet, Take 1 tablet (4 mg total) by mouth every 8 (eight) hours as needed for nausea or vomiting., Disp: 20 tablet, Rfl: 0   pantoprazole  (PROTONIX ) 40 MG tablet, Take 1 tablet (40 mg total) by mouth daily., Disp: 90 tablet, Rfl: 1   SYMBICORT  160-4.5 MCG/ACT inhaler, INHALE 2 PUFFS INTO THE LUNGS IN THE MORNING AND AT BEDTIME., Disp: 10.2 each, Rfl: 1   Allergies  Allergen Reactions   Dust Mite Mixed Allergen Ext [Mite (D. Farinae)] Anaphylaxis   Other Anaphylaxis and Other (See Comments)    DUST ROACH   Shellfish Allergy      Social History   Tobacco Use   Smoking status: Never   Smokeless tobacco: Never  Vaping Use   Vaping status: Never Used  Substance Use Topics   Alcohol use: Yes    Comment: occasionally/once a month   Drug use: No  Chart Review Today: I personally reviewed active problem list, medication list, allergies, family history, social history, health maintenance, notes from last encounter, lab results, imaging with the patient/caregiver today.   Review of Systems  All other systems reviewed and are negative.      Objective:   Vitals:   06/17/24 0845  BP: 122/70  Pulse: 90  Resp: 16  SpO2: 98%  Weight: 178 lb (80.7 kg)  Height: 5' 3 (1.6 m)    Body mass index is 31.53 kg/m.  Physical Exam Vitals and nursing note reviewed.  Constitutional:      General: She is not in acute distress.    Appearance: Normal appearance. She  is well-developed. She is not ill-appearing, toxic-appearing or diaphoretic.  HENT:     Head: Normocephalic and atraumatic.     Right Ear: External ear normal.     Left Ear: External ear normal.     Nose: Nose normal.  Eyes:     General: No scleral icterus.       Right eye: No discharge.        Left eye: No discharge.     Conjunctiva/sclera: Conjunctivae normal.  Neck:     Trachea: No tracheal deviation.  Cardiovascular:     Rate and Rhythm: Normal rate.  Pulmonary:     Effort: Pulmonary effort is normal. No respiratory distress.     Breath sounds: No stridor.  Musculoskeletal:     Comments: Right ankle with medial ankle scar and generalized swelling, no erythema  Skin:    General: Skin is warm and dry.     Findings: No rash.  Neurological:     Mental Status: She is alert.     Motor: No abnormal muscle tone.     Coordination: Coordination normal.     Gait: Gait normal.  Psychiatric:        Mood and Affect: Mood normal.        Behavior: Behavior normal.           Assessment & Plan:    1. Chronic pain of right ankle (Primary) Chronic ankle pain and swelling limiting mobility and function x 2 years since traumatic fx and surgery No current planned interventions by ortho Dr. Kit PCP will take over NSAID and handicap placard forms as needed Handicap form done today - meloxicam  (MOBIC ) 7.5 MG tablet; Take 1 tablet (7.5 mg total) by mouth daily.  Dispense: 90 tablet; Refill: 2     Michelene Cower, PA-C 06/17/24 9:00 AM

## 2024-07-02 DIAGNOSIS — J019 Acute sinusitis, unspecified: Secondary | ICD-10-CM | POA: Diagnosis not present

## 2024-08-03 DIAGNOSIS — F4323 Adjustment disorder with mixed anxiety and depressed mood: Secondary | ICD-10-CM | POA: Diagnosis not present

## 2024-08-12 DIAGNOSIS — F4320 Adjustment disorder, unspecified: Secondary | ICD-10-CM | POA: Diagnosis not present

## 2024-08-12 DIAGNOSIS — F4310 Post-traumatic stress disorder, unspecified: Secondary | ICD-10-CM | POA: Diagnosis not present

## 2024-08-12 DIAGNOSIS — G4700 Insomnia, unspecified: Secondary | ICD-10-CM | POA: Diagnosis not present

## 2024-08-17 DIAGNOSIS — G4700 Insomnia, unspecified: Secondary | ICD-10-CM | POA: Diagnosis not present

## 2024-08-17 DIAGNOSIS — F411 Generalized anxiety disorder: Secondary | ICD-10-CM | POA: Diagnosis not present

## 2024-08-17 DIAGNOSIS — F4310 Post-traumatic stress disorder, unspecified: Secondary | ICD-10-CM | POA: Diagnosis not present

## 2024-08-17 DIAGNOSIS — F4320 Adjustment disorder, unspecified: Secondary | ICD-10-CM | POA: Diagnosis not present

## 2024-08-25 DIAGNOSIS — F4323 Adjustment disorder with mixed anxiety and depressed mood: Secondary | ICD-10-CM | POA: Diagnosis not present

## 2024-09-09 ENCOUNTER — Encounter (INDEPENDENT_AMBULATORY_CARE_PROVIDER_SITE_OTHER): Payer: Self-pay | Admitting: Vascular Surgery

## 2024-09-14 DIAGNOSIS — G4700 Insomnia, unspecified: Secondary | ICD-10-CM | POA: Diagnosis not present

## 2024-09-14 DIAGNOSIS — F4310 Post-traumatic stress disorder, unspecified: Secondary | ICD-10-CM | POA: Diagnosis not present

## 2024-09-14 DIAGNOSIS — F411 Generalized anxiety disorder: Secondary | ICD-10-CM | POA: Diagnosis not present

## 2024-09-28 DIAGNOSIS — F4323 Adjustment disorder with mixed anxiety and depressed mood: Secondary | ICD-10-CM | POA: Diagnosis not present

## 2024-10-05 DIAGNOSIS — F4323 Adjustment disorder with mixed anxiety and depressed mood: Secondary | ICD-10-CM | POA: Diagnosis not present

## 2024-10-18 DIAGNOSIS — M25561 Pain in right knee: Secondary | ICD-10-CM | POA: Diagnosis not present

## 2024-10-18 DIAGNOSIS — F4323 Adjustment disorder with mixed anxiety and depressed mood: Secondary | ICD-10-CM | POA: Diagnosis not present

## 2024-10-28 ENCOUNTER — Other Ambulatory Visit: Payer: Self-pay | Admitting: Family Medicine

## 2024-10-28 DIAGNOSIS — K219 Gastro-esophageal reflux disease without esophagitis: Secondary | ICD-10-CM

## 2024-10-28 NOTE — Telephone Encounter (Signed)
 Copied from CRM [id number]. Topic: Clinical - Medication Refill >> Oct 28, 2024  2:52 PM Pinkey ORN wrote: Medication: pantoprazole  (PROTONIX ) 40 MG tablet  Has the patient contacted their pharmacy? Yes (Agent: If no, request that the patient contact the pharmacy for the refill. If patient does not wish to contact the pharmacy document the reason why and proceed with request.) (Agent: If yes, when and what did the pharmacy advise?)  This is the patient's preferred pharmacy:  [facility] [address] [place] - [address] [place] [address] Phone: [phone] Fax: [phone]  Is this the correct pharmacy for this prescription? Yes If no, delete pharmacy and type the correct one.   Has the prescription been filled recently? No  Is the patient out of the medication? Yes  Has the patient been seen for an appointment in the last year OR does the patient have an upcoming appointment? Yes  Can we respond through MyChart? Yes  Agent: Please be advised that Rx refills may take up to 3 business days. We ask that you follow-up with your pharmacy. >> Oct 28, 2024  2:55 PM Pinkey ORN wrote: Patient states the pharmacy has been trying to get in contact with Tapia, Leisa, PA-C in regards to her medication, but isn't understanding that she's currently out on leave.

## 2024-11-11 ENCOUNTER — Ambulatory Visit (INDEPENDENT_AMBULATORY_CARE_PROVIDER_SITE_OTHER): Admitting: Family Medicine

## 2024-11-11 ENCOUNTER — Encounter: Payer: Self-pay | Admitting: Family Medicine

## 2024-11-11 VITALS — BP 126/80 | HR 92 | Resp 16 | Ht 63.0 in | Wt 178.0 lb

## 2024-11-11 DIAGNOSIS — M25571 Pain in right ankle and joints of right foot: Secondary | ICD-10-CM

## 2024-11-11 DIAGNOSIS — E559 Vitamin D deficiency, unspecified: Secondary | ICD-10-CM

## 2024-11-11 DIAGNOSIS — G8929 Other chronic pain: Secondary | ICD-10-CM | POA: Diagnosis not present

## 2024-11-11 DIAGNOSIS — Z0000 Encounter for general adult medical examination without abnormal findings: Secondary | ICD-10-CM

## 2024-11-11 DIAGNOSIS — J309 Allergic rhinitis, unspecified: Secondary | ICD-10-CM | POA: Diagnosis not present

## 2024-11-11 DIAGNOSIS — G43009 Migraine without aura, not intractable, without status migrainosus: Secondary | ICD-10-CM

## 2024-11-11 DIAGNOSIS — F419 Anxiety disorder, unspecified: Secondary | ICD-10-CM | POA: Diagnosis not present

## 2024-11-11 DIAGNOSIS — R7303 Prediabetes: Secondary | ICD-10-CM | POA: Diagnosis not present

## 2024-11-11 DIAGNOSIS — K219 Gastro-esophageal reflux disease without esophagitis: Secondary | ICD-10-CM | POA: Diagnosis not present

## 2024-11-11 DIAGNOSIS — J4520 Mild intermittent asthma, uncomplicated: Secondary | ICD-10-CM | POA: Diagnosis not present

## 2024-11-11 MED ORDER — PANTOPRAZOLE SODIUM 40 MG PO TBEC: 40.0000 mg | DELAYED_RELEASE_TABLET | Freq: Every day | ORAL | 2 refills | Status: AC | PRN

## 2024-11-11 NOTE — Assessment & Plan Note (Addendum)
 Asthma controlled with current regimen, Symbicort  and Albuterol . Asthma managed by allergy/asthma specialist.   -Continue following with specialist for management of asthma

## 2024-11-11 NOTE — Progress Notes (Signed)
 "  Established Patient Office Visit  Subjective   Patient ID: Sheri Malone, female    DOB: February 11, 1986  Age: 39 y.o. MRN: [id number]  Chief Complaint  Patient presents with   Medication Refill    HPI Sheri Malone is a pleasant 39 year old female who presents today for medication refill. She is a new patient to me, though well established in office. She requests a refill on Pantoprazole . She voices since she has been without Pantoprazole  for the last one to two months GERD has been uncontrolled, describing heartburn. She has been trying to manage with OTC Tums. She voices when taking Pantoprazole  GERD is well controlled.   She voices she broke her right ankle 4 to 5 years ago and since then she has intermittent swelling of the right ankle and describes stiffness of the right ankle. Pain is intermittent. She has had surgeries on the right ankle in the past. She is established with ortho, [facility] in [place], and gets injections in her ankle typically every 6 months. She voices injections do improve pain in the right ankle.  She has began following with [facility] in [facility] for right knee pain, voicing she was diagnosed with right knee OA.  She is requesting a handicap placard today due to pain and orthopedic conditions as described. I am agreeable. She voices her current handicap placard expires in February of this year.   She reports she is established with an allergy/asthma specialist for management of allergic rhinitis and asthma. She voices he prescribes her Montelukast, Symbicort , Albuterol , Azelastine  nasal spray, Azelastine  ophthalmic solution, Flonase  nasal spray, and epi pen. She reports allergic rhinitis and asthma are controlled with current medication regimen. Denies shortness of breath.   She is also established with neurology, voicing through [facility]. Migraines managed without medication therapy. She voices migraines are infrequent, stating only 3 to 4 yearly. She  voices migraines use to be with her cycle, however since starting the OCP migraines have been better. She voices if she gets a migraine she can manage with Tylenol  or Ibuprofen .     She has began to follow with psychiatry for management of anxiety starting October 2025. She voices she has started Lexapro 10mg  daily, taking 1/2 tablet BID. She voices this has done well for her anxiety.   LMP was February last year, voicing she does not typically have a menstrual period with current OCP. She voices if she does have a menstrual period she states vaginal bleeding is very light. She voices she does not need refills at this time.     Review of Systems  Respiratory:  Negative for shortness of breath.   Cardiovascular:  Negative for chest pain.  Gastrointestinal:  Positive for heartburn. Negative for abdominal pain, blood in stool, constipation, diarrhea, nausea and vomiting.  Musculoskeletal:  Positive for joint pain.      Objective:     BP 126/80   Pulse 92   Resp 16   Ht 5' 3 (1.6 m)   Wt 178 lb (80.7 kg)   SpO2 99%   BMI 31.53 kg/m  BP Readings from Last 3 Encounters:  11/11/24 126/80  06/17/24 122/70  05/30/24 113/78   Wt Readings from Last 3 Encounters:  11/11/24 178 lb (80.7 kg)  06/17/24 178 lb (80.7 kg)  05/30/24 179 lb (81.2 kg)   SpO2 Readings from Last 3 Encounters:  11/11/24 99%  06/17/24 98%  03/25/24 98%      Physical Exam Constitutional:  Appearance: Normal appearance.  Cardiovascular:     Rate and Rhythm: Normal rate and regular rhythm.     Heart sounds: Normal heart sounds.  Pulmonary:     Effort: Pulmonary effort is normal.     Breath sounds: Normal breath sounds. No wheezing, rhonchi or rales.  Skin:    General: Skin is warm and dry.  Neurological:     Mental Status: She is alert and oriented to person, place, and time.  Psychiatric:        Mood and Affect: Mood normal.        Behavior: Behavior normal.     Last CBC Lab Results   Component Value Date   WBC 7.5 03/25/2024   HGB 12.3 03/25/2024   HCT 38.5 03/25/2024   MCV 84.6 03/25/2024   MCH 27.0 03/25/2024   RDW 12.4 03/25/2024   PLT 333 03/25/2024   Last metabolic panel Lab Results  Component Value Date   GLUCOSE 89 03/25/2024   NA 139 03/25/2024   K 4.1 03/25/2024   CL 104 03/25/2024   CO2 27 03/25/2024   BUN 9 03/25/2024   CREATININE 0.98 03/25/2024   EGFR 75 03/25/2024   CALCIUM  9.7 03/25/2024   PROT 6.8 03/25/2024   ALBUMIN 3.3 (L) 07/08/2022   BILITOT 0.5 03/25/2024   ALKPHOS 23 (L) 07/08/2022   AST 13 03/25/2024   ALT 11 03/25/2024   ANIONGAP 8 07/08/2022   Last lipids Lab Results  Component Value Date   CHOL 157 03/25/2024   HDL 66 03/25/2024   LDLCALC 71 03/25/2024   TRIG 122 03/25/2024   CHOLHDL 2.4 03/25/2024   Last hemoglobin A1c Lab Results  Component Value Date   HGBA1C 5.6 03/25/2024   Last thyroid  functions Lab Results  Component Value Date   TSH 0.73 03/25/2024   Last vitamin D  Lab Results  Component Value Date   VD25OH 48 03/25/2024         Assessment & Plan:   Assessment & Plan Gastro-esophageal reflux disease without esophagitis GERD managed with Pantoprazole  40mg  daily. GERD symptoms controlled with PPI therapy.    -Continue Pantoprazole  40mg  daily PRN, refill provided  Orders:   pantoprazole  (PROTONIX ) 40 MG tablet; Take 1 tablet (40 mg total) by mouth daily as needed.  Mild intermittent asthma without complication Asthma controlled with current regimen, Symbicort  and Albuterol . Asthma managed by allergy/asthma specialist.   -Continue following with specialist for management of asthma    Allergic rhinitis, unspecified seasonality, unspecified trigger Allergic rhinitis symptoms controlled with current regimen. Allergic rhinitis managed by allergy/asthma specialist.   -Continue following with specialist for management of allergic rhinitis     Anxiety Anxiety managed by psychiatry. Patient  started on Lexapro 10mg  daily where she takes 1/2 tablet BID. She reports anxiety well controlled with current regimen.  -Continue following with psychiatry for management of anxiety     Vitamin D  deficiency Vitamin D  deficiency controlled, last vitamin D  level 48 on 03/25/24    Prediabetes Prediabetes controlled, last A1c 5.6 in 03/2024    Migraine without aura and without status migrainosus, not intractable Migraines well controlled. Patient reports 3 to 4 migraines yearly. Migraines managed with OTC abortive therapies, Tylenol  or Ibuprofen . She is established with neurology.  -Advised to maintain follow up with neurology for management of migraines     Chronic pain of right ankle Continue following with ortho for management of right ankle pain. See HPI for details.  Will provide handicap placard as requested by  patient.     Healthcare maintenance -Pap UTD. NIL, completed 03/2024 -Mammogram currently UTD, completed 05/2024. Negative, recommended 1 year follow up (05/2025) -Schedule 6 month follow up for annual physical         Return in about 6 months (around 05/11/2025) for annual physical with fasting labs .    LAYMON LOISE CORE, FNP "

## 2024-11-11 NOTE — Assessment & Plan Note (Addendum)
 Allergic rhinitis symptoms controlled with current regimen. Allergic rhinitis managed by allergy/asthma specialist.   -Continue following with specialist for management of allergic rhinitis

## 2024-11-11 NOTE — Assessment & Plan Note (Addendum)
 Migraines well controlled. Patient reports 3 to 4 migraines yearly. Migraines managed with OTC abortive therapies, Tylenol  or Ibuprofen . She is established with neurology.  -Advised to maintain follow up with neurology for management of migraines

## 2024-11-11 NOTE — Assessment & Plan Note (Addendum)
 Prediabetes controlled, last A1c 5.6 in 03/2024

## 2024-11-11 NOTE — Assessment & Plan Note (Addendum)
 Vitamin D  deficiency controlled, last vitamin D  level 48 on 03/25/24

## 2024-11-11 NOTE — Assessment & Plan Note (Addendum)
 GERD managed with Pantoprazole  40mg  daily. GERD symptoms controlled with PPI therapy.    -Continue Pantoprazole  40mg  daily PRN, refill provided  Orders:   pantoprazole  (PROTONIX ) 40 MG tablet; Take 1 tablet (40 mg total) by mouth daily as needed.

## 2024-11-16 ENCOUNTER — Other Ambulatory Visit: Payer: Self-pay

## 2024-11-16 ENCOUNTER — Ambulatory Visit
Admission: EM | Admit: 2024-11-16 | Discharge: 2024-11-16 | Disposition: A | Discharge status: Home / Self Care | Attending: Emergency Medicine | Admitting: Emergency Medicine

## 2024-11-16 DIAGNOSIS — J0100 Acute maxillary sinusitis, unspecified: Secondary | ICD-10-CM

## 2024-11-16 DIAGNOSIS — R030 Elevated blood-pressure reading, without diagnosis of hypertension: Secondary | ICD-10-CM | POA: Diagnosis not present

## 2024-11-16 MED ORDER — AMOXICILLIN-POT CLAVULANATE 875-125 MG PO TABS
1.0000 | ORAL_TABLET | Freq: Two times a day (BID) | ORAL | 0 refills | Status: AC
  Filled 2024-11-16: qty 14, 7d supply, fill #0

## 2024-11-16 NOTE — ED Triage Notes (Signed)
 Sinus pain and pressure, congestion, left ear pain, sore throat x 10 days. Tried dyquil, Nyquil, alka selter cold and flu.

## 2024-11-16 NOTE — ED Provider Notes (Signed)
 " CAY RALPH PELT    CSN: [id number] Arrival date & time: 11/16/24  1556      History   Chief Complaint Chief Complaint  Patient presents with   Facial Pain   Otalgia    HPI Dorma Altman Wattley is a 39 y.o. female.  Patient presents with 10-day history of congestion, sinus pressure, postnasal drainage, runny nose, ear pain, sore throat, cough.  Treatment attempted with OTC cold and flu medications.  No fever, wheezing, shortness of breath, vomiting, diarrhea.  Her medical history includes asthma; she states she has not needed her rescue inhaler.  The history is provided by the patient and medical records.    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   S/P coil embolization of splenic artery aneurysm 03/25/22 03/30/2022   Splenic artery aneurysm 08/06/2021   Slow transit constipation 11/15/2020   Gastro-esophageal reflux disease without esophagitis 04/13/2020   Vitamin D  deficiency 01/20/2020   Prediabetes 01/20/2020   Allergic rhinitis 11/23/2016   Allergic conjunctivitis 01/17/2016   Mild intermittent asthma 01/17/2016   Dermatitis, eczematoid 06/21/2015   H/O cesarean section 06/21/2015   Headache, migraine 06/21/2015    Past Surgical History:  Procedure Laterality Date   ANKLE FRACTURE SURGERY Right    x2   CESAREAN SECTION N/A 05/20/2013   Procedure: CESAREAN SECTION;  Surgeon: Percilla Burly, MD;  Location: [facility];  Service: Obstetrics;  Laterality: N/A;  primary   CESAREAN SECTION N/A 02/04/2018   Procedure: Repeat CESAREAN SECTION;   Surgeon: Kandyce Sor, MD;  Location: [facility]TH[facility] SU[facility];  Service: Obstetrics;  Laterality: N/A;  EDD: 02/16/18   CHOLECYSTECTOMY N/A 09/28/2018   Procedure: LAPAROSCOPIC CHOLECYSTECTOMY;  Surgeon: Vernetta Berg, MD;  Location: [facility] OR;  Service: General;  Laterality: N/A;   DILATION AND CURETTAGE OF UTERUS  10/21/2007   GASTRIC ROUX-EN-Y N/A 07/07/2022   Procedure: LAPAROSCOPIC ROUX-EN-Y GASTRIC BYPASS WITH UPPER ENDOSCOPY;  Surgeon: Tanda Locus, MD;  Location: [facility];  Service: General;  Laterality: N/A;   HIATAL HERNIA REPAIR N/A 07/07/2022   Procedure: HERNIA REPAIR HIATAL;  Surgeon: Tanda Locus, MD;  Location: [facility];  Service: General;  Laterality: N/A;   SALPINGOOPHORECTOMY  10/21/2007   left   UNILATERAL SALPINGECTOMY Right 02/04/2018   Procedure: UNILATERAL SALPINGECTOMY;  Surgeon: Kandyce Sor, MD;  Location: [facility]THING SU[facility];  Service: Obstetrics;  Laterality: Right;   VISCERAL ARTERY INTERVENTION N/A 03/25/2022   Procedure: VISCERAL ARTERY INTERVENTION;  Surgeon: Jama Cordella MATSU, MD;  Location: [facility] INVASIVE CV LAB;  Service: Cardiovascular;  Laterality: N/A;   WISDOM TOOTH EXTRACTION      OB History     Gravida  3   Para  2   Term  2   Preterm  0   AB  1   Living  2      SAB  0   IAB  0   Ectopic  1   Multiple  Live Births  2            Home Medications    Prior to Admission medications  Medication Sig Start Date End Date Taking? Authorizing Provider  amoxicillin -clavulanate (AUGMENTIN ) 875-125 MG tablet Take 1 tablet by mouth every 12 (twelve) hours. 11/16/24  Yes Corlis Burnard DEL, NP  azelastine  (OPTIVAR ) 0.05 % ophthalmic solution Place 1 drop into both eyes daily as needed (Allergies). 02/25/19  Yes [provider]  escitalopram (LEXAPRO) 10 MG tablet Take 10 mg by mouth daily.   Yes [provider]  fluticasone  (FLONASE ) 50 MCG/ACT nasal spray Place 1 spray into the nose daily as needed for allergies. 10/21/21  Yes  [provider]  levocetirizine (XYZAL) 5 MG tablet Take 5 mg by mouth every evening.   Yes [provider]  montelukast (SINGULAIR) 10 MG tablet Take 10 mg by mouth at bedtime.   Yes [provider]  Norethindrone  Acetate-Ethinyl Estrad-FE (HAILEY  24 FE) 1-20 MG-MCG(24) tablet TAKE 1 TABLET BY MOUTH DAILY. TAKE HORMONAL PILL CONTINUOUSLY 03/25/24  Yes Tapia, Leisa, PA-C  pantoprazole  (PROTONIX ) 40 MG tablet Take 1 tablet (40 mg total) by mouth daily as needed. 11/11/24  Yes Wall, Laymon SAILOR, FNP  SYMBICORT  160-4.5 MCG/ACT inhaler INHALE 2 PUFFS INTO THE LUNGS IN THE MORNING AND AT BEDTIME. 07/10/21  Yes Tapia, Leisa, PA-C  albuterol  (PROVENTIL  HFA;VENTOLIN  HFA) 108 (90 Base) MCG/ACT inhaler Inhale 1 puff into the lungs every 6 (six) hours as needed for wheezing or shortness of breath.    [provider]  azelastine  (ASTELIN ) 0.1 % nasal spray Place 1 spray into both nostrils daily. 02/25/19   [provider]  EPINEPHrine  0.3 mg/0.3 mL IJ SOAJ injection Inject 0.3 mg into the muscle as needed for anaphylaxis.    [provider]  meclizine  (ANTIVERT ) 25 MG tablet Take 12.5-25 mg by mouth 3 (three) times daily as needed for dizziness. 05/24/22   [provider]  ondansetron  (ZOFRAN ) 4 MG tablet Take 1 tablet (4 mg total) by mouth every 8 (eight) hours as needed for nausea or vomiting. 07/08/22   Tanda Locus, MD    Family History Family History  Problem Relation Age of Onset   Migraines Mother    Hypertension Mother    Breast cancer Mother 78   Stroke Father    Diabetes Father    Hypertension Father    Multiple sclerosis Sister    Cancer Maternal Grandmother    Cancer Maternal Grandfather        prostate   Heart disease Paternal Grandfather    Stomach cancer Neg Hx    Rectal cancer Neg Hx    Esophageal cancer Neg Hx    Colon cancer Neg Hx     Social History Social History[1]   Allergies   Dust mite mixed allergen ext [mite (d.  farinae)], Other, and Shellfish allergy   Review of Systems Review of Systems  Constitutional:  Negative for chills and fever.  HENT:  Positive for congestion, ear pain, postnasal drip, rhinorrhea, sinus pressure and sore throat.   Respiratory:  Positive for cough. Negative for shortness of breath and wheezing.   Gastrointestinal:  Negative for diarrhea and vomiting.     Physical Exam Triage Vital Signs ED Triage Vitals  Encounter Vitals Group     BP      Girls Systolic BP Percentile      Girls Diastolic BP Percentile      Boys Systolic BP Percentile  Boys Diastolic BP Percentile      Pulse      Resp      Temp      Temp src      SpO2      Weight      Height      Head Circumference      Peak Flow      Pain Score      Pain Loc      Pain Education      Exclude from Growth Chart    No data found.  Updated Vital Signs BP 128/87 (BP Location: Right Arm)   Pulse 74   Temp 98 F (36.7 C) (Oral)   Resp 16   LMP  (LMP Unknown)   SpO2 99%   Visual Acuity Right Eye Distance:   Left Eye Distance:   Bilateral Distance:    Right Eye Near:   Left Eye Near:    Bilateral Near:     Physical Exam Constitutional:      General: She is not in acute distress. HENT:     Right Ear: Tympanic membrane normal.     Left Ear: Tympanic membrane normal.     Nose: Congestion and rhinorrhea present.     Mouth/Throat:     Mouth: Mucous membranes are moist.     Pharynx: Oropharynx is clear.  Cardiovascular:     Rate and Rhythm: Normal rate and regular rhythm.     Heart sounds: Normal heart sounds.  Pulmonary:     Effort: Pulmonary effort is normal. No respiratory distress.     Breath sounds: Normal breath sounds.  Neurological:     Mental Status: She is alert.      UC Treatments / Results  Labs (all labs ordered are listed, but only abnormal results are displayed) Labs Reviewed - No data to display  EKG   Radiology No results found.  Procedures Procedures  (including critical care time)  Medications Ordered in UC Medications - No data to display  Initial Impression / Assessment and Plan / UC Course  I have reviewed the triage vital signs and the nursing notes.  Pertinent labs & imaging results that were available during my care of the patient were reviewed by me and considered in my medical decision making (see chart for details).    Acute sinusitis, elevated blood pressure reading.  Afebrile and vital signs are stable.  Blood pressure was slightly elevated on arrival at 145/93.  Patient has no history of hypertension and her blood pressure was rechecked at 128/87.  Instructed her to have this rechecked by her PCP.  Treating her sinus infection with Augmentin .  Tylenol  or ibuprofen  as needed, plain Mucinex as needed.  Education provided on sinus infection.  Instructed her to follow-up with her PCP.  She agrees to plan of care.  Final Clinical Impressions(s) / UC Diagnoses   Final diagnoses:  Acute non-recurrent maxillary sinusitis  Elevated blood pressure reading     Discharge Instructions      Take the Augmentin  as directed for your sinus infection.    Your blood pressure was elevated today at 145/93.  Please have this rechecked by your primary care provider.     ED Prescriptions     Medication Sig Dispense Auth. Provider   amoxicillin -clavulanate (AUGMENTIN ) 875-125 MG tablet Take 1 tablet by mouth every 12 (twelve) hours. 14 tablet Corlis Burnard DEL, NP      PDMP not reviewed this encounter.    [  1]  Social History Tobacco Use   Smoking status: Never   Smokeless tobacco: Never  Vaping Use   Vaping status: Never Used  Substance Use Topics   Alcohol use: Yes    Comment: occasionally/once a month   Drug use: No     Corlis Burnard DEL, NP 11/16/24 1831  "

## 2024-11-16 NOTE — Discharge Instructions (Addendum)
 Take the Augmentin  as directed for your sinus infection.    Your blood pressure was elevated today at 145/93.  Please have this rechecked by your primary care provider.

## 2025-03-27 ENCOUNTER — Encounter: Admitting: Family Medicine

## 2025-05-29 ENCOUNTER — Encounter (INDEPENDENT_AMBULATORY_CARE_PROVIDER_SITE_OTHER)

## 2025-05-29 ENCOUNTER — Ambulatory Visit (INDEPENDENT_AMBULATORY_CARE_PROVIDER_SITE_OTHER): Admitting: Vascular Surgery
